# Patient Record
Sex: Female | Born: 1960 | Race: Black or African American | Hispanic: No | Marital: Married | Smoking: Never smoker
Health system: Southern US, Community
[De-identification: ages and names within clinical notes are randomized; demographics above are authoritative.]

## PROBLEM LIST (undated history)

## (undated) DIAGNOSIS — R0602 Shortness of breath: Secondary | ICD-10-CM

## (undated) DIAGNOSIS — R42 Dizziness and giddiness: Secondary | ICD-10-CM

## (undated) DIAGNOSIS — R002 Palpitations: Secondary | ICD-10-CM

## (undated) DIAGNOSIS — T7840XA Allergy, unspecified, initial encounter: Secondary | ICD-10-CM

## (undated) DIAGNOSIS — S060XAA Concussion with loss of consciousness status unknown, initial encounter: Secondary | ICD-10-CM

## (undated) DIAGNOSIS — G43909 Migraine, unspecified, not intractable, without status migrainosus: Secondary | ICD-10-CM

## (undated) DIAGNOSIS — R7303 Prediabetes: Secondary | ICD-10-CM

## (undated) DIAGNOSIS — E559 Vitamin D deficiency, unspecified: Secondary | ICD-10-CM

## (undated) DIAGNOSIS — R569 Unspecified convulsions: Secondary | ICD-10-CM

## (undated) DIAGNOSIS — G40909 Epilepsy, unspecified, not intractable, without status epilepticus: Secondary | ICD-10-CM

## (undated) DIAGNOSIS — M549 Dorsalgia, unspecified: Secondary | ICD-10-CM

## (undated) DIAGNOSIS — K635 Polyp of colon: Secondary | ICD-10-CM

## (undated) DIAGNOSIS — S060X9A Concussion with loss of consciousness of unspecified duration, initial encounter: Secondary | ICD-10-CM

## (undated) DIAGNOSIS — R600 Localized edema: Secondary | ICD-10-CM

## (undated) DIAGNOSIS — Z9289 Personal history of other medical treatment: Secondary | ICD-10-CM

## (undated) DIAGNOSIS — I10 Essential (primary) hypertension: Secondary | ICD-10-CM

## (undated) DIAGNOSIS — O99350 Diseases of the nervous system complicating pregnancy, unspecified trimester: Secondary | ICD-10-CM

## (undated) DIAGNOSIS — F419 Anxiety disorder, unspecified: Secondary | ICD-10-CM

## (undated) DIAGNOSIS — G4730 Sleep apnea, unspecified: Secondary | ICD-10-CM

## (undated) DIAGNOSIS — J302 Other seasonal allergic rhinitis: Secondary | ICD-10-CM

## (undated) DIAGNOSIS — R51 Headache: Secondary | ICD-10-CM

## (undated) DIAGNOSIS — B019 Varicella without complication: Secondary | ICD-10-CM

## (undated) DIAGNOSIS — G4733 Obstructive sleep apnea (adult) (pediatric): Secondary | ICD-10-CM

## (undated) DIAGNOSIS — R519 Headache, unspecified: Secondary | ICD-10-CM

## (undated) DIAGNOSIS — K219 Gastro-esophageal reflux disease without esophagitis: Secondary | ICD-10-CM

## (undated) DIAGNOSIS — R1310 Dysphagia, unspecified: Secondary | ICD-10-CM

## (undated) DIAGNOSIS — K5900 Constipation, unspecified: Secondary | ICD-10-CM

## (undated) DIAGNOSIS — O1490 Unspecified pre-eclampsia, unspecified trimester: Secondary | ICD-10-CM

## (undated) HISTORY — DX: Localized edema: R60.0

## (undated) HISTORY — DX: Vitamin D deficiency, unspecified: E55.9

## (undated) HISTORY — PX: TUBAL LIGATION: SHX77

## (undated) HISTORY — DX: Concussion with loss of consciousness status unknown, initial encounter: S06.0XAA

## (undated) HISTORY — DX: Headache, unspecified: R51.9

## (undated) HISTORY — DX: Other seasonal allergic rhinitis: J30.2

## (undated) HISTORY — PX: COLONOSCOPY: SHX174

## (undated) HISTORY — DX: Gastro-esophageal reflux disease without esophagitis: K21.9

## (undated) HISTORY — DX: Headache: R51

## (undated) HISTORY — DX: Diseases of the nervous system complicating pregnancy, unspecified trimester: O99.350

## (undated) HISTORY — DX: Dizziness and giddiness: R42

## (undated) HISTORY — DX: Migraine, unspecified, not intractable, without status migrainosus: G43.909

## (undated) HISTORY — DX: Anxiety disorder, unspecified: F41.9

## (undated) HISTORY — DX: Epilepsy, unspecified, not intractable, without status epilepticus: G40.909

## (undated) HISTORY — DX: Personal history of other medical treatment: Z92.89

## (undated) HISTORY — DX: Varicella without complication: B01.9

## (undated) HISTORY — PX: POLYPECTOMY: SHX149

## (undated) HISTORY — DX: Unspecified pre-eclampsia, unspecified trimester: O14.90

## (undated) HISTORY — DX: Allergy, unspecified, initial encounter: T78.40XA

## (undated) HISTORY — PX: TONSILLECTOMY: SHX5217

## (undated) HISTORY — DX: Dysphagia, unspecified: R13.10

## (undated) HISTORY — DX: Shortness of breath: R06.02

## (undated) HISTORY — DX: Essential (primary) hypertension: I10

## (undated) HISTORY — DX: Polyp of colon: K63.5

## (undated) HISTORY — DX: Unspecified convulsions: R56.9

## (undated) HISTORY — DX: Concussion with loss of consciousness of unspecified duration, initial encounter: S06.0X9A

## (undated) HISTORY — DX: Constipation, unspecified: K59.00

## (undated) HISTORY — DX: Palpitations: R00.2

## (undated) HISTORY — DX: Sleep apnea, unspecified: G47.30

## (undated) HISTORY — DX: Dorsalgia, unspecified: M54.9

## (undated) HISTORY — DX: Prediabetes: R73.03

## (undated) HISTORY — PX: TONSILLECTOMY: SUR1361

---

## 2009-01-28 LAB — HM COLONOSCOPY

## 2010-11-09 ENCOUNTER — Other Ambulatory Visit (HOSPITAL_COMMUNITY)
Admission: RE | Admit: 2010-11-09 | Discharge: 2010-11-09 | Disposition: A | Discharge status: Home / Self Care | Payer: PRIVATE HEALTH INSURANCE | Source: Ambulatory Visit | Attending: Obstetrics and Gynecology | Admitting: Obstetrics and Gynecology

## 2010-11-09 DIAGNOSIS — Z01419 Encounter for gynecological examination (general) (routine) without abnormal findings: Secondary | ICD-10-CM | POA: Insufficient documentation

## 2010-11-09 DIAGNOSIS — Z1159 Encounter for screening for other viral diseases: Secondary | ICD-10-CM | POA: Insufficient documentation

## 2011-01-06 ENCOUNTER — Other Ambulatory Visit: Payer: Self-pay | Admitting: Family Medicine

## 2011-01-06 DIAGNOSIS — Z1231 Encounter for screening mammogram for malignant neoplasm of breast: Secondary | ICD-10-CM

## 2011-01-13 ENCOUNTER — Ambulatory Visit
Admission: RE | Admit: 2011-01-13 | Discharge: 2011-01-13 | Disposition: A | Discharge status: Home / Self Care | Payer: PRIVATE HEALTH INSURANCE | Source: Ambulatory Visit | Attending: Family Medicine | Admitting: Family Medicine

## 2011-01-13 DIAGNOSIS — Z1231 Encounter for screening mammogram for malignant neoplasm of breast: Secondary | ICD-10-CM

## 2011-11-22 ENCOUNTER — Other Ambulatory Visit: Payer: Self-pay | Admitting: Obstetrics and Gynecology

## 2011-11-22 DIAGNOSIS — Z1231 Encounter for screening mammogram for malignant neoplasm of breast: Secondary | ICD-10-CM

## 2012-01-15 ENCOUNTER — Ambulatory Visit
Admission: RE | Admit: 2012-01-15 | Discharge: 2012-01-15 | Disposition: A | Discharge status: Home / Self Care | Payer: PRIVATE HEALTH INSURANCE | Source: Ambulatory Visit | Attending: Obstetrics and Gynecology | Admitting: Obstetrics and Gynecology

## 2012-01-15 DIAGNOSIS — Z1231 Encounter for screening mammogram for malignant neoplasm of breast: Secondary | ICD-10-CM

## 2012-10-09 ENCOUNTER — Other Ambulatory Visit: Payer: Self-pay | Admitting: Sports Medicine

## 2012-10-09 ENCOUNTER — Ambulatory Visit
Admission: RE | Admit: 2012-10-09 | Discharge: 2012-10-09 | Disposition: A | Discharge status: Home / Self Care | Payer: BC Managed Care – PPO | Source: Ambulatory Visit | Attending: Sports Medicine | Admitting: Sports Medicine

## 2012-10-09 DIAGNOSIS — R51 Headache: Secondary | ICD-10-CM

## 2012-10-28 LAB — HM MAMMOGRAPHY: HM Mammogram: NORMAL

## 2012-10-28 LAB — HM PAP SMEAR: HM Pap smear: NORMAL

## 2012-11-04 ENCOUNTER — Other Ambulatory Visit: Payer: Self-pay | Admitting: Neurology

## 2012-11-04 DIAGNOSIS — M542 Cervicalgia: Secondary | ICD-10-CM

## 2012-11-04 DIAGNOSIS — S0993XA Unspecified injury of face, initial encounter: Secondary | ICD-10-CM

## 2012-11-04 DIAGNOSIS — R200 Anesthesia of skin: Secondary | ICD-10-CM

## 2012-11-09 ENCOUNTER — Other Ambulatory Visit: Payer: BC Managed Care – PPO

## 2012-11-15 ENCOUNTER — Telehealth: Payer: Self-pay | Admitting: Internal Medicine

## 2012-11-15 NOTE — Telephone Encounter (Signed)
Pt called stating that Dr Clelia Croft @ [facility] wanted pt to sent up new pt appointment with you. Pt has bcbs Please advise if i can set her up as a new pt.   Offered pt new patient appointment with Dr Lorin Picket in Sept

## 2012-11-15 NOTE — Telephone Encounter (Signed)
Yes. I will see her.

## 2012-11-21 NOTE — Telephone Encounter (Signed)
Appointment 8/12 pt aware

## 2012-11-23 ENCOUNTER — Ambulatory Visit
Admission: RE | Admit: 2012-11-23 | Discharge: 2012-11-23 | Disposition: A | Discharge status: Home / Self Care | Payer: BC Managed Care – PPO | Source: Ambulatory Visit | Attending: Neurology | Admitting: Neurology

## 2012-11-23 DIAGNOSIS — R200 Anesthesia of skin: Secondary | ICD-10-CM

## 2012-11-23 DIAGNOSIS — M542 Cervicalgia: Secondary | ICD-10-CM

## 2012-11-23 DIAGNOSIS — S0993XA Unspecified injury of face, initial encounter: Secondary | ICD-10-CM

## 2012-12-02 ENCOUNTER — Other Ambulatory Visit: Payer: Self-pay

## 2012-12-02 DIAGNOSIS — Z1231 Encounter for screening mammogram for malignant neoplasm of breast: Secondary | ICD-10-CM

## 2013-01-15 ENCOUNTER — Ambulatory Visit
Admission: RE | Admit: 2013-01-15 | Discharge: 2013-01-15 | Disposition: A | Discharge status: Home / Self Care | Payer: BC Managed Care – PPO | Source: Ambulatory Visit

## 2013-01-15 DIAGNOSIS — Z1231 Encounter for screening mammogram for malignant neoplasm of breast: Secondary | ICD-10-CM

## 2013-01-19 LAB — HM COLONOSCOPY

## 2013-01-21 ENCOUNTER — Ambulatory Visit: Payer: BC Managed Care – PPO | Admitting: Internal Medicine

## 2013-01-28 ENCOUNTER — Encounter: Payer: Self-pay | Admitting: Internal Medicine

## 2013-01-28 ENCOUNTER — Ambulatory Visit (INDEPENDENT_AMBULATORY_CARE_PROVIDER_SITE_OTHER): Payer: BC Managed Care – PPO | Admitting: Internal Medicine

## 2013-01-28 VITALS — BP 138/82 | HR 75 | Temp 98.4°F | Resp 12 | Ht 65.0 in | Wt 184.0 lb

## 2013-01-28 DIAGNOSIS — G44309 Post-traumatic headache, unspecified, not intractable: Secondary | ICD-10-CM

## 2013-01-28 DIAGNOSIS — IMO0001 Reserved for inherently not codable concepts without codable children: Secondary | ICD-10-CM

## 2013-01-28 DIAGNOSIS — S0990XS Unspecified injury of head, sequela: Secondary | ICD-10-CM | POA: Insufficient documentation

## 2013-01-28 DIAGNOSIS — I10 Essential (primary) hypertension: Secondary | ICD-10-CM

## 2013-01-28 DIAGNOSIS — N762 Acute vulvitis: Secondary | ICD-10-CM | POA: Insufficient documentation

## 2013-01-28 DIAGNOSIS — N760 Acute vaginitis: Secondary | ICD-10-CM

## 2013-01-28 DIAGNOSIS — R002 Palpitations: Secondary | ICD-10-CM

## 2013-01-28 DIAGNOSIS — M25559 Pain in unspecified hip: Secondary | ICD-10-CM | POA: Insufficient documentation

## 2013-01-28 MED ORDER — FLUCONAZOLE 150 MG PO TABS: 150.0000 mg | ORAL_TABLET | Freq: Every day | ORAL | Status: DC

## 2013-01-28 MED ORDER — NYSTATIN 100000 UNIT/GM EX CREA: TOPICAL_CREAM | Freq: Two times a day (BID) | CUTANEOUS | Status: DC

## 2013-01-28 MED ORDER — ATENOLOL 25 MG PO TABS: 25.0000 mg | ORAL_TABLET | Freq: Two times a day (BID) | ORAL | Status: DC

## 2013-01-28 NOTE — Assessment & Plan Note (Signed)
Persistent, with a FH od early CAD (father age 54 had a massive MI).  Screening lipidsordered,  No prior cardiology evaluation. Refer to [facility] Cardiology.

## 2013-01-28 NOTE — Assessment & Plan Note (Signed)
She is requesting refill on diflucan and nystatin for symptoms of white discharge and inguinal irritation that have been present for several days.

## 2013-01-28 NOTE — Progress Notes (Signed)
Patient ID: Nicole Gould, female   DOB: [birth year], 52 y.o.   MRN: [id number]   Patient Active Problem List   Diagnosis Date Noted  . Pain in joint, pelvic region and thigh 01/28/2013  . Essential hypertension, benign 01/28/2013  . Vaginitis and vulvovaginitis 01/28/2013  . Palpitations 01/28/2013  . 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 Establish Care    HPI:   Nicole Gould is a 52 y.o. female who presents as a new patient to establish primary care with the chief complaint of  Headaches.  She has a history of concussion in April and was evaluated by Dr. Sherryll Burger at [facility].  The injury occurred when she fell out of a chair and a metal bar on chair hit her in the back of head   No LOC. Has been taking NSAIDs  Including a lot of  Aleve,  Then tylenol,  Then advil. For persistnet bilateral headaches that were stabbing in nature.  Less frequent now,  Seeing a headache specialist in Sept at [facility],   History of recurrent palpitations managed with atenolol.  FH of CAD in father .  holter monitor was negative ,  Events never occurred,  no syncope bur occasional sob.  No change with caffeine restriction  Stiff joints brought on by sitting for prolonged periods of time.,  Vit d was low and treated but still present..   Hips,  Are the worst.   Bilateral index finger swelling and stiff , periodic.  X rays were normal  Seasonal chapped lips that do not respond to blistex  ,  Occurs every fall/winter,  Worse when she wakes up in the morning.       Past Medical History  Diagnosis Date  . Chicken pox   . Frequent headaches   . GERD (gastroesophageal reflux disease)   . Hypertension   . Seizure disorder in pregnancy   . Colon polyps   . Migraines     Past Surgical History  Procedure Laterality Date  . Tonsillectomy    . Tubal ligation      Family History  Problem Relation Age of Onset  . Hypertension  Mother   . Diabetes Mother   . Heart disease Father   . Kidney disease Father   . Hypertension Father   . Diabetes Father   . Cancer Maternal Grandfather     History   Social History  . Marital Status: Married    Spouse Name: N/A    Number of Children: N/A  . Years of Education: N/A   Occupational History  . Not on file.   Social History Main Topics  . Smoking status: Never Smoker   . Smokeless tobacco: Never Used  . Alcohol Use: Yes  . Drug Use: No  . Sexually Active: Yes -- Female partner(s)   Other Topics Concern  . Not on file   Social History Narrative  . No narrative on file    No Known Allergies  Review of Systems:  Patient denies headache, fevers, malaise, unintentional weight loss, skin rash, eye pain, sinus congestion and sinus pain, sore throat, dysphagia,  hemoptysis , cough, dyspnea, wheezing,  orthopnea, edema, abdominal pain, nausea, melena, diarrhea, constipation, flank pain, dysuria, hematuria, urinary  Frequency, nocturia, numbness, tingling, seizures,  Focal weakness, Loss of consciousness,  Tremor, insomnia, depression, anxiety, and suicidal ideation.       Objective:  BP 138/82  Pulse 75  Temp(Src) 98.4 F (36.9 C) (Oral)  Resp 12  Ht 5\' 5"  (1.651 m)  Wt 184 lb (83.462 kg)  BMI 30.62 kg/m2  SpO2 99%  LMP 01/08/2013  General appearance: alert, cooperative and appears stated age Ears: normal TM's and external ear canals both ears Throat: lips, mucosa, and tongue normal; teeth and gums normal Neck: no adenopathy, no carotid bruit, supple, symmetrical, trachea midline and thyroid not enlarged, symmetric, no tenderness/mass/nodules Back: symmetric, no curvature. ROM normal. No CVA tenderness. Lungs: clear to auscultation bilaterally Heart: regular rate and rhythm, S1, S2 normal, no murmur, click, rub or gallop Abdomen: soft, non-tender; bowel sounds normal; no masses,  no organomegaly Pulses: 2+ and symmetric Skin: Skin color, texture,  turgor normal. No rashes or lesions Lymph nodes: Cervical, supraclavicular, and axillary nodes normal.  Assessment and Plan:  Vaginitis and vulvovaginitis She is requesting refill on diflucan and nystatin for symptoms of white discharge and inguinal irritation that have been present for several days.    Essential hypertension, benign Stopping hctz due to recent notification by Dr Kenna Gilbert office that her Cr was elevated.  Increase atenolol to bid.   Pain in joint, pelvic region and thigh Checking serologies for rheumatoid and other inflammatory arthropathies  Palpitations Persistent, with a FH od early CAD (father age 31 had a massive MI).  Screening lipidsordered,  No prior cardiology evaluation. Refer to [facility] Cardiology.    Updated Medication List Outpatient Encounter Prescriptions as of 01/28/2013  Medication Sig Dispense Refill  . atenolol (TENORMIN) 25 MG tablet Take 1 tablet (25 mg total) by mouth 2 (two) times daily.  180 tablet  3  . Cholecalciferol (VITAMIN D-3) 1000 UNITS CAPS Take 1 capsule by mouth daily.      . Multiple Vitamins-Minerals (MULTIVITAMIN WITH MINERALS) tablet Take 1 tablet by mouth daily.      . [DISCONTINUED] atenolol (TENORMIN) 25 MG tablet Take 1 tablet by mouth daily.      . [DISCONTINUED] hydrochlorothiazide (HYDRODIURIL) 25 MG tablet Take 0.5 tablets by mouth daily.      . fluconazole (DIFLUCAN) 150 MG tablet Take 1 tablet (150 mg total) by mouth daily.  2 tablet  0  . nystatin cream (MYCOSTATIN) Apply topically 2 (two) times daily.  30 g  0   No facility-administered encounter medications on file as of 01/28/2013.     Orders Placed This Encounter  Procedures  . HM MAMMOGRAPHY  . HM PAP SMEAR  . Ambulatory referral to Cardiology  . HM COLONOSCOPY    No Follow-up on file.

## 2013-01-28 NOTE — Assessment & Plan Note (Signed)
Stopping hctz due to recent notification by Dr Kenna Gilbert office that her Cr was elevated.  Increase atenolol to bid.

## 2013-01-28 NOTE — Assessment & Plan Note (Signed)
Checking serologies for rheumatoid and other inflammatory arthropathies

## 2013-01-28 NOTE — Patient Instructions (Addendum)
Until we find out what's going on with your kidneys,  Stop the hctz and stop aleve, motrin for now.   If you need to increase the atenolol to twice daily (every 12 hours)   We will have you  get  fasting bloodwork  At [facility] prior to your next visit

## 2013-02-03 LAB — LIPID PANEL
HDL: 49 mg/dL (ref 35–70)
Triglycerides: 713 mg/dL — AB (ref 40–160)

## 2013-02-03 LAB — HEPATIC FUNCTION PANEL: AST: 26 U/L (ref 13–35)

## 2013-02-03 LAB — C-REACTIVE PROTEIN: CRP: 0.5 mg/dL

## 2013-02-03 LAB — BASIC METABOLIC PANEL
BUN: 13 mg/dL (ref 4–21)
Glucose: 93 mg/dL
Potassium: 4 mmol/L (ref 3.4–5.3)
Sodium: 142 mmol/L (ref 137–147)

## 2013-02-03 LAB — CBC AND DIFFERENTIAL
Platelets: 179 10*3/uL (ref 150–399)
WBC: 3.3 10^3/mL

## 2013-02-07 ENCOUNTER — Telehealth: Payer: Self-pay | Admitting: Internal Medicine

## 2013-02-07 NOTE — Telephone Encounter (Signed)
No labs noted in chart at this time.

## 2013-02-07 NOTE — Telephone Encounter (Signed)
The patient is calling wanting the results of her labs that were drawn at [facility] . They told her they faxed the labs over a couple of days ago.

## 2013-02-13 NOTE — Telephone Encounter (Signed)
Pt calling asking for results of previous labs from [place].  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

## 2013-02-13 NOTE — Telephone Encounter (Signed)
Labs scanned in anything I need to advise patient of, please advise.

## 2013-02-14 NOTE — Telephone Encounter (Signed)
Your cholesterol, liver, thyroid and kidney function are normal.  The additional tests for arthritis are incomplete. One screening test was positive , the "ANA" , and when that happens it gets sent for further analysis and  I am waiting for the additional results    [place]

## 2013-02-14 NOTE — Telephone Encounter (Signed)
Pt.notified

## 2013-02-20 ENCOUNTER — Ambulatory Visit: Payer: BC Managed Care – PPO | Admitting: Cardiovascular Disease

## 2013-03-05 ENCOUNTER — Telehealth: Payer: Self-pay | Admitting: Internal Medicine

## 2013-03-05 ENCOUNTER — Ambulatory Visit: Payer: BC Managed Care – PPO | Admitting: Cardiovascular Disease

## 2013-03-05 NOTE — Telephone Encounter (Signed)
The patient has taken her last pill today.  atenolol (TENORMIN) 25 MG tablet  #90

## 2013-03-05 NOTE — Telephone Encounter (Signed)
Spoke with pt, advised refills available at pharmacy.

## 2013-03-06 ENCOUNTER — Telehealth: Payer: Self-pay | Admitting: Internal Medicine

## 2013-03-06 NOTE — Telephone Encounter (Signed)
She has a positive ANA titer but she has no evidence of inflammation based on her sedimentation rate and her C-reactive protein.I recommend that she SEE  rheumatologist if she continues to have joint pain. Would she like a referral?

## 2013-03-11 DIAGNOSIS — R52 Pain, unspecified: Secondary | ICD-10-CM | POA: Insufficient documentation

## 2013-03-14 ENCOUNTER — Ambulatory Visit (INDEPENDENT_AMBULATORY_CARE_PROVIDER_SITE_OTHER): Payer: BC Managed Care – PPO | Admitting: Cardiovascular Disease

## 2013-03-14 ENCOUNTER — Encounter: Payer: Self-pay | Admitting: Cardiovascular Disease

## 2013-03-14 VITALS — BP 120/82 | HR 68 | Ht 64.0 in | Wt 182.5 lb

## 2013-03-14 DIAGNOSIS — R0602 Shortness of breath: Secondary | ICD-10-CM

## 2013-03-14 DIAGNOSIS — Z0000 Encounter for general adult medical examination without abnormal findings: Secondary | ICD-10-CM

## 2013-03-14 DIAGNOSIS — R002 Palpitations: Secondary | ICD-10-CM

## 2013-03-14 DIAGNOSIS — I10 Essential (primary) hypertension: Secondary | ICD-10-CM

## 2013-03-14 NOTE — Assessment & Plan Note (Signed)
We discussed her cholesterol with her. She does have a family history both parents were smokers. She has never smoked, no diabetes. She does not want a cholesterol medication and with cholesterol 186, we have recommended improved diet, exercise, red yeast rice if she would like improved cholesterol numbers.

## 2013-03-14 NOTE — Assessment & Plan Note (Addendum)
We have tried to reassure her about her palpitations. Likely is having APCs or PVCs. Currently not very symptomatic on atenolol 25 mg daily. I suggested she take extra atenolol if she is symptomatic in terms of her palpitations. No further monitors are needed unless she becomes more symptomatic. She does have vague chest symptoms when she has palpitations, likely not secondary to ischemia.

## 2013-03-14 NOTE — Assessment & Plan Note (Signed)
No evidence of renal dysfunction as he was told by her GI office. She's not using NSAIDs but certainly could use NSAIDs if indicated given normal creatinine of the past several months. She'll stay on atenolol 25 mg daily (she did not increase this to 25 mg twice a day as suggested). She has been taking HCTZ 12.5 mg periodically with good blood pressure control. She would like to try this every other day. We have asked her to monitor her blood pressures. Currently with no edema. He did mention that potassium was borderline low and she will increase her potassium intake when taking HCTZ.

## 2013-03-14 NOTE — Patient Instructions (Addendum)
You are doing well. No medication changes were made.  Try MOM for constipation, miralex Try Red Yeast Rice for cholesterol, 1 to 4 a day  Please call us if you have new issues that need to be addressed before your next appt.

## 2013-03-14 NOTE — Progress Notes (Signed)
Patient ID: Nicole Gould, female    DOB: [birth year], 52 y.o.   MRN: [id number]  HPI Comments: Nicole Gould is a very pleasant 52 year old woman with history of palpitations, hypertension who presents for evaluation of her symptoms, occasional chest tightness, and management of her medications.  She reports having a long history of palpitations dating back many years. Previous monitors revealed ectopy by her report. She is confused as to what this means. She has had a long stretch since July 2014 with no significant ectopy reports having some last night. Ectopy seems to happen during stressful periods. She describes it as a fluttering in her chest. Atenolol has helped her symptoms.  She was told recently by her GI office that she had renal dysfunction from using NSAIDs. She brings in labs with her today. Creatinine is less than 1 on all recent lab work including recent followup lab work in August 2014. She is confused why she was told that she has renal dysfunction.  Reports having a Myoview in 2005 for ectopy that was reportedly normal. She attributes her symptoms to menopause. Menopause symptoms have been getting worse over the past several years. Now with sweating, occasional palpitations, malaise.  She does report having stiff legs in the morning when she is cold, better when they warm up  history of concussion in April 2014 and was evaluated by Dr. Sherryll Burger at [place].  The injury occurred when she fell out of a chair and a metal bar on chair hit her in the back of head    Seeing a headache specialist in at [facility]  FH of CAD in father . Cholesterol 186, LDL 122 in August 2014 EKG today shows normal sinus rhythm with rate 68 beats per minute, no significant ST or T wave changes   Outpatient Encounter Prescriptions as of 03/14/2013  Medication Sig Dispense Refill  . atenolol (TENORMIN) 25 MG tablet Take 1 tablet (25 mg total) by mouth 2 (two) times daily.  180 tablet  3  .  Cholecalciferol (VITAMIN D-3) 1000 UNITS CAPS Take 1 capsule by mouth daily.      . hydrochlorothiazide (HYDRODIURIL) 25 MG tablet Take 12.5 mg by mouth daily.      . Multiple Vitamins-Minerals (MULTIVITAMIN WITH MINERALS) tablet Take 1 tablet by mouth daily.      Marland Kitchen nystatin cream (MYCOSTATIN) Apply topically 2 (two) times daily.  30 g  0  . [DISCONTINUED] fluconazole (DIFLUCAN) 150 MG tablet Take 1 tablet (150 mg total) by mouth daily.  2 tablet  0   No facility-administered encounter medications on file as of 03/14/2013.     Review of Systems  Constitutional: Negative.   HENT: Negative.   Eyes: Negative.   Respiratory: Negative.   Cardiovascular: Negative.   Gastrointestinal: Negative.   Endocrine: Negative.   Musculoskeletal: Negative.   Skin: Negative.   Allergic/Immunologic: Negative.   Neurological: Negative.   Hematological: Negative.   Psychiatric/Behavioral: Negative.   All other systems reviewed and are negative.    BP 120/82  Pulse 68  Ht 5\' 4"  (1.626 m)  Wt 182 lb 8 oz (82.781 kg)  BMI 31.31 kg/m2  Physical Exam  Nursing note and vitals reviewed. Constitutional: She is oriented to person, place, and time. She appears well-developed and well-nourished.  HENT:  Head: Normocephalic.  Nose: Nose normal.  Mouth/Throat: Oropharynx is clear and moist.  Eyes: Conjunctivae are normal. Pupils are equal, round, and reactive to light.  Neck: Normal range of motion. Neck supple.  No JVD present.  Cardiovascular: Normal rate, regular rhythm, S1 normal, S2 normal, normal heart sounds and intact distal pulses.  Exam reveals no gallop and no friction rub.   No murmur heard. Pulmonary/Chest: Effort normal and breath sounds normal. No respiratory distress. She has no wheezes. She has no rales. She exhibits no tenderness.  Abdominal: Soft. Bowel sounds are normal. She exhibits no distension. There is no tenderness.  Musculoskeletal: Normal range of motion. She exhibits no edema  and no tenderness.  Lymphadenopathy:    She has no cervical adenopathy.  Neurological: She is alert and oriented to person, place, and time. Coordination normal.  Skin: Skin is warm and dry. No rash noted. No erythema.  Psychiatric: She has a normal mood and affect. Her behavior is normal. Judgment and thought content normal.    Assessment and Plan

## 2013-03-18 ENCOUNTER — Institutional Professional Consult (permissible substitution): Payer: BC Managed Care – PPO | Admitting: Cardiology

## 2013-03-19 ENCOUNTER — Telehealth: Payer: Self-pay | Admitting: Internal Medicine

## 2013-03-19 DIAGNOSIS — R1310 Dysphagia, unspecified: Secondary | ICD-10-CM

## 2013-03-19 DIAGNOSIS — Z1211 Encounter for screening for malignant neoplasm of colon: Secondary | ICD-10-CM

## 2013-03-19 NOTE — Addendum Note (Signed)
Addended by: Sherlene Shams on: 03/19/2013 05:48 PM   Modules accepted: Orders

## 2013-03-19 NOTE — Telephone Encounter (Signed)
Pt would like referral for colonoscopy.  Pt would like to know who Dr. Darrick Huntsman recommends.  Pt is new to [facility] and would like a really good gastroenterologist.  Asking if it is possible to have this done on a Saturday.  If not, Fridays are good.  Also wants to let Dr. Darrick Huntsman know she would like an upper endoscopy.  Forgot to mention that she sometimes has a little trouble swallowing.  States has been dealing with this for a long time.  Asking colonoscopy and endoscopy can be done at the same time.

## 2013-03-19 NOTE — Telephone Encounter (Signed)
Left message to call office

## 2013-03-19 NOTE — Telephone Encounter (Signed)
Referral is in process as requested to Erick Blinks at [facility].

## 2013-03-20 ENCOUNTER — Telehealth: Payer: Self-pay | Admitting: Internal Medicine

## 2013-03-20 ENCOUNTER — Encounter: Payer: Self-pay | Admitting: Internal Medicine

## 2013-03-20 NOTE — Telephone Encounter (Signed)
Pt called wanting you to call her as close to 5 as possible

## 2013-03-20 NOTE — Telephone Encounter (Signed)
Amber called and left VM for pt regarding GI appt

## 2013-03-25 NOTE — Telephone Encounter (Signed)
Talked with patient labs given

## 2013-05-09 ENCOUNTER — Ambulatory Visit: Payer: BC Managed Care – PPO | Admitting: Internal Medicine

## 2013-05-12 ENCOUNTER — Encounter: Payer: Self-pay | Admitting: Internal Medicine

## 2013-05-12 ENCOUNTER — Ambulatory Visit (INDEPENDENT_AMBULATORY_CARE_PROVIDER_SITE_OTHER): Payer: BC Managed Care – PPO | Admitting: Internal Medicine

## 2013-05-12 VITALS — BP 152/94 | HR 80 | Ht 64.0 in | Wt 186.0 lb

## 2013-05-12 DIAGNOSIS — K219 Gastro-esophageal reflux disease without esophagitis: Secondary | ICD-10-CM

## 2013-05-12 DIAGNOSIS — D126 Benign neoplasm of colon, unspecified: Secondary | ICD-10-CM | POA: Insufficient documentation

## 2013-05-12 DIAGNOSIS — Z860100 Personal history of colon polyps, unspecified: Secondary | ICD-10-CM

## 2013-05-12 DIAGNOSIS — Z1211 Encounter for screening for malignant neoplasm of colon: Secondary | ICD-10-CM

## 2013-05-12 DIAGNOSIS — R1310 Dysphagia, unspecified: Secondary | ICD-10-CM

## 2013-05-12 MED ORDER — MOVIPREP 100 G PO SOLR: ORAL | Status: DC

## 2013-05-12 NOTE — Patient Instructions (Signed)
You have been scheduled for a colonoscopy/endoscopy with propofol. Please follow written instructions given to you at your visit today.  Please pick up your prep kit at the pharmacy within the next 1-3 days. If you use inhalers (even only as needed), please bring them with you on the day of your procedure. Your physician has requested that you go to www.startemmi.com and enter the access code given to you at your visit today. This web site gives a general overview about your procedure. However, you should still follow specific instructions given to you by our office regarding your preparation for the procedure.                                                We are excited to introduce MyChart, a new best-in-class service that provides you online access to important information in your electronic medical record. We want to make it easier for you to view your health information - all in one secure location - when and where you need it. We expect MyChart will enhance the quality of care and service we provide.  When you register for MyChart, you can:    View your test results.    Request appointments and receive appointment reminders via email.    Request medication renewals.    View your medical history, allergies, medications and immunizations.    Communicate with your physician's office through a password-protected site.    Conveniently print information such as your medication lists.  To find out if MyChart is right for you, please talk to a member of our clinical staff today. We will gladly answer your questions about this free health and wellness tool.  If you are age 89 or older and want a member of your family to have access to your record, you must provide written consent by completing a proxy form available at our office. Please speak to our clinical staff about guidelines regarding accounts for patients younger than age 59.  As you activate your MyChart account and need any  technical assistance, please call the MyChart technical support line at [phone]) or email your question to [email or web address]. If you email your question(s), please include your name, a return phone number and the best time to reach you.  If you have non-urgent health-related questions, you can send a message to our office through MyChart at [email or web address]. If you have a medical emergency, call 911.  Thank you for using MyChart as your new health and wellness resource!   MyChart licensed from [employer],  4540-9811. Patents Pending.

## 2013-05-12 NOTE — Progress Notes (Signed)
Patient ID: Nicole Gould, female   DOB: 27-Mar-1961, 52 y.o.   MRN: [id number] HPI: Nicole Gould is a 52 year old female with a past medical history of colon polyps, GERD, hypertension, and migraines who is seen in consultation at the request of Dr. Darrick Huntsman to evaluate for repeat surveillance colonoscopy in dysphagia. She is here alone today. She reports she had a screening colonoscopy performed approximately 5 years ago with 1 polyp removed. It was recommended she have a repeat surveillance test in 5 years. She denies a change in bowel habit. No rectal bleeding or melena. She reports normal healthy bowel movements as long as she maintains a high fiber diet. She can get constipated if she travels were dramatically changes her diet. When this happens she has responded to an over-the-counter herbal laxative. No abdominal pain. She does have a history of heartburn and took Prilosec for some time. She is no longer taking this now and her heartburn is actually rare at present. No odynophagia, but occasionally she does feel like she has to force down solid foods with liquids. She's never had a food impaction. No nausea or vomiting. Good appetite. No prior EGD.  Past Medical History  Diagnosis Date  . Chicken pox   . Frequent headaches   . GERD (gastroesophageal reflux disease)   . Hypertension   . Seizure disorder in pregnancy   . Colon polyps   . Migraines     Past Surgical History  Procedure Laterality Date  . Tonsillectomy    . Tubal ligation      Current Outpatient Prescriptions  Medication Sig Dispense Refill  . atenolol (TENORMIN) 25 MG tablet Take 1 tablet (25 mg total) by mouth 2 (two) times daily.  180 tablet  3  . Cholecalciferol (VITAMIN D-3) 1000 UNITS CAPS Take 1 capsule by mouth daily.      . hydrochlorothiazide (HYDRODIURIL) 25 MG tablet Take 12.5 mg by mouth daily.      . Multiple Vitamins-Minerals (MULTIVITAMIN WITH MINERALS) tablet Take 1 tablet by mouth daily.      Marland Kitchen nystatin  cream (MYCOSTATIN) Apply topically 2 (two) times daily.  30 g  0  . MOVIPREP 100 G SOLR Use per prep instruction  1 kit  0   No current facility-administered medications for this visit.    No Known Allergies  Family History  Problem Relation Age of Onset  . Hypertension Mother   . Diabetes Mother   . Heart disease Father   . Kidney disease Father   . Hypertension Father   . Diabetes Father   . Cancer Maternal Grandfather     History  Substance Use Topics  . Smoking status: Never Smoker   . Smokeless tobacco: Never Used  . Alcohol Use: Yes    ROS: As per history of present illness, otherwise negative  BP 152/94  Pulse 80  Ht 5\' 4"  (1.626 m)  Wt 186 lb (84.369 kg)  BMI 31.91 kg/m2  LMP 11/17/2012 Constitutional: Well-developed and well-nourished. No distress. HEENT: Normocephalic and atraumatic. Oropharynx is clear and moist. No oropharyngeal exudate. Conjunctivae are normal.  No scleral icterus. Neck: Neck supple. Trachea midline. Cardiovascular: Normal rate, regular rhythm and intact distal pulses. No M/R/G Pulmonary/chest: Effort normal and breath sounds normal. No wheezing, rales or rhonchi. Abdominal: Soft, nontender, nondistended. Bowel sounds active throughout. There are no masses palpable. No hepatosplenomegaly. Extremities: no clubbing, cyanosis, or edema Lymphadenopathy: No cervical adenopathy noted. Neurological: Alert and oriented to person place and time. Skin: Skin is  warm and dry. No rashes noted. Psychiatric: Normal mood and affect. Behavior is normal.  RELEVANT LABS AND IMAGING: CBC    Component Value Date/Time   WBC 3.3 02/03/2013 1305   PLT 179 02/03/2013 1305    CMP     Component Value Date/Time   NA 142 02/03/2013 1305   K 4.0 02/03/2013 1305   BUN 13 02/03/2013 1305   CREATININE 0.8 02/03/2013 1305   AST 26 02/03/2013 1305   ALT 25 02/03/2013 1305   ALKPHOS 54 02/03/2013 1305    ASSESSMENT/PLAN: 52 year old female with a past medical  history of colon polyps, GERD, hypertension, and migraines who is seen in consultation at the request of Dr. Darrick Huntsman to evaluate for repeat surveillance colonoscopy in dysphagia.   1.  Hx of colon polyp -- he spent approximately 5 years since her last screening colonoscopy when one polyp was removed. We will request records from previous colonoscopy. We discussed repeat colonoscopy including the risks and benefits and she is agreeable to proceed. This will be scheduled for her  2.  Mild dysphagia -- given her mild esophageal dysphagia and history of GERD, I recommended upper endoscopy at the same time as her colonoscopy. We discussed the test today including risks and benefits and she is agreeable to proceed. Given her heartburn is minimal at present I do not think she necessarily needs PPI. This may change based on findings at endoscopy

## 2013-05-13 ENCOUNTER — Encounter: Payer: Self-pay | Admitting: Internal Medicine

## 2013-05-21 ENCOUNTER — Encounter: Payer: Self-pay | Admitting: Internal Medicine

## 2013-05-21 ENCOUNTER — Ambulatory Visit (AMBULATORY_SURGERY_CENTER): Payer: BC Managed Care – PPO | Admitting: Internal Medicine

## 2013-05-21 VITALS — BP 138/78 | HR 74 | Temp 97.8°F | Resp 22 | Ht 64.0 in | Wt 186.0 lb

## 2013-05-21 DIAGNOSIS — D131 Benign neoplasm of stomach: Secondary | ICD-10-CM

## 2013-05-21 DIAGNOSIS — R1310 Dysphagia, unspecified: Secondary | ICD-10-CM

## 2013-05-21 DIAGNOSIS — K219 Gastro-esophageal reflux disease without esophagitis: Secondary | ICD-10-CM

## 2013-05-21 DIAGNOSIS — Z1211 Encounter for screening for malignant neoplasm of colon: Secondary | ICD-10-CM

## 2013-05-21 DIAGNOSIS — Z86010 Personal history of colonic polyps: Secondary | ICD-10-CM

## 2013-05-21 DIAGNOSIS — D126 Benign neoplasm of colon, unspecified: Secondary | ICD-10-CM

## 2013-05-21 LAB — HM COLONOSCOPY

## 2013-05-21 MED ORDER — PANTOPRAZOLE SODIUM 40 MG PO TBEC: DELAYED_RELEASE_TABLET | ORAL | Status: DC

## 2013-05-21 MED ORDER — SODIUM CHLORIDE 0.9 % IV SOLN: 500.0000 mL | INTRAVENOUS | Status: DC

## 2013-05-21 NOTE — Op Note (Signed)
[facility] [address] [place], [address]   COLONOSCOPY PROCEDURE REPORT  PATIENT: Nicole Gould, Nicole Gould  MR#: [id number] BIRTHDATE: 1960-08-07 , 52  yrs. old GENDER: Female ENDOSCOPIST: Beverley Fiedler, MD REFERRED NW:GNFAOZ Darrick Huntsman, M.D. PROCEDURE DATE:  05/21/2013 PROCEDURE:   Colonoscopy with snare polypectomy First Screening Colonoscopy - Avg.  risk and is 50 yrs.  old or older - No.  Prior Negative Screening - Now for repeat screening. N/A  History of Adenoma - Now for follow-up colonoscopy & has been > or = to 3 yrs.  Yes hx of adenoma.  Has been 3 or more years since last colonoscopy.  Polyps Removed Today? Yes. ASA CLASS:   Class II INDICATIONS:elevated risk screening, Patient's personal history of adenomatous colon polyps, and Last colonoscopy performed 5 years ago. MEDICATIONS: MAC sedation, administered by CRNA and Propofol (Diprivan) 290 mg IV  DESCRIPTION OF PROCEDURE:   After the risks benefits and alternatives of the procedure were thoroughly explained, informed consent was obtained.  A digital rectal exam revealed no rectal mass.   The LB [id number]  endoscope was introduced through the anus and advanced to the cecum, which was identified by both the appendix and ileocecal valve. No adverse events experienced. The quality of the prep was good, using MoviPrep  The instrument was then slowly withdrawn as the colon was fully examined.   COLON FINDINGS: Two sessile polyps measuring 4 and 6 mm in size were found at the cecum.  Polypectomy was performed using cold snare. All resections were complete and all polyp tissue was completely retrieved.   Three sessile polyps measuring 4-8 mm in size were found in the ascending colon and at the hepatic flexure (1). Polypectomy was performed using cold snare (2) and using hot snare (1).  All resections were complete and all polyp tissue was completely retrieved.  Retroflexed views revealed no  abnormalities. The time to cecum=4 minutes 51 seconds.  Withdrawal time=18 minutes 31 seconds.  The scope was withdrawn and the procedure completed. COMPLICATIONS: There were no complications.  ENDOSCOPIC IMPRESSION: 1.   Two sessile polyps measuring 4 and 6 mm in size were found at the cecum; Polypectomy was performed using cold snare 2.   Three sessile polyps measuring 4-8 mm in size were found in the ascending colon and at the hepatic flexure; Polypectomy was performed using cold snare and using hot snare  RECOMMENDATIONS: 1.  Await pathology results 2.  Hold aspirin, aspirin products, and anti-inflammatory medication for 2 weeks. 3.  Timing of repeat colonoscopy will be determined by pathology findings. 4.  You will receive a letter within 1-2 weeks with the results of your biopsy as well as final recommendations.  Please call my office if you have not received a letter after 3 weeks.   eSigned:  Beverley Fiedler, MD 05/21/2013 3:07 PM   cc: The Patient and Duncan Dull, MD   PATIENT NAME:  Nicole Gould, Nicole Gould MR#: [id number]

## 2013-05-21 NOTE — Progress Notes (Signed)
Called to room to assist during endoscopic procedure.  Patient ID and intended procedure confirmed with present staff. Received instructions for my participation in the procedure from the performing physician. ewm 

## 2013-05-21 NOTE — Progress Notes (Signed)
Report to pacu rn, vss, bbs=clear 

## 2013-05-21 NOTE — Patient Instructions (Addendum)
YOU HAD AN ENDOSCOPIC PROCEDURE TODAY AT THE [facility]: Refer to the procedure report that was given to you for any specific questions about what was found during the examination.  If the procedure report does not answer your questions, please call your gastroenterologist to clarify.  If you requested that your care partner not be given the details of your procedure findings, then the procedure report has been included in a sealed envelope for you to review at your convenience later.  YOU SHOULD EXPECT: Some feelings of bloating in the abdomen. Passage of more gas than usual.  Walking can help get rid of the air that was put into your GI tract during the procedure and reduce the bloating. If you had a lower endoscopy (such as a colonoscopy or flexible sigmoidoscopy) you may notice spotting of blood in your stool or on the toilet paper. If you underwent a bowel prep for your procedure, then you may not have a normal bowel movement for a few days.  DIET: DILATION DIET- SEE HANDOUT  Drink plenty of fluids but you should avoid alcoholic beverages for 24 hours.  ACTIVITY: Your care partner should take you home directly after the procedure.  You should plan to take it easy, moving slowly for the rest of the day.  You can resume normal activity the day after the procedure however you should NOT DRIVE or use heavy machinery for 24 hours (because of the sedation medicines used during the test).    SYMPTOMS TO REPORT IMMEDIATELY: A gastroenterologist can be reached at any hour.  During normal business hours, 8:30 AM to 5:00 PM Monday through Friday, call [phone].  After hours and on weekends, please call the GI answering service at [phone] who will take a message and have the physician on call contact you.   Following lower endoscopy (colonoscopy or flexible sigmoidoscopy):  Excessive amounts of blood in the stool  Significant tenderness or worsening of abdominal pains  Swelling of  the abdomen that is new, acute  Fever of 100F or higher  Following upper endoscopy (EGD)  Vomiting of blood or coffee ground material  New chest pain or pain under the shoulder blades  Painful or persistently difficult swallowing  New shortness of breath  Fever of 100F or higher  Black, tarry-looking stools  FOLLOW UP: If any biopsies were taken you will be contacted by phone or by letter within the next 1-3 weeks.  Call your gastroenterologist if you have not heard about the biopsies in 3 weeks.  Our staff will call the home number listed on your records the next business day following your procedure to check on you and address any questions or concerns that you may have at that time regarding the information given to you following your procedure. This is a courtesy call and so if there is no answer at the home number and we have not heard from you through the emergency physician on call, we will assume that you have returned to your regular daily activities without incident.  SIGNATURES/CONFIDENTIALITY: You and/or your care partner have signed paperwork which will be entered into your electronic medical record.  These signatures attest to the fact that that the information above on your After Visit Summary has been reviewed and is understood.  Full responsibility of the confidentiality of this discharge information lies with you and/or your care-partner.  FOLLOW DILATION DIET- SEE HANDOUT  PLEASE READ HANDOUTS  NO ASPIRIN, ASPIRIN CONTAINING PRODUCTS, NSAIDS (ADVIL, IBUPROFEN,  MOTRIN, ALEVE) FOR 2 WEEKS- TYLENOL IS OK TO TAKE

## 2013-05-21 NOTE — Op Note (Signed)
[facility] [address] [place], [address]   ENDOSCOPY PROCEDURE REPORT  PATIENT: Nicole, Gould  MR#: [id number] BIRTHDATE: [birth year] , 52  yrs. old GENDER: Female ENDOSCOPIST: Beverley Fiedler, MD REFERRED BY:  Duncan Dull, M.D. PROCEDURE DATE:  05/21/2013 PROCEDURE:  EGD w/ biopsy and Savary dilation of esophagus ASA CLASS:     Class II INDICATIONS:  history of GERD.   Dysphagia. MEDICATIONS: MAC sedation, administered by CRNA, Propofol (Diprivan), and Propofol (Diprivan) 230 mg IV TOPICAL ANESTHETIC: Cetacaine Spray  DESCRIPTION OF PROCEDURE: After the risks benefits and alternatives of the procedure were thoroughly explained, informed consent was obtained.  The LB [id number] endoscope was introduced through the mouth and advanced to the second portion of the duodenum. Without limitations.  The instrument was slowly withdrawn as the mucosa was fully examined.     ESOPHAGUS: Subtle mucosal changes that included circumferential folds and longitudinal markings were found in the middle third of the esophagus and lower third of the esophagus, which may represent eosinophilic esophagitis.   No evidence for esophageal stricture or ring.  Biopsies were obtained from the mid and distal esophagus to exclude eosinophilic esophagitis.  Empiric dilation was performed over a guidewire with one pass, savory 17 mm.  Little to no resistance.  STOMACH: Mild gastritis (inflammation) was found in the gastric antrum.  Multiple biopsies were performed using cold forceps. Otherwise normal stomach.  DUODENUM: Mild duodenal inflammation was found in the duodenal bulb. The duodenal mucosa showed no abnormalities in the 2nd part of the duodenum.  Retroflexed views revealed no abnormalities.     The scope was then withdrawn from the patient and the procedure completed.  COMPLICATIONS: There were no complications.  ENDOSCOPIC IMPRESSION: 1.   Possible  eosinophilic esophagitis found in the middle third of the esophagus and lower third of the esophagus; multiple biopsies 2.   Gastritis (inflammation) was found in the gastric antrum; multiple biopsies 3.   Duodenal inflammation was found in the duodenal bulb 4.   The duodenal mucosa showed no abnormalities in the 2nd part of the duodenum  RECOMMENDATIONS: 1.  Await pathology results 2.  Consider starting pantoprazole 40 mg daily  eSigned:  Beverley Fiedler, MD 05/21/2013 3:03 PM   CC:The Patient and Duncan Dull, MD  PATIENT NAME:  Nicole, Gould MR#: [id number]

## 2013-05-21 NOTE — Progress Notes (Signed)
Patient did not experience any of the following events: a burn prior to discharge; a fall within the facility; wrong site/side/patient/procedure/implant event; or a hospital transfer or hospital admission upon discharge from the facility. (G8907) Patient did not have preoperative order for IV antibiotic SSI prophylaxis. (G8918)  

## 2013-05-22 ENCOUNTER — Telehealth: Payer: Self-pay | Admitting: *Deleted

## 2013-05-22 NOTE — Telephone Encounter (Signed)
  Follow up Call-  Call back number 05/21/2013  Post procedure Call Back phone  # [phone]  Permission to leave phone message Yes     Patient questions:  Do you have a fever, pain , or abdominal swelling? no Pain Score  0 *  Have you tolerated food without any problems? yes  Have you been able to return to your normal activities? yes  Do you have any questions about your discharge instructions: Diet   no Medications  no Follow up visit  no  Do you have questions or concerns about your Care? no  Actions: * If pain score is 4 or above: No action needed, pain <4.

## 2013-05-28 ENCOUNTER — Encounter: Payer: Self-pay | Admitting: Internal Medicine

## 2013-06-03 ENCOUNTER — Telehealth: Payer: Self-pay | Admitting: Internal Medicine

## 2013-06-04 NOTE — Telephone Encounter (Signed)
lmom for pt to call back. I was unable to reach her yesterday during her time frame.

## 2013-06-04 NOTE — Telephone Encounter (Signed)
Informed pt of her path results and discussed all items with her. She has not received her letter so I mailed her a new path report and letter. Pt stated understanding.

## 2013-06-04 NOTE — Telephone Encounter (Signed)
Beverley Fiedler, MD Linna Hoff, RN            Yes continue for active GERD      Previous Messages    With pt's permission, lmom that Dr Rhea Belton states she needs to remain on pantoprazole since she has active GERD. She may call back for questions.

## 2013-06-09 ENCOUNTER — Telehealth: Payer: Self-pay | Admitting: Internal Medicine

## 2013-06-09 NOTE — Telephone Encounter (Signed)
Again , discussed path report vs ECL. Pt has a hard time understanding why 5 polyps were removed, but only 4 were pre cancerous. Again mailed the pt the COLON and Path reports.  Dr Rhea Belton, pt would like for you to call her any evening at [phone]. Thanks.

## 2013-06-16 NOTE — Telephone Encounter (Signed)
Message left on voicemail for her to call me back

## 2013-06-20 ENCOUNTER — Telehealth: Payer: Self-pay | Admitting: Internal Medicine

## 2013-06-20 NOTE — Telephone Encounter (Signed)
Patient call back to discuss colonoscopy and pathology results I called her this afternoon at 5:30 pm and got no answer. I left another message on her voicemail to return my call next week

## 2013-06-23 ENCOUNTER — Other Ambulatory Visit: Payer: Self-pay | Admitting: *Deleted

## 2013-06-23 MED ORDER — HYDROCHLOROTHIAZIDE 25 MG PO TABS: 12.5000 mg | ORAL_TABLET | Freq: Every day | ORAL | Status: DC

## 2013-06-23 NOTE — Telephone Encounter (Signed)
01/28/13 visit mentions stopping HCTZ due to labs, but 02/07/13 results say kidney function was normal but I don't see mentioned ok to restart HCTZ. Ok to refill?

## 2013-06-26 ENCOUNTER — Encounter: Payer: Self-pay | Admitting: Internal Medicine

## 2013-06-26 MED ORDER — HYDROCHLOROTHIAZIDE 25 MG PO TABS: 12.5000 mg | ORAL_TABLET | Freq: Every day | ORAL | Status: DC

## 2013-06-26 NOTE — Telephone Encounter (Signed)
Pt has not called back.

## 2013-06-26 NOTE — Addendum Note (Signed)
Addended by: [name] on: 06/26/2013 12:28 PM   Modules accepted: Orders

## 2013-06-26 NOTE — Telephone Encounter (Signed)
Notes reviewed  Ok to refill,  Authorized in epic

## 2013-06-30 ENCOUNTER — Ambulatory Visit: Payer: BC Managed Care – PPO | Admitting: Internal Medicine

## 2013-08-30 ENCOUNTER — Encounter: Payer: Self-pay | Admitting: *Deleted

## 2014-01-26 ENCOUNTER — Other Ambulatory Visit: Payer: Self-pay

## 2014-01-26 DIAGNOSIS — Z1231 Encounter for screening mammogram for malignant neoplasm of breast: Secondary | ICD-10-CM

## 2014-02-04 ENCOUNTER — Ambulatory Visit: Payer: BC Managed Care – PPO

## 2014-02-09 ENCOUNTER — Ambulatory Visit
Admission: RE | Admit: 2014-02-09 | Discharge: 2014-02-09 | Disposition: A | Discharge status: Home / Self Care | Payer: BC Managed Care – PPO | Source: Ambulatory Visit

## 2014-02-09 DIAGNOSIS — Z1231 Encounter for screening mammogram for malignant neoplasm of breast: Secondary | ICD-10-CM

## 2014-02-16 ENCOUNTER — Other Ambulatory Visit: Payer: Self-pay | Admitting: *Deleted

## 2014-02-16 MED ORDER — HYDROCHLOROTHIAZIDE 25 MG PO TABS: 12.5000 mg | ORAL_TABLET | Freq: Every day | ORAL | Status: DC

## 2014-03-07 ENCOUNTER — Other Ambulatory Visit: Payer: Self-pay | Admitting: Internal Medicine

## 2014-03-12 ENCOUNTER — Telehealth: Payer: Self-pay | Admitting: *Deleted

## 2014-03-12 DIAGNOSIS — R5381 Other malaise: Secondary | ICD-10-CM

## 2014-03-12 DIAGNOSIS — R5383 Other fatigue: Principal | ICD-10-CM

## 2014-03-12 DIAGNOSIS — E785 Hyperlipidemia, unspecified: Secondary | ICD-10-CM

## 2014-03-12 NOTE — Telephone Encounter (Signed)
Ok to refill the hctz if she comes in for FASTING labs ,  I'LL enter the labs

## 2014-03-12 NOTE — Telephone Encounter (Signed)
Patient stated she cannot come here for labs could she please have an order for labs to be drawn at her place of employment [employer] patient is to call on Monday with instructions.

## 2014-03-12 NOTE — Telephone Encounter (Signed)
Message copied by Nanci Pina on Thu Mar 12, 2014 11:42 AM ------      Message from: Bud Face F      Created: Wed Mar 11, 2014  5:09 PM      Regarding: Refill request       Needing refills until she can be seen on 11.2.15. Please advise. ------

## 2014-03-12 NOTE — Telephone Encounter (Signed)
Patient has an appointment scheduled 04/20/14 for medication refills if patient will come in for labs can I refill HCTZ for patient?

## 2014-03-13 NOTE — Telephone Encounter (Signed)
Lab letter on printer

## 2014-03-15 ENCOUNTER — Other Ambulatory Visit: Payer: Self-pay | Admitting: Internal Medicine

## 2014-03-16 ENCOUNTER — Ambulatory Visit: Payer: BC Managed Care – PPO | Admitting: Internal Medicine

## 2014-03-16 NOTE — Telephone Encounter (Signed)
Appt 04/20/14

## 2014-03-17 NOTE — Telephone Encounter (Signed)
Lab order faxed to patient as requested and patient notified by phone. Order faxed to [phone] and patient notified at [phone].

## 2014-03-17 NOTE — Telephone Encounter (Signed)
I have lab order for patient just need lab fax, to complete labs for patients medication refill, Patient was to return call with fax number an advise nurse of where labs were to be drawn.

## 2014-03-18 LAB — LIPID PANEL
CHOLESTEROL: 183 mg/dL (ref 0–200)
HDL: 57 mg/dL (ref 35–70)
LDL CALC: 106 mg/dL
LDl/HDL Ratio: 3.2
Triglycerides: 100 mg/dL (ref 40–160)

## 2014-03-18 LAB — CBC AND DIFFERENTIAL
HCT: 40 % (ref 36–46)
Hemoglobin: 13.2 g/dL (ref 12.0–16.0)
PLATELETS: 168 10*3/uL (ref 150–399)
WBC: 3.4 10*3/mL

## 2014-03-18 LAB — HEPATIC FUNCTION PANEL
ALK PHOS: 39 U/L (ref 25–125)
ALT: 33 U/L (ref 7–35)
AST: 31 U/L (ref 13–35)
Bilirubin, Total: 0.9 mg/dL

## 2014-03-18 LAB — BASIC METABOLIC PANEL
BUN: 15 mg/dL (ref 4–21)
Creatinine: 0.7 mg/dL (ref 0.5–1.1)
GLUCOSE: 88 mg/dL
Potassium: 4 mmol/L (ref 3.4–5.3)
Sodium: 144 mmol/L (ref 137–147)

## 2014-03-18 LAB — TSH: TSH: 1.5 u[IU]/mL (ref 0.41–5.90)

## 2014-03-19 ENCOUNTER — Telehealth: Payer: Self-pay | Admitting: Internal Medicine

## 2014-03-19 ENCOUNTER — Other Ambulatory Visit: Payer: Self-pay | Admitting: Internal Medicine

## 2014-03-19 ENCOUNTER — Encounter: Payer: Self-pay | Admitting: Internal Medicine

## 2014-03-19 MED ORDER — HYDROCHLOROTHIAZIDE 25 MG PO TABS: ORAL_TABLET | ORAL | Status: DC

## 2014-03-19 NOTE — Telephone Encounter (Signed)
Needs to as abstraction.

## 2014-03-19 NOTE — Telephone Encounter (Signed)
Your cholesterol, CBC, thyroid, Vit D,   liver and kidney function are normal.  You do not need any medication changes. Please plan to repeat the a BMET  in 6 months.    Regards,   Dr. [name]

## 2014-03-20 NOTE — Telephone Encounter (Signed)
Called and advised patient of results,  verbalized understanding. 

## 2014-03-30 ENCOUNTER — Other Ambulatory Visit: Payer: Self-pay | Admitting: Obstetrics and Gynecology

## 2014-03-31 ENCOUNTER — Encounter (HOSPITAL_COMMUNITY): Payer: Self-pay | Admitting: Pharmacist

## 2014-04-03 ENCOUNTER — Inpatient Hospital Stay (HOSPITAL_COMMUNITY)
Admission: RE | Admit: 2014-04-03 | Discharge: 2014-04-03 | Disposition: A | Discharge status: Home / Self Care | Payer: BC Managed Care – PPO | Source: Ambulatory Visit

## 2014-04-03 NOTE — Patient Instructions (Addendum)
Your procedure is scheduled on:  Monday, Oct. 26, 2014  Enter through the [employer] of [facility] at: 6:00 a.m.  Pick up the phone at the desk and dial [phone].  Call this number if you have problems the morning of surgery: [phone].  Remember: Do NOT eat food: AFTER MIDNIGHT Sunday OCT. 25, 2015 Do NOT drink clear liquids after: AFTER MIDNIGHT Sunday OCT. 25, 2015 Take these medicines the morning of surgery with a SIP OF WATER: ATENOLOL, HYDROCHLOROTHIAZIDE *STOP ALL VITAMINS/HERBAL SUPPLEMENTS  Do NOT wear jewelry (body piercing), metal hair clips/bobby pins, make-up, or nail polish. Do NOT wear lotions, powders, or perfumes.  You may wear deoderant. Do NOT shave for 48 hours prior to surgery. Do NOT bring valuables to the hospital. Contacts, dentures, or bridgework may not be worn into surgery.  Have a responsible adult drive you home and stay with you for 24 hours after your procedure

## 2014-04-04 ENCOUNTER — Other Ambulatory Visit: Payer: Self-pay | Admitting: Internal Medicine

## 2014-04-13 ENCOUNTER — Ambulatory Visit (HOSPITAL_COMMUNITY)
Admission: RE | Admit: 2014-04-13 | Payer: BC Managed Care – PPO | Source: Ambulatory Visit | Admitting: Obstetrics and Gynecology

## 2014-04-13 ENCOUNTER — Encounter (HOSPITAL_COMMUNITY): Admission: RE | Payer: Self-pay | Source: Ambulatory Visit

## 2014-04-13 SURGERY — DILATATION & CURETTAGE/HYSTEROSCOPY WITH RESECTOCOPE
Anesthesia: Choice

## 2014-04-20 ENCOUNTER — Ambulatory Visit (INDEPENDENT_AMBULATORY_CARE_PROVIDER_SITE_OTHER): Payer: BC Managed Care – PPO | Admitting: Internal Medicine

## 2014-04-20 ENCOUNTER — Encounter: Payer: Self-pay | Admitting: Internal Medicine

## 2014-04-20 VITALS — BP 148/88 | HR 75 | Temp 97.7°F | Resp 16 | Wt 183.8 lb

## 2014-04-20 DIAGNOSIS — D126 Benign neoplasm of colon, unspecified: Secondary | ICD-10-CM

## 2014-04-20 DIAGNOSIS — K130 Diseases of lips: Secondary | ICD-10-CM

## 2014-04-20 DIAGNOSIS — G44309 Post-traumatic headache, unspecified, not intractable: Secondary | ICD-10-CM

## 2014-04-20 DIAGNOSIS — I10 Essential (primary) hypertension: Secondary | ICD-10-CM

## 2014-04-20 DIAGNOSIS — I872 Venous insufficiency (chronic) (peripheral): Secondary | ICD-10-CM

## 2014-04-20 DIAGNOSIS — R002 Palpitations: Secondary | ICD-10-CM

## 2014-04-20 DIAGNOSIS — S0990XS Unspecified injury of head, sequela: Secondary | ICD-10-CM

## 2014-04-20 MED ORDER — LOSARTAN POTASSIUM-HCTZ 50-12.5 MG PO TABS: 1.0000 | ORAL_TABLET | Freq: Every day | ORAL | Status: DC

## 2014-04-20 NOTE — Patient Instructions (Addendum)
We are changing your BP medication to DAILY losartan/hct combination   And as needed tenormin for PACs  Your cholesterol, liver and kidney function are normal.  You do not need any medication changes. Please plan to repeat the labs in 6 months.    [email or web address]  For your compression stockings
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
## 2014-04-20 NOTE — Progress Notes (Signed)
Pre-visit discussion using our clinic review tool. No additional management support is needed unless otherwise documented below in the visit note.  

## 2014-04-21 ENCOUNTER — Telehealth: Payer: Self-pay | Admitting: Internal Medicine

## 2014-04-21 DIAGNOSIS — I872 Venous insufficiency (chronic) (peripheral): Secondary | ICD-10-CM | POA: Insufficient documentation

## 2014-04-21 MED ORDER — ATENOLOL 25 MG PO TABS: ORAL_TABLET | ORAL | Status: DC

## 2014-04-21 NOTE — Assessment & Plan Note (Signed)
Medication changes today: adding losartan/hct ,  Stopping hct ,  Continue tenormin prn.  If headaches increase will need to resume daily use .  Lab Results  Component Value Date   NA 144 03/18/2014   K 4.0 03/18/2014   Lab Results  Component Value Date   CREATININE 0.7 03/18/2014

## 2014-04-21 NOTE — Telephone Encounter (Signed)
Left detailed message per PDR. Informed patient any question to please return call.

## 2014-04-21 NOTE — Assessment & Plan Note (Signed)
By colonoscopy 2014,  3 yr follow up advised.
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
## 2014-04-21 NOTE — Assessment & Plan Note (Signed)
Less frequent, secondary to PACs per cardiology evaluation .  Continue tenormin prn

## 2014-04-21 NOTE — Assessment & Plan Note (Signed)
Based on exam.  Suggested use of compression knee highs on wrk days .  An e  mail address of mail order source given.
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
## 2014-04-24 ENCOUNTER — Telehealth: Payer: Self-pay | Admitting: Internal Medicine

## 2014-04-24 NOTE — Telephone Encounter (Signed)
That's actually a very good blood pressure.  If she is still taking the atenolol ,  I would have her suspend the atenolol first and use it only for recurrent palpitations.  If she has already suspended it,  She can cut it in half it is a symmetric shaped pill .  If it's not I can call in a lower dose or she can try taking it every other day

## 2014-04-24 NOTE — Telephone Encounter (Signed)
I will not adjust without knowing what her bp is on the current dose.

## 2014-04-24 NOTE — Telephone Encounter (Signed)
Patient stated that she tried the new BP medication 04/23/14 patient stated made her feel really tired and lethargic losartan -HCTZ 50-12.5 mg patient would like to try taking half tablet  Please advise .

## 2014-04-24 NOTE — Telephone Encounter (Signed)
She is on  The lowest dose of atenolol,  25 mg and can stop it.

## 2014-04-24 NOTE — Telephone Encounter (Signed)
Patient states she has always been told not to stop the atenolol abruptly that it can cause a heart attack please advise,

## 2014-04-24 NOTE — Telephone Encounter (Signed)
Sorry patient stated that BP was 121/75 HR 80, sorry I should have ask.

## 2014-04-24 NOTE — Telephone Encounter (Signed)
Pt called left message to be called about rx. Please [email or web address]

## 2014-04-24 NOTE — Telephone Encounter (Signed)
Could not reach patient and mail box is full.

## 2014-04-28 NOTE — Telephone Encounter (Signed)
Tried to return call to patient voicemail is full cannot leave message, so patient can advise medication she is taking.

## 2014-04-28 NOTE — Telephone Encounter (Signed)
The patient called back and wanted to report her blood pressure readings   04/27/14 @ 5pm - 144//75 pulse 95 04/27/14 @ 9pm - 159/87 pulse 106  04/28/14 @ 9:30am - 127/85  pulse 67 04/28/14 @ 11am - 130/80 pulse  65   The patient is asking for a call back to discuss this.  Pt callback - [phone]

## 2014-04-28 NOTE — Telephone Encounter (Signed)
Cannot leave message for patient cannot reach by phone at this time.

## 2014-04-29 ENCOUNTER — Telehealth: Payer: Self-pay | Admitting: *Deleted

## 2014-04-29 NOTE — Telephone Encounter (Signed)
Ok then do not stop it,  I still do not have a report of her blood pressures to tell her what to do about the losartan/hct  .  If the bp is not below 120/70  Then it is unlikely to be causing her  to be dizzy

## 2014-04-29 NOTE — Telephone Encounter (Signed)
The patient called back asking to speak with [name] regarding her blood pressure.

## 2014-04-29 NOTE — Telephone Encounter (Signed)
Pt would like a called back about stopping the atenolol, she stated she cant stop that medication because she has tried in the past and her pulse gets really high and she feels discomfort in her chest  Would like a call back.

## 2014-04-30 ENCOUNTER — Telehealth: Payer: Self-pay | Admitting: Cardiovascular Disease

## 2014-04-30 MED ORDER — AMLODIPINE BESYLATE 5 MG PO TABS: 5.0000 mg | ORAL_TABLET | Freq: Every day | ORAL | Status: DC

## 2014-04-30 NOTE — Telephone Encounter (Signed)
Notified patient of change in medication due to her stating symptoms of Dizziness, SOB, and Dull Headache. Patient now stating she does not know if she gave the medication enough time she is not working to day stated she has not had these symptoms, Patient is undecided in what she should do advised patient to start new medication as ordered by MD patient stated she would start.

## 2014-04-30 NOTE — Telephone Encounter (Signed)
Ok tell her to stop the losartan,  i have sent amlodipine 5 mg to her pharmacy to take daily instead,  Increase to 10 mg daily after one week if BP is not < 130/80 after one week of the 5 mg dose

## 2014-04-30 NOTE — Telephone Encounter (Signed)
New message     Pt request to talk to a nurse.  She said Dr [name] want her to call Dr Rockey Situ and talk about changing her bp medication.  Please call before 2pm.

## 2014-04-30 NOTE — Telephone Encounter (Signed)
These are the current BP the patient is presenting  Patient has C/O SOB since starting Losartan/ HCTZ and Dizziness with  Dull headache, patient also stated every time she tries to stop the atenolol she has In heart rate of 100 beats per min. Patient did reduce atenolol to half tablet 12.5 mg. Patient stated she had none of these symptom until she started the Losartan/HCTZ.  04/27/14 @ 5pm - 144//75 pulse 95 04/27/14 @ 9pm - 159/87 pulse 106  04/28/14 @ 9:30am - 127/85 pulse 67 04/28/14 @ 11am - 130/80 pulse 65

## 2014-04-30 NOTE — Telephone Encounter (Signed)
Spoke w/ pt.  She states that Dr. [name] made some med changes that she is concerned about.  Her BP has been increasing recently, but she admits that she has significant stressors and anxiety.  She was prescribed atenolol to be taken on a prn basis, but reports that she has been taking this almost daily. She states that she has tried to reduce or stop atenolol, but her HR will increase to over 100 and she develops chest discomfort.  Reports that she was previously switched to losartan-HCTZ, but this makes her dizzy and gives her a headache.  Pt reports h/o head concussion, which causes occasional headaches, but reports med induced HA is different.  She tried to stay off of atenolol for 3 days, but became symptomatic again and developed chest pressure, but she is unsure if this was r/t gas, as she has been belching quite a bit.  Advised pt that we have not seen in her in over a year, so I recommend that she sched an appt to see Dr. Rockey Situ to discuss.  Pt sched to see Dr. Rockey Situ 05/05/14 @ 10:30.

## 2014-05-04 ENCOUNTER — Telehealth: Payer: Self-pay | Admitting: Cardiovascular Disease

## 2014-05-04 NOTE — Telephone Encounter (Signed)
Pt bp is 167/90, this was usual for patient. She is calling to see what we can do. Dr Rich Reining took pt off Lorsartin, she was on it for about 5-6 days. But pt couldn't handle it for symptoms so she tried AmLODIPine and she could handle this either and so then she started back on her tenomine Saturday, but pt was at work and she felt okay, and now since she checked her BP just shot up like that and now pt is on her way home. Please call pt, she is very concerned

## 2014-05-05 ENCOUNTER — Encounter: Payer: Self-pay | Admitting: Cardiovascular Disease

## 2014-05-05 ENCOUNTER — Ambulatory Visit (INDEPENDENT_AMBULATORY_CARE_PROVIDER_SITE_OTHER): Payer: BC Managed Care – PPO | Admitting: Cardiovascular Disease

## 2014-05-05 ENCOUNTER — Encounter (INDEPENDENT_AMBULATORY_CARE_PROVIDER_SITE_OTHER): Payer: Self-pay

## 2014-05-05 VITALS — BP 140/80 | HR 58 | Ht 64.0 in | Wt 183.0 lb

## 2014-05-05 DIAGNOSIS — R002 Palpitations: Secondary | ICD-10-CM

## 2014-05-05 DIAGNOSIS — F5101 Primary insomnia: Secondary | ICD-10-CM | POA: Insufficient documentation

## 2014-05-05 DIAGNOSIS — I10 Essential (primary) hypertension: Secondary | ICD-10-CM

## 2014-05-05 DIAGNOSIS — G4700 Insomnia, unspecified: Secondary | ICD-10-CM

## 2014-05-05 DIAGNOSIS — R079 Chest pain, unspecified: Secondary | ICD-10-CM

## 2014-05-05 DIAGNOSIS — F419 Anxiety disorder, unspecified: Secondary | ICD-10-CM

## 2014-05-05 MED ORDER — ATENOLOL 25 MG PO TABS: ORAL_TABLET | ORAL | Status: DC

## 2014-05-05 MED ORDER — HYDROCHLOROTHIAZIDE 25 MG PO TABS: 25.0000 mg | ORAL_TABLET | Freq: Every day | ORAL | Status: DC

## 2014-05-05 NOTE — Telephone Encounter (Signed)
Spoke w/ pt.  She reports that she is feeling better this am. She reports an increased amount of anxiety recently, she had to speak in public last week and believes that contributed to her elevated BP. Encouraged pt to keep her appt w/ Dr. Lise Auer this am.

## 2014-05-05 NOTE — Assessment & Plan Note (Signed)
She's had a difficult time with her medications recently secondary to side effects. Recommended she go back on her HCTZ 25 mg daily and atenolol 25 mg daily. Monitor her blood pressure. If blood pressure continues to run high, one option would be to take additional half dose of each medication. Alternatively could try to change the atenolol to bystolic. Some of her blood pressure medications are from work when she is very stressed out. Recommended she try to also monitor her blood pressure in the evenings and weekends

## 2014-05-05 NOTE — Progress Notes (Signed)
Patient ID: [name], female    DOB: 1961/03/21, 53 y.o.   MRN: [id number]  HPI Comments: [name] is a very pleasant 53 year old woman with history of palpitations, hypertension who presents for evaluation of her  Blood pressure. Previous history of occasional chest tightness  In follow-up today, she reports having significant stress at work. 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 She reports having labile pressures are typically in the 160 range when she is stressed. Recently started on losartan and she was on this for 1 week but reported having significant side effects of general malaise This was held and she was started on amlodipine which she only took for 1 day as this caused a headache Now she is back on HCTZ one half pill and atenolol 25 mg daily. She's been on this combination for a long time Blood pressure ranging from 120 up to 160 She has problems with sleeping secondary to anxiety, stress Denies having any chest pain or shortness of breath. She does have chest pain if she is not on her atenolol  Recent lab work showing total cholesterol 183, LDL 106. This was reviewed with her Occasionally has palpitations but much better controlled on the atenolol  EKG today shows normal sinus rhythm with rate 58 beats per minute, no significant ST or T wave changes  Other past medical history  Myoview in 2005 for ectopy that was reportedly normal. She attributes her symptoms to menopause. Menopause symptoms have been getting worse over the past several years. Now with sweating, occasional palpitations, malaise.   history of concussion in April 2014 and was evaluated by Dr. Manuella Ghazi at [facility].  The injury occurred when she fell out of a chair and a metal bar on chair hit her in the back of head    Seeing a headache specialist in at [place] [facility]  Peever of CAD in father . Cholesterol 186, LDL 122 in August 2014    Outpatient Encounter Prescriptions as  of 05/05/2014  Medication Sig  . Acyclovir (ZOVIRAX PO) Take by mouth as needed (herpes outbreak).  Marland Kitchen atenolol (TENORMIN) 25 MG tablet One tablet daily as needed for PAC's  . Biotin (BIOTIN MAXIMUM STRENGTH) 10 MG TABS Take by mouth.  . Cholecalciferol (VITAMIN D-3) 1000 UNITS CAPS Take 1 capsule by mouth daily.  . hydrochlorothiazide (HYDRODIURIL) 12.5 MG tablet Take 12.5 mg by mouth daily.  Marland Kitchen MAGNESIUM PO Take 500 mg by mouth daily.   . Multiple Vitamins-Minerals (HAIR VITAMINS PO) Take by mouth.  . Multiple Vitamins-Minerals (MULTIVITAMIN WITH MINERALS) tablet Take 1 tablet by mouth daily.  . Omega-3 Fatty Acids (FISH OIL) 1200 MG CAPS Take 1,200 mg by mouth 2 (two) times daily.  . pantoprazole (PROTONIX) 40 MG tablet TAKE 1 TABLET 30 MINUTES BEFORE BREAKFAST (Patient taking differently: TAKE 1 TABLET 30 MINUTES BEFORE BREAKFAST AS NEEDED.)  . Specialty Vitamins Products (WOMENS MENOPAUSE VITA PAK PO) Take by mouth.  . [DISCONTINUED] amLODipine (NORVASC) 5 MG tablet Take 1 tablet (5 mg total) by mouth daily.  . [DISCONTINUED] losartan-hydrochlorothiazide (HYZAAR) 50-12.5 MG per tablet Take 1 tablet by mouth daily.   Social history  reports that she has never smoked. She has never used smokeless tobacco. She reports that she drinks alcohol. She reports that she does not use illicit drugs.  Review of Systems  Constitutional: Negative.   Respiratory: Negative.   Cardiovascular: Negative.   Endocrine: Negative.   Musculoskeletal: Negative.   Skin:  Negative.   Neurological: Positive for headaches.  Hematological: Negative.   Psychiatric/Behavioral: Positive for sleep disturbance. The patient is nervous/anxious.   All other systems reviewed and are negative.   BP 140/80 mmHg  Pulse 58  Ht 5\' 4"  (1.626 m)  Wt 183 lb (83.008 kg)  BMI 31.40 kg/m2  LMP 11/18/2012  Physical Exam  Constitutional: She is oriented to person, place, and time. She appears well-developed and well-nourished.   HENT:  Head: Normocephalic.  Nose: Nose normal.  Mouth/Throat: Oropharynx is clear and moist.  Eyes: Conjunctivae are normal. Pupils are equal, round, and reactive to light.  Neck: Normal range of motion. Neck supple. No JVD present.  Cardiovascular: Normal rate, regular rhythm, S1 normal, S2 normal, normal heart sounds and intact distal pulses.  Exam reveals no gallop and no friction rub.   No murmur heard. Pulmonary/Chest: Effort normal and breath sounds normal. No respiratory distress. She has no wheezes. She has no rales. She exhibits no tenderness.  Abdominal: Soft. Bowel sounds are normal. She exhibits no distension. There is no tenderness.  Musculoskeletal: Normal range of motion. She exhibits no edema or tenderness.  Lymphadenopathy:    She has no cervical adenopathy.  Neurological: She is alert and oriented to person, place, and time. Coordination normal.  Skin: Skin is warm and dry. No rash noted. No erythema.  Psychiatric: She has a normal mood and affect. Her behavior is normal. Judgment and thought content normal.    Assessment and Plan  Nursing note and vitals reviewed.

## 2014-05-05 NOTE — Assessment & Plan Note (Signed)
Rare APCs, PVCs. Symptoms are better with beta blocker. Without the atenolol, she also has chest tightness

## 2014-05-05 NOTE — Patient Instructions (Signed)
You are doing well.  Please restart HCTZ 25 mg daily, restart atenolol 25 mg daily If blood pressure runs high,  Call the office  We would consider starting bystolic instead of atenolol if needed (if BP runs high) Goal systolic bp is 709U Diatolic less than 90  Please call us if you have new issues that need to be addressed before your next appt.  Your physician wants you to follow-up in: 12 months.  You will receive a reminder letter in the mail two months in advance. If you don't receive a letter, please call our office to schedule the follow-up appointment.

## 2014-05-05 NOTE — Assessment & Plan Note (Signed)
She reports having problems with insomnia. Possibly from work-related stress. Suggested she try melatonin, Benadryl. If no relief, would talk with Dr. [name].  Also suggested a regular exercise program

## 2014-05-05 NOTE — Assessment & Plan Note (Signed)
Long discussion about work related stress today. She has insomnia, feeling overworked. She is requesting a work note for today and tomorrow so she can "recover and get some sleep" This was provided to her today. Recommended she start a regular exercise program, talk with Dr. [name] if symptoms persist.

## 2014-05-16 ENCOUNTER — Other Ambulatory Visit: Payer: Self-pay | Admitting: Internal Medicine

## 2014-05-22 ENCOUNTER — Telehealth: Payer: Self-pay

## 2014-05-22 NOTE — Telephone Encounter (Signed)
I cannot call in controlled substances without an office evaluation.  Has she tried any medications in the past?

## 2014-05-22 NOTE — Telephone Encounter (Signed)
Patient has not tried any sleep medications in the past advised patient would need MD visit. Transferred to scheduling

## 2014-05-22 NOTE — Telephone Encounter (Signed)
The patient called hoping to get an rx called in for a sleep aid.  She states she is having trouble sleeping due to menopause

## 2014-05-22 NOTE — Telephone Encounter (Signed)
Please advise 

## 2014-05-26 ENCOUNTER — Other Ambulatory Visit: Payer: Self-pay | Admitting: Obstetrics and Gynecology

## 2014-05-28 ENCOUNTER — Telehealth: Payer: Self-pay | Admitting: Cardiovascular Disease

## 2014-05-28 MED ORDER — NEBIVOLOL HCL 10 MG PO TABS: 10.0000 mg | ORAL_TABLET | Freq: Every day | ORAL | Status: DC

## 2014-05-28 NOTE — Telephone Encounter (Signed)
Since increase in meds, pt has not been feeling well. She is getting dizzy spells, making her urine a lot more she feels. The range in bp is doing great, but she would like to try the beta blocker that he suggested as well. [facility] to [facility] [address] in [place]. Please call patient.

## 2014-05-28 NOTE — Telephone Encounter (Signed)
Since increase in meds, pt has not been feeling well. She is getting dizzy spells, making her urine a lot more she feels. The range in bp is doing great, but she would like to try the beta blocker that he suggested as well. [facility] [address] in [place]. Please call patient.

## 2014-05-28 NOTE — Telephone Encounter (Signed)
Pt wants to switch from atenolol and try bystolic.  Per her last ov: "We would consider starting bystolic instead of atenolol if needed (if BP runs high)"

## 2014-05-28 NOTE — Telephone Encounter (Signed)
One option would be to decrease the HCTZ back to one half pill daily Hold the atenolol Comment for samples of bystolic, start 10 mg daily Also coupon card This could be titrated upwards or downwards as needed

## 2014-05-28 NOTE — Telephone Encounter (Signed)
Spoke w/ pt.  Advised her of Dr. Gollan's recommendation. She verbalizes understanding and will call back w/ any questions or concerns.  

## 2014-06-01 ENCOUNTER — Ambulatory Visit (INDEPENDENT_AMBULATORY_CARE_PROVIDER_SITE_OTHER): Payer: BC Managed Care – PPO

## 2014-06-01 ENCOUNTER — Telehealth: Payer: Self-pay | Admitting: Cardiovascular Disease

## 2014-06-01 VITALS — BP 148/84 | Ht 64.0 in | Wt 190.5 lb

## 2014-06-01 DIAGNOSIS — I10 Essential (primary) hypertension: Secondary | ICD-10-CM

## 2014-06-01 MED ORDER — ATENOLOL 25 MG PO TABS: 25.0000 mg | ORAL_TABLET | Freq: Two times a day (BID) | ORAL | Status: DC

## 2014-06-01 MED ORDER — LOSARTAN POTASSIUM 50 MG PO TABS: 50.0000 mg | ORAL_TABLET | Freq: Every day | ORAL | Status: DC

## 2014-06-01 NOTE — Telephone Encounter (Signed)
Pt is concerned about meds. She is wanting nurse to call her. Bystolic 10 mg is the med she is concerned with. She is just not feeling normal. Wants to make sure what she is feeling is okay. Please call patient.

## 2014-06-01 NOTE — Telephone Encounter (Signed)
Pt presented to office.  Please see nurse visit.

## 2014-06-01 NOTE — Telephone Encounter (Signed)
Pt dropped off BP readings: 11/21 123/74, 79 8:49am 105/66, 72 12:00pm 150/82  9:00pm 133/65, 73 9:30pm 11/22 130/78 69 141/75, 75 153/73, 70 11/24 139/73 136/70 12/3 126/70, 87 139/73, 90 136/70, 73 12/7 147/70, 66 146/68, 67 12/9 129/76, 99 12/10 122/73 139/69 12/11 126/65, 69 137/73 Sunday 138/77, 82 160/66 ,71 115/61, 80 Saturday 141/77, 72 133/71, 66 150/75, 69 137/69, 82 133/73, 77 121/61, 88 128/65, 81 149/75, 69 127/70, 71 133/75, 58 151/75, 80 Monday 167/90, 62

## 2014-06-01 NOTE — Progress Notes (Signed)
1.) Reason for visit: Hypertension  2.) Name of MD requesting visit: Dr. Rockey Situ  3.) H&P: Pt dropped off recent BP readings, see today's phone note for these. Pt started on Bystolic 10 mg on 61/51.  Reports that she is not tolerating this well.    4.) ROS related to problem: Pt reports that she is "sensitive to medications" and that she does not feel good since starting Bystolic.  Pt reports "constant headache" since starting this med.  Pt reports that she has tried to stop atenolol several times previously, but HR increases and she develops chest pain.  Reports HCTZ made her feel dizzy.  Pt is very concerned about potential side effects of any medications.  Pt reports that her chart lists losartan-potassium- hctz as a allergy, but she states she believes it is the HCTZ that was causing her problems.   5.) Assessment and plan per MD:  Spoke w/ Dr. Rockey Situ.  Pt advised to discontinue HCTZ, discontinue Bystolic, remain on atenolol 25 mg, and start Losartan 50mg .  Pt is agreeable to this and has scheduled a f/u appt to discuss w/ Dr. Rockey Situ.

## 2014-06-01 NOTE — Patient Instructions (Addendum)
Please discontinue HCTZ Please discontinue Bystolic  Please continue Atenolol 25 mg  Please start losartan 50 mg once daily (cut in 1/2 to how well you tolerate)  Please follow up with Dr. Rockey Situ in the next few weeks

## 2014-06-01 NOTE — Telephone Encounter (Signed)
Pt called back, states she has not taken any medications today.

## 2014-06-03 ENCOUNTER — Telehealth: Payer: Self-pay | Admitting: *Deleted

## 2014-06-03 NOTE — Telephone Encounter (Signed)
Patient needs samples of Bystolic 5mg . Please call patient when ready for pick up.

## 2014-06-03 NOTE — Telephone Encounter (Signed)
Bystolic 5 mg @ front desk for pick up.

## 2014-06-11 ENCOUNTER — Telehealth: Payer: Self-pay | Admitting: Cardiovascular Disease

## 2014-06-11 NOTE — Telephone Encounter (Signed)
Christell Faith, PA reviewed pt's chart and agrees that pt is to follow directions from last visit and to do so for at least 2 weeks: "Pt advised to discontinue HCTZ, discontinue Bystolic, remain on atenolol 25 mg, and start Losartan 50mg ." [facility]  She reports that pt still had this on her med list w/ refills.  She will update pt's record and have her call us w/ any questions or concerns.

## 2014-06-11 NOTE — Telephone Encounter (Signed)
Please review

## 2014-06-11 NOTE — Telephone Encounter (Signed)
Pt calling needing a authorization so she can get a refill on HTC and bystolic. She would need them today for everything closes around 12 today.   [facility] [address] [place]   140/70 being the highest of her bp   Worried that if she doesn't take it then her BP will raise again.

## 2014-06-22 ENCOUNTER — Telehealth: Payer: Self-pay

## 2014-06-22 NOTE — Telephone Encounter (Signed)
Per nurse visit on 06/01/14, pt was advised to d/c Bystolic, as it was giving her a headache. Pt attempted to have this refilled, along w/ her HCTZ (this was also d/c'd) on 06/11/14. Pt states that these meds were never stopped and she would like refills on them. Pt sched to see Dr. Rockey Situ on 07/03/14 to f/u after switching meds, but she has not done so.  Please advise. Thank you.

## 2014-06-22 NOTE — Telephone Encounter (Signed)
Pt called, states Dr. Rockey Situ gave her 5 mg Bystolic, and needs a refill. Please call. States Losartan makes her sick. Pt would like a call before 2:00 pm

## 2014-06-22 NOTE — Telephone Encounter (Signed)
Okay to renew bystolic 5 mg daily We should have a co-pay card to reduce the cost  Also okay to renew the HCTZ 25 mill grams daily Okay to cut in half if needed

## 2014-06-23 MED ORDER — NEBIVOLOL HCL 5 MG PO TABS: 5.0000 mg | ORAL_TABLET | Freq: Every day | ORAL | Status: DC

## 2014-06-23 MED ORDER — HYDROCHLOROTHIAZIDE 25 MG PO TABS: 25.0000 mg | ORAL_TABLET | Freq: Every day | ORAL | Status: DC

## 2014-06-23 NOTE — Telephone Encounter (Signed)
Spoke w/ pt.  Advised her of Dr. Donivan Scull recommendation.  She is appreciative and will pick up Bystolic samples at the front desk at her convenience.

## 2014-06-23 NOTE — Telephone Encounter (Signed)
Attempted to contact pt.   No answer, vm box has not been set up yet.

## 2014-07-03 ENCOUNTER — Ambulatory Visit: Payer: BC Managed Care – PPO | Admitting: Cardiovascular Disease

## 2014-07-03 ENCOUNTER — Ambulatory Visit: Payer: BC Managed Care – PPO | Admitting: Internal Medicine

## 2014-07-06 ENCOUNTER — Other Ambulatory Visit (HOSPITAL_COMMUNITY): Payer: Self-pay

## 2014-07-08 ENCOUNTER — Ambulatory Visit (HOSPITAL_COMMUNITY)
Admission: RE | Admit: 2014-07-08 | Payer: BLUE CROSS/BLUE SHIELD | Source: Ambulatory Visit | Admitting: Obstetrics and Gynecology

## 2014-07-08 SURGERY — DILATATION & CURETTAGE/HYSTEROSCOPY WITH RESECTOCOPE
Anesthesia: Choice

## 2014-07-20 ENCOUNTER — Encounter: Payer: Self-pay | Admitting: Cardiovascular Disease

## 2014-07-20 ENCOUNTER — Ambulatory Visit (INDEPENDENT_AMBULATORY_CARE_PROVIDER_SITE_OTHER): Payer: BLUE CROSS/BLUE SHIELD | Admitting: Cardiovascular Disease

## 2014-07-20 VITALS — BP 128/72 | HR 59 | Ht 64.0 in | Wt 192.0 lb

## 2014-07-20 DIAGNOSIS — I10 Essential (primary) hypertension: Secondary | ICD-10-CM

## 2014-07-20 DIAGNOSIS — I872 Venous insufficiency (chronic) (peripheral): Secondary | ICD-10-CM

## 2014-07-20 DIAGNOSIS — R002 Palpitations: Secondary | ICD-10-CM

## 2014-07-20 NOTE — Patient Instructions (Signed)
You are doing well. No medication changes were made.  Please call if you need propranolol as needed for breakthrough palpitations Try the compression hose if needed   Please call us if you have new issues that need to be addressed before your next appt.  Your physician wants you to follow-up in: 12 months.  You will receive a reminder letter in the mail two months in advance. If you don't receive a letter, please call our office to schedule the follow-up appointment.

## 2014-07-20 NOTE — Progress Notes (Signed)
Patient ID: Nicole Gould, female    DOB: [birth year], 54 y.o.   MRN: [id number]  HPI Comments: Nicole Gould is a very pleasant 54 year old woman with history of palpitations, hypertension who presents for follow-up of her Blood pressure. Previous history of occasional chest tightness  In follow-up today, she reports that she is doing very well. She takes HCTZ 12.5 mill grams daily, also tolerating bystolic 5 mg daily. She reports that her blood pressure is well controlled, typically 315 up to 176 systolic, heart rate also well-controlled with no significant palpitations at work, even on stressful days. She did try 10 mg of the beta blocker but had side effects. Also reports when she took extra HCTZ she had groin cramping extending into her back She is comfortable with her current medications  EKG on today's visit shows normal sinus rhythm with rate 59 bpm, no significant ST or T-wave changes  Other past medical history Previously started on losartan and she was on this for 1 week but reported having significant side effects of general malaise This was held and she was started on amlodipine which she only took for 1 day as this caused a headache  back on HCTZ one half pill and atenolol 25 mg daily. She's had been on this combination for a long time Blood pressure rangig from 120 up to 160n Previous problems with sleeping secondary to anxiety, stress   lab work showing total cholesterol 183, LDL 106. This was reviewed with her   Myoview in 2005 for ectopy that was reportedly normal. She attributes her symptoms to menopause. Menopause symptoms have been getting worse over the past several years. Now with sweating, occasional palpitations, malaise.   history of concussion in April 2014 and was evaluated by Dr. Manuella Ghazi at [facility].  The injury occurred when she fell out of a chair and a metal bar on chair hit her in the back of head    Seeing a headache specialist in at [facility]  [place]  [facility] in father .    Allergies  Allergen Reactions  . Anesthetics, Amide Nausea And Vomiting  . Losartan Potassium-Hctz     Dizziness,  Shortness of breath     Outpatient Encounter Prescriptions as of 07/20/2014  Medication Sig  . Biotin (BIOTIN MAXIMUM STRENGTH) 10 MG TABS Take 1 tablet by mouth daily.   . Cholecalciferol (VITAMIN D-3) 1000 UNITS CAPS Take 1 capsule by mouth daily.  . hydrochlorothiazide (HYDRODIURIL) 25 MG tablet Take 1 tablet (25 mg total) by mouth daily. (Patient taking differently: Take 12.5 mg by mouth daily. )  . MAGNESIUM PO Take 500 mg by mouth daily.   . Multiple Vitamins-Minerals (MULTIVITAMIN WITH MINERALS) tablet Take 1 tablet by mouth daily.  . nebivolol (BYSTOLIC) 5 MG tablet Take 1 tablet (5 mg total) by mouth daily.  . Omega-3 Fatty Acids (FISH OIL) 1200 MG CAPS Take 1,200 mg by mouth daily.   . pantoprazole (PROTONIX) 40 MG tablet TAKE 1 TABLET 30 MINUTES BEFORE BREAKFAST (Patient taking differently: TAKE 1 TABLET 30 MINUTES BEFORE BREAKFAST AS NEEDED.)  . Specialty Vitamins Products (WOMENS MENOPAUSE VITA PAK PO) Take 1 tablet by mouth daily as needed (menopause symptoms).   . [DISCONTINUED] atenolol (TENORMIN) 25 MG tablet Take 1 tablet (25 mg total) by mouth 2 (two) times daily. (Patient not taking: Reported on 07/20/2014)    Past Medical History  Diagnosis Date  . Chicken pox   . Frequent headaches   . GERD (gastroesophageal reflux disease)   .  Hypertension   . Seizure disorder in pregnancy   . Colon polyps   . Migraines   . Concussion     Past Surgical History  Procedure Laterality Date  . Tonsillectomy    . Tubal ligation      Social History  reports that she has never smoked. She has never used smokeless tobacco. She reports that she drinks alcohol. She reports that she does not use illicit drugs.  Family History family history includes Cancer in her maternal grandfather; Diabetes in her father and mother; Heart disease in her father;  Hypertension in her father and mother; Kidney disease in her father.  Review of Systems  Constitutional: Negative.   Respiratory: Negative.   Cardiovascular: Negative.   Endocrine: Negative.   Musculoskeletal: Negative.   Skin: Negative.   Neurological: Negative.   Hematological: Negative.   Psychiatric/Behavioral: Positive for sleep disturbance. The patient is nervous/anxious.   All other systems reviewed and are negative.   BP 128/72 mmHg  Pulse 59  Ht 5\' 4"  (1.626 m)  Wt 192 lb (87.091 kg)  BMI 32.94 kg/m2  Physical Exam  Constitutional: She is oriented to person, place, and time. She appears well-developed and well-nourished.  HENT:  Head: Normocephalic.  Nose: Nose normal.  Mouth/Throat: Oropharynx is clear and moist.  Eyes: Conjunctivae are normal. Pupils are equal, round, and reactive to light.  Neck: Normal range of motion. Neck supple. No JVD present.  Cardiovascular: Normal rate, regular rhythm, S1 normal, S2 normal, normal heart sounds and intact distal pulses.  Exam reveals no gallop and no friction rub.   No murmur heard. Pulmonary/Chest: Effort normal and breath sounds normal. No respiratory distress. She has no wheezes. She has no rales. She exhibits no tenderness.  Abdominal: Soft. Bowel sounds are normal. She exhibits no distension. There is no tenderness.  Musculoskeletal: Normal range of motion. She exhibits no edema or tenderness.  Lymphadenopathy:    She has no cervical adenopathy.  Neurological: She is alert and oriented to person, place, and time. Coordination normal.  Skin: Skin is warm and dry. No rash noted. No erythema.  Psychiatric: She has a normal mood and affect. Her behavior is normal. Judgment and thought content normal.    Assessment and Plan  Nursing note and vitals reviewed.

## 2014-07-20 NOTE — Assessment & Plan Note (Signed)
Recommended that she wear compression hose as needed

## 2014-07-20 NOTE — Assessment & Plan Note (Signed)
She reports that her palpitations have significantly improved on low-dose beta blocker. No medication changes made

## 2014-07-20 NOTE — Assessment & Plan Note (Signed)
Blood pressure seems to be well controlled on her current dose of HCTZ 12.5 mill grams daily and bystolic 5 mg daily. Samples provided today.

## 2014-08-03 ENCOUNTER — Ambulatory Visit (INDEPENDENT_AMBULATORY_CARE_PROVIDER_SITE_OTHER): Payer: BLUE CROSS/BLUE SHIELD | Admitting: Internal Medicine

## 2014-08-03 ENCOUNTER — Encounter: Payer: Self-pay | Admitting: Internal Medicine

## 2014-08-03 ENCOUNTER — Ambulatory Visit: Payer: BLUE CROSS/BLUE SHIELD | Admitting: Nurse Practitioner

## 2014-08-03 VITALS — BP 138/82 | HR 69 | Temp 97.6°F | Resp 14 | Ht 64.0 in | Wt 196.0 lb

## 2014-08-03 DIAGNOSIS — R300 Dysuria: Secondary | ICD-10-CM | POA: Diagnosis not present

## 2014-08-03 DIAGNOSIS — I10 Essential (primary) hypertension: Secondary | ICD-10-CM

## 2014-08-03 DIAGNOSIS — R1030 Lower abdominal pain, unspecified: Secondary | ICD-10-CM

## 2014-08-03 DIAGNOSIS — R102 Pelvic and perineal pain: Secondary | ICD-10-CM | POA: Insufficient documentation

## 2014-08-03 NOTE — Progress Notes (Signed)
Pre-visit discussion using our clinic review tool. No additional management support is needed unless otherwise documented below in the visit note.  

## 2014-08-03 NOTE — Progress Notes (Signed)
Patient ID: Nicole Gould, female   DOB: Mar 05, 1961, 54 y.o.   MRN: [id number]  Patient Active Problem List   Diagnosis Date Noted  . Suprapubic pain 08/03/2014  . Insomnia 05/05/2014  . Anxiety 05/05/2014  . Chronic venous insufficiency 04/21/2014  . Adenomatous polyp of colon 05/12/2013  . Visit for preventive health examination 03/14/2013  . Pain in joint, pelvic region and thigh 01/28/2013  . Essential hypertension, benign 01/28/2013  . Vaginitis and vulvovaginitis 01/28/2013  . Palpitations 01/28/2013  . 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 Acute Visit    bilateral groin pain    HPI:   Nicole Gould is a 54 y.o. female who presents for  Bilateral inguinal pain .  She presents with recurrent bilateral pubic cramping and pressure,  occsionaly radiates to lower back.Thinks it first happened after Dr. Rockey Situ increased her hctz to 25 mg daily , so she stopped the higher dose.  The pain recurred when she took an extra dose during the last snowstorm .   Third episode occurred yesterday without taking an an additional dose .  Bowels move every few days ,  More if she eats kale daily .  No history of hematochezia or diverticulisis by last colonoscopy done in 2014,  Only polyps next due 2017.    No vaginal symptoms or dysuria. Decribes a dull aching sensation right before she has  To urinate  ,  Couldn't urinate today   pelvic exam normal today       Past Medical History  Diagnosis Date  . Chicken pox   . Frequent headaches   . GERD (gastroesophageal reflux disease)   . Hypertension   . Seizure disorder in pregnancy   . Colon polyps   . Migraines   . Concussion     Past Surgical History  Procedure Laterality Date  . Tonsillectomy    . Tubal ligation         The following portions of the patient's history were reviewed and updated as appropriate: Allergies, current medications, and  problem list.    Review of Systems:   Patient denies headache, fevers, malaise, unintentional weight loss, skin rash, eye pain, sinus congestion and sinus pain, sore throat, dysphagia,  hemoptysis , cough, dyspnea, wheezing, chest pain, palpitations, orthopnea, edema, abdominal pain, nausea, melena, diarrhea, constipation, flank pain, dysuria, hematuria, urinary  Frequency, nocturia, numbness, tingling, seizures,  Focal weakness, Loss of consciousness,  Tremor, insomnia, depression, anxiety, and suicidal ideation.     History   Social History  . Marital Status: Married    Spouse Name: N/A  . Number of Children: N/A  . Years of Education: N/A   Occupational History  . Not on file.   Social History Main Topics  . Smoking status: Never Smoker   . Smokeless tobacco: Never Used  . Alcohol Use: Yes  . Drug Use: No  . Sexual Activity:    Partners: Male   Other Topics Concern  . Not on file   Social History Narrative    Objective:  Filed Vitals:   08/03/14 1603  BP: 138/82  Pulse: 69  Temp: 97.6 F (36.4 C)  Resp: 14     General appearance: alert, cooperative and appears stated age Ears: normal TM's and external ear canals both ears Throat: lips, mucosa, and tongue normal; teeth and gums normal Neck: no adenopathy, no carotid bruit, supple, symmetrical, trachea midline  and thyroid not enlarged, symmetric, no tenderness/mass/nodules Back: symmetric, no curvature. ROM normal. No CVA tenderness. Lungs: clear to auscultation bilaterally Heart: regular rate and rhythm, S1, S2 normal, no murmur, click, rub or gallop Abdomen: soft, non-tender; bowel sounds normal; no masses,  no organomegaly Pulses: 2+ and symmetric Skin: Skin color, texture, turgor normal. No rashes or lesions Lymph nodes: Cervical, supraclavicular, and axillary nodes normal.  Assessment and Plan:  Suprapubic pain Etiology likely constipatoin /IBS.  Pelvic exam was normal.  She was unable to provide a  specimen for urinalysis and culture.  Order written .    Essential hypertension, benign She has a long list of medication intolerances .  Advised her to stop hctz and use increased dose of bystolic if needed.     Updated Medication List Outpatient Encounter Prescriptions as of 08/03/2014  Medication Sig  . Biotin (BIOTIN MAXIMUM STRENGTH) 10 MG TABS Take 1 tablet by mouth daily.   . Cholecalciferol (VITAMIN D-3) 1000 UNITS CAPS Take 1 capsule by mouth daily.  . hydrochlorothiazide (HYDRODIURIL) 25 MG tablet Take 0.5 tablets (12.5 mg total) by mouth daily.  Marland Kitchen MAGNESIUM PO Take 500 mg by mouth daily.   . Multiple Vitamins-Minerals (MULTIVITAMIN WITH MINERALS) tablet Take 1 tablet by mouth daily.  . nebivolol (BYSTOLIC) 5 MG tablet Take 1 tablet (5 mg total) by mouth daily.  . Omega-3 Fatty Acids (FISH OIL) 1200 MG CAPS Take 1,200 mg by mouth daily.   . pantoprazole (PROTONIX) 40 MG tablet TAKE 1 TABLET 30 MINUTES BEFORE BREAKFAST (Patient taking differently: TAKE 1 TABLET 30 MINUTES BEFORE BREAKFAST AS NEEDED.)  . Specialty Vitamins Products (WOMENS MENOPAUSE VITA PAK PO) Take 1 tablet by mouth daily as needed (menopause symptoms).      Orders Placed This Encounter  Procedures  . Urine Culture  . Urinalysis, Routine w reflex microscopic  . POCT Urinalysis Dipstick    No Follow-up on file.

## 2014-08-03 NOTE — Patient Instructions (Signed)
Please stop the hctz for now.  You can increase the bystolic to 10 mg if BP remains > 140, or we can try a milder medication called spironolactone   Please send off a urine for the tests I ordered  Your pain may be due to an infection,  Or to interstitial cystitis, which may require an evaluation by a urologist ,  Depending on the results of the urine test

## 2014-08-04 ENCOUNTER — Encounter: Payer: Self-pay | Admitting: Internal Medicine

## 2014-08-04 NOTE — Assessment & Plan Note (Signed)
She has a long list of medication intolerances .  Advised her to stop hctz and use increased dose of bystolic if needed.

## 2014-08-04 NOTE — Assessment & Plan Note (Signed)
Etiology likely constipatoin /IBS.  Pelvic exam was normal.  She was unable to provide a specimen for urinalysis and culture.  Order written .

## 2014-08-07 ENCOUNTER — Telehealth: Payer: Self-pay | Admitting: *Deleted

## 2014-08-07 NOTE — Telephone Encounter (Signed)
Pt was upset that we stated to pt that she needs to call her pcp. Per Dr.Arida She was upset that we couldn't not get a nurse for her.

## 2014-08-07 NOTE — Telephone Encounter (Signed)
Pt is calling again, told her that we would call as soon as we could.  She was calmer and said that was fine.  Also told her that looking at her charts that dr.tullo stated that she was able to take 10 mg States we should be able to bend the rules, if someone is in need we need to just help that patient. Also states she will take one more 5 mg of bystolic.  Says this is perfect time to try taking two doses of this.  She said if we call this it is okay but if we do call that is would be great.

## 2014-08-07 NOTE — Telephone Encounter (Signed)
Pt is stating that the BP med's we put her on and then her PCP took her off. She is not taking the medication.  161/71 just took it PCP wants to change it but in the mean time her BP is going high.  PCP said that her Hydrochlorothiazide that dr Rockey Situ took is causing pt inflammation on bladder  Dx: cystitis Just stopped taking it Tuesday of this week.  Have not told PCP about this yet.

## 2014-08-07 NOTE — Telephone Encounter (Signed)
She can stop the HCTZ if she would like as directed by Dr. [name]  Would agree with increasing bystolic up to 5 mg twice a day Previously she mentioned possibly having side effects on the higher dose, If this happens, would go back to the 5 mg daily  If blood pressure runs high, options include: Retry amlodipine one half pill daily (previously a full pill caused headache) Also could retry losartan one half pill daily  Otherwise we are starting a new medication Options there are include lisinopril 10 mg daily or enalapril 5 mg daily Other options include Cardura 4 mg (could cut in half to start)

## 2014-08-07 NOTE — Telephone Encounter (Signed)
Spoke w/ pt.  Advised her of Dr. Donivan Scull recommendation.  She verbalizes understanding and is agreeable.  Asked her to call back w/ any questions or concerns.

## 2014-08-12 ENCOUNTER — Telehealth: Payer: Self-pay | Admitting: Internal Medicine

## 2014-08-12 NOTE — Telephone Encounter (Signed)
Patient called and left voicemail asking for a return call tried to call patient no answer left voicemail to return my call.

## 2014-09-08 ENCOUNTER — Telehealth: Payer: Self-pay

## 2014-09-08 ENCOUNTER — Other Ambulatory Visit: Payer: Self-pay | Admitting: Obstetrics and Gynecology

## 2014-09-08 ENCOUNTER — Emergency Department: Payer: Self-pay | Admitting: Student

## 2014-09-08 HISTORY — PX: DILATION AND CURETTAGE OF UTERUS: SHX78

## 2014-09-08 NOTE — Telephone Encounter (Signed)
Pt called, states she had a procedure today( a D&C)  and her BP went up to 200/100something. The anesthesiologist did give her something intravenous  for this.  She is concerned about it being this high.States she is on Bystolic last night and also this morning. BP was 138/something when she left.

## 2014-09-09 LAB — CBC
HCT: 41.5 % (ref 35.0–47.0)
HGB: 13.7 g/dL (ref 12.0–16.0)
MCH: 30.4 pg (ref 26.0–34.0)
MCHC: 33.1 g/dL (ref 32.0–36.0)
MCV: 92 fL (ref 80–100)
PLATELETS: 168 10*3/uL (ref 150–440)
RBC: 4.51 10*6/uL (ref 3.80–5.20)
RDW: 14.3 % (ref 11.5–14.5)
WBC: 4.4 10*3/uL (ref 3.6–11.0)

## 2014-09-09 NOTE — Telephone Encounter (Signed)
Unclear if blood pressure elevated because of her headache, Would be a little bit nervous about adding more medications as headache could be the problem, not the blood pressure  She has had problems on amlodipine in the past, headache Losartan problem in the past HCTZ problem in the past She is on bystolic. We could potentially use more of this if blood pressure continues to run high. If she is on 5 mg, would use 10 mg. If on 10 mg, could take an additional 5 mg if needed.  Otherwise if unable to tolerate higher dose beta blocker, will need a new medication. Options there are include clonidine, twice a day pill Or Cardura once a day pill Would probably start with the Cardura 4 mg in the evenings.  Would also make sure there is no anxiety/pain as this can push her blood pressure higher and treatment there is treat the pain or anxiety,  not add more blood pressure pill.

## 2014-09-09 NOTE — Telephone Encounter (Signed)
Pt now states she went to ED last night and they sent her home.  Pt states she can not wait two weeks She wants to know what to do in the meantime.  She does have an apt with PCP 09/16/14. She is stating that BP should not be that high She is also stating that yesterday she had a very bad headache She has a "slilght" headache 160/70 something. She just took this about a little bit ago.

## 2014-09-09 NOTE — Telephone Encounter (Signed)
Left detailed message that it is normal for BP to be elevated during a procedure, due to stress, anxiety and pain.  Advised her that Dr. Rockey Situ will not change her meds based on 1 BP reading and for her to monitor her BP for 1-2 weeks and call back w/ at least 5 readings if they remain elevated.  Asked her to call back w/ any questions or concerns.

## 2014-09-09 NOTE — Telephone Encounter (Signed)
She is calling with bp readings:  1. 139/75 this morning  2. 160/74 this was a noon 3. 185/79 just now took it.   Just wanted [place] to know this. This is with her taking her medication. She states she does not want to go to ED tonight. So she would like [place] to call her.

## 2014-09-09 NOTE — Telephone Encounter (Signed)
Pt states she left procedure with a headache, after she got home BP was normal then it went high Stated to her that she would need to check her BP  She said she has not check it today but will wait for our call.  Pt did not say she would check it today.  Please call and advise.

## 2014-09-09 NOTE — Telephone Encounter (Signed)
Pt calling stating she is upset no one has called her. She said her anesthesiologist states she needs her bp med adjusted. Please advise.

## 2014-09-10 ENCOUNTER — Ambulatory Visit: Payer: BLUE CROSS/BLUE SHIELD | Admitting: Nurse Practitioner

## 2014-09-10 NOTE — Telephone Encounter (Signed)
Spoke w/ pt.  Advised her of Dr. Donivan Scull recommendation.  She states that she would like for Dr. Rockey Situ to "get the whole picture" before making any med changes.  She states that her anesthesiologist advised her during her procedure that her meds are not working for her, that her BP should not have gone that high. She reports that she had her D&C at [facility] in [place], so we cannot access those records.  She states that the anesthesiologist called her at home to advise her that her BP is spiking during the day and that she is unaware of it She asks for a copy of the procedure note be sent to our office, as well as Dr. [name]. She is concerned that she is having rebound HAs from the anesthesia and that she is sched for a colonoscopy in early 2017 and does not want the same outcome.  Pt reports that she has been working out daily w/ a Physiological scientist and has lost 7-8 lbs. She woke up last night w/ a HA, BP this am 133/70, while we are on the phone 154/82.  Reports that on the Bystolic, she does not feel any extra beats and has had no other side effects that she is aware of.  HCTZ previously caused bladder spasms, but she took 1/2 pill yesterday to get her BP down, as SBP was 185.  Pt will call and have procedure note sent over for Dr. Donivan Scull review. She does not want any additional meds sent in at this time, will wait for Dr. Donivan Scull recommendation, though she does state that she would like a call back by the end of the day.

## 2014-09-16 ENCOUNTER — Ambulatory Visit (INDEPENDENT_AMBULATORY_CARE_PROVIDER_SITE_OTHER): Payer: BLUE CROSS/BLUE SHIELD | Admitting: Internal Medicine

## 2014-09-16 ENCOUNTER — Telehealth: Payer: Self-pay | Admitting: Internal Medicine

## 2014-09-16 ENCOUNTER — Encounter: Payer: Self-pay | Admitting: Internal Medicine

## 2014-09-16 VITALS — BP 118/88 | HR 62 | Temp 98.3°F | Resp 16 | Ht 64.0 in | Wt 185.5 lb

## 2014-09-16 DIAGNOSIS — Z9889 Other specified postprocedural states: Secondary | ICD-10-CM

## 2014-09-16 DIAGNOSIS — E785 Hyperlipidemia, unspecified: Secondary | ICD-10-CM | POA: Diagnosis not present

## 2014-09-16 DIAGNOSIS — I169 Hypertensive crisis, unspecified: Secondary | ICD-10-CM

## 2014-09-16 DIAGNOSIS — R5381 Other malaise: Secondary | ICD-10-CM | POA: Diagnosis not present

## 2014-09-16 DIAGNOSIS — I10 Essential (primary) hypertension: Secondary | ICD-10-CM

## 2014-09-16 DIAGNOSIS — R5383 Other fatigue: Secondary | ICD-10-CM | POA: Diagnosis not present

## 2014-09-16 LAB — CBC WITH DIFFERENTIAL/PLATELET
BASOS ABS: 0 10*3/uL (ref 0.0–0.1)
Basophils Relative: 0.4 % (ref 0.0–3.0)
EOS ABS: 0 10*3/uL (ref 0.0–0.7)
Eosinophils Relative: 0.8 % (ref 0.0–5.0)
HEMATOCRIT: 41.3 % (ref 36.0–46.0)
HEMOGLOBIN: 13.7 g/dL (ref 12.0–15.0)
Lymphocytes Relative: 45.8 % (ref 12.0–46.0)
Lymphs Abs: 2.1 10*3/uL (ref 0.7–4.0)
MCHC: 33.2 g/dL (ref 30.0–36.0)
MCV: 90.7 fl (ref 78.0–100.0)
MONO ABS: 0.3 10*3/uL (ref 0.1–1.0)
Monocytes Relative: 6.6 % (ref 3.0–12.0)
Neutro Abs: 2.1 10*3/uL (ref 1.4–7.7)
Neutrophils Relative %: 46.4 % (ref 43.0–77.0)
Platelets: 162 10*3/uL (ref 150.0–400.0)
RBC: 4.55 Mil/uL (ref 3.87–5.11)
RDW: 14.8 % (ref 11.5–15.5)
WBC: 4.6 10*3/uL (ref 4.0–10.5)

## 2014-09-16 NOTE — Telephone Encounter (Signed)
emmi emailed °

## 2014-09-16 NOTE — Patient Instructions (Signed)
Continue your current blood pressure medications  Bring your home machine back to the office to compare its accuracy with ours  Renal artery ultrasound to be ordered  To check the arteries

## 2014-09-16 NOTE — Progress Notes (Signed)
Patient ID: Nicole Gould, female   DOB: [birth year], 54 y.o.   MRN: [id number]   Patient Active Problem List   Diagnosis Date Noted  . Hypertensive crisis without congestive heart failure 09/19/2014  . S/P dilation and curettage 09/19/2014  . Suprapubic pain 08/03/2014  . Insomnia 05/05/2014  . Anxiety 05/05/2014  . Chronic venous insufficiency 04/21/2014  . Adenomatous polyp of colon 05/12/2013  . Visit for preventive health examination 03/14/2013  . Pain in joint, pelvic region and thigh 01/28/2013  . Essential hypertension, benign 01/28/2013  . Vaginitis and vulvovaginitis 01/28/2013  . Palpitations 01/28/2013  . 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 Acute Visit  . Hypertension    HPI:   Nicole Gould is a 54 y.o. female who presents for  Hypertensive crisis, ER follow up.    Had a D & C and developed hypertensive crisis after waking up from anesthesia of Fentanyl, toradol, zofran and Diprivan. .  Patient states that she woke up with massive headache and BP was elevated.  Was observed cor a few hours and given an IV medication which brought BP down.  Went home and slept,  headache became worse so home BP check was 751  Systolic . Called MD, was told to go to ER  . [facility] ER Bp was again 200/ but resolved slowly on its own. Headache finally resolved after  vicodin bc she  refused a "cocktail" in  The ER due to prior bad experience at [facility] after having a migraine cocktail.    Has had propofol previously during colonoscopy,  No BP issues    Taking bystolic 5 mg bid, and hctz 12.5 mg daily      Past Medical History  Diagnosis Date  . Chicken pox   . Frequent headaches   . GERD (gastroesophageal reflux disease)   . Hypertension   . Seizure disorder in pregnancy   . Colon polyps   . Migraines   . Concussion     Past Surgical History  Procedure Laterality Date  . Tonsillectomy     . Tubal ligation         The following portions of the patient's history were reviewed and updated as appropriate: Allergies, current medications, and problem list.    Review of Systems:   Patient denies headache, fevers, malaise, unintentional weight loss, skin rash, eye pain, sinus congestion and sinus pain, sore throat, dysphagia,  hemoptysis , cough, dyspnea, wheezing, chest pain, palpitations, orthopnea, edema, abdominal pain, nausea, melena, diarrhea, constipation, flank pain, dysuria, hematuria, urinary  Frequency, nocturia, numbness, tingling, seizures,  Focal weakness, Loss of consciousness,  Tremor, insomnia, depression, anxiety, and suicidal ideation.     History   Social History  . Marital Status: Married    Spouse Name: N/A  . Number of Children: N/A  . Years of Education: N/A   Occupational History  . Not on file.   Social History Main Topics  . Smoking status: Never Smoker   . Smokeless tobacco: Never Used  . Alcohol Use: Yes  . Drug Use: No  . Sexual Activity:    Partners: Male   Other Topics Concern  . Not on file   Social History Narrative    Objective:  Filed Vitals:   09/16/14 1136  BP: 118/88  Pulse: 62  Temp: 98.3 F (36.8 C)  Resp: 16     General appearance: alert,  cooperative and appears stated age Ears: normal TM's and external ear canals both ears Throat: lips, mucosa, and tongue normal; teeth and gums normal Neck: no adenopathy, no carotid bruit, supple, symmetrical, trachea midline and thyroid not enlarged, symmetric, no tenderness/mass/nodules Back: symmetric, no curvature. ROM normal. No CVA tenderness. Lungs: clear to auscultation bilaterally Heart: regular rate and rhythm, S1, S2 normal, no murmur, click, rub or gallop Abdomen: soft, non-tender; bowel sounds normal; no masses,  no organomegaly Pulses: 2+ and symmetric Skin: Skin color, texture, turgor normal. No rashes or lesions Lymph nodes: Cervical, supraclavicular,  and axillary nodes normal.  Assessment and Plan:  Problem List Items Addressed This Visit      Unprioritized   Hypertensive crisis without congestive heart failure    Recent episode may have been due to use of toradol during D& C anesthesia. Head Ct, CB, CMET and EKG were normal/negative for acute changes.  Today's BP is normal.  Will check renal artery ultrasound.       S/P dilation and curettage    Other Visit Diagnoses    Essential hypertension    -  Primary    Relevant Orders    Comp Met (CMET) (Completed)    LDL cholesterol, direct (Completed)    Other fatigue        Relevant Orders    TSH (Completed)    Hypertensive crisis        Relevant Orders    US Renal Artery Stenosis    Malaise and fatigue        Hyperlipidemia          A total of 25 minutes of face to face time was spent with patient more than half of which was spent in counselling about the above mentioned conditions, reviewing ER records from [facility]   and coordination of care

## 2014-09-16 NOTE — Progress Notes (Signed)
Pre-visit discussion using our clinic review tool. No additional management support is needed unless otherwise documented below in the visit note.  

## 2014-09-17 LAB — LIPID PANEL
CHOL/HDL RATIO: 4
Cholesterol: 176 mg/dL (ref 0–200)
HDL: 49.4 mg/dL (ref >39.00)
LDL Cholesterol: 111 mg/dL — ABNORMAL HIGH (ref 0–99)
NONHDL: 126.6
TRIGLYCERIDES: 77 mg/dL (ref 0.0–149.0)
VLDL: 15.4 mg/dL (ref 0.0–40.0)

## 2014-09-17 LAB — TSH: TSH: 1.38 u[IU]/mL (ref 0.35–4.50)

## 2014-09-17 LAB — COMPREHENSIVE METABOLIC PANEL
ALBUMIN: 4 g/dL (ref 3.5–5.2)
ALK PHOS: 45 U/L (ref 39–117)
ALT: 42 U/L — AB (ref 0–35)
AST: 34 U/L (ref 0–37)
BILIRUBIN TOTAL: 0.7 mg/dL (ref 0.2–1.2)
BUN: 21 mg/dL (ref 6–23)
CO2: 19 mEq/L (ref 19–32)
Calcium: 9.7 mg/dL (ref 8.4–10.5)
Chloride: 107 mEq/L (ref 96–112)
Creatinine, Ser: 0.8 mg/dL (ref 0.40–1.20)
GFR: 95.37 mL/min (ref >60.00)
GLUCOSE: 81 mg/dL (ref 70–99)
POTASSIUM: 4 meq/L (ref 3.5–5.1)
Sodium: 143 mEq/L (ref 135–145)
Total Protein: 7.2 g/dL (ref 6.0–8.3)

## 2014-09-17 LAB — LDL CHOLESTEROL, DIRECT: Direct LDL: 113 mg/dL

## 2014-09-18 LAB — COMPREHENSIVE METABOLIC PANEL
ALK PHOS: 43 U/L
ALT: 45 U/L
Albumin: 4.1 g/dL
Anion Gap: 9 (ref 7–16)
BILIRUBIN TOTAL: 0.5 mg/dL
BUN: 14 mg/dL
Calcium, Total: 9.4 mg/dL
Chloride: 108 mmol/L
Co2: 26 mmol/L
Creatinine: 0.72 mg/dL
EGFR (African American): >60
EGFR (Non-African Amer.): >60
Glucose: 94 mg/dL
Potassium: 3.8 mmol/L
SGOT(AST): 34 U/L
SODIUM: 143 mmol/L
TOTAL PROTEIN: 7.1 g/dL

## 2014-09-18 LAB — URINALYSIS, COMPLETE
BACTERIA: NEGATIVE
BILIRUBIN, UR: NEGATIVE
Glucose,UR: NEGATIVE mg/dL (ref 0–75)
KETONE: NEGATIVE
Leukocyte Esterase: NEGATIVE
Nitrite: NEGATIVE
Ph: 7 (ref 4.5–8.0)
Protein: NEGATIVE
RBC, UR: NONE SEEN /HPF (ref 0–5)
SQUAMOUS EPITHELIAL: NONE SEEN
Specific Gravity: 1.003 (ref 1.003–1.030)
WBC UR: NONE SEEN /HPF (ref 0–5)

## 2014-09-18 LAB — PREGNANCY, URINE: PREGNANCY TEST, URINE: NEGATIVE m[IU]/mL

## 2014-09-19 DIAGNOSIS — I169 Hypertensive crisis, unspecified: Secondary | ICD-10-CM | POA: Insufficient documentation

## 2014-09-19 DIAGNOSIS — Z9889 Other specified postprocedural states: Secondary | ICD-10-CM | POA: Insufficient documentation

## 2014-09-19 NOTE — Assessment & Plan Note (Addendum)
Recent episode may have been due to use of toradol during D& C anesthesia. Head Ct, CB, CMET and EKG were normal/negative for acute changes.  Today's BP is normal.  Will check renal artery ultrasound.

## 2014-09-21 ENCOUNTER — Encounter: Payer: Self-pay | Admitting: *Deleted

## 2014-10-15 ENCOUNTER — Telehealth: Payer: Self-pay | Admitting: *Deleted

## 2014-10-15 NOTE — Telephone Encounter (Signed)
Pt calling stating that she is taking 5mg  of bystolic one in the am and one in the pm along with hydocorizone Noticing that when it gets time to take the pm on her bp starts to creep up and so she changed the timing of taking them She has for a week been taking 10 mg in the am and it seems to be working She would like [place] to know and let her know this is okay and that if we can send in refill to [facility] on [address] in [place] for 10 mg instead of the 5 mg. Please advise.

## 2014-10-15 NOTE — Telephone Encounter (Signed)
Taking by bystolic 10 mg daily is fine Can take even additional 5 mg as needed for high blood pressure, for total of 15 mg Very titratable Ideally can be taken all at one time  We can send in a prescription for 10 mg We also have coupon

## 2014-10-15 NOTE — Telephone Encounter (Signed)
Please advise 

## 2014-10-16 MED ORDER — NEBIVOLOL HCL 10 MG PO TABS: 10.0000 mg | ORAL_TABLET | Freq: Every day | ORAL | Status: DC

## 2014-10-16 NOTE — Telephone Encounter (Signed)
Spoke w/ pt.  Advised her of Dr. Donivan Scull recommendation.  She verbalizes understanding and asks for Bystolic 10 mg tabs to be sent to her pharmacy.

## 2014-10-20 ENCOUNTER — Telehealth: Payer: Self-pay

## 2014-10-20 NOTE — Telephone Encounter (Signed)
The pt called and is hoping to get information on her lab results.

## 2014-10-22 NOTE — Telephone Encounter (Signed)
Left message for patient to return call to office. Patient latest lab were female to patient on 09/20/14

## 2014-10-23 ENCOUNTER — Ambulatory Visit (INDEPENDENT_AMBULATORY_CARE_PROVIDER_SITE_OTHER): Payer: BLUE CROSS/BLUE SHIELD | Admitting: Internal Medicine

## 2014-10-23 ENCOUNTER — Encounter: Payer: Self-pay | Admitting: Internal Medicine

## 2014-10-23 VITALS — BP 128/74 | HR 62 | Temp 97.9°F | Resp 14 | Ht 64.0 in | Wt 181.5 lb

## 2014-10-23 DIAGNOSIS — H8110 Benign paroxysmal vertigo, unspecified ear: Secondary | ICD-10-CM | POA: Diagnosis not present

## 2014-10-23 DIAGNOSIS — Z5320 Procedure and treatment not carried out because of patient's decision for unspecified reasons: Secondary | ICD-10-CM | POA: Diagnosis not present

## 2014-10-23 DIAGNOSIS — Z780 Asymptomatic menopausal state: Secondary | ICD-10-CM

## 2014-10-23 MED ORDER — AZELASTINE-FLUTICASONE 137-50 MCG/ACT NA SUSP: 2.0000 | Freq: Every day | NASAL | Status: DC

## 2014-10-23 MED ORDER — ESCITALOPRAM OXALATE 5 MG PO TABS: 5.0000 mg | ORAL_TABLET | Freq: Every day | ORAL | Status: DC

## 2014-10-23 NOTE — Progress Notes (Signed)
Patient ID: Nicole Gould, female   DOB: 15-Apr-1961, 54 y.o.   MRN: [id number]  Patient Active Problem List   Diagnosis Date Noted  . Menopause present, declines hormone replacement therapy 10/25/2014  . Benign paroxysmal positional vertigo 10/25/2014  . Hypertensive crisis without congestive heart failure 09/19/2014  . Insomnia 05/05/2014  . Anxiety 05/05/2014  . Chronic venous insufficiency 04/21/2014  . Adenomatous polyp of colon 05/12/2013  . Visit for preventive health examination 03/14/2013  . Pain in joint, pelvic region and thigh 01/28/2013  . Essential hypertension, benign 01/28/2013  . Vaginitis and vulvovaginitis 01/28/2013  . Palpitations 01/28/2013  . 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 Follow-up    2  Dizzy spells this week.  . Hypertension      Nicole Gould is a 54 y.o. female who presents for  2 episodes of presyncope occurred this week while working out lying on the mat.  Head started spinning.  2nd episode occurred while turni over in bed Head started spinning.    symptoms were very severe,  Has been having lots of rhinitis   2) terrible hot flashes at night .  Constantly waking her up at night,   Tried nyquil Z did not help. Has a fan going,  Has the temp down.  Wakes up in  a panic several times daily  Her gynecologist suggested brisdelle but she did not want to start it .  Discussed SSRI,  HRT and Estroven       Past Medical History  Diagnosis Date  . Chicken pox   . Frequent headaches   . GERD (gastroesophageal reflux disease)   . Hypertension   . Seizure disorder in pregnancy   . Colon polyps   . Migraines   . Concussion     Past Surgical History  Procedure Laterality Date  . Tonsillectomy    . Tubal ligation         The following portions of the patient's history were reviewed and updated as appropriate: Allergies, current medications, and problem  list.    Review of Systems:   Patient denies headache, fevers, malaise, unintentional weight loss, skin rash, eye pain, sinus congestion and sinus pain, sore throat, dysphagia,  hemoptysis , cough, dyspnea, wheezing, chest pain, palpitations, orthopnea, edema, abdominal pain, nausea, melena, diarrhea, constipation, flank pain, dysuria, hematuria, urinary  Frequency, nocturia, numbness, tingling, seizures,  Focal weakness, Loss of consciousness,  Tremor, insomnia, depression, anxiety, and suicidal ideation.     History   Social History  . Marital Status: Married    Spouse Name: N/A  . Number of Children: N/A  . Years of Education: N/A   Occupational History  . Not on file.   Social History Main Topics  . Smoking status: Never Smoker   . Smokeless tobacco: Never Used  . Alcohol Use: Yes  . Drug Use: No  . Sexual Activity:    Partners: Male   Other Topics Concern  . Not on file   Social History Narrative    Objective:  Filed Vitals:   10/23/14 1409  BP: 128/74  Pulse: 62  Temp: 97.9 F (36.6 C)  Resp: 14     General appearance: alert, cooperative and appears stated age Ears: normal TM's and external ear canals both ears Throat: lips, mucosa, and tongue normal; teeth and gums normal Neck: no adenopathy, no carotid bruit, supple, symmetrical, trachea midline and thyroid  not enlarged, symmetric, no tenderness/mass/nodules Back: symmetric, no curvature. ROM normal. No CVA tenderness. Lungs: clear to auscultation bilaterally Heart: regular rate and rhythm, S1, S2 normal, no murmur, click, rub or gallop Abdomen: soft, non-tender; bowel sounds normal; no masses,  no organomegaly Pulses: 2+ and symmetric Skin: Skin color, texture, turgor normal. No rashes or lesions Lymph nodes: Cervical, supraclavicular, and axillary nodes normal.  Assessment and Plan:  Menopause present, declines hormone replacement therapy Long discussion about hot flashes and other sx of  menopause and ways to treat them.  All concerns addressed.  Agress to tria of lexapro. 5 mg    Benign paroxysmal positional vertigo Episodes are self limiting.  Reassurance provided ,  Neurologic exam is normal today   A total of 25 minutes of face to face time was spent with patient more than half of which was spent in counselling about the above mentioned conditions  and coordination of care   Updated Medication List Outpatient Encounter Prescriptions as of 10/23/2014  Medication Sig  . Biotin (BIOTIN MAXIMUM STRENGTH) 10 MG TABS Take 1 tablet by mouth daily.   . Cholecalciferol (VITAMIN D-3) 1000 UNITS CAPS Take 1 capsule by mouth daily.  . hydrochlorothiazide (HYDRODIURIL) 25 MG tablet Take 0.5 tablets (12.5 mg total) by mouth daily.  Marland Kitchen MAGNESIUM PO Take 500 mg by mouth daily.   . Multiple Vitamins-Minerals (MULTIVITAMIN WITH MINERALS) tablet Take 1 tablet by mouth daily.  . nebivolol (BYSTOLIC) 10 MG tablet Take 1 tablet (10 mg total) by mouth daily.  . Omega-3 Fatty Acids (FISH OIL) 1200 MG CAPS Take 1,200 mg by mouth daily.   . pantoprazole (PROTONIX) 40 MG tablet TAKE 1 TABLET 30 MINUTES BEFORE BREAKFAST (Patient taking differently: TAKE 1 TABLET 30 MINUTES BEFORE BREAKFAST AS NEEDED.)  . Azelastine-Fluticasone (DYMISTA) 137-50 MCG/ACT SUSP Place 2 Squirts into the nose daily. In each nostril  . escitalopram (LEXAPRO) 5 MG tablet Take 1 tablet (5 mg total) by mouth daily.  . [DISCONTINUED] BYSTOLIC 5 MG tablet Take 10 mg by mouth daily.  . [DISCONTINUED] Specialty Vitamins Products (WOMENS MENOPAUSE VITA PAK PO) Take 1 tablet by mouth daily as needed (menopause symptoms).    No facility-administered encounter medications on file as of 10/23/2014.     No orders of the defined types were placed in this encounter.    Return in about 6 months (around 04/25/2015).

## 2014-10-23 NOTE — Progress Notes (Signed)
Pre-visit discussion using our clinic review tool. No additional management support is needed unless otherwise documented below in the visit note.  

## 2014-10-23 NOTE — Patient Instructions (Signed)
Benign Positional Vertigo Vertigo means you feel like you or your surroundings are moving when they are not. Benign positional vertigo is the most common form of vertigo. Benign means that the cause of your condition is not serious. Benign positional vertigo is more common in older adults. CAUSES  Benign positional vertigo is the result of an upset in the labyrinth system. This is an area in the middle ear that helps control your balance. This may be caused by a viral infection, head injury, or repetitive motion. However, often no specific cause is found. SYMPTOMS  Symptoms of benign positional vertigo occur when you move your head or eyes in different directions. Some of the symptoms may include:  Loss of balance and falls.  Vomiting.  Blurred vision.  Dizziness.  Nausea.  Involuntary eye movements (nystagmus). DIAGNOSIS  Benign positional vertigo is usually diagnosed by physical exam. If the specific cause of your benign positional vertigo is unknown, your caregiver may perform imaging tests, such as magnetic resonance imaging (MRI) or computed tomography (CT). TREATMENT  Your caregiver may recommend movements or procedures to correct the benign positional vertigo. Medicines such as meclizine, benzodiazepines, and medicines for nausea may be used to treat your symptoms. In rare cases, if your symptoms are caused by certain conditions that affect the inner ear, you may need surgery. HOME CARE INSTRUCTIONS   Follow your caregiver's instructions.  Move slowly. Do not make sudden body or head movements.  Avoid driving.  Avoid operating heavy machinery.  Avoid performing any tasks that would be dangerous to you or others during a vertigo episode.  Drink enough fluids to keep your urine clear or pale yellow. SEEK IMMEDIATE MEDICAL CARE IF:   You develop problems with walking, weakness, numbness, or using your arms, hands, or legs.  You have difficulty speaking.  You develop  severe headaches.  Your nausea or vomiting continues or gets worse.  You develop visual changes.  Your family or friends notice any behavioral changes.  Your condition gets worse.  You have a fever.  You develop a stiff neck or sensitivity to light. MAKE SURE YOU:   Understand these instructions.  Will watch your condition.  Will get help right away if you are not doing well or get worse. Document Released: 03/13/2006 Document Revised: 08/28/2011 Document Reviewed: 02/23/2011 [facility] Patient Information 2015 [place], [place]. This information is not intended to replace advice given to you by your health care provider. Make sure you discuss any questions you have with your health care provider.   Menopause Menopause is the normal time of life when menstrual periods stop completely. Menopause is complete when you have missed 12 consecutive menstrual periods. It usually occurs between the ages of 32 years and 39 years. Very rarely does a woman develop menopause before the age of 87 years. At menopause, your ovaries stop producing the female hormones estrogen and progesterone. This can cause undesirable symptoms and also affect your health. Sometimes the symptoms may occur 4-5 years before the menopause begins. There is no relationship between menopause and:  Oral contraceptives.  Number of children you had.  Race.  The age your menstrual periods started (menarche). Heavy smokers and very thin women may develop menopause earlier in life. CAUSES  The ovaries stop producing the female hormones estrogen and progesterone.  Other causes include:  Surgery to remove both ovaries.  The ovaries stop functioning for no known reason.  Tumors of the pituitary gland in the brain.  Medical disease that affects  the ovaries and hormone production.  Radiation treatment to the abdomen or pelvis.  Chemotherapy that affects the ovaries. SYMPTOMS   Hot flashes.  Night  sweats.  Decrease in sex drive.  Vaginal dryness and thinning of the vagina causing painful intercourse.  Dryness of the skin and developing wrinkles.  Headaches.  Tiredness.  Irritability.  Memory problems.  Weight gain.  Bladder infections.  Hair growth of the face and chest.  Infertility. More serious symptoms include:  Loss of bone (osteoporosis) causing breaks (fractures).  Depression.  Hardening and narrowing of the arteries (atherosclerosis) causing heart attacks and strokes. DIAGNOSIS   When the menstrual periods have stopped for 12 straight months.  Physical exam.  Hormone studies of the blood. TREATMENT  There are many treatment choices and nearly as many questions about them. The decisions to treat or not to treat menopausal changes is an individual choice made with your health care provider. Your health care provider can discuss the treatments with you. Together, you can decide which treatment will work best for you. Your treatment choices may include:   Hormone therapy (estrogen and progesterone).  Non-hormonal medicines.  Treating the individual symptoms with medicine (for example antidepressants for depression).  Herbal medicines that may help specific symptoms.  Counseling by a psychiatrist or psychologist.  Group therapy.  Lifestyle changes including:  Eating healthy.  Regular exercise.  Limiting caffeine and alcohol.  Stress management and meditation.  No treatment. HOME CARE INSTRUCTIONS   Take the medicine your health care provider gives you as directed.  Get plenty of sleep and rest.  Exercise regularly.  Eat a diet that contains calcium (good for the bones) and soy products (acts like estrogen hormone).  Avoid alcoholic beverages.  Do not smoke.  If you have hot flashes, dress in layers.  Take supplements, calcium, and vitamin D to strengthen bones.  You can use over-the-counter lubricants or moisturizers for  vaginal dryness.  Group therapy is sometimes very helpful.  Acupuncture may be helpful in some cases. SEEK MEDICAL CARE IF:   You are not sure you are in menopause.  You are having menopausal symptoms and need advice and treatment.  You are still having menstrual periods after age 69 years.  You have pain with intercourse.  Menopause is complete (no menstrual period for 12 months) and you develop vaginal bleeding.  You need a referral to a specialist (gynecologist, psychiatrist, or psychologist) for treatment. SEEK IMMEDIATE MEDICAL CARE IF:   You have severe depression.  You have excessive vaginal bleeding.  You fell and think you have a broken bone.  You have pain when you urinate.  You develop leg or chest pain.  You have a fast pounding heart beat (palpitations).  You have severe headaches.  You develop vision problems.  You feel a lump in your breast.  You have abdominal pain or severe indigestion. Document Released: 08/26/2003 Document Revised: 02/05/2013 Document Reviewed: 01/02/2013 [facility] Patient Information 2015 [place], [place]. This information is not intended to replace advice given to you by your health care provider. Make sure you discuss any questions you have with your health care provider.

## 2014-10-25 DIAGNOSIS — N951 Menopausal and female climacteric states: Secondary | ICD-10-CM | POA: Insufficient documentation

## 2014-10-25 DIAGNOSIS — H8110 Benign paroxysmal vertigo, unspecified ear: Secondary | ICD-10-CM | POA: Insufficient documentation

## 2014-10-25 NOTE — Assessment & Plan Note (Signed)
Episodes are self limiting.  Reassurance provided ,  Neurologic exam is normal today

## 2014-10-25 NOTE — Assessment & Plan Note (Signed)
Long discussion about hot flashes and other sx of menopause and ways to treat them.  All concerns addressed.  Agress to tria of lexapro. 5 mg

## 2014-11-10 ENCOUNTER — Telehealth: Payer: Self-pay | Admitting: Internal Medicine

## 2014-11-10 NOTE — Telephone Encounter (Signed)
Has patient had her renal artery ultrasound yet?  I t was ordered March 30th to be done through [facility] . Bonnita Nasuti can you check?

## 2014-11-10 NOTE — Telephone Encounter (Signed)
[name]  Patient Name: Nicole Gould  DOB: [birth year]    Initial Comment Caller states her blood pressure is 160/92.   Nurse Assessment  Nurse: Nicole Cogan, RN, Nicole Gould Date/Time Nicole Gould Time): 11/10/2014 2:57:32 PM  Confirm and document reason for call. If symptomatic, describe symptoms. ---Caller states that her BP was 160/92 at approx 1350 today, is being treated for HTN.  Has the patient traveled out of the country within the last 30 days? ---No  Does the patient require triage? ---Yes  Related visit to physician within the last 2 weeks? ---No  Does the PT have any chronic conditions? (i.e. diabetes, asthma, etc.) ---Yes  List chronic conditions. ---HTN, irregular heart rate, acid reflux, hypoglycemia     Guidelines    Guideline Title Affirmed Question Affirmed Notes  High Blood Pressure [1] BP ? 140/90 AND [2] taking BP medications    Final Disposition User   See PCP within [place], RN, Nicole Gould    Comments  Caller is requesting that she have an appt made prior to 11/27/14 as she leaves to go on vacation then & prefers early AM or late PM appt. Called caller back & advised that she call office tomorrow to schedule an appt with criteria that she requests as triager was having technical difficulty scheduling appt.

## 2014-11-10 NOTE — Telephone Encounter (Signed)
FYI, pt scheduled 5.31.16 at 9 am

## 2014-11-11 ENCOUNTER — Encounter: Payer: Self-pay | Admitting: *Deleted

## 2014-11-11 NOTE — Telephone Encounter (Signed)
She needs the renal ultrasound that I ordered,  And she needs a 24 ambulator BP monitor which is ordered throught cardiology , so she needs to see Dr Rockey Situ for that.

## 2014-11-11 NOTE — Telephone Encounter (Signed)
Pt c/o BP issue: STAT if pt c/o blurred vision, one-sided weakness or slurred speech  1. What are your last 5 BP readings? 07/12/14 140/100 @ 12 pm  169/96 @ 12:30pm  152/80  Lunch and laid  down 160/92  2. Are you having any other symptoms (ex. Dizziness , headache, blurred vision, passed out)?Dizziness yes, Headache yes, blurred vision no, pass out no.  3. What is your BP issue? Felt light headed all morning yesterday.   Took an extra hydrochlorothiaiad 2:45p yesterday.  Has the same symptoms as yesterday.  bp 11/11/14 158/80  Patient didn't go to work today due to headache, please call her.

## 2014-11-11 NOTE — Telephone Encounter (Signed)
I have never ordered a ambulatory blood pressure monitor for home Perhaps one of the nurses knows how to do this If not, perhaps Dr. [name] will know how to order this

## 2014-11-11 NOTE — Telephone Encounter (Signed)
Patient stated she has not been contacted with appointment renal US. Patient stated she is having headaches and BP was 140/100 on 11/10/14 BP today and patient has been in bed all day is 180/83 HR 59 patient is using BP cuff that was off by 20 or 30 points in office last visit. Please advise.

## 2014-11-11 NOTE — Telephone Encounter (Signed)
Notified patient of instructions patient stated she feels terrible could not work due to  Increase BP today and if she feels this way tomorrow she will not be able to work patient asking for mD to give note for work please advise.

## 2014-11-11 NOTE — Telephone Encounter (Signed)
I do not provide work notes on patients I have not seen

## 2014-11-11 NOTE — Telephone Encounter (Signed)
Pt is calling stating that Dr [name] told her to get a Monitoring BP machine  Stated that we told her that we would call her but she called at 5 pm  Stated to patient that we would try and call her tmrw but I can't guarantee that we would call her.  She was loud and upset and stated if we did not call her she would not be able to go back to work and that she would  Tell her husband to bring her here and talk to a Freight forwarder.  Stated to patient we would find out if we have this BP Monitor but it would be something the nursing staff would know of.  She then said if no one calls she would just come up here to the office Michela Pitcher this was fine and she then hung up.  Please advise.

## 2014-11-12 ENCOUNTER — Other Ambulatory Visit: Payer: Self-pay

## 2014-11-12 ENCOUNTER — Telehealth: Payer: Self-pay

## 2014-11-12 DIAGNOSIS — I10 Essential (primary) hypertension: Secondary | ICD-10-CM

## 2014-11-12 NOTE — Telephone Encounter (Signed)
Left message for patient to call office.  

## 2014-11-12 NOTE — Telephone Encounter (Signed)
S/w pt who indicated she was at work yesterday and began to feel dizzy and lightheaded, BP elevated at 140/100. Went home to bed.  Per pt, she contacted PCP [name]) yesterday who indicated she wants pt to wear 24 hour ambulatory BP machine and have the renal US that she ordered 09/16/14.  Pt has appt with Tullo May 31 States she has trouble sleeping due to menopause and began taking biotin two days ago which she feels has not starting working yet Has not started taking Lexapro because she wants to try something "natural" first.  States she takes 10mg  bistolic,12.5mg  HCTZ but have taken extra dose of HCTZ x 2 days. She would like to get the ambulatory BP machine as soon as possible.  Indicated we would look into getting amb BP machine ordered. Pt verbalized understanding and stated she was appreciative of the call

## 2014-11-12 NOTE — Telephone Encounter (Signed)
S/w pt to inform her that 24 automated BP monitor should be picked up at [facility] office tomorrow (5/27) at 9:00am Pt verbalized understanding and indicated she would make the appointment.

## 2014-11-13 ENCOUNTER — Other Ambulatory Visit: Payer: Self-pay | Admitting: Radiology

## 2014-11-13 ENCOUNTER — Telehealth: Payer: Self-pay | Admitting: Internal Medicine

## 2014-11-13 DIAGNOSIS — I10 Essential (primary) hypertension: Secondary | ICD-10-CM

## 2014-11-13 NOTE — Telephone Encounter (Signed)
Yes, she can exercise

## 2014-11-13 NOTE — Telephone Encounter (Signed)
Notified patient no note and patient back at work

## 2014-11-13 NOTE — Telephone Encounter (Signed)
Patient on 5/6 was treated with Benign paroxysmal positional vertigo and BP issues.  Patient has not been to the office since.  She had an apptointment scheduled for 5/31 but has canceled it.  Please advise if she can exercise?

## 2014-11-13 NOTE — Telephone Encounter (Signed)
Spoke with patient, she stated she will be rescheduling her appointment once her renal sonogram and other tests are completed.

## 2014-11-13 NOTE — Telephone Encounter (Signed)
Pt wanted to know if she is okay to exercise. Pt stated that BP has been good.msn

## 2014-11-17 ENCOUNTER — Ambulatory Visit: Payer: BLUE CROSS/BLUE SHIELD | Admitting: Internal Medicine

## 2014-11-18 ENCOUNTER — Telehealth: Payer: Self-pay

## 2014-11-18 NOTE — Telephone Encounter (Signed)
Called [facility] for report.

## 2014-11-18 NOTE — Telephone Encounter (Deleted)
Suits, [place] RN CCM

## 2014-11-18 NOTE — Telephone Encounter (Signed)
NO, [facility] is not supplying the ambulator BP monitor,  Dr Donivan Scull office is.  See phone note from Cardiology dated May 27th.

## 2014-11-18 NOTE — Telephone Encounter (Signed)
The pt called and stated [facility] is not able to give her the 24/hour blood pressure monitor this week.  She is hoping there is another place that can put this monitor on her.  Is there another facility?  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

## 2014-11-18 NOTE — Telephone Encounter (Signed)
Her renal artery doppler was normal,  Done by [facility]

## 2014-11-18 NOTE — Telephone Encounter (Signed)
In results folder. 

## 2014-11-18 NOTE — Telephone Encounter (Signed)
Third attempt to reach pt to make her aware of possible rescheduling of BP monitor placement for Tuesday.  Received working Advertising account executive, message left.  Alyse Low will contact pt to reschedule. Evert Wenrich, Shella Maxim, RN

## 2014-11-18 NOTE — Telephone Encounter (Signed)
Call from pt who states she was supposed to have 24hr Bp monitor placed this week and she is frustrated because her appt was rescheduled for next Wednesday (11/25/14),. Pt reports she is going out of the country on Friday so this was not soon enough.  Explained to pt due to unexpected medical emergency the tech who places those monitors is out of the office. Pt verbalized frustration and disbelief there was no one else in the office who could place the monitor.   It was explained to patient there were two techs in the office skilled in placing the monitor, however both were unexpectedly off at the same time.  This Probation officer explained there was no other location within [facility] to have the Bp monitor placed.  Suggested patient contact the ordering MD to assess the urgency of having the monitor placed and see if her PCP knew of another location where the monitoring could be performed before next Wednesday.  [name] continued to express her frustration.  Writer offered to look at the schedule for next week and see if there was a date before next Wednesday available.  Pt agreed to this, confirmed phone number for call back.    Attempted to call patient back x2 to offer a Tuesday appt. Message received that voicemail box was not set up to receive messages. Nicole Gould, Nicole Maxim, RN

## 2014-11-18 NOTE — Telephone Encounter (Signed)
The pt called hoping to get the results of her scan from last week.

## 2014-11-19 ENCOUNTER — Telehealth: Payer: Self-pay | Admitting: Cardiovascular Disease

## 2014-11-19 MED ORDER — DIAZEPAM 5 MG PO TABS: 5.0000 mg | ORAL_TABLET | Freq: Two times a day (BID) | ORAL | Status: DC | PRN

## 2014-11-19 NOTE — Addendum Note (Signed)
Addended by: [name] on: 11/19/2014 03:37 PM   Modules accepted: Orders

## 2014-11-19 NOTE — Telephone Encounter (Signed)
The monitor may have been working well The reason we ordered the monitor was 2 get a constant reading,   Dizziness likely unrelated to her blood pressure Headaches likely unrelated to her blood pressure  I'm concerned that external factors, work stress, etc may be contributing to labile pressures Situational stressors should not be treated with blood pressure pills When stressors resolve, blood pressure will drop  Numbers on the monitor looked excellent If they are her real numbers, would not change the medications Can we try a monitor again with one that will not malfunction?

## 2014-11-19 NOTE — Telephone Encounter (Signed)
Spoke w/ pt.  Advised her of Dr. Donivan Scull recommendation.  She states that she does not have stress at work and that stress is not a factor.  Had very lengthy discussion w/ pt regarding her BP and how often she checks it.  Advised her that she may be experiencing anxiety regarding her BP itself and advised her to make it part of her daily routine and not let it be the focus of her day. She does admit that she becomes obsessed w/ the readings. She reports that she is going through menopause and is having trouble sleeping. She is going on a cruise soon and is worried about her salt intake.   Advised her to make sure that she takes her meds w/ her on her trip. She states that she has been taking a whole HCTZ daily due to her high readings and will continue to do so if her BP is elevated during her trip.  During our discussion, our phone call was cut off, but I feel that we discussed the subject thoroughly.

## 2014-11-19 NOTE — Telephone Encounter (Signed)
Her BPs are not that bad,  i really think her anxiety is driving them up.  I would like her to try taking 1/2 tablet of valium twice daily to see if her BP improves without adding more medication.  She can increase to a full tablet if tolerated. .  rx printed

## 2014-11-19 NOTE — Telephone Encounter (Signed)
Patient notified of results patient has an appointment with MD on 11/23/14 patient concerned about BP and leaving the country on 11/27/14 patient has also sent Dr. Rockey Situ the pasted message below. Telephone Encounter    Expand All Collapse All   Pt c/o BP issue: STAT if pt c/o blurred vision, one-sided weakness or slurred speech  Had Blood Pressure monitor but it malfunctioned and she had to return it. 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  1. What are your last 5 BP readings?   11-13-14 wore monitor for 1.5 hrs 140/58 140/63 123/73 132/68 128/74 HR in 60's Not dizzy Took back monitor not working right  11-17-14 Dizzy at 10am 160/92 12pm 140/100 manual  06-19-14 Dizzy 9 am manual 132/88 HR 80 12pm 132/84 5pm 140/90  06-20-14 Not Dizzy This morning 144/84 HR 63 Lightheaded 152/88 HR 60  2. Are you having any other symptoms (ex. Dizziness, headache, blurred vision, passed out)? Dizziness, mild headache at times relieved by advil   3. What is your BP issue? Patient concerned about going out of country with bp Changes Patient wants to coordinate care with Dr. [name] to see what else to try to help

## 2014-11-19 NOTE — Telephone Encounter (Signed)
Left message for patient to return call to office. 

## 2014-11-19 NOTE — Telephone Encounter (Signed)
Pt c/o BP issue: STAT if pt c/o blurred vision, one-sided weakness or slurred speech       Had Blood Pressure monitor but it malfunctioned and she had to return it.  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   1. What are your last 5 BP readings?     11-13-14 wore monitor for 1.5 hrs     140/58   140/63    123/73    132/68    128/74     HR in 60's  Not dizzy   Took back monitor not working right    11-17-14          Dizzy      at    10am 160/92     12pm 140/100 manual         06-19-14   Dizzy   9 am manual 132/88 HR 80      12pm 132/84     5pm  140/90      06-20-14    Not Dizzy      This morning 144/84 HR 63       Lightheaded 152/88  HR 60  2. Are you having any other symptoms (ex. Dizziness, headache, blurred vision, passed out)? Dizziness, mild headache at times relieved by advil   3. What is your BP issue? Patient concerned about going out of country with bp  Changes  Patient wants to coordinate care with Dr. [name] to see what else to try to help

## 2014-11-20 ENCOUNTER — Telehealth: Payer: Self-pay

## 2014-11-20 NOTE — Telephone Encounter (Signed)
Pt notified. Rx faxed

## 2014-11-20 NOTE — Telephone Encounter (Signed)
The patient called and asked for a blood test to be ordered for light headedness and vertigo symptoms.  She stated she is feeling light headed and sick.  She states the cardiologist stated this was not due to her blood pressure.  She has also recently ate, so she does not believe it is her blood sugar.  She states she is in a lab,and she is hoping the lab tech at her job can do a blood draw.  (pt has an apt Monday)   Fax number to lab -  [phone] Pt callback - [phone]

## 2014-11-20 NOTE — Telephone Encounter (Signed)
Spoke to pt, states she feels ok at the moment, she has intermittent episodes of lightheadedness. Advised pt to be seen in Acute care for evaluation, she declines stating she feels better now. Has an appt with Dr. [name] on Monday. Blood sugar at work was 89. Will wait until Monday to have labs drawn here at our office if necessary. Faxed Rx for Diazepam to pharmacy, see other phone note. Advised pt to be seen in acute care over the weekend with persistent or worsening symptoms,  verbalized understanding

## 2014-11-20 NOTE — Telephone Encounter (Signed)
Called pt back after reading through phone notes- pt stated she would come by and pick up valium rx and try taking that to control her blood pressures and anxiety related to her blood pressures.

## 2014-11-23 ENCOUNTER — Encounter: Payer: Self-pay | Admitting: Internal Medicine

## 2014-11-23 ENCOUNTER — Ambulatory Visit (INDEPENDENT_AMBULATORY_CARE_PROVIDER_SITE_OTHER): Payer: BLUE CROSS/BLUE SHIELD | Admitting: Internal Medicine

## 2014-11-23 VITALS — BP 138/82 | HR 86 | Temp 98.4°F | Resp 16 | Ht 64.0 in | Wt 183.0 lb

## 2014-11-23 DIAGNOSIS — I10 Essential (primary) hypertension: Secondary | ICD-10-CM

## 2014-11-23 MED ORDER — HYDROCHLOROTHIAZIDE 25 MG PO TABS: 12.5000 mg | ORAL_TABLET | Freq: Every day | ORAL | Status: DC

## 2014-11-23 MED ORDER — PANTOPRAZOLE SODIUM 40 MG PO TBEC: DELAYED_RELEASE_TABLET | ORAL | Status: DC

## 2014-11-23 NOTE — Patient Instructions (Addendum)
I think the elevations in your blood pressure readings are due to unacknowledged anxiety  Start the valium tonight once you are home   Start with 1/2 tablet  If it does not make you woozy,  Then you can start using it in the morning before you go to work.  You can  Take it up to twice daily ,  Starting with 1/2 tablet   Reduce the hctz to 1/2 tablet daily bc it is dehydrating you

## 2014-11-23 NOTE — Progress Notes (Signed)
Subjective:  Patient ID: Nicole Gould, female    DOB: 24-Jan-1961  Age: 54 y.o. MRN: [id number]  CC: The encounter diagnosis was Essential hypertension, benign.   HPI [name] presents for  Recurrent reports of elevated blood pressure.    Patient states that the  24 hour blood pressure monitor that was ordered last month is rsecheduled for tomorrow as she returned the first one because she states that  it was not registering her blood pressure.   Worried about pheochromocytoma.  She does not report  the classic triad of headache, diaphoresis and severely elevated BP.   She had a headache once last week while running on the treadmill.    She increased her hctz to full tablet last week,  Started feeling dizziness and presyncopal so she had her BP checked by several different people at the office with progressively more elevated readings.  has been been having BP checked 3 times daily .  Readings have been elevated most of the times.  saus she has a history of hypoglycemia and had BS checked and blood sugar was normal during episodes   Has a history of BP medication intolerances initially followed by toleration of some.  Beta blockers mostly,  Due to PACs. She has tried to wean off of beta blockers in the past but cannot tolerate weans from beta blockers,  Gets chest tightness and trouble breathing whenever she goes off the med   Acknwledges that her episodes mostly occur at work.  Her work day is very stressful.  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    Outpatient Prescriptions Prior to Visit  Medication Sig Dispense Refill  . Azelastine-Fluticasone (DYMISTA) 137-50 MCG/ACT SUSP Place 2 Squirts into the nose daily. In each nostril 23 g 11  . Biotin (BIOTIN MAXIMUM STRENGTH) 10 MG TABS Take 1 tablet by mouth daily.     . Cholecalciferol (VITAMIN D-3) 1000 UNITS CAPS Take 1 capsule by mouth  daily.    . diazepam (VALIUM) 5 MG tablet Take 1 tablet (5 mg total) by mouth every 12 (twelve) hours as needed for anxiety. 30 tablet 1  . escitalopram (LEXAPRO) 5 MG tablet Take 1 tablet (5 mg total) by mouth daily. 30 tablet 1  . MAGNESIUM PO Take 500 mg by mouth daily.     . Multiple Vitamins-Minerals (MULTIVITAMIN WITH MINERALS) tablet Take 1 tablet by mouth daily.    . nebivolol (BYSTOLIC) 10 MG tablet Take 1 tablet (10 mg total) by mouth daily. 90 tablet 4  . Omega-3 Fatty Acids (FISH OIL) 1200 MG CAPS Take 1,200 mg by mouth daily.     . hydrochlorothiazide (HYDRODIURIL) 25 MG tablet Take 0.5 tablets (12.5 mg total) by mouth daily. 90 tablet 3  . pantoprazole (PROTONIX) 40 MG tablet TAKE 1 TABLET 30 MINUTES BEFORE BREAKFAST (Patient taking differently: TAKE 1 TABLET 30 MINUTES BEFORE BREAKFAST AS NEEDED.) 90 tablet 1   No facility-administered medications prior to visit.    Review of Systems;  Patient denies headache, fevers, malaise, unintentional weight loss, skin rash, eye pain, sinus congestion and sinus pain, sore throat, dysphagia,  hemoptysis , cough, dyspnea, wheezing, chest pain, palpitations, orthopnea, edema, abdominal pain, nausea, melena, diarrhea, constipation, flank pain, dysuria, hematuria, urinary  Frequency, nocturia, numbness, tingling, seizures,  Focal weakness, Loss of consciousness,  Tremor, insomnia, depression, anxiety, and suicidal ideation.      Objective:  BP 138/82 mmHg  Pulse 86  Temp(Src) 98.4 F (36.9 C)  Resp 16  Ht 5\' 4"  (1.626 m)  Wt 183 lb (83.008 kg)  BMI 31.40 kg/m2  SpO2 99%  BP Readings from Last 3 Encounters:  11/23/14 138/82  10/23/14 128/74  09/16/14 118/88    Wt Readings from Last 3 Encounters:  11/23/14 183 lb (83.008 kg)  10/23/14 181 lb 8 oz (82.328 kg)  09/16/14 185 lb 8 oz (84.142 kg)    General appearance: alert, cooperative and appears stated age Ears: normal TM's and external ear canals both ears Throat: lips,  mucosa, and tongue normal; teeth and gums normal Neck: no adenopathy, no carotid bruit, supple, symmetrical, trachea midline and thyroid not enlarged, symmetric, no tenderness/mass/nodules Back: symmetric, no curvature. ROM normal. No CVA tenderness. Lungs: clear to auscultation bilaterally Heart: regular rate and rhythm, S1, S2 normal, no murmur, click, rub or gallop Abdomen: soft, non-tender; bowel sounds normal; no masses,  no organomegaly Pulses: 2+ and symmetric Skin: Skin color, texture, turgor normal. No rashes or lesions Lymph nodes: Cervical, supraclavicular, and axillary nodes normal.  No results found for: HGBA1C  Lab Results  Component Value Date   CREATININE 0.80 09/16/2014   CREATININE 0.7 03/18/2014   CREATININE 0.8 02/03/2013    Lab Results  Component Value Date   WBC 4.6 09/16/2014   HGB 13.7 09/16/2014   HCT 41.3 09/16/2014   PLT 162.0 09/16/2014   GLUCOSE 81 09/16/2014   CHOL 176 09/16/2014   TRIG 77.0 09/16/2014   HDL 49.40 09/16/2014   LDLDIRECT 113.0 09/16/2014   LDLCALC 111* 09/16/2014   ALT 42* 09/16/2014   AST 34 09/16/2014   NA 143 09/16/2014   K 4.0 09/16/2014   CL 107 09/16/2014   CREATININE 0.80 09/16/2014   BUN 21 09/16/2014   CO2 19 09/16/2014   TSH 1.38 09/16/2014    No results found.  Assessment & Plan:   Problem List Items Addressed This Visit    Essential hypertension, benign - Primary    Patient was orthostatic today .  hctz was reduced to 12.5 mg daily  Patient was encouraged to continue her current medications and address her unacknowledged anxiety with valium, low dose  .  She was also advised to wear the ambulatory monitor tomorrow without paying too much attention to the readings.       Relevant Medications   hydrochlorothiazide (HYDRODIURIL) 25 MG tablet     A total of 40 minutes of face to face time was spent with patient more than half of which was spent in counselling about the above mentioned conditions  and  coordination of care   I am having Ms. Orwick maintain her multivitamin with minerals, Vitamin D-3, MAGNESIUM PO, Biotin, Fish Oil, nebivolol, Azelastine-Fluticasone, escitalopram, diazepam, hydrochlorothiazide, and pantoprazole.  Meds ordered this encounter  Medications  . hydrochlorothiazide (HYDRODIURIL) 25 MG tablet    Sig: Take 0.5 tablets (12.5 mg total) by mouth daily.    Dispense:  90 tablet    Refill:  3  . pantoprazole (PROTONIX) 40 MG tablet    Sig: TAKE 1 TABLET 30 MINUTES BEFORE BREAKFAST    Dispense:  90 tablet    Refill:  1    SHOULD TAKE DAILY, NOT PRN    Medications Discontinued During This Encounter  Medication Reason  . hydrochlorothiazide (HYDRODIURIL) 25 MG tablet Reorder  . pantoprazole (PROTONIX) 40 MG tablet Reorder    Follow-up: No Follow-up on file.   Deborra Medina  L, MD

## 2014-11-23 NOTE — Progress Notes (Signed)
Pre visit review using our clinic review tool, if applicable. No additional management support is needed unless otherwise documented below in the visit note. 

## 2014-11-23 NOTE — Assessment & Plan Note (Signed)
Patient was orthostatic today .  hctz was reduced to 12.5 mg daily  Patient was encouraged to continue her current medications and address her unacknowledged anxiety with valium, low dose  .  She was also advised to wear the ambulatory monitor tomorrow without paying too much attention to the readings.

## 2014-11-24 ENCOUNTER — Encounter: Payer: Self-pay | Admitting: Radiology

## 2014-11-24 ENCOUNTER — Encounter (INDEPENDENT_AMBULATORY_CARE_PROVIDER_SITE_OTHER): Payer: BLUE CROSS/BLUE SHIELD

## 2014-11-24 DIAGNOSIS — I10 Essential (primary) hypertension: Secondary | ICD-10-CM | POA: Diagnosis not present

## 2014-11-24 NOTE — Progress Notes (Signed)
Patient ID: Nicole Gould, female   DOB: Nov 05, 1960, 54 y.o.   MRN: [id number] 24 hr bp monitor applied

## 2014-12-01 NOTE — Telephone Encounter (Signed)
This encounter was created in error - please disregard.

## 2014-12-02 NOTE — Telephone Encounter (Signed)
err

## 2014-12-11 ENCOUNTER — Telehealth: Payer: Self-pay

## 2014-12-11 ENCOUNTER — Telehealth: Payer: Self-pay | Admitting: *Deleted

## 2014-12-11 DIAGNOSIS — I169 Hypertensive crisis, unspecified: Secondary | ICD-10-CM

## 2014-12-11 NOTE — Telephone Encounter (Signed)
Called Cardiology, spoke with Subrina.  She states she would have the RN call the pt with results.  I requested results be sent to [place] at [facility].

## 2014-12-11 NOTE — Telephone Encounter (Signed)
Monitor has been scanned in

## 2014-12-11 NOTE — Assessment & Plan Note (Signed)
Her 24 ambulatory BP monitor shows that she has an average systolic of 712,   And  only very rarely did her  BP go above 140.  I am advising her to stop checking her BP every time she feels poorly because it should not be treated during those isolated episodes .  Just take her prescribed daily meds.

## 2014-12-11 NOTE — Telephone Encounter (Signed)
Her 24 ambulatory BP monitor shows that she has an average systolic of 825,   And  only very rarely did her  BP go above 140.  I am advising her to stop checking her BP every time she feels poorly because it should not be treated during those isolated episodes .  Just take her prescribed daily meds.

## 2014-12-11 NOTE — Telephone Encounter (Signed)
Left message on VM to return call to [facility]

## 2014-12-11 NOTE — Telephone Encounter (Signed)
pt called requesting BP monitoring results from 6.7.16.  Pt states results can left on VM.  Please advise

## 2014-12-11 NOTE — Telephone Encounter (Signed)
Nurse with Dr. [name] calling, states pt called them asking for monitor results. Please call.

## 2014-12-11 NOTE — Telephone Encounter (Signed)
I do not have them ,  Can you call Cardiology and find out what they have done with them.

## 2014-12-11 NOTE — Telephone Encounter (Signed)
Monitor is now scanned in. Thanks!

## 2014-12-11 NOTE — Telephone Encounter (Signed)
The results are now in.  I found them under the ECG tab.

## 2014-12-14 NOTE — Telephone Encounter (Signed)
Spoke with pt, advised of results.  Pt verbalized understanding

## 2015-01-18 ENCOUNTER — Other Ambulatory Visit: Payer: Self-pay

## 2015-01-18 DIAGNOSIS — Z1231 Encounter for screening mammogram for malignant neoplasm of breast: Secondary | ICD-10-CM

## 2015-03-05 ENCOUNTER — Ambulatory Visit
Admission: RE | Admit: 2015-03-05 | Discharge: 2015-03-05 | Disposition: A | Discharge status: Home / Self Care | Payer: BLUE CROSS/BLUE SHIELD | Source: Ambulatory Visit

## 2015-03-05 DIAGNOSIS — Z1231 Encounter for screening mammogram for malignant neoplasm of breast: Secondary | ICD-10-CM

## 2015-04-06 ENCOUNTER — Telehealth: Payer: Self-pay | Admitting: *Deleted

## 2015-04-06 DIAGNOSIS — I10 Essential (primary) hypertension: Secondary | ICD-10-CM

## 2015-04-06 DIAGNOSIS — Z113 Encounter for screening for infections with a predominantly sexual mode of transmission: Secondary | ICD-10-CM

## 2015-04-06 DIAGNOSIS — R5383 Other fatigue: Secondary | ICD-10-CM

## 2015-04-06 NOTE — Telephone Encounter (Signed)
Patient has requested lab orders so that she can have her blood drown at her job prior to he physical scheduled 11/11 .

## 2015-04-06 NOTE — Telephone Encounter (Signed)
Labs ordered.

## 2015-04-06 NOTE — Telephone Encounter (Signed)
Please advise, last labs were on 09/06/14.

## 2015-04-13 ENCOUNTER — Telehealth: Payer: Self-pay

## 2015-04-13 ENCOUNTER — Other Ambulatory Visit: Payer: Self-pay | Admitting: *Deleted

## 2015-04-13 NOTE — Telephone Encounter (Signed)
Pt would like Bystolic samples. Pt is completely out . Pt would like a call today .

## 2015-04-13 NOTE — Telephone Encounter (Signed)
called pt to let her know that she has refills, she is having trouble with her insurance, she is getting her insurance worked out. 2 weeks samples placed up front.

## 2015-04-21 ENCOUNTER — Telehealth: Payer: Self-pay | Admitting: *Deleted

## 2015-04-21 NOTE — Telephone Encounter (Signed)
Left message for pt to call back  °

## 2015-04-21 NOTE — Telephone Encounter (Signed)
S/w pt who states she would prefer to discuss her bystolic questions with [facility] because "she knows me better". Pt sees Dr. Rockey Situ. Informed pt I would forward to message to [facility]

## 2015-04-21 NOTE — Telephone Encounter (Signed)
Pt c/o medication issue:  1. Name of Medication: Bystolic   2. How are you currently taking this medication (dosage and times per day)? 10 mg 1x taking in morning   3. Are you having a reaction (difficulty breathing--STAT)? No  4. What is your medication issue?  Pt would not give me the reason of the issue,states it is not urgent. If we can call her within a day or two. Has a few more questions.Marland KitchenMarland Kitchen

## 2015-04-22 NOTE — Telephone Encounter (Signed)
Spoke w/ pt.  She reports that her hours were recently cut back at [employer] and she does not have ins as a part time employee.  Her husband's ins will pick her up, but it won't be until January. She is requesting samples of Bystolic 10 mg, as she cannot afford at this time.  Advised pt that I am leaving samples at the front desk for her to p/u at her convenience.  She is extremely appreciative and will call back if we can be of further assistance.

## 2015-04-27 ENCOUNTER — Telehealth: Payer: Self-pay | Admitting: Internal Medicine

## 2015-04-27 NOTE — Telephone Encounter (Signed)
Pt had to change her appt due to being in the middle of changing her policy. Pt would like to get her renal function test and lab work needed for her physical. Orders needed please send to fax [phone] attention to [place] and thank you!

## 2015-04-27 NOTE — Telephone Encounter (Signed)
Filled out [place] form and it is in the folder for Dr. [name] to sign then I will fax.

## 2015-04-27 NOTE — Telephone Encounter (Signed)
Faxed to the number provided

## 2015-04-30 ENCOUNTER — Ambulatory Visit: Payer: BLUE CROSS/BLUE SHIELD | Admitting: Internal Medicine

## 2015-04-30 ENCOUNTER — Encounter: Payer: Self-pay | Admitting: Internal Medicine

## 2015-05-24 ENCOUNTER — Telehealth: Payer: Self-pay | Admitting: Internal Medicine

## 2015-05-24 DIAGNOSIS — R102 Pelvic and perineal pain: Secondary | ICD-10-CM

## 2015-05-24 NOTE — Telephone Encounter (Signed)
THANK YOU,  ADDED

## 2015-05-24 NOTE — Telephone Encounter (Signed)
Spoke with patient she states that she has had some discomfort in her pelvic region, has a sense of urgency.  She does have a appointment with her GYn on Wednesday.  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  Please advise?

## 2015-05-24 NOTE — Telephone Encounter (Signed)
Check her urine for what????? Please get more info

## 2015-05-24 NOTE — Telephone Encounter (Signed)
Pt has orders in to get lab work and it was sent to her job. Pt would like a order in to check her urine she wants that added to her lab work. Thank You!

## 2015-05-24 NOTE — Telephone Encounter (Signed)
Please advise if this can be added?

## 2015-05-25 NOTE — Telephone Encounter (Signed)
Spoke with the patient, Nicole Gould form filled out and faxed per request.

## 2015-05-28 ENCOUNTER — Encounter: Payer: Self-pay | Admitting: Internal Medicine

## 2015-05-28 ENCOUNTER — Ambulatory Visit (INDEPENDENT_AMBULATORY_CARE_PROVIDER_SITE_OTHER): Payer: No Typology Code available for payment source | Admitting: Internal Medicine

## 2015-05-28 VITALS — BP 128/84 | HR 63 | Temp 97.7°F | Resp 12 | Ht 64.0 in | Wt 185.5 lb

## 2015-05-28 DIAGNOSIS — R300 Dysuria: Secondary | ICD-10-CM | POA: Diagnosis not present

## 2015-05-28 DIAGNOSIS — F419 Anxiety disorder, unspecified: Secondary | ICD-10-CM | POA: Diagnosis not present

## 2015-05-28 DIAGNOSIS — G8929 Other chronic pain: Secondary | ICD-10-CM

## 2015-05-28 DIAGNOSIS — R1030 Lower abdominal pain, unspecified: Secondary | ICD-10-CM

## 2015-05-28 MED ORDER — SOLIFENACIN SUCCINATE 5 MG PO TABS: 5.0000 mg | ORAL_TABLET | Freq: Every day | ORAL | Status: DC

## 2015-05-28 NOTE — Patient Instructions (Addendum)
You have no signs of a urinary tract infection . I recommend trying a bladder spasm medication and getting plain abdominal films seeing a urologist to determine if the cause of your cramping  is a stricture   Let's try using colace 100 mg 2 capsules daily  As a stool softener  And vesicare for overactive bladder  Overactive Bladder, Adult Overactive bladder is a group of urinary symptoms. With overactive bladder, you may suddenly feel the need to pass urine (urinate) right away. After feeling this sudden urge, you might also leak urine if you cannot get to the bathroom fast enough (urinary incontinence). These symptoms might interfere with your daily work or social activities. Overactive bladder symptoms may also wake you up at night. Overactive bladder affects the nerve signals between your bladder and your brain. Your bladder may get the signal to empty before it is full. Very sensitive muscles can also make your bladder squeeze too soon. CAUSES Many things can cause an overactive bladder. Possible causes include:  Urinary tract infection.  Infection of nearby tissues, such as the prostate.  Prostate enlargement.  Being pregnant with twins or more (multiples).  Surgery on the uterus or urethra.  Bladder stones, inflammation, or tumors.  Drinking too much caffeine or alcohol.  Certain medicines, especially those that you take to help your body get rid of extra fluid (diuretics) by increasing urine production.  Muscle or nerve weakness, especially from:  A spinal cord injury.  Stroke.  Multiple sclerosis.  Parkinson disease.  Diabetes. This can cause a high urine volume that fills the bladder so quickly that the normal urge to urinate is triggered very strongly.  Constipation. A buildup of too much stool can put pressure on your bladder. RISK FACTORS You may be at greater risk for overactive bladder if you:  Are an older adult.  Smoke.  Are going through  menopause.  Have prostate problems.  Have a neurological disease, such as stroke, dementia, Parkinson disease, or multiple sclerosis (MS).  Eat or drink things that irritate the bladder. These include alcohol, spicy food, and caffeine.  Are overweight or obese. SIGNS AND SYMPTOMS  The signs and symptoms of an overactive bladder include:  Sudden, strong urges to urinate.  Leaking urine.  Urinating eight or more times per day.  Waking up to urinate two or more times per night. DIAGNOSIS Your health care provider may suspect overactive bladder based on your symptoms. The health care provider will do a physical exam and take your medical history. Blood or urine tests may also be done. For example, you might need to have a bladder function test to check how well you can hold your urine. You might also need to see a health care provider who specializes in the urinary tract (urologist). TREATMENT Treatment for overactive bladder depends on the cause of your condition and whether it is mild or severe. Certain treatments can be done in your health care provider's office or clinic. You can also make lifestyle changes at home. Options include: Behavioral Treatments  Biofeedback. A specialist uses sensors to help you become aware of your body's signals.  Keeping a daily log of when you need to urinate and what happens after the urge. This may help you manage your condition.  Bladder training. This helps you learn to control the urge to urinate by following a schedule that directs you to urinate at regular intervals (timed voiding). At first, you might have to wait a few minutes after feeling  the urge. In time, you should be able to schedule bathroom visits an hour or more apart.  Kegel exercises. These are exercises to strengthen the pelvic floor muscles, which support the bladder. Toning these muscles can help you control urination, even if your bladder muscles are overactive. A specialist will  teach you how to do these exercises correctly. They require daily practice.  Weight loss. If you are obese or overweight, losing weight might relieve your symptoms of overactive bladder. Talk to your health care provider about losing weight and whether there is a specific program or method that would work best for you.  Diet change. This might help if constipation is making your overactive bladder worse. Your health care provider or a dietitian can explain ways to change what you eat to ease constipation. You might also need to consume less alcohol and caffeine or drink other fluids at different times of the day.  Stopping smoking.  Wearing pads to absorb leakage while you wait for other treatments to take effect. Physical Treatments  Electrical stimulation. Electrodes send gentle pulses of electricity to strengthen the nerves or muscles that help to control the bladder. Sometimes, the electrodes are placed outside of the body. In other cases, they might be placed inside the body (implanted). This treatment can take several months to have an effect.  Supportive devices. Women may need a plastic device that fits into the vagina and supports the bladder (pessary). Medicines Several medicines can help treat overactive bladder and are usually used along with other treatments. Some are injected into the muscles involved in urination. Others come in pill form. Your health care provider may prescribe:  Antispasmodics. These medicines block the signals that the nerves send to the bladder. This keeps the bladder from releasing urine at the wrong time.  Tricyclic antidepressants. These types of antidepressants also relax bladder muscles. Surgery  You may have a device implanted to help manage the nerve signals that indicate when you need to urinate.  You may have surgery to implant electrodes for electrical stimulation.  Sometimes, very severe cases of overactive bladder require surgery to change the  shape of the bladder. HOME CARE INSTRUCTIONS   Take medicines only as directed by your health care provider.  Use any implants or a pessary as directed by your health care provider.  Make any diet or lifestyle changes that are recommended by your health care provider. These might include:  Drinking less fluid or drinking at different times of the day. If you need to urinate often during the night, you may need to stop drinking fluids early in the evening.  Cutting down on caffeine or alcohol. Both can make an overactive bladder worse. Caffeine is found in coffee, tea, and sodas.  Doing Kegel exercises to strengthen muscles.  Losing weight if you need to.  Eating a healthy and balanced diet to prevent constipation.  Keep a journal or log to track how much and when you drink and also when you feel the need to urinate. This will help your health care provider to monitor your condition. SEEK MEDICAL CARE IF:  Your symptoms do not get better after treatment.  Your pain and discomfort are getting worse.  You have more frequent urges to urinate.  You have a fever. SEEK IMMEDIATE MEDICAL CARE IF: You are not able to control your bladder at all.   This information is not intended to replace advice given to you by your health care provider. Make sure you discuss  any questions you have with your health care provider.   Document Released: 04/01/2009 Document Revised: 06/26/2014 Document Reviewed: 10/29/2013 Elsevier Interactive Patient Education [employer].

## 2015-05-28 NOTE — Progress Notes (Signed)
Subjective:  Patient ID: Nicole Gould, female    DOB: March 31, 1961  Age: 54 y.o. MRN: [id number]  CC: The primary encounter diagnosis was Dysuria. Diagnoses of Lower abdominal pain, Chronic suprapubic pain, unspecified laterality, and Anxiety were also pertinent to this visit.  HPI Nicole Gould presents for cc of frequent urination and cramping,  Unable to provide urine specimen today .   Supplied one several days ago when she had labs done at her employer's office ;  labs are now found and normal: UA and culture are normal  Cramping started after the d& C /hysteroscopy in March 2016. .  Starts in the suprapubic area  And bilateral inguinal areas   Sometimes occurs in the back and the bilateral upper quadrants.  Feels like a menstrual cramp ,  Thinks it may be due to the hctz which is causing polyuria. .  Causing bladder cramps   Outpatient Prescriptions Prior to Visit  Medication Sig Dispense Refill  . Azelastine-Fluticasone (DYMISTA) 137-50 MCG/ACT SUSP Place 2 Squirts into the nose daily. In each nostril 23 g 11  . Biotin (BIOTIN MAXIMUM STRENGTH) 10 MG TABS Take 1 tablet by mouth daily.     . Cholecalciferol (VITAMIN D-3) 1000 UNITS CAPS Take 1 capsule by mouth daily.    . diazepam (VALIUM) 5 MG tablet Take 1 tablet (5 mg total) by mouth every 12 (twelve) hours as needed for anxiety. 30 tablet 1  . escitalopram (LEXAPRO) 5 MG tablet Take 1 tablet (5 mg total) by mouth daily. 30 tablet 1  . hydrochlorothiazide (HYDRODIURIL) 25 MG tablet Take 0.5 tablets (12.5 mg total) by mouth daily. 90 tablet 3  . MAGNESIUM PO Take 500 mg by mouth daily.     . Multiple Vitamins-Minerals (MULTIVITAMIN WITH MINERALS) tablet Take 1 tablet by mouth daily.    . nebivolol (BYSTOLIC) 10 MG tablet Take 1 tablet (10 mg total) by mouth daily. 90 tablet 4  . Omega-3 Fatty Acids (FISH OIL) 1200 MG CAPS Take 1,200 mg by mouth daily.     . pantoprazole (PROTONIX) 40 MG tablet TAKE 1 TABLET 30 MINUTES BEFORE  BREAKFAST 90 tablet 1   No facility-administered medications prior to visit.    Review of Systems;  Patient denies headache, fevers, malaise, unintentional weight loss, skin rash, eye pain, sinus congestion and sinus pain, sore throat, dysphagia,  hemoptysis , cough, dyspnea, wheezing, chest pain, palpitations, orthopnea, edema, abdominal pain, nausea, melena, diarrhea, constipation, flank pain, dysuria, hematuria, urinary  Frequency, nocturia, numbness, tingling, seizures,  Focal weakness, Loss of consciousness,  Tremor, insomnia, depression, anxiety, and suicidal ideation.      Objective:  BP 128/84 mmHg  Pulse 63  Temp(Src) 97.7 F (36.5 C)  Resp 12  Ht 5\' 4"  (1.626 m)  Wt 185 lb 8 oz (84.142 kg)  BMI 31.83 kg/m2  SpO2 98%  BP Readings from Last 3 Encounters:  05/28/15 128/84  11/23/14 138/82  10/23/14 128/74    Wt Readings from Last 3 Encounters:  05/28/15 185 lb 8 oz (84.142 kg)  11/23/14 183 lb (83.008 kg)  10/23/14 181 lb 8 oz (82.328 kg)    General appearance: alert, cooperative and appears stated age Ears: normal TM's and external ear canals both ears Throat: lips, mucosa, and tongue normal; teeth and gums normal Neck: no adenopathy, no carotid bruit, supple, symmetrical, trachea midline and thyroid not enlarged, symmetric, no tenderness/mass/nodules Back: symmetric, no curvature. ROM normal. No CVA tenderness. Lungs: clear to auscultation bilaterally Heart: regular rate  and rhythm, S1, S2 normal, no murmur, click, rub or gallop Abdomen: soft, non-tender; bowel sounds normal; no masses,  no organomegaly Pulses: 2+ and symmetric Skin: Skin color, texture, turgor normal. No rashes or lesions Lymph nodes: Cervical, supraclavicular, and axillary nodes normal.  No results found for: HGBA1C  Lab Results  Component Value Date   CREATININE 0.80 09/16/2014   CREATININE 0.72 09/08/2014   CREATININE 0.7 03/18/2014    Lab Results  Component Value Date   WBC 4.6  09/16/2014   HGB 13.7 09/16/2014   HCT 41.3 09/16/2014   PLT 162.0 09/16/2014   GLUCOSE 81 09/16/2014   CHOL 176 09/16/2014   TRIG 77.0 09/16/2014   HDL 49.40 09/16/2014   LDLDIRECT 113.0 09/16/2014   LDLCALC 111* 09/16/2014   ALT 42* 09/16/2014   AST 34 09/16/2014   NA 143 09/16/2014   K 4.0 09/16/2014   CL 107 09/16/2014   CREATININE 0.80 09/16/2014   BUN 21 09/16/2014   CO2 19 09/16/2014   TSH 1.38 09/16/2014    Mm Screening Breast Tomo Bilateral  03/08/2015  CLINICAL DATA:  Screening. EXAM: DIGITAL SCREENING BILATERAL MAMMOGRAM WITH 3D TOMO WITH CAD COMPARISON:  Previous exam(s). ACR Breast Density Category b: There are scattered areas of fibroglandular density. FINDINGS: There are no findings suspicious for malignancy. Images were processed with CAD. IMPRESSION: No mammographic evidence of malignancy. A result letter of this screening mammogram will be mailed directly to the patient. RECOMMENDATION: Screening mammogram in one year. (Code:SM-B-01Y) BI-RADS CATEGORY  1: Negative. Electronically Signed   By: Ammie Ferrier M.D.   On: 03/08/2015 15:15    Assessment & Plan:   Problem List Items Addressed This Visit    Anxiety    Spent 15 of 25 minute visit discussing her worsening anxiety , which seems to be ampliifying her somatic symptoms       Chronic suprapubic pain    She has no signs of UTI or nephrolithiasis. She underwent pelvic ultrasound and imaging recently prior to endoscopy.  Does not want to see Urology for consideration of interstitial cystitis but this is unlikely given normal UA.  She has multipel drug intolerances so changing hctz is poblematic .  Trial of medication for overactive bladder and more aggressive treatment of constipation. 2 way abdominal films to evaluate stool pattern. .        Other Visit Diagnoses    Dysuria    -  Primary    Lower abdominal pain        Relevant Orders    DG Abd 2 Views       I am having Ms. Poteete start on  solifenacin. I am also having her maintain her multivitamin with minerals, Vitamin D-3, MAGNESIUM PO, Biotin, Fish Oil, nebivolol, Azelastine-Fluticasone, escitalopram, diazepam, hydrochlorothiazide, and pantoprazole.  Meds ordered this encounter  Medications  . solifenacin (VESICARE) 5 MG tablet    Sig: Take 1 tablet (5 mg total) by mouth daily.    Dispense:  30 tablet    Refill:  1    There are no discontinued medications.  Follow-up: No Follow-up on file.   [name], MD

## 2015-05-30 DIAGNOSIS — R102 Pelvic and perineal pain: Secondary | ICD-10-CM

## 2015-05-30 DIAGNOSIS — G8929 Other chronic pain: Secondary | ICD-10-CM | POA: Insufficient documentation

## 2015-05-30 NOTE — Assessment & Plan Note (Addendum)
She has no signs of UTI or nephrolithiasis. She underwent pelvic ultrasound and imaging recently prior to endoscopy.  Does not want to see Urology for consideration of interstitial cystitis but this is unlikely given normal UA.  She has multipel drug intolerances so changing hctz is poblematic .  Trial of medication for overactive bladder and more aggressive treatment of constipation. 2 way abdominal films to evaluate stool pattern. Marland Kitchen

## 2015-05-30 NOTE — Assessment & Plan Note (Signed)
Spent  15 of 25 minute visit discussing her worsening anxiety , which seems to be ampliifying her somatic symptoms  

## 2015-06-23 ENCOUNTER — Telehealth: Payer: Self-pay

## 2015-06-23 NOTE — Telephone Encounter (Signed)
Pt did schedule for this Friday, 06/24/2014 11:40

## 2015-06-23 NOTE — Telephone Encounter (Signed)
Pt states she forgot to take her medication yesterday until 2 pm. States last night she started having palpitations. States she was feeling "weird". States driving to work this morning she still felt "flutters", states she is just not feeling good. States her BP is 154/98. Please call.

## 2015-06-25 ENCOUNTER — Encounter: Payer: Self-pay | Admitting: Cardiovascular Disease

## 2015-06-25 ENCOUNTER — Ambulatory Visit (INDEPENDENT_AMBULATORY_CARE_PROVIDER_SITE_OTHER): Payer: Commercial Managed Care - PPO | Admitting: Cardiovascular Disease

## 2015-06-25 ENCOUNTER — Encounter: Payer: Self-pay | Admitting: Internal Medicine

## 2015-06-25 VITALS — BP 140/82 | HR 63 | Ht 64.0 in | Wt 180.0 lb

## 2015-06-25 DIAGNOSIS — G4700 Insomnia, unspecified: Secondary | ICD-10-CM

## 2015-06-25 DIAGNOSIS — R002 Palpitations: Secondary | ICD-10-CM

## 2015-06-25 DIAGNOSIS — I10 Essential (primary) hypertension: Secondary | ICD-10-CM | POA: Diagnosis not present

## 2015-06-25 DIAGNOSIS — F419 Anxiety disorder, unspecified: Secondary | ICD-10-CM | POA: Diagnosis not present

## 2015-06-25 MED ORDER — PROPRANOLOL HCL 20 MG PO TABS: 20.0000 mg | ORAL_TABLET | Freq: Three times a day (TID) | ORAL | Status: DC | PRN

## 2015-06-25 NOTE — Assessment & Plan Note (Signed)
She reports having trouble with sleep  recommended she continue her exercise program Anxiety could contribute to periodic fluctuations in her blood pressure

## 2015-06-25 NOTE — Assessment & Plan Note (Signed)
For the most part blood pressure has been well-controlled Rare spike in her blood pressure, likely anxiety related Recommended she take propranolol as needed for breakthrough palpitations and tachycardia/high blood pressure If blood pressure stays elevated, could increase bystolic to 10 mg twice a day or 15 mg daily

## 2015-06-25 NOTE — Assessment & Plan Note (Signed)
For the most part has been doing well, will stay on beta blocker Propranolol for breakthrough

## 2015-06-25 NOTE — Patient Instructions (Addendum)
You are doing well.  Please take propranolol as needed 1/2 up to a full a pill For high blood pressure and palpitations  Ok to take extra 1/2 or whole bystolic  Please call us if you have new issues that need to be addressed before your next appt.

## 2015-06-25 NOTE — Assessment & Plan Note (Signed)
Recommended that she try Tylenol with Benadryl or melatonin Increase her exercise

## 2015-06-25 NOTE — Progress Notes (Signed)
Patient ID: Nicole Gould, female    DOB: 1960-08-21, 55 y.o.   MRN: [id number]  HPI Comments: [name] is a very pleasant 55 year old woman with history of palpitations, hypertension who presents for follow-up of her Blood pressure. Previous history of occasional chest tightness  In follow-up today, she reports that she is doing very well. She reports that her weight has been trending upwards Recent episode where she developed some palpitations and tightness on a Tuesday evening. Wednesday morning for gout her beta blocker pill, developed palpitations, headache, pressure 158/98 at work [employer] her medication, symptoms improved Typically blood pressure [id number] systolic  She has started an exercise program for weight loss She attributes her blood pressure to recent weight gain Continued stress at work She feels that she isn't full-blown menopause Periods of insomnia  EKG on today's visit shows normal sinus rhythm with rate 63 bpm, no significant ST or T-wave changes  Other past medical history Previously started on losartan and she was on this for 1 week but reported having significant side effects of general malaise This was held and she was started on amlodipine which she only took for 1 day as this caused a headache  back on HCTZ one half pill and atenolol 25 mg daily. She's had been on this combination for a long time Blood pressure rangig from 120 up to 160n Previous problems with sleeping secondary to anxiety, stress   lab work showing total cholesterol 183, LDL 106. This was reviewed with her   Myoview in 2005 for ectopy that was reportedly normal. She attributes her symptoms to menopause. Menopause symptoms have been getting worse over the past several years. Now with sweating, occasional palpitations, malaise.   history of concussion in April 2014 and was evaluated by Dr. Manuella Ghazi at [place].  The injury occurred when she fell out of a chair and a metal bar on chair  hit her in the back of head    Seeing a headache specialist in at [place]  [place] of CAD in father .    Allergies  Allergen Reactions  . Anesthetics, Amide Nausea And Vomiting  . Losartan Potassium-Hctz     Dizziness,  Shortness of breath     Outpatient Encounter Prescriptions as of 06/25/2015  Medication Sig  . Cholecalciferol (VITAMIN D-3) 1000 UNITS CAPS Take 1 capsule by mouth daily.  . hydrochlorothiazide (HYDRODIURIL) 25 MG tablet Take 0.5 tablets (12.5 mg total) by mouth daily.  Marland Kitchen MAGNESIUM PO Take 500 mg by mouth daily.   . Multiple Vitamins-Minerals (MULTIVITAMIN WITH MINERALS) tablet Take 1 tablet by mouth daily.  . nebivolol (BYSTOLIC) 10 MG tablet Take 1 tablet (10 mg total) by mouth daily.  . Omega-3 Fatty Acids (FISH OIL) 1200 MG CAPS Take 1,200 mg by mouth daily.   . pantoprazole (PROTONIX) 40 MG tablet TAKE 1 TABLET 30 MINUTES BEFORE BREAKFAST  . propranolol (INDERAL) 20 MG tablet Take 1 tablet (20 mg total) by mouth 3 (three) times daily as needed.  . [DISCONTINUED] Azelastine-Fluticasone (DYMISTA) 137-50 MCG/ACT SUSP Place 2 Squirts into the nose daily. In each nostril (Patient not taking: Reported on 06/25/2015)  . [DISCONTINUED] Biotin (BIOTIN MAXIMUM STRENGTH) 10 MG TABS Take 1 tablet by mouth daily. Reported on 06/25/2015  . [DISCONTINUED] diazepam (VALIUM) 5 MG tablet Take 1 tablet (5 mg total) by mouth every 12 (twelve) hours as needed for anxiety. (Patient not taking: Reported on 06/25/2015)  . [DISCONTINUED] escitalopram (LEXAPRO) 5 MG tablet Take 1 tablet (5 mg  total) by mouth daily. (Patient not taking: Reported on 06/25/2015)  . [DISCONTINUED] solifenacin (VESICARE) 5 MG tablet Take 1 tablet (5 mg total) by mouth daily. (Patient not taking: Reported on 06/25/2015)   No facility-administered encounter medications on file as of 06/25/2015.    Past Medical History  Diagnosis Date  . Chicken pox   . Frequent headaches   . GERD (gastroesophageal reflux disease)   .  Hypertension   . Seizure disorder in pregnancy ([place])   . Colon polyps   . Migraines   . Concussion     Past Surgical History  Procedure Laterality Date  . Tonsillectomy    . Tubal ligation      Social History  reports that she has never smoked. She has never used smokeless tobacco. She reports that she drinks alcohol. She reports that she does not use illicit drugs.  Family History family history includes Cancer in her maternal grandfather; Diabetes in her father and mother; Heart disease in her father; Hypertension in her father and mother; Kidney disease in her father.  Review of Systems  Constitutional: Negative.   Respiratory: Positive for chest tightness.   Cardiovascular: Positive for palpitations.  Endocrine: Negative.   Musculoskeletal: Negative.   Skin: Negative.   Neurological: Negative.   Hematological: Negative.   Psychiatric/Behavioral: Positive for sleep disturbance. The patient is nervous/anxious.   All other systems reviewed and are negative.   BP 140/82 mmHg  Pulse 63  Ht 5\' 4"  (1.626 m)  Wt 180 lb (81.647 kg)  BMI 30.88 kg/m2  Physical Exam  Constitutional: She is oriented to person, place, and time. She appears well-developed and well-nourished.  HENT:  Head: Normocephalic.  Nose: Nose normal.  Mouth/Throat: Oropharynx is clear and moist.  Eyes: Conjunctivae are normal. Pupils are equal, round, and reactive to light.  Neck: Normal range of motion. Neck supple. No JVD present.  Cardiovascular: Normal rate, regular rhythm, S1 normal, S2 normal, normal heart sounds and intact distal pulses.  Exam reveals no gallop and no friction rub.   No murmur heard. Pulmonary/Chest: Effort normal and breath sounds normal. No respiratory distress. She has no wheezes. She has no rales. She exhibits no tenderness.  Abdominal: Soft. Bowel sounds are normal. She exhibits no distension. There is no tenderness.  Musculoskeletal: Normal range of motion. She exhibits no  edema or tenderness.  Lymphadenopathy:    She has no cervical adenopathy.  Neurological: She is alert and oriented to person, place, and time. Coordination normal.  Skin: Skin is warm and dry. No rash noted. No erythema.  Psychiatric: She has a normal mood and affect. Her behavior is normal. Judgment and thought content normal.    Assessment and Plan  Nursing note and vitals reviewed.

## 2015-07-02 ENCOUNTER — Encounter: Payer: Self-pay | Admitting: Internal Medicine

## 2015-08-16 ENCOUNTER — Other Ambulatory Visit: Payer: Self-pay | Admitting: Internal Medicine

## 2015-08-18 ENCOUNTER — Telehealth: Payer: Self-pay | Admitting: Internal Medicine

## 2015-08-18 NOTE — Telephone Encounter (Signed)
Pt would like to get lab work before her appt. Can I sch with the orders that are in pt chart? Call pt @ [phone]. Thank you!

## 2015-08-19 ENCOUNTER — Telehealth: Payer: Self-pay | Admitting: *Deleted

## 2015-08-19 DIAGNOSIS — R631 Polydipsia: Secondary | ICD-10-CM

## 2015-08-19 NOTE — Telephone Encounter (Signed)
added

## 2015-08-19 NOTE — Telephone Encounter (Signed)
Pt needs labs and urine specimen

## 2015-08-19 NOTE — Telephone Encounter (Signed)
Notified pt. 

## 2015-08-19 NOTE — Telephone Encounter (Signed)
Patient would like to have a A1c added to her labs to be drawn. She stated that she has been thirsty lately, and would like to have her blood sugar tested.

## 2015-08-19 NOTE — Telephone Encounter (Signed)
Pt would like to have her A1C added to her labs , pt is feeling thirsty more than usual, she states that she is drinking 10- 12 bottles 16oz  of water. Please advise

## 2015-08-27 ENCOUNTER — Other Ambulatory Visit (INDEPENDENT_AMBULATORY_CARE_PROVIDER_SITE_OTHER): Payer: No Typology Code available for payment source

## 2015-08-27 ENCOUNTER — Telehealth: Payer: Self-pay | Admitting: *Deleted

## 2015-08-27 DIAGNOSIS — R5383 Other fatigue: Secondary | ICD-10-CM

## 2015-08-27 DIAGNOSIS — R631 Polydipsia: Secondary | ICD-10-CM

## 2015-08-27 DIAGNOSIS — I10 Essential (primary) hypertension: Secondary | ICD-10-CM

## 2015-08-27 DIAGNOSIS — Z113 Encounter for screening for infections with a predominantly sexual mode of transmission: Secondary | ICD-10-CM

## 2015-08-27 DIAGNOSIS — N951 Menopausal and female climacteric states: Secondary | ICD-10-CM

## 2015-08-27 LAB — COMPREHENSIVE METABOLIC PANEL
ALT: 18 U/L (ref 0–35)
AST: 21 U/L (ref 0–37)
Albumin: 4.1 g/dL (ref 3.5–5.2)
Alkaline Phosphatase: 43 U/L (ref 39–117)
BILIRUBIN TOTAL: 0.9 mg/dL (ref 0.2–1.2)
BUN: 18 mg/dL (ref 6–23)
CHLORIDE: 107 meq/L (ref 96–112)
CO2: 29 meq/L (ref 19–32)
Calcium: 9.5 mg/dL (ref 8.4–10.5)
Creatinine, Ser: 0.73 mg/dL (ref 0.40–1.20)
GFR: 105.64 mL/min (ref >60.00)
GLUCOSE: 95 mg/dL (ref 70–99)
POTASSIUM: 3.8 meq/L (ref 3.5–5.1)
Sodium: 142 mEq/L (ref 135–145)
Total Protein: 7.4 g/dL (ref 6.0–8.3)

## 2015-08-27 LAB — LIPID PANEL
CHOL/HDL RATIO: 3
CHOLESTEROL: 175 mg/dL (ref 0–200)
HDL: 55.9 mg/dL (ref >39.00)
LDL CALC: 108 mg/dL — AB (ref 0–99)
NonHDL: 119.35
TRIGLYCERIDES: 55 mg/dL (ref 0.0–149.0)
VLDL: 11 mg/dL (ref 0.0–40.0)

## 2015-08-27 LAB — TSH: TSH: 0.92 u[IU]/mL (ref 0.35–4.50)

## 2015-08-27 LAB — HEMOGLOBIN A1C: Hgb A1c MFr Bld: 5.5 % (ref 4.6–6.5)

## 2015-08-27 NOTE — Telephone Encounter (Signed)
Ai assume she means FSH/LH?

## 2015-08-27 NOTE — Telephone Encounter (Signed)
She didn't know the name of the test

## 2015-08-27 NOTE — Telephone Encounter (Signed)
Pt would like to add hormone test??

## 2015-08-28 LAB — HIV ANTIBODY (ROUTINE TESTING W REFLEX): HIV: NONREACTIVE

## 2015-08-28 LAB — HEPATITIS C ANTIBODY: HCV AB: REACTIVE — AB

## 2015-08-31 ENCOUNTER — Encounter: Payer: Self-pay | Admitting: *Deleted

## 2015-08-31 LAB — HEPATITIS C RNA QUANTITATIVE

## 2015-09-03 ENCOUNTER — Other Ambulatory Visit (HOSPITAL_COMMUNITY)
Admission: RE | Admit: 2015-09-03 | Discharge: 2015-09-03 | Disposition: A | Discharge status: Home / Self Care | Payer: No Typology Code available for payment source | Source: Ambulatory Visit | Attending: Internal Medicine | Admitting: Internal Medicine

## 2015-09-03 ENCOUNTER — Encounter: Payer: Self-pay | Admitting: Internal Medicine

## 2015-09-03 ENCOUNTER — Ambulatory Visit (INDEPENDENT_AMBULATORY_CARE_PROVIDER_SITE_OTHER): Payer: No Typology Code available for payment source | Admitting: Internal Medicine

## 2015-09-03 VITALS — BP 158/84 | HR 79 | Temp 98.0°F | Resp 14 | Ht 64.0 in | Wt 192.2 lb

## 2015-09-03 DIAGNOSIS — Z1151 Encounter for screening for human papillomavirus (HPV): Secondary | ICD-10-CM | POA: Diagnosis not present

## 2015-09-03 DIAGNOSIS — N912 Amenorrhea, unspecified: Secondary | ICD-10-CM | POA: Diagnosis not present

## 2015-09-03 DIAGNOSIS — Z01419 Encounter for gynecological examination (general) (routine) without abnormal findings: Secondary | ICD-10-CM | POA: Insufficient documentation

## 2015-09-03 DIAGNOSIS — R894 Abnormal immunological findings in specimens from other organs, systems and tissues: Secondary | ICD-10-CM

## 2015-09-03 DIAGNOSIS — Z124 Encounter for screening for malignant neoplasm of cervix: Secondary | ICD-10-CM

## 2015-09-03 DIAGNOSIS — Z0000 Encounter for general adult medical examination without abnormal findings: Secondary | ICD-10-CM | POA: Diagnosis not present

## 2015-09-03 DIAGNOSIS — R768 Other specified abnormal immunological findings in serum: Secondary | ICD-10-CM

## 2015-09-03 LAB — LUTEINIZING HORMONE: LH: 32.79 m[IU]/mL

## 2015-09-03 LAB — HEPATIC FUNCTION PANEL
ALT: 16 U/L (ref 0–35)
AST: 21 U/L (ref 0–37)
Albumin: 4.3 g/dL (ref 3.5–5.2)
Alkaline Phosphatase: 48 U/L (ref 39–117)
BILIRUBIN TOTAL: 1 mg/dL (ref 0.2–1.2)
Bilirubin, Direct: 0.2 mg/dL (ref 0.0–0.3)
Total Protein: 7.5 g/dL (ref 6.0–8.3)

## 2015-09-03 LAB — FOLLICLE STIMULATING HORMONE: FSH: 76 m[IU]/mL

## 2015-09-03 MED ORDER — BENZONATATE 200 MG PO CAPS: 200.0000 mg | ORAL_CAPSULE | Freq: Three times a day (TID) | ORAL | Status: DC | PRN

## 2015-09-03 NOTE — Progress Notes (Signed)
Pre-visit discussion using our clinic review tool. No additional management support is needed unless otherwise documented below in the visit note.  

## 2015-09-03 NOTE — Patient Instructions (Signed)

## 2015-09-05 DIAGNOSIS — R768 Other specified abnormal immunological findings in serum: Secondary | ICD-10-CM | POA: Insufficient documentation

## 2015-09-05 NOTE — Assessment & Plan Note (Addendum)
Spent 15 additional minutes in face to face time with patient  discussion of potential source and ramifications.  Hepatitis C viral load ordered today
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
## 2015-09-05 NOTE — Assessment & Plan Note (Signed)
Annual comprehensive preventive exam was done as well as an evaluation and management of chronic conditions .  During the course of the visit the patient was educated and counseled about appropriate screening and preventive services including :  diabetes screening, lipid analysis with projected  10 year  risk for CAD , nutrition counseling, breast, cervical and colorectal cancer screening, and recommended immunizations.  Printed recommendations for health maintenance screenings was give 

## 2015-09-06 ENCOUNTER — Encounter: Payer: Self-pay | Admitting: Internal Medicine

## 2015-09-06 ENCOUNTER — Telehealth: Payer: Self-pay | Admitting: Internal Medicine

## 2015-09-06 ENCOUNTER — Ambulatory Visit (INDEPENDENT_AMBULATORY_CARE_PROVIDER_SITE_OTHER): Payer: No Typology Code available for payment source | Admitting: Internal Medicine

## 2015-09-06 ENCOUNTER — Ambulatory Visit: Payer: Self-pay | Admitting: Family Medicine

## 2015-09-06 VITALS — BP 124/78 | HR 70 | Temp 97.9°F | Resp 12 | Ht 64.0 in | Wt 192.2 lb

## 2015-09-06 DIAGNOSIS — J069 Acute upper respiratory infection, unspecified: Secondary | ICD-10-CM

## 2015-09-06 DIAGNOSIS — R894 Abnormal immunological findings in specimens from other organs, systems and tissues: Secondary | ICD-10-CM

## 2015-09-06 DIAGNOSIS — R05 Cough: Secondary | ICD-10-CM

## 2015-09-06 DIAGNOSIS — R059 Cough, unspecified: Secondary | ICD-10-CM

## 2015-09-06 DIAGNOSIS — H65192 Other acute nonsuppurative otitis media, left ear: Secondary | ICD-10-CM

## 2015-09-06 DIAGNOSIS — R768 Other specified abnormal immunological findings in serum: Secondary | ICD-10-CM

## 2015-09-06 DIAGNOSIS — B9789 Other viral agents as the cause of diseases classified elsewhere: Secondary | ICD-10-CM

## 2015-09-06 LAB — CYTOLOGY - PAP

## 2015-09-06 MED ORDER — PREDNISONE 10 MG PO TABS: ORAL_TABLET | ORAL | Status: DC

## 2015-09-06 MED ORDER — LEVOFLOXACIN 500 MG PO TABS: 500.0000 mg | ORAL_TABLET | Freq: Every day | ORAL | Status: DC

## 2015-09-06 NOTE — Patient Instructions (Addendum)
Your flu test was negative   I am treating you for sinusitis/otitis (left sided)  which is a complication from your viral infection due to  persistent sinus congestion.   I am prescribing an antibiotic (levaquin) and a prednisone taper  To manage the infection and the inflammation in your ear/sinuses.   I also advise use of the following OTC meds to help with your other symptoms.   Take generic OTC benadryl 25 mg every 8 hours for the drainage, Afrin nasal spray 2 times daily for 5 days maxiumum  For the congestion.  flush your sinuses twice daily with Milta Deiters Med's sinus rise  (do over the sink because if you do it right you will spit out globs of mucus)  Use benzonatate capsules   FOR THE COUGH.  Gargle with salt water as needed for sore throat.    Please take a probiotic ( Align, Floraque or Culturelle), the generic version of one of these over the counter medications, or an alternative form (kombucha,  Yogurt, or another dietary source) for a minimum of 3 weeks to prevent a serious antibiotic associated diarrhea  Called clostridium dificile colitis.  Taking a probiotic may also prevent vaginitis due to yeast infections and can be continued indefinitely if you feel that it improves your digestion or your elimination (bowels).

## 2015-09-06 NOTE — Progress Notes (Signed)
Subjective:  Patient ID: [name], female    DOB: [birth year]  Age: 55 y.o. MRN: [id number]  CC: The primary encounter diagnosis was Cough. Diagnoses of Viral URI with cough, Acute nonsuppurative otitis media of left ear, and Positive hepatitis C antibody test were also pertinent to this visit.  HPI Nicole Gould presents for FLU LIKE SYMPTOMS .  Has been in bed for 3 days.  Chills started on Friday ,  Left sided headache, severe at times,  Body aches, subjective fevers and chills ,  Worsening dry cough  .  Loose stools now better,  Low back pain .  Had flu vaccine.  Sick contacts at works.    Outpatient Prescriptions Prior to Visit  Medication Sig Dispense Refill  . benzonatate (TESSALON) 200 MG capsule Take 1 capsule (200 mg total) by mouth 3 (three) times daily as needed for cough. 60 capsule 0  . Cholecalciferol (VITAMIN D-3) 1000 UNITS CAPS Take 1 capsule by mouth daily.    . hydrochlorothiazide (HYDRODIURIL) 25 MG tablet Take 0.5 tablets (12.5 mg total) by mouth daily. 90 tablet 3  . MAGNESIUM PO Take 500 mg by mouth daily. Reported on 09/03/2015    . Multiple Vitamins-Minerals (MULTIVITAMIN WITH MINERALS) tablet Take 1 tablet by mouth daily.    . nebivolol (BYSTOLIC) 10 MG tablet Take 1 tablet (10 mg total) by mouth daily. 90 tablet 4  . Omega-3 Fatty Acids (FISH OIL) 1200 MG CAPS Take 1,200 mg by mouth daily.     . pantoprazole (PROTONIX) 40 MG tablet TAKE 1 TABLET 30 MINUTES BEFORE BREAKFAST 90 tablet 1  . propranolol (INDERAL) 20 MG tablet Take 1 tablet (20 mg total) by mouth 3 (three) times daily as needed. 90 tablet 3   No facility-administered medications prior to visit.    Review of Systems;  Patient denies headache, fevers, malaise, unintentional weight loss, skin rash, eye pain, sinus congestion and sinus pain, sore throat, dysphagia,  hemoptysis , cough, dyspnea, wheezing, chest pain, palpitations, orthopnea, edema, abdominal pain, nausea, melena, diarrhea,  constipation, flank pain, dysuria, hematuria, urinary  Frequency, nocturia, numbness, tingling, seizures,  Focal weakness, Loss of consciousness,  Tremor, insomnia, depression, anxiety, and suicidal ideation.      Objective:  BP 124/78 mmHg  Pulse 70  Temp(Src) 97.9 F (36.6 C) (Oral)  Resp 12  Ht 5\' 4"  (1.626 m)  Wt 192 lb 4 oz (87.204 kg)  BMI 32.98 kg/m2  SpO2 98%  BP Readings from Last 3 Encounters:  09/06/15 124/78  09/03/15 158/84  06/25/15 140/82    Wt Readings from Last 3 Encounters:  09/06/15 192 lb 4 oz (87.204 kg)  09/03/15 192 lb 4 oz (87.204 kg)  06/25/15 180 lb (81.647 kg)    General appearance: alert, cooperative and appears stated age Ears: left TM cloudy ,  With erythematous chages to stapes. Normal Right TM and external ear canals both ears Throat: lips, mucosa, and tongue normal; teeth and gums normal Neck: no adenopathy, no carotid bruit, supple, symmetrical, trachea midline and thyroid not enlarged, symmetric, no tenderness/mass/nodules Back: symmetric, no curvature. ROM normal. No CVA tenderness. Lungs: clear to auscultation bilaterally Heart: regular rate and rhythm, S1, S2 normal, no murmur, click, rub or gallop Abdomen: soft, non-tender; bowel sounds normal; no masses,  no organomegaly Pulses: 2+ and symmetric Skin: Skin color, texture, turgor normal. No rashes or lesions Lymph nodes: Cervical, supraclavicular, and axillary nodes normal.  Lab Results  Component Value Date   HGBA1C 5.5 08/27/2015  Lab Results  Component Value Date   CREATININE 0.73 08/27/2015   CREATININE 0.80 09/16/2014   CREATININE 0.72 09/08/2014    Lab Results  Component Value Date   WBC 4.6 09/16/2014   HGB 13.7 09/16/2014   HCT 41.3 09/16/2014   PLT 162.0 09/16/2014   GLUCOSE 95 08/27/2015   CHOL 175 08/27/2015   TRIG 55.0 08/27/2015   HDL 55.90 08/27/2015   LDLDIRECT 113.0 09/16/2014   LDLCALC 108* 08/27/2015   ALT 16 09/03/2015   AST 21 09/03/2015   NA  142 08/27/2015   K 3.8 08/27/2015   CL 107 08/27/2015   CREATININE 0.73 08/27/2015   BUN 18 08/27/2015   CO2 29 08/27/2015   TSH 0.92 08/27/2015   HGBA1C 5.5 08/27/2015    Mm Screening Breast Tomo Bilateral  03/08/2015  CLINICAL DATA:  Screening. EXAM: DIGITAL SCREENING BILATERAL MAMMOGRAM WITH 3D TOMO WITH CAD COMPARISON:  Previous exam(s). ACR Breast Density Category b: There are scattered areas of fibroglandular density. FINDINGS: There are no findings suspicious for malignancy. Images were processed with CAD. IMPRESSION: No mammographic evidence of malignancy. A result letter of this screening mammogram will be mailed directly to the patient. RECOMMENDATION: Screening mammogram in one year. (Code:SM-B-01Y) BI-RADS CATEGORY  1: Negative. Electronically Signed   By: Ammie Ferrier M.D.   On: 03/08/2015 15:15    Assessment & Plan:   Problem List Items Addressed This Visit    Positive hepatitis C antibody test    Viral load still pending ,  Drawn on March 17       Viral URI with cough   Non-suppurative otitis media    Mild erythematous changes and cloudiness of left ear in the setting of persistent sinus congestion , Levaquin and prednisone,   probiotic advised       Relevant Medications   levofloxacin (LEVAQUIN) 500 MG tablet    Other Visit Diagnoses    Cough    -  Primary    Relevant Orders    POCT Influenza A/B       I am having [name] start on predniSONE and levofloxacin. I am also having her maintain her multivitamin with minerals, Vitamin D-3, MAGNESIUM PO, Fish Oil, nebivolol, hydrochlorothiazide, propranolol, pantoprazole, and benzonatate.  Meds ordered this encounter  Medications  . predniSONE (DELTASONE) 10 MG tablet    Sig: 6 tablets on Day 1 , then reduce by 1 tablet daily until gone    Dispense:  21 tablet    Refill:  0  . levofloxacin (LEVAQUIN) 500 MG tablet    Sig: Take 1 tablet (500 mg total) by mouth daily.    Dispense:  7 tablet    Refill:  0     There are no discontinued medications.  Follow-up: No Follow-up on file.   [name], MD

## 2015-09-07 ENCOUNTER — Telehealth: Payer: Self-pay | Admitting: Internal Medicine

## 2015-09-07 DIAGNOSIS — J069 Acute upper respiratory infection, unspecified: Secondary | ICD-10-CM | POA: Insufficient documentation

## 2015-09-07 DIAGNOSIS — B9789 Other viral agents as the cause of diseases classified elsewhere: Secondary | ICD-10-CM

## 2015-09-07 DIAGNOSIS — H6590 Unspecified nonsuppurative otitis media, unspecified ear: Secondary | ICD-10-CM | POA: Insufficient documentation

## 2015-09-07 NOTE — Assessment & Plan Note (Signed)
Viral load still pending ,  Drawn on March 17

## 2015-09-07 NOTE — Progress Notes (Signed)
[place] [employer] spoke with pt.

## 2015-09-07 NOTE — Telephone Encounter (Signed)
Pt called returning your call. Pt states today is a very good day to reach her. Call pt @ [phone]. Thank you!

## 2015-09-07 NOTE — Assessment & Plan Note (Signed)
Mild erythematous changes and cloudiness of left ear in the setting of persistent sinus congestion , Levaquin and prednisone,   probiotic advised

## 2015-09-08 NOTE — Telephone Encounter (Signed)
This encounter was created in error - please disregard.

## 2015-09-09 LAB — HEPATITIS C RNA QUANTITATIVE: HCV Quantitative: NOT DETECTED IU/mL (ref <15)

## 2015-09-13 ENCOUNTER — Encounter: Payer: Self-pay | Admitting: *Deleted

## 2015-09-13 NOTE — Telephone Encounter (Signed)
Notified pt of test results pt verbalized understanding and appreciation. Pt scheduled an appointment with Dr. [name] to go opver test results on 10/29/15

## 2015-09-14 ENCOUNTER — Other Ambulatory Visit: Payer: Self-pay | Admitting: *Deleted

## 2015-09-14 MED ORDER — PROPRANOLOL HCL 20 MG PO TABS: 20.0000 mg | ORAL_TABLET | Freq: Three times a day (TID) | ORAL | Status: DC | PRN

## 2015-09-27 ENCOUNTER — Telehealth: Payer: Self-pay | Admitting: Cardiovascular Disease

## 2015-09-27 NOTE — Telephone Encounter (Signed)
Left message on pt's vm that I cannot find an interaction for Bystolic w/ grapefruit.  Asked her to call back w/ any questions or concerns.

## 2015-09-27 NOTE — Telephone Encounter (Signed)
Patient takes Bystolic and wants to know if she can eat/drink grapefruit.  Patient wants to eat 1/2 grapefruit 3 x daily  And would like to make sure there is no interaction .  Please leave detailed vm if patient does not answer.

## 2015-10-29 ENCOUNTER — Other Ambulatory Visit: Payer: Self-pay | Admitting: Cardiovascular Disease

## 2015-10-29 ENCOUNTER — Ambulatory Visit: Payer: Self-pay | Admitting: Internal Medicine

## 2015-11-23 ENCOUNTER — Telehealth: Payer: Self-pay | Admitting: Cardiovascular Disease

## 2015-11-23 MED ORDER — HYDROCHLOROTHIAZIDE 25 MG PO TABS: 12.5000 mg | ORAL_TABLET | Freq: Every day | ORAL | Status: DC

## 2015-11-23 MED ORDER — NEBIVOLOL HCL 10 MG PO TABS: ORAL_TABLET | ORAL | Status: DC

## 2015-11-23 NOTE — Telephone Encounter (Signed)
Please review message from patient.  The Bystolic and HCTZ was sent to [facility].

## 2015-11-23 NOTE — Telephone Encounter (Signed)
Left message for pt to call back  °

## 2015-11-23 NOTE — Telephone Encounter (Signed)
Pt calling trying to change pharmacy  Current [facility] is [facility] [address] in [place] is Paediatric nurse on [address] in [place]  She also is asking for nurse to call her back. She states "her heart is feeling weird". She is feeling well now but earlier she wasn't not sure if it was the heat. But all the sudden this is coming on. Doesn't want to ignore this. Would like to run this by [place] Please call back.   *STAT* If patient is at the pharmacy, call can be transferred to refill team.   1. Which medications need to be refilled? (please list name of each medication and dose if known) Bystolic and HydroCHLOROthiazide  2. Which pharmacy/location (including street and city if local pharmacy) is medication to be sent to? [facility] on [address] in [place]  3. Do they need a 30 day or 90 day supply? 90 day   She also is asking for nurse to call her back. She states "her heart is feeling weird". She is feeling well now but earlier she wasn't not sure if it was the heat. But all the sudden this is coming on. Doesn't want to ignore this. Would like to run this by [place] Please call back.

## 2015-11-25 ENCOUNTER — Other Ambulatory Visit: Payer: Self-pay | Admitting: Internal Medicine

## 2015-11-26 NOTE — Telephone Encounter (Signed)
Patient states that when she first started taking blood pressure medication she was feeling sluggish and felt as if her heart was heavy but that after a period of time she started feeling better and no longer had those symptoms. Recently she had a episode similar "out of the blue" to that and was concerned about how she was feeling. She said that it was a weird feeling with her heart and she did check her blood pressure and it was elevated during that episode and felt that was the issue. She stated that she also notes that if her weight is up her blood pressure seems to stay up as well. She has been on bystolic for a year with no problems after getting use to it. Instructed her to avoid caffeine and to start keeping a blood pressure log with readings so that Dr. Rockey Situ could see a trend. We also discussed previous cardiology work up and told her to obtain medical records and bring to her appointment for physician to review. She was previously seen Jan. 2017 and was scheduled for follow up in a year. Scheduled her an appointment for her to come in and see you 01/11/16 at 2:40pm. Instructed her to bring blood pressure log and previous cardiac reports for Dr. Rockey Situ to review. Instructed patient to please call us back if her symptoms should worsen and if she should develop chest pain to go to local emergency room. She verbalized agreement with plan of care, understanding of our conversation, and had no further questions at this time.

## 2015-12-01 ENCOUNTER — Telehealth: Payer: Self-pay | Admitting: Cardiovascular Disease

## 2015-12-01 NOTE — Telephone Encounter (Signed)
Patient calling the office for samples of medication:   1.  What medication and dosage are you requesting samples for?  Bystolic 10 mg po daily   2.  Are you currently out of this medication?   Yes  Please call if we have samples patient can pick up

## 2015-12-01 NOTE — Telephone Encounter (Signed)
Placed samples at front desk for pick up. 

## 2015-12-10 ENCOUNTER — Ambulatory Visit: Payer: Self-pay | Admitting: Internal Medicine

## 2015-12-14 ENCOUNTER — Telehealth: Payer: Self-pay | Admitting: *Deleted

## 2015-12-14 NOTE — Telephone Encounter (Signed)
Please advise 

## 2015-12-14 NOTE — Telephone Encounter (Signed)
The patient doe not need a fecal occult test if she is scheduled to have a colonoscopy

## 2015-12-14 NOTE — Telephone Encounter (Signed)
Patient has requested to have occult stool testing done. She stated that she receives a colonoscopy every 3 years,and would like to have this test done in between. She currently do not have blood in her stool. She stated that the PCP has to order this test for insurance to pay.

## 2015-12-14 NOTE — Telephone Encounter (Signed)
Patient due colonoscopy in December patient wanted nurse to state that insurance will pay for the occult stool test if she is not having symptoms are seeing blood in stool advised patient could order the occult stool test but I could not say insurance will pay. Patient advised she would contact insurance for payment verification.  FYI

## 2016-01-11 ENCOUNTER — Encounter: Payer: Self-pay | Admitting: Cardiovascular Disease

## 2016-01-11 ENCOUNTER — Telehealth: Payer: Self-pay | Admitting: Internal Medicine

## 2016-01-11 ENCOUNTER — Encounter (INDEPENDENT_AMBULATORY_CARE_PROVIDER_SITE_OTHER): Payer: Self-pay

## 2016-01-11 ENCOUNTER — Ambulatory Visit (INDEPENDENT_AMBULATORY_CARE_PROVIDER_SITE_OTHER): Payer: No Typology Code available for payment source | Admitting: Cardiovascular Disease

## 2016-01-11 VITALS — BP 130/80 | HR 60 | Ht 64.0 in | Wt 193.8 lb

## 2016-01-11 DIAGNOSIS — G4700 Insomnia, unspecified: Secondary | ICD-10-CM

## 2016-01-11 DIAGNOSIS — I10 Essential (primary) hypertension: Secondary | ICD-10-CM | POA: Diagnosis not present

## 2016-01-11 DIAGNOSIS — R002 Palpitations: Secondary | ICD-10-CM

## 2016-01-11 NOTE — Patient Instructions (Signed)
Medication Instructions:   No changes to your meds   Testing/Procedures:  Research CT coronary calcium score  Done in [place], $150   Follow-Up: It was a pleasure seeing you in the office today. Please call us if you have new issues that need to be addressed before your next appt.  [phone]  Your physician wants you to follow-up in: 12 months.  You will receive a reminder letter in the mail two months in advance. If you don't receive a letter, please call our office to schedule the follow-up appointment.  If you need a refill on your cardiac medications before your next appointment, please call your pharmacy.

## 2016-01-11 NOTE — Progress Notes (Signed)
Cardiology Office Note  Date:  01/11/2016   ID:  Nicole Gould, DOB March 31, 1961, MRN ID:[id number]  PCP:  [name], MD   Chief Complaint  Patient presents with  . Other    Pt. had a spell of chest pain. Meds reviewed by the patient verbally.     HPI:  [name] is a very pleasant 55 year old woman with history of palpitations, hypertension who presents for follow-up of her Blood pressure. Previous history of occasional chest tightness  In follow-up, she reports that she feels well She has had recent weight gain  Started a workout program with a trainer In general blood pressure well controlled, occasionally blood pressure elevated at work On recheck only seems to improve Currently taking HCTZ [id number] mg daily, bystolic 10 mg daily  Lab work reviewed with her showing total cholesterol 170s, hemoglobin A1c 5.5 She is a nonsmoker  Periods of insomnia. Tried over-the-counter supplements, made her have vivid dreams Feels insomnia secondary to menopause  EKG on today's visit shows normal sinus rhythm with rate 60 bpm, no significant ST or T-wave changes  Other past medical history Previously started on losartan and she was on this for 1 week but reported having significant side effects of general malaise This was held and she was started on amlodipine which she only took for 1 day as this caused a headache  back on HCTZ one half pill and atenolol 25 mg daily. She's had been on this combination for a long time Blood pressure rangig from 120 up to 160n Previous problems with sleeping secondary to anxiety, stress   lab work showing total cholesterol 183, LDL 106. This was reviewed with her   Myoview in 2005 for ectopy that was reportedly normal. She attributes her symptoms to menopause. Menopause symptoms have been getting worse over the past several years. Now with sweating, occasional palpitations, malaise.   history of concussion in April 2014 and was evaluated  by Dr. Manuella Ghazi at [place].  The injury occurred when she fell out of a chair and a metal bar on chair hit her in the back of head    Seeing a headache specialist in at [place]  PMH:   has a past medical history of Chicken pox; Colon polyps; Concussion; Frequent headaches; GERD (gastroesophageal reflux disease); Hypertension; Migraines; and Seizure disorder in pregnancy ([place]).  PSH:    Past Surgical History:  Procedure Laterality Date  . TONSILLECTOMY    . TUBAL LIGATION      Current Outpatient Prescriptions  Medication Sig Dispense Refill  . Cholecalciferol (VITAMIN D-3) 1000 UNITS CAPS Take 1 capsule by mouth daily.    . hydrochlorothiazide (HYDRODIURIL) 25 MG tablet Take 0.5 tablets (12.5 mg total) by mouth daily. 90 tablet 3  . Multiple Vitamins-Minerals (MULTIVITAMIN WITH MINERALS) tablet Take 1 tablet by mouth daily.    . nebivolol (BYSTOLIC) 10 MG tablet TAKE 1 TABLET (10 MG TOTAL) BY MOUTH DAILY. 90 tablet 3  . Omega-3 Fatty Acids (FISH OIL) 1200 MG CAPS Take 1,200 mg by mouth daily.     . pantoprazole (PROTONIX) 40 MG tablet TAKE 1 TABLET 30 MINUTES BEFORE BREAKFAST 90 tablet 1   No current facility-administered medications for this visit.      Allergies:   Anesthetics, amide and Losartan potassium-hctz   Social History:  The patient  reports that she has never smoked. She has never used smokeless tobacco. She reports that she drinks alcohol. She reports that she does not use drugs.  Family History:   family history includes Cancer in her maternal grandfather; Diabetes in her father and mother; Heart disease in her father; Hypertension in her father and mother; Kidney disease in her father.    Review of Systems: Review of Systems  Constitutional: Negative.   Respiratory: Negative.   Cardiovascular: Negative.   Gastrointestinal: Negative.   Musculoskeletal: Negative.   Neurological: Negative.   Psychiatric/Behavioral: Negative.   All other systems reviewed and are  negative.    PHYSICAL EXAM: VS:  BP 130/80 (BP Location: Left Arm, Patient Position: Sitting, Cuff Size: Normal)   Pulse 60   Ht 5\' 4"  (1.626 m)   Wt 193 lb 12 oz (87.9 kg)   BMI 33.26 kg/m  , BMI Body mass index is 33.26 kg/m. GEN: Well nourished, well developed, in no acute distress  HEENT: normal  Neck: no JVD, carotid bruits, or masses Cardiac: RRR; no murmurs, rubs, or gallops,no edema  Respiratory:  clear to auscultation bilaterally, normal work of breathing GI: soft, nontender, nondistended, + BS MS: no deformity or atrophy  Skin: warm and dry, no rash Neuro:  Strength and sensation are intact Psych: euthymic mood, full affect    Recent Labs: 08/27/2015: BUN 18; Creatinine, Ser 0.73; Potassium 3.8; Sodium 142; TSH 0.92 09/03/2015: ALT 16    Lipid Panel Lab Results  Component Value Date   CHOL 175 08/27/2015   HDL 55.90 08/27/2015   LDLCALC 108 (H) 08/27/2015   TRIG 55.0 08/27/2015      Wt Readings from Last 3 Encounters:  01/11/16 193 lb 12 oz (87.9 kg)  09/03/15 192 lb 4 oz (87.2 kg)  06/25/15 180 lb (81.6 kg)       ASSESSMENT AND PLAN:  Palpitations - Plan: EKG 12-Lead Symptoms relatively well controlled on low-dose beta blocker No changes to her medications  Essential hypertension, benign For the most part blood pressure is well controlled [id number] systolic typically, though under stress can go up to 150 Stress reduction techniques discussed She is working out aggressively  Insomnia Continues to have problems with insomnia She has tried over-the-counter vitamins but has vivid dreams She'll continue melatonin, Benadryl as needed     Total encounter time more than 15 minutes  Greater than 50% was spent in counseling and coordination of care with the patient   Disposition:   F/U  12 months   Orders Placed This Encounter  Procedures  . EKG 12-Lead     Signed, Esmond Plants, M.D., Ph.D. 01/11/2016  [place],  [place]

## 2016-01-11 NOTE — Telephone Encounter (Signed)
I left a message for the patient that we do not recommend colon cleanse.  I asked her to call back if she has any additional questions or concerns.

## 2016-01-13 ENCOUNTER — Other Ambulatory Visit: Payer: Self-pay | Admitting: Internal Medicine

## 2016-01-13 DIAGNOSIS — Z1231 Encounter for screening mammogram for malignant neoplasm of breast: Secondary | ICD-10-CM

## 2016-01-21 ENCOUNTER — Ambulatory Visit: Payer: Self-pay | Admitting: Cardiovascular Disease

## 2016-02-29 ENCOUNTER — Other Ambulatory Visit: Payer: Self-pay | Admitting: Internal Medicine

## 2016-03-06 ENCOUNTER — Ambulatory Visit
Admission: RE | Admit: 2016-03-06 | Discharge: 2016-03-06 | Disposition: A | Discharge status: Home / Self Care | Payer: No Typology Code available for payment source | Source: Ambulatory Visit | Attending: Internal Medicine | Admitting: Internal Medicine

## 2016-03-06 DIAGNOSIS — Z1231 Encounter for screening mammogram for malignant neoplasm of breast: Secondary | ICD-10-CM

## 2016-03-29 ENCOUNTER — Encounter: Payer: Self-pay | Admitting: Internal Medicine

## 2016-04-19 ENCOUNTER — Encounter: Payer: Self-pay | Admitting: Internal Medicine

## 2016-05-03 ENCOUNTER — Ambulatory Visit (AMBULATORY_SURGERY_CENTER): Payer: Self-pay | Admitting: *Deleted

## 2016-05-03 VITALS — Ht 64.0 in | Wt 188.0 lb

## 2016-05-03 DIAGNOSIS — Z86010 Personal history of colonic polyps: Secondary | ICD-10-CM

## 2016-05-03 MED ORDER — NA SULFATE-K SULFATE-MG SULF 17.5-3.13-1.6 GM/177ML PO SOLN: 1.0000 | Freq: Once | ORAL | 0 refills | Status: AC

## 2016-05-03 NOTE — Progress Notes (Signed)
No egg or soy allergy known to patient  No issues with past sedation with any surgeries  or procedures, no intubation problems - had propofol 2014 no issues  No diet pills per patient No home 02 use per patient  No blood thinners per patient  Pt denies issues with constipation  No A fib or A flutter

## 2016-05-16 ENCOUNTER — Encounter: Payer: Self-pay | Admitting: Internal Medicine

## 2016-05-22 ENCOUNTER — Telehealth: Payer: Self-pay | Admitting: Internal Medicine

## 2016-05-22 ENCOUNTER — Telehealth: Payer: Self-pay | Admitting: Cardiovascular Disease

## 2016-05-22 NOTE — Telephone Encounter (Signed)
Spoke w/ pt.  She reports that the last time she had a GYN procedure, her BP shot up to 198/100 during recovery. She had to have IV antihypertensives before d/c and then more meds after she got home. She is concerned that the same thing will happen after her colonoscopy on Friday, 12/8. She is very nervous thinking about what could happen and is sending her BP up thinking about it. She has propranolol 20 mg TID prn for emergencies. She started taking this today, as her BP is 177/72 this am. She is afraid that this will not be enough to prevent her from having another hypertensive episode after her colonoscopy and would like to know if Dr. Rockey Situ has any other recommendations.

## 2016-05-22 NOTE — Telephone Encounter (Signed)
Pt calling again asking if we can write her a letter so she doesn't have to go to work Please advise.

## 2016-05-22 NOTE — Telephone Encounter (Signed)
Pt requesting if we can call her back She is having a procedure on Friday and they will be putting her to sleep (colonoscopy)  In the past that anesthesia had her BP go way up  Would like to prevent that, her anxiety is making her BP go up now already as well.  Please call back

## 2016-05-23 NOTE — Telephone Encounter (Signed)
Pt states in the past she had a procedure at her GYN office and her BP was really high after the procedure. Pt states that her BP has been elevated since the weekend and she is very nervous about having colon this Friday, concerned it will shoot up again. Pt requests to cancel colon until she can see her cardiologist to make sure BP is ok. Colon cancelled and pt instructed to call the office back when she is ready to reschedule the colon, Dr. Hilarie Fredrickson notified.

## 2016-05-24 MED ORDER — ALPRAZOLAM 0.5 MG PO TBDP: 0.5000 mg | ORAL_TABLET | Freq: Two times a day (BID) | ORAL | 0 refills | Status: DC | PRN

## 2016-05-24 NOTE — Telephone Encounter (Signed)
Left voicemail message to call back  

## 2016-05-24 NOTE — Telephone Encounter (Signed)
Left voice mail to call back 

## 2016-05-24 NOTE — Telephone Encounter (Signed)
Sounds as if she is very anxious about this colonoscopy on Friday Wonder if she needs some Xanax Will cc Dr. [name]. For blood pressure could take extra by systolic, even extra HCTZ Suspect that some anti-anxiety pills such as Xanax may help the best and will help her avoid taking extra blood pressure pills  She will likely need a pill the morning of the procedure as well as This would help with preop blood pressure

## 2016-05-24 NOTE — Telephone Encounter (Signed)
Yes, I will prescribe the alprazolam . She can use it twice daily until the procedure is over

## 2016-05-25 ENCOUNTER — Encounter: Payer: Self-pay | Admitting: Family Medicine

## 2016-05-25 ENCOUNTER — Ambulatory Visit (INDEPENDENT_AMBULATORY_CARE_PROVIDER_SITE_OTHER): Payer: No Typology Code available for payment source | Admitting: Family Medicine

## 2016-05-25 ENCOUNTER — Other Ambulatory Visit: Payer: Self-pay | Admitting: Internal Medicine

## 2016-05-25 DIAGNOSIS — I10 Essential (primary) hypertension: Secondary | ICD-10-CM | POA: Diagnosis not present

## 2016-05-25 MED ORDER — ALPRAZOLAM 0.5 MG PO TABS: 0.5000 mg | ORAL_TABLET | Freq: Two times a day (BID) | ORAL | 0 refills | Status: DC | PRN

## 2016-05-25 NOTE — Telephone Encounter (Signed)
Spoke with patient and reviewed Dr. Donivan Scull recommendations. She verbalized understanding and states that she cancelled her colonoscopy. She states that with previous procedure she was on tenormin and HCTZ and then when she was on the bystolic and HCTZ she had some blood pressure issues. She stated that she wants to wait on having her procedure and follow up with her physicians to get her blood pressure under control better before moving forward with her testing. She is scheduled to see Dr. Rockey Situ 06/23/2016 at 10:40AM and is seeing Dr. Lacinda Axon today at her PCP office. She verbalized understanding of our conversation and had no further questions at this time.

## 2016-05-25 NOTE — Patient Instructions (Addendum)
Use 1/2 of the xanax. Start with it at night.  Discuss anesthesia with Dr. Hilarie Fredrickson.  Try not to stress.  Follow up closely with Dr. [name]  Take care  Dr. Lacinda Axon

## 2016-05-25 NOTE — Progress Notes (Unsigned)
alpra

## 2016-05-25 NOTE — Progress Notes (Signed)
Pre visit review using our clinic review tool, if applicable. No additional management support is needed unless otherwise documented below in the visit note. 

## 2016-05-25 NOTE — Progress Notes (Signed)
Subjective:  Patient ID: Nicole Gould, female    DOB: Oct 25, 1960  Age: 55 y.o. MRN: [id number]  CC: Elevated blood pressure  HPI:  55 year old female with hypertension presents with complaints of elevated BP.  Patient has not been feeling well recently. Her BP has been signficantly elevated. She endorses compliance with HCTZ and Bystolic. She has been taking additional propranolol (prescribed by Dr. Rockey Situ) given BP elevation. She is very anxious and concerned about this. She states that she has been stressed lately. She has an upcoming colonoscopy and had a prior bad experience with anesthesia during a GYN procedure (BP was severely elevated). She is very concerned that this will happen with her colonoscopy. Additionally, she states that one of her co-workers husband just passed away at a young age. She is very concerned that her BP is going to cause her severe harm. She has contacted our office and cardiology. Notes reviewed today. Patient has no issues or concerns today.   Social Hx   Social History   Social History  . Marital status: Married    Spouse name: N/A  . Number of children: N/A  . Years of education: N/A   Social History Main Topics  . Smoking status: Never Smoker  . Smokeless tobacco: Never Used  . Alcohol use Yes     Comment: very rare - glass red wine when that   . Drug use: No  . Sexual activity: Yes    Partners: Male   Other Topics Concern  . None   Social History Narrative  . None    Review of Systems  Cardiovascular:       Elevated BP.  Psychiatric/Behavioral:       Anxious.    Objective:  BP (!) 158/72 (BP Location: Left Arm, Patient Position: Sitting, Cuff Size: Normal)   Pulse 65   Temp 98.1 F (36.7 C) (Oral)   Resp 14   Wt 194 lb (88 kg)   SpO2 100%   BMI 33.30 kg/m   BP/Weight 05/25/2016 05/03/2016 [id number]  Systolic BP 0000000 - [id number]  Diastolic BP 72 - 80  Wt. (Lbs) 194 188 193.75  BMI 33.3 32.27 33.26    Physical Exam    Constitutional: She is oriented to person, place, and time. She appears well-developed. No distress.  HENT:  Head: Normocephalic and atraumatic.  Pulmonary/Chest: Effort normal.  Neurological: She is alert and oriented to person, place, and time.  Psychiatric:  Very anxious and perseverative.   Vitals reviewed.  Lab Results  Component Value Date   WBC 4.6 09/16/2014   HGB 13.7 09/16/2014   HCT 41.3 09/16/2014   PLT 162.0 09/16/2014   GLUCOSE 95 08/27/2015   CHOL 175 08/27/2015   TRIG 55.0 08/27/2015   HDL 55.90 08/27/2015   LDLDIRECT 113.0 09/16/2014   LDLCALC 108 (H) 08/27/2015   ALT 16 09/03/2015   AST 21 09/03/2015   NA 142 08/27/2015   K 3.8 08/27/2015   CL 107 08/27/2015   CREATININE 0.73 08/27/2015   BUN 18 08/27/2015   CO2 29 08/27/2015   TSH 0.92 08/27/2015   HGBA1C 5.5 08/27/2015    Assessment & Plan:   Problem List Items Addressed This Visit    Essential hypertension, benign    Established problem. BP elevated today.  She has had issues with this in the past and her PCP and Cardiologist feel that this is worsened by anxiety. I agree with this. Patient very anxious today about BP and  upcoming procedure. PCP has just prescribed Xanax. Advised use as prescribed. Patient concerned about side effects so I advised to take 1/2 tablet of 0.5 mg Xanax. Advised to avoid prior anesthesia drugs.         Follow-up: PRN  Benedict

## 2016-05-25 NOTE — Assessment & Plan Note (Signed)
Established problem. BP elevated today.  She has had issues with this in the past and her PCP and Cardiologist feel that this is worsened by anxiety. I agree with this. Patient very anxious today about BP and upcoming procedure. PCP has just prescribed Xanax. Advised use as prescribed. Patient concerned about side effects so I advised to take 1/2 tablet of 0.5 mg Xanax. Advised to avoid prior anesthesia drugs.

## 2016-05-26 ENCOUNTER — Encounter: Payer: No Typology Code available for payment source | Admitting: Internal Medicine

## 2016-05-26 ENCOUNTER — Encounter: Payer: Self-pay | Admitting: Internal Medicine

## 2016-05-26 ENCOUNTER — Telehealth: Payer: Self-pay | Admitting: Internal Medicine

## 2016-06-02 ENCOUNTER — Encounter: Payer: Self-pay | Admitting: Internal Medicine

## 2016-06-05 ENCOUNTER — Telehealth: Payer: Self-pay | Admitting: Cardiovascular Disease

## 2016-06-05 NOTE — Telephone Encounter (Signed)
Bystolic10 mg samples placed at front desk for pick up.

## 2016-06-05 NOTE — Telephone Encounter (Signed)
Patient calling the office for samples of medication:   1.  What medication and dosage are you requesting samples for? bystolic 10 mg po once daily   2.  Are you currently out of this medication? Almost

## 2016-06-19 HISTORY — PX: COLONOSCOPY: SHX174

## 2016-06-19 HISTORY — PX: POLYPECTOMY: SHX149

## 2016-06-20 ENCOUNTER — Encounter: Payer: Self-pay | Admitting: Internal Medicine

## 2016-06-22 ENCOUNTER — Encounter: Payer: Self-pay | Admitting: Internal Medicine

## 2016-06-23 ENCOUNTER — Ambulatory Visit: Payer: Self-pay | Admitting: Cardiovascular Disease

## 2016-06-30 ENCOUNTER — Ambulatory Visit: Payer: Self-pay | Admitting: Cardiovascular Disease

## 2016-07-07 ENCOUNTER — Encounter: Payer: Self-pay | Admitting: Cardiovascular Disease

## 2016-07-07 ENCOUNTER — Ambulatory Visit (INDEPENDENT_AMBULATORY_CARE_PROVIDER_SITE_OTHER): Payer: No Typology Code available for payment source | Admitting: Cardiovascular Disease

## 2016-07-07 VITALS — BP 120/77 | HR 59

## 2016-07-07 DIAGNOSIS — R002 Palpitations: Secondary | ICD-10-CM | POA: Diagnosis not present

## 2016-07-07 DIAGNOSIS — F419 Anxiety disorder, unspecified: Secondary | ICD-10-CM | POA: Diagnosis not present

## 2016-07-07 DIAGNOSIS — I10 Essential (primary) hypertension: Secondary | ICD-10-CM

## 2016-07-07 NOTE — Patient Instructions (Signed)

## 2016-07-07 NOTE — Progress Notes (Signed)
Cardiology Office Note  Date:  07/07/2016   ID:  Nicole Gould, DOB 07-23-1960, MRN ID:[id number]  PCP:  [name], MD   Chief Complaint  Patient presents with  . other    Early f/u due to BP issues. Reviewed meds with pt verbally.    HPI:  Nicole Gould is a very pleasant 56 year old woman with history of palpitations, hypertension who presents for follow-up of her Blood pressure. Previous history of occasional chest tightness Previous side effects on losartan, amlodipine Atenolol did not help her palpitations, change to bystolic  Had colo scheduled for dec 2017, Had previous reaction on prior GYN procedure in 2016 Long discussion concerning that procedure when she had severe hypertension during the case She brings records with her to the office today, systolic pressure after the start of the case up into the 190 range, close to [id number] systolic. Notes indicate she was given hydralazine. She did receive propofol 250mg  total, fentanyl 50 total,  Cause of the severe hypertension unclear, timing of when the medications were given is not clear Given previous history of hypertension during that case, she is concerned about being anesthesia during upcoming colonoscopy She does report having prior colonoscopy 3 years ago that went well without any complications She does report having propofol for that case as well with no issues  She is working out with a trainer at least 2 days per week Trying to work on her weight, is joining [employer] Blood pressure at home typically well controlled Continued stress at work, better since she is taking Friday off every week Reports that she has hot flashes from menopause Previous issues with stress pertaining to mother's illness and work She did receive Xanax from primary care, has not had to use this  EKG on today's visit shows normal sinus rhythm with rate 59 bpm, no significant ST or T-wave changes  Other past medical  history Previously started on losartan and she was on this for 1 week but reported having significant side effects of general malaise This was held and she was started on amlodipine which she only took for 1 day as this caused a headache  back on HCTZ one half pill and atenolol 25 mg daily. She's had been on this combination for a long time   lab work showing total cholesterol 183, LDL 106. This was reviewed with her   Myoview in 2005 for ectopy that was reportedly normal. She attributes her symptoms to menopause. Menopause symptoms have been getting worse over the past several years. Now with sweating, occasional palpitations, malaise.   history of concussion in April 2014 and was evaluated by Dr. Manuella Ghazi at [employer].  The injury occurred when she fell out of a chair and a metal bar on chair hit her in the back of head    Seeing a headache specialist in at [place]  [place] of CAD in father .   PMH:   has a past medical history of Chicken pox; Colon polyps; Concussion; Frequent headaches; GERD (gastroesophageal reflux disease); Hypertension; Migraines; Pre-eclampsia; Seizure disorder in pregnancy [facility]); and Seizures ([place]).  PSH:    Past Surgical History:  Procedure Laterality Date  . COLONOSCOPY    . DILATION AND CURETTAGE OF UTERUS     with hystereoscopy  . POLYPECTOMY    . TONSILLECTOMY    . TUBAL LIGATION      Current Outpatient Prescriptions  Medication Sig Dispense Refill  . ALPRAZolam (XANAX) 0.5 MG tablet Take 1 tablet (0.5  mg total) by mouth 2 (two) times daily as needed for anxiety. 20 tablet 0  . BIOTIN FORTE PO Take by mouth daily.    . Cholecalciferol (VITAMIN D-3) 1000 UNITS CAPS Take 1 capsule by mouth daily.    . hydrochlorothiazide (HYDRODIURIL) 25 MG tablet Take 0.5 tablets (12.5 mg total) by mouth daily. 90 tablet 3  . MAGNESIUM MALATE PO Take by mouth daily.    . Multiple Vitamins-Minerals (MULTIVITAMIN WITH MINERALS) tablet Take 1 tablet by mouth daily.    . nebivolol  (BYSTOLIC) 10 MG tablet TAKE 1 TABLET (10 MG TOTAL) BY MOUTH DAILY. 90 tablet 3  . Omega-3 Fatty Acids (FISH OIL) 1200 MG CAPS Take 1,200 mg by mouth daily.     . pantoprazole (PROTONIX) 40 MG tablet TAKE 1 TABLET 30 MINUTES BEFORE BREAKFAST 90 tablet 1   No current facility-administered medications for this visit.      Allergies:   Anesthetics, amide; Benadryl [diphenhydramine hcl (sleep)]; Fentanyl; and Losartan potassium-hctz   Social History:  The patient  reports that she has never smoked. She has never used smokeless tobacco. She reports that she drinks alcohol. She reports that she does not use drugs.   Family History:   family history includes Cancer in her maternal grandfather; Colon cancer in her maternal grandfather; Diabetes in her father and mother; Heart disease in her father; Hypertension in her father and mother; Kidney disease in her father.    Review of Systems: Review of Systems  Constitutional: Negative.   Respiratory: Negative.   Cardiovascular: Negative.   Gastrointestinal: Negative.   Musculoskeletal: Negative.   Neurological: Negative.   Psychiatric/Behavioral: The patient is nervous/anxious.   All other systems reviewed and are negative.    PHYSICAL EXAM: VS:  BP 120/77 (BP Location: Left Arm, Patient Position: Sitting, Cuff Size: Normal)   Pulse (!) 59  , BMI There is no height or weight on file to calculate BMI. GEN: Well nourished, well developed, in no acute distress  HEENT: normal  Neck: no JVD, carotid bruits, or masses Cardiac: RRR; no murmurs, rubs, or gallops,no edema  Respiratory:  clear to auscultation bilaterally, normal work of breathing GI: soft, nontender, nondistended, + BS MS: no deformity or atrophy  Skin: warm and dry, no rash Neuro:  Strength and sensation are intact Psych: euthymic mood, full affect    Recent Labs: 08/27/2015: BUN 18; Creatinine, Ser 0.73; Potassium 3.8; Sodium 142; TSH 0.92 09/03/2015: ALT 16    Lipid  Panel Lab Results  Component Value Date   CHOL 175 08/27/2015   HDL 55.90 08/27/2015   LDLCALC 108 (H) 08/27/2015   TRIG 55.0 08/27/2015      Wt Readings from Last 3 Encounters:  05/25/16 194 lb (88 kg)  05/03/16 188 lb (85.3 kg)  01/11/16 193 lb 12 oz (87.9 kg)       ASSESSMENT AND PLAN:  Essential hypertension, benign Long discussion concerning her hypertension Currently well-controlled Previous episode of hypertension during GYN procedure in 2016 discussed with her in detail. Etiology unclear. Systolic pressure close to 200 during the case either secondary to pain or reaction from medication. She reports having propofol in the past with colonoscopy several years ago with no complications. She denies having allergy to fentanyl which was also given in the case. Blood pressure improved in recovery but she did have some general malaise, nausea and was back in the emergency room that night for observation  Currently stable, no contraindication to her colonoscopy coming up  in March. Recommended if she is anxious that she take Xanax night before and morning of the procedure Would stay on her medications for blood pressure morning of the procedure If blood pressure does run high during the case potentially could give hydralazine, Nitropaste.Labetalol likely not given in the past given her bradycardia   Palpitations symptoms well controlled on bystolic  Previously had breakthrough symptoms on atenolol  Anxiety currently feels she is doing well Has not had to take Xanax Works 4 days per week which has helped her symptoms Encouraged her to continue her exercise at the gym   Total encounter time more than 25 minutes  Greater than 50% was spent in counseling and coordination of care with the patient   Disposition:   F/U  12 months  No orders of the defined types were placed in this encounter.    Signed, Esmond Plants, M.D., Ph.D. 07/07/2016  [place],  [place]

## 2016-07-12 ENCOUNTER — Ambulatory Visit (INDEPENDENT_AMBULATORY_CARE_PROVIDER_SITE_OTHER): Payer: No Typology Code available for payment source | Admitting: Family

## 2016-07-12 ENCOUNTER — Encounter: Payer: Self-pay | Admitting: Family

## 2016-07-12 VITALS — BP 122/64 | HR 63 | Temp 98.1°F | Ht 64.0 in | Wt 194.8 lb

## 2016-07-12 DIAGNOSIS — H1010 Acute atopic conjunctivitis, unspecified eye: Secondary | ICD-10-CM | POA: Insufficient documentation

## 2016-07-12 DIAGNOSIS — B373 Candidiasis of vulva and vagina: Secondary | ICD-10-CM | POA: Diagnosis not present

## 2016-07-12 DIAGNOSIS — J302 Other seasonal allergic rhinitis: Secondary | ICD-10-CM

## 2016-07-12 DIAGNOSIS — B3731 Acute candidiasis of vulva and vagina: Secondary | ICD-10-CM

## 2016-07-12 DIAGNOSIS — J3089 Other allergic rhinitis: Secondary | ICD-10-CM | POA: Insufficient documentation

## 2016-07-12 LAB — POCT INFLUENZA A/B
Influenza A, POC: NEGATIVE
Influenza B, POC: NEGATIVE

## 2016-07-12 MED ORDER — CETIRIZINE HCL 5 MG PO TABS: 5.0000 mg | ORAL_TABLET | Freq: Every day | ORAL | 3 refills | Status: DC

## 2016-07-12 MED ORDER — NYSTATIN 100000 UNIT/GM EX CREA: 1.0000 "application " | TOPICAL_CREAM | Freq: Two times a day (BID) | CUTANEOUS | 0 refills | Status: DC

## 2016-07-12 NOTE — Progress Notes (Signed)
Pre visit review using our clinic review tool, if applicable. No additional management support is needed unless otherwise documented below in the visit note. 

## 2016-07-12 NOTE — Assessment & Plan Note (Signed)
Flu negative. Duration 3 days. Afebrile. Patient and I jointly decided to start antihistamine to see if improves symptoms. Return precautions given.

## 2016-07-12 NOTE — Patient Instructions (Signed)
Suspect allergic or bowel calls. Let's start Zyrtec and see how your symptoms do. Nasal saline spray  If there is no improvement in your symptoms, or if there is any worsening of symptoms, or if you have any additional concerns, please return for re-evaluation; or, if we are closed, consider going to the Emergency Room for evaluation if symptoms urgent.  Allergic Rhinitis Allergic rhinitis is when the mucous membranes in the nose respond to allergens. Allergens are particles in the air that cause your body to have an allergic reaction. This causes you to release allergic antibodies. Through a chain of events, these eventually cause you to release histamine into the blood stream. Although meant to protect the body, it is this release of histamine that causes your discomfort, such as frequent sneezing, congestion, and an itchy, runny nose. What are the causes? Seasonal allergic rhinitis (hay fever) is caused by pollen allergens that may come from grasses, trees, and weeds. Year-round allergic rhinitis (perennial allergic rhinitis) is caused by allergens such as house dust mites, pet dander, and mold spores. What are the signs or symptoms?  Nasal stuffiness (congestion).  Itchy, runny nose with sneezing and tearing of the eyes. How is this diagnosed? Your health care provider can help you determine the allergen or allergens that trigger your symptoms. If you and your health care provider are unable to determine the allergen, skin or blood testing may be used. Your health care provider will diagnose your condition after taking your health history and performing a physical exam. Your health care provider may assess you for other related conditions, such as asthma, pink eye, or an ear infection. How is this treated? Allergic rhinitis does not have a cure, but it can be controlled by:  Medicines that block allergy symptoms. These may include allergy shots, nasal sprays, and oral  antihistamines.  Avoiding the allergen. Hay fever may often be treated with antihistamines in pill or nasal spray forms. Antihistamines block the effects of histamine. There are over-the-counter medicines that may help with nasal congestion and swelling around the eyes. Check with your health care provider before taking or giving this medicine. If avoiding the allergen or the medicine prescribed do not work, there are many new medicines your health care provider can prescribe. Stronger medicine may be used if initial measures are ineffective. Desensitizing injections can be used if medicine and avoidance does not work. Desensitization is when a patient is given ongoing shots until the body becomes less sensitive to the allergen. Make sure you follow up with your health care provider if problems continue. Follow these instructions at home: It is not possible to completely avoid allergens, but you can reduce your symptoms by taking steps to limit your exposure to them. It helps to know exactly what you are allergic to so that you can avoid your specific triggers. Contact a health care provider if:  You have a fever.  You develop a cough that does not stop easily (persistent).  You have shortness of breath.  You start wheezing.  Symptoms interfere with normal daily activities. This information is not intended to replace advice given to you by your health care provider. Make sure you discuss any questions you have with your health care provider. Document Released: 02/28/2001 Document Revised: 02/04/2016 Document Reviewed: 02/10/2013 Elsevier Interactive Patient Education  2017 [employer].

## 2016-07-12 NOTE — Progress Notes (Signed)
Subjective:    Patient ID: Nicole Gould, female    DOB: [birth year], 56 y.o.   MRN: [id number]  CC: Nicole Gould is a 56 y.o. female who presents today for an acute visit.    HPI: CC: dry cough x 3 days. Endorses nasal congestion, watery, sneezing, hoarseness. Tried Emergen-C.  History of seasonal allergies. No wheezing, SOB, fever, bodyaches.   Requesting refill for nystatin cream for occasional external vaginal yeast infection. No infection at this time.     HISTORY:  Past Medical History:  Diagnosis Date  . Chicken pox   . Colon polyps   . Concussion   . Frequent headaches   . GERD (gastroesophageal reflux disease)   . Hypertension   . Migraines   . Pre-eclampsia    with grand mal seizures  . Seizure disorder in pregnancy ([place])   . Seizures ([place])    with pregnancy 1981- pre clampsia    Past Surgical History:  Procedure Laterality Date  . COLONOSCOPY    . DILATION AND CURETTAGE OF UTERUS     with hystereoscopy  . POLYPECTOMY    . TONSILLECTOMY    . TUBAL LIGATION     Family History  Problem Relation Age of Onset  . Hypertension Mother   . Diabetes Mother   . Heart disease Father   . Kidney disease Father   . Hypertension Father   . Diabetes Father   . Cancer Maternal Grandfather     colon cancer  . Colon cancer Maternal Grandfather     passed age 70   . Colon polyps Neg Hx     Allergies: Anesthetics, amide; Benadryl [diphenhydramine hcl (sleep)]; Fentanyl; and Losartan potassium-hctz Current Outpatient Prescriptions on File Prior to Visit  Medication Sig Dispense Refill  . ALPRAZolam (XANAX) 0.5 MG tablet Take 1 tablet (0.5 mg total) by mouth 2 (two) times daily as needed for anxiety. 20 tablet 0  . BIOTIN FORTE PO Take by mouth daily.    . Cholecalciferol (VITAMIN D-3) 1000 UNITS CAPS Take 1 capsule by mouth daily.    . hydrochlorothiazide (HYDRODIURIL) 25 MG tablet Take 0.5 tablets (12.5 mg total) by mouth daily. 90 tablet 3  . MAGNESIUM  MALATE PO Take by mouth daily.    . Multiple Vitamins-Minerals (MULTIVITAMIN WITH MINERALS) tablet Take 1 tablet by mouth daily.    . nebivolol (BYSTOLIC) 10 MG tablet TAKE 1 TABLET (10 MG TOTAL) BY MOUTH DAILY. 90 tablet 3  . Omega-3 Fatty Acids (FISH OIL) 1200 MG CAPS Take 1,200 mg by mouth daily.     . pantoprazole (PROTONIX) 40 MG tablet TAKE 1 TABLET 30 MINUTES BEFORE BREAKFAST 90 tablet 1   No current facility-administered medications on file prior to visit.     Social History  Substance Use Topics  . Smoking status: Never Smoker  . Smokeless tobacco: Never Used  . Alcohol use Yes     Comment: very rare - glass red wine when that     Review of Systems  Constitutional: Negative for chills and fever.  HENT: Positive for congestion, rhinorrhea, sneezing and voice change. Negative for sore throat.   Respiratory: Positive for cough.   Cardiovascular: Negative for chest pain and palpitations.  Gastrointestinal: Negative for nausea and vomiting.      Objective:    BP 122/64   Pulse 63   Temp 98.1 F (36.7 C) (Oral)   Ht 5\' 4"  (1.626 m)   Wt 194 lb 12.8 oz (88.4 kg)  SpO2 97%   BMI 33.44 kg/m    Physical Exam  Constitutional: She appears well-developed and well-nourished.  HENT:  Head: Normocephalic and atraumatic.  Right Ear: Hearing, tympanic membrane, external ear and ear canal normal. No drainage, swelling or tenderness. No foreign bodies. Tympanic membrane is not erythematous and not bulging. No middle ear effusion. No decreased hearing is noted.  Left Ear: Hearing, tympanic membrane, external ear and ear canal normal. No drainage, swelling or tenderness. No foreign bodies. Tympanic membrane is not erythematous and not bulging.  No middle ear effusion. No decreased hearing is noted.  Nose: Rhinorrhea present. Right sinus exhibits no maxillary sinus tenderness and no frontal sinus tenderness. Left sinus exhibits no maxillary sinus tenderness and no frontal sinus  tenderness.  Mouth/Throat: Uvula is midline, oropharynx is clear and moist and mucous membranes are normal. No oropharyngeal exudate, posterior oropharyngeal edema, posterior oropharyngeal erythema or tonsillar abscesses.  Eyes: Conjunctivae are normal.  Cardiovascular: Regular rhythm, normal heart sounds and normal pulses.   Pulmonary/Chest: Effort normal and breath sounds normal. She has no wheezes. She has no rhonchi. She has no rales.  Lymphadenopathy:       Head (right side): No submental, no submandibular, no tonsillar, no preauricular, no posterior auricular and no occipital adenopathy present.       Head (left side): No submental, no submandibular, no tonsillar, no preauricular, no posterior auricular and no occipital adenopathy present.    She has no cervical adenopathy.  Neurological: She is alert.  Skin: Skin is warm and dry.  Psychiatric: She has a normal mood and affect. Her speech is normal and behavior is normal. Thought content normal.  Vitals reviewed.      Assessment & Plan:   Problem List Items Addressed This Visit      Respiratory   Acute seasonal allergic rhinitis - Primary    Flu negative. Duration 3 days. Afebrile. Patient and I jointly decided to start antihistamine to see if improves symptoms. Return precautions given.      Relevant Medications   cetirizine (ZYRTEC) 5 MG tablet   Other Relevant Orders   POCT Influenza A/B (Completed)     Genitourinary   Vaginal yeast infection    No current symptoms. Requesting refill. Prescription given.      Relevant Medications   nystatin cream (MYCOSTATIN)         I am having Nicole Gould start on nystatin cream and cetirizine. I am also having her maintain her multivitamin with minerals, Vitamin D-3, Fish Oil, nebivolol, hydrochlorothiazide, pantoprazole, MAGNESIUM MALATE PO, BIOTIN FORTE PO, and ALPRAZolam.   Meds ordered this encounter  Medications  . nystatin cream (MYCOSTATIN)    Sig: Apply 1  application topically 2 (two) times daily.    Dispense:  30 g    Refill:  0    Order Specific Question:   Supervising Provider    Answer:   Deborra Medina L [[id number]]  . cetirizine (ZYRTEC) 5 MG tablet    Sig: Take 1 tablet (5 mg total) by mouth daily.    Dispense:  30 tablet    Refill:  3    Order Specific Question:   Supervising Provider    Answer:   [name] [[id number]]    Return precautions given.   Risks, benefits, and alternatives of the medications and treatment plan prescribed today were discussed, and patient expressed understanding.   Education regarding symptom management and diagnosis given to patient on AVS.  Continue to follow with  [name], MD for routine health maintenance.   Nicole Gould and I agreed with plan.   Mable Paris, FNP

## 2016-07-12 NOTE — Assessment & Plan Note (Signed)
No current symptoms. Requesting refill. Prescription given.

## 2016-07-13 NOTE — Addendum Note (Signed)
Addended by: Kittie Plater on: 07/13/2016 07:49 AM   Modules accepted: Orders

## 2016-08-03 ENCOUNTER — Encounter (HOSPITAL_COMMUNITY): Payer: Self-pay | Admitting: Emergency Medicine

## 2016-08-03 ENCOUNTER — Ambulatory Visit (HOSPITAL_COMMUNITY)
Admission: EM | Admit: 2016-08-03 | Discharge: 2016-08-03 | Disposition: A | Discharge status: Home / Self Care | Payer: No Typology Code available for payment source | Attending: Family Medicine | Admitting: Family Medicine

## 2016-08-03 DIAGNOSIS — J029 Acute pharyngitis, unspecified: Secondary | ICD-10-CM | POA: Insufficient documentation

## 2016-08-03 LAB — POCT RAPID STREP A: STREPTOCOCCUS, GROUP A SCREEN (DIRECT): NEGATIVE

## 2016-08-03 MED ORDER — AMOXICILLIN 875 MG PO TABS: 875.0000 mg | ORAL_TABLET | Freq: Two times a day (BID) | ORAL | 0 refills | Status: DC

## 2016-08-03 NOTE — ED Triage Notes (Signed)
Pt c/o cold sx onset: yest  Sx include: ST, fever, submandibular swelling, chills, and feeling "sluggish"  Taking: OTC cold meds w/temp relief.   A&O x4... NAD

## 2016-08-03 NOTE — ED Provider Notes (Signed)
[facility]    CSN: [id number] Arrival date & time: 08/03/16  1419     History   Chief Complaint Chief Complaint  Patient presents with  . URI    HPI Nicole Gould is a 56 y.o. female.   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 He's had no nausea or vomiting. She has had some      Past Medical History:  Diagnosis Date  . Chicken pox   . Colon polyps   . Concussion   . Frequent headaches   . GERD (gastroesophageal reflux disease)   . Hypertension   . Migraines   . Pre-eclampsia    with grand mal seizures  . Seizure disorder in pregnancy ([place])   . Seizures ([place])    with pregnancy 1981- pre clampsia     Patient Active Problem List   Diagnosis Date Noted  . Vaginal yeast infection 07/12/2016  . Acute seasonal allergic rhinitis 07/12/2016  . 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 Menopause present, declines hormone replacement therapy 10/25/2014  . Insomnia 05/05/2014  . Anxiety 05/05/2014  . Chronic venous insufficiency 04/21/2014  . Adenomatous polyp of colon 05/12/2013  . Visit for preventive health examination 03/14/2013  . Essential hypertension, benign 01/28/2013  . Palpitations 01/28/2013  . 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 COLONOSCOPY    . DILATION AND CURETTAGE OF UTERUS     with hystereoscopy  . POLYPECTOMY    . TONSILLECTOMY    . TUBAL LIGATION      OB History    No data available       Home Medications    Prior to Admission medications   Medication Sig Start Date End Date Taking? Authorizing Provider  BIOTIN FORTE PO Take by mouth daily.   Yes Historical Provider, MD  cetirizine (ZYRTEC) 5 MG tablet Take 1 tablet (5 mg total) by mouth daily. 07/12/16  Yes Burnard Hawthorne, FNP  Cholecalciferol (VITAMIN D-3) 1000 UNITS CAPS  Take 1 capsule by mouth daily.   Yes Historical Provider, MD  hydrochlorothiazide (HYDRODIURIL) 25 MG tablet Take 0.5 tablets (12.5 mg total) by mouth daily. 11/23/15  Yes Minna Merritts, MD  MAGNESIUM MALATE PO Take by mouth daily.   Yes Historical Provider, MD  Multiple Vitamins-Minerals (MULTIVITAMIN WITH MINERALS) tablet Take 1 tablet by mouth daily.   Yes Historical Provider, MD  nebivolol (BYSTOLIC) 10 MG tablet TAKE 1 TABLET (10 MG TOTAL) BY MOUTH DAILY. 11/23/15  Yes Minna Merritts, MD  nystatin cream (MYCOSTATIN) Apply 1 application topically 2 (two) times daily. 07/12/16  Yes Burnard Hawthorne, FNP  pantoprazole (PROTONIX) 40 MG tablet TAKE 1 TABLET 30 MINUTES BEFORE BREAKFAST 03/01/16  Yes [name], MD  ALPRAZolam Duanne Moron) 0.5 MG tablet Take 1 tablet (0.5 mg total) by mouth 2 (two) times daily as needed for anxiety. 05/25/16   [name], MD  amoxicillin (AMOXIL) 875 MG tablet Take 1 tablet (875 mg total) by mouth 2 (two) times daily. 08/03/16   Robyn Haber, MD  Omega-3 Fatty Acids (FISH OIL) 1200 MG CAPS Take 1,200 mg by mouth daily.     Historical Provider, MD    Family History Family History  Problem Relation Age of Onset  . Hypertension Mother   . Diabetes Mother   .  Heart disease Father   . Kidney disease Father   . Hypertension Father   . Diabetes Father   . Cancer Maternal Grandfather     colon cancer  . Colon cancer Maternal Grandfather     passed age 67   . Colon polyps Neg Hx     Social History Social History  Substance Use Topics  . Smoking status: Never Smoker  . Smokeless tobacco: Never Used  . Alcohol use Yes     Comment: very rare - glass red wine when that      Allergies   Anesthetics, amide; Benadryl [diphenhydramine hcl (sleep)]; Fentanyl; and Losartan potassium-hctz   Review of Systems Review of Systems  Constitutional: Positive for diaphoresis and fatigue.  HENT: Positive for sore throat.   Respiratory: Positive for cough.       Physical Exam Triage Vital Signs ED Triage Vitals [08/03/16 1532]  Enc Vitals Group     BP 146/88     Pulse Rate 82     Resp 18     Temp 98.7 F (37.1 C)     Temp Source Oral     SpO2 97 %     Weight      Height      Head Circumference      Peak Flow      Pain Score      Pain Loc      Pain Edu?      Excl. in [place]?    No data found.   Updated Vital Signs BP 146/88 (BP Location: Right Arm)   Pulse 82   Temp 98.7 F (37.1 C) (Oral)   Resp 18   SpO2 97%    Physical Exam  Constitutional: She is oriented to person, place, and time. She appears well-developed and well-nourished.  HENT:  Right Ear: External ear normal.  Left Ear: External ear normal.  Mouth/Throat: Oropharyngeal exudate present.  Eyes: Conjunctivae are normal.  Neck: Normal range of motion. Neck supple.  Cardiovascular: Normal rate, regular rhythm and normal heart sounds.   Pulmonary/Chest: Effort normal and breath sounds normal.  Musculoskeletal: Normal range of motion. She exhibits deformity.  Neurological: She is alert and oriented to person, place, and time.  Skin: Skin is dry.  Nursing note and vitals reviewed.    UC Treatments / Results  Labs (all labs ordered are listed, but only abnormal results are displayed) Labs Reviewed  POCT RAPID STREP A    EKG  EKG Interpretation None       Radiology No results found.  Procedures Procedures (including critical care time)  Medications Ordered in UC Medications - No data to display   Initial Impression / Assessment and Plan / UC Course  I have reviewed the triage vital signs and the nursing notes.  Pertinent labs & imaging results that were available during my care of the patient were reviewed by me and considered in my medical decision making (see chart for details).     Final Clinical Impressions(s) / UC Diagnoses   Final diagnoses:  Pharyngitis, unspecified etiology    New Prescriptions New Prescriptions    AMOXICILLIN (AMOXIL) 875 MG TABLET    Take 1 tablet (875 mg total) by mouth 2 (two) times daily.     Robyn Haber, MD 08/03/16 [phone]

## 2016-08-04 ENCOUNTER — Other Ambulatory Visit: Payer: Self-pay | Admitting: Obstetrics and Gynecology

## 2016-08-04 DIAGNOSIS — Z86010 Personal history of colonic polyps: Secondary | ICD-10-CM | POA: Insufficient documentation

## 2016-08-06 LAB — CULTURE, GROUP A STREP (THRC)

## 2016-08-07 LAB — CYTOLOGY - PAP

## 2016-08-09 DIAGNOSIS — N898 Other specified noninflammatory disorders of vagina: Secondary | ICD-10-CM | POA: Insufficient documentation

## 2016-08-09 DIAGNOSIS — E669 Obesity, unspecified: Secondary | ICD-10-CM | POA: Insufficient documentation

## 2016-08-11 ENCOUNTER — Ambulatory Visit (AMBULATORY_SURGERY_CENTER): Payer: Self-pay | Admitting: *Deleted

## 2016-08-11 VITALS — Ht 64.0 in | Wt 196.0 lb

## 2016-08-11 DIAGNOSIS — Z86010 Personal history of colonic polyps: Secondary | ICD-10-CM

## 2016-08-11 MED ORDER — NA SULFATE-K SULFATE-MG SULF 17.5-3.13-1.6 GM/177ML PO SOLN: ORAL | 0 refills | Status: DC

## 2016-08-11 NOTE — Progress Notes (Signed)
Patient denies any allergies to eggs or soy. Patient denies any oxygen use at home and does not take any diet/weight loss medications. EMMI education declined by patient. Patient was very anxious during PV today. She states she had D&C 2016 and had bad reaction to anesthesia, very high BP and had to go to ED. Records given show she received Proprofol 250 mg,fentanyl 50 mcg, Toradol 30 mg, Zofran 4 mg. I took this information/records and spoke with Dr.Pyrtle. He states she should do fine with [place] since she had colon 2014 with propofol without any problems. He also recommended patient take xanax 1 pill twice a day the day before colonoscopy and 1 pill morning of procedure. If still nervous about having colonoscopy she may make office visit. I called patient and gave her Dr.Pyrtle's recommendations. She was very thankful!

## 2016-08-24 ENCOUNTER — Other Ambulatory Visit: Payer: Self-pay | Admitting: Internal Medicine

## 2016-08-24 NOTE — Telephone Encounter (Signed)
Refilled on 03/01/2016 Last Ov: 09/03/2015 Next Ov: not scheduled.   Please advise.

## 2016-08-25 NOTE — Telephone Encounter (Signed)
REFILLED. FOR 90 DAYS,  NEEDS OV

## 2016-08-28 ENCOUNTER — Encounter: Payer: Self-pay | Admitting: Internal Medicine

## 2016-08-28 ENCOUNTER — Ambulatory Visit (AMBULATORY_SURGERY_CENTER): Payer: No Typology Code available for payment source | Admitting: Internal Medicine

## 2016-08-28 VITALS — BP 142/70 | HR 52 | Temp 95.5°F | Resp 15 | Ht 64.0 in | Wt 196.0 lb

## 2016-08-28 DIAGNOSIS — Z86010 Personal history of colonic polyps: Secondary | ICD-10-CM | POA: Diagnosis present

## 2016-08-28 DIAGNOSIS — D128 Benign neoplasm of rectum: Secondary | ICD-10-CM | POA: Diagnosis not present

## 2016-08-28 MED ORDER — SODIUM CHLORIDE 0.9 % IV SOLN: 500.0000 mL | INTRAVENOUS | Status: DC

## 2016-08-28 NOTE — Telephone Encounter (Signed)
LMTCB. Need to schedule a follow up appt.

## 2016-08-28 NOTE — Op Note (Signed)
[facility] Patient Name: Nicole Gould Procedure Date: 08/28/2016 8:00 AM MRN: [id number] Endoscopist: Jerene Bears , MD Age: 56 Referring MD:  Date of Birth: [birth year] Gender: Female Account #: [id number] Procedure:                Colonoscopy Indications:              Surveillance: Personal history of adenomatous                            polyps on last colonoscopy 3 years ago Medicines:                Monitored Anesthesia Care Procedure:                Pre-Anesthesia Assessment:                           - Prior to the procedure, a History and Physical                            was performed, and patient medications and                            allergies were reviewed. The patient's tolerance of                            previous anesthesia was also reviewed. The risks                            and benefits of the procedure and the sedation                            options and risks were discussed with the patient.                            All questions were answered, and informed consent                            was obtained. Prior Anticoagulants: The patient has                            taken no previous anticoagulant or antiplatelet                            agents. ASA Grade Assessment: II - A patient with                            mild systemic disease. After reviewing the risks                            and benefits, the patient was deemed in                            satisfactory condition to undergo the procedure.  After obtaining informed consent, the colonoscope                            was passed under direct vision. Throughout the                            procedure, the patient's blood pressure, pulse, and                            oxygen saturations were monitored continuously. The                            Colonoscope was introduced through the anus and                            advanced to the the cecum,  identified by                            appendiceal orifice and ileocecal valve. The                            colonoscopy was performed without difficulty. The                            patient tolerated the procedure well. The quality                            of the bowel preparation was excellent. The                            ileocecal valve, appendiceal orifice, and rectum                            were photographed. Scope In: 8:15:20 AM Scope Out: 8:31:19 AM Scope Withdrawal Time: 0 hours 12 minutes 15 seconds  Total Procedure Duration: 0 hours 15 minutes 59 seconds  Findings:                 The digital rectal exam was normal.                           A 3 mm polyp was found in the rectum. The polyp was                            sessile. The polyp was removed with a cold biopsy                            forceps. Resection and retrieval were complete.                           The exam was otherwise without abnormality. Complications:            No immediate complications. Estimated Blood Loss:     Estimated blood loss was minimal. Impression:               -  One 3 mm polyp in the rectum, removed with a cold                            biopsy forceps. Resected and retrieved.                           - The examination was otherwise normal. Recommendation:           - Patient has a contact number available for                            emergencies. The signs and symptoms of potential                            delayed complications were discussed with the                            patient. Return to normal activities tomorrow.                            Written discharge instructions were provided to the                            patient.                           - Resume previous diet.                           - Continue present medications.                           - Await pathology results.                           - Repeat colonoscopy is recommended for                             surveillance. The colonoscopy date will be                            determined after pathology results from today's                            exam become available for review. Jerene Bears, MD 08/28/2016 8:33:37 AM This report has been signed electronically.

## 2016-08-28 NOTE — Progress Notes (Signed)
TO PACU VSS Report to RN

## 2016-08-28 NOTE — Progress Notes (Signed)
Called to room to assist during endoscopic procedure.  Patient ID and intended procedure confirmed with present staff. Received instructions for my participation in the procedure from the performing physician.  

## 2016-08-28 NOTE — Patient Instructions (Signed)
YOU HAD AN ENDOSCOPIC PROCEDURE TODAY AT [facility]:   Refer to the procedure report that was given to you for any specific questions about what was found during the examination.  If the procedure report does not answer your questions, please call your gastroenterologist to clarify.  If you requested that your care partner not be given the details of your procedure findings, then the procedure report has been included in a sealed envelope for you to review at your convenience later.  YOU SHOULD EXPECT: Some feelings of bloating in the abdomen. Passage of more gas than usual.  Walking can help get rid of the air that was put into your GI tract during the procedure and reduce the bloating. If you had a lower endoscopy (such as a colonoscopy or flexible sigmoidoscopy) you may notice spotting of blood in your stool or on the toilet paper. If you underwent a bowel prep for your procedure, you may not have a normal bowel movement for a few days.  Please Note:  You might notice some irritation and congestion in your nose or some drainage.  This is from the oxygen used during your procedure.  There is no need for concern and it should clear up in a day or so.  SYMPTOMS TO REPORT IMMEDIATELY:   Following lower endoscopy (colonoscopy or flexible sigmoidoscopy):  Excessive amounts of blood in the stool  Significant tenderness or worsening of abdominal pains  Swelling of the abdomen that is new, acute  Fever of 100F or higher    For urgent or emergent issues, a gastroenterologist can be reached at any hour by calling [phone].   DIET:  We do recommend a small meal at first, but then you may proceed to your regular diet.  Drink plenty of fluids but you should avoid alcoholic beverages for 24 hours.  ACTIVITY:  You should plan to take it easy for the rest of today and you should NOT DRIVE or use heavy machinery until tomorrow (because of the sedation medicines used during the test).     FOLLOW UP: Our staff will call the number listed on your records the next business day following your procedure to check on you and address any questions or concerns that you may have regarding the information given to you following your procedure. If we do not reach you, we will leave a message.  However, if you are feeling well and you are not experiencing any problems, there is no need to return our call.  We will assume that you have returned to your regular daily activities without incident.  If any biopsies were taken you will be contacted by phone or by letter within the next 1-3 weeks.  Please call us at [phone] if you have not heard about the biopsies in 3 weeks.   Polyps (handout given) Await for biopsy results to determined next repeat Colonoscopy screening   SIGNATURES/CONFIDENTIALITY: You and/or your care partner have signed paperwork which will be entered into your electronic medical record.  These signatures attest to the fact that that the information above on your After Visit Summary has been reviewed and is understood.  Full responsibility of the confidentiality of this discharge information lies with you and/or your care-partner.

## 2016-08-28 NOTE — Progress Notes (Signed)
Pt's states no medical or surgical changes since previsit or office visit. 

## 2016-08-29 ENCOUNTER — Telehealth: Payer: Self-pay | Admitting: Internal Medicine

## 2016-08-29 ENCOUNTER — Other Ambulatory Visit: Payer: Self-pay

## 2016-08-29 ENCOUNTER — Telehealth: Payer: Self-pay

## 2016-08-29 ENCOUNTER — Telehealth: Payer: Self-pay | Admitting: *Deleted

## 2016-08-29 DIAGNOSIS — J302 Other seasonal allergic rhinitis: Secondary | ICD-10-CM

## 2016-08-29 NOTE — Telephone Encounter (Signed)
Spoke with pt and she is aware. Pt also requests a work note. Note mailed to pt.

## 2016-08-29 NOTE — Telephone Encounter (Signed)
  Follow up Call-  Call back number 08/28/2016  Post procedure Call Back phone  # [phone]  Permission to leave phone message Yes  Some recent data might be hidden    Patient was called for follow up after her procedure on 08/28/16. No answer at the number given for follow up phone call. A message was left on the answering machine.

## 2016-08-29 NOTE — Telephone Encounter (Signed)
Pt states that ever since her procedure yesterday she has been sneezing and her eye has been watering and one side of her nose is running nonstop. States she took a zyrtec for this but she couldn't even go to work due to her nose running so bad. Pt states she has not had any aching or fever. Please advise.

## 2016-08-29 NOTE — Telephone Encounter (Signed)
No answer

## 2016-08-29 NOTE — Telephone Encounter (Signed)
Not a common reaction to propofol but I cannot completely exclude that this is related Agree with Zyrtec 10 mg daily If this is not effective can try Benadryl 25-50 mg every 6 hours as needed. This will call sedation and she needs to be careful with driving or using machinery he missed medication Flonase over-the-counter for several days if needed For any cough, fever, sore throat she needs to see primary care and have influenza excluded If this is related to procedural medication it should rapidly improve

## 2016-09-01 ENCOUNTER — Encounter: Payer: Self-pay | Admitting: Internal Medicine

## 2016-09-05 NOTE — Telephone Encounter (Signed)
Pt has an appt scheduled for 10/06/2016 @ 1:30 with Dr. [name].

## 2016-09-08 ENCOUNTER — Other Ambulatory Visit: Payer: Self-pay | Admitting: Obstetrics and Gynecology

## 2016-10-04 ENCOUNTER — Encounter: Payer: Self-pay | Admitting: Medical Oncology

## 2016-10-04 ENCOUNTER — Ambulatory Visit: Payer: Self-pay | Admitting: Family Medicine

## 2016-10-04 ENCOUNTER — Emergency Department
Admission: EM | Admit: 2016-10-04 | Discharge: 2016-10-04 | Disposition: A | Discharge status: Home / Self Care | Payer: No Typology Code available for payment source | Attending: Emergency Medicine | Admitting: Emergency Medicine

## 2016-10-04 ENCOUNTER — Emergency Department: Payer: No Typology Code available for payment source

## 2016-10-04 ENCOUNTER — Telehealth: Payer: Self-pay | Admitting: Internal Medicine

## 2016-10-04 DIAGNOSIS — R42 Dizziness and giddiness: Secondary | ICD-10-CM | POA: Diagnosis present

## 2016-10-04 DIAGNOSIS — Z79899 Other long term (current) drug therapy: Secondary | ICD-10-CM | POA: Diagnosis not present

## 2016-10-04 DIAGNOSIS — B349 Viral infection, unspecified: Secondary | ICD-10-CM | POA: Insufficient documentation

## 2016-10-04 DIAGNOSIS — I10 Essential (primary) hypertension: Secondary | ICD-10-CM | POA: Diagnosis not present

## 2016-10-04 LAB — COMPREHENSIVE METABOLIC PANEL
ALBUMIN: 4.1 g/dL (ref 3.5–5.0)
ALK PHOS: 37 U/L — AB (ref 38–126)
ALT: 18 U/L (ref 14–54)
AST: 27 U/L (ref 15–41)
Anion gap: 7 (ref 5–15)
BILIRUBIN TOTAL: 0.9 mg/dL (ref 0.3–1.2)
BUN: 15 mg/dL (ref 6–20)
CALCIUM: 9.4 mg/dL (ref 8.9–10.3)
CO2: 25 mmol/L (ref 22–32)
CREATININE: 0.7 mg/dL (ref 0.44–1.00)
Chloride: 109 mmol/L (ref 101–111)
GFR calc Af Amer: >60 mL/min (ref >60)
GFR calc non Af Amer: >60 mL/min (ref >60)
GLUCOSE: 101 mg/dL — AB (ref 65–99)
Potassium: 3.8 mmol/L (ref 3.5–5.1)
SODIUM: 141 mmol/L (ref 135–145)
TOTAL PROTEIN: 7.7 g/dL (ref 6.5–8.1)

## 2016-10-04 LAB — TROPONIN I: Troponin I: <0.03 ng/mL (ref <0.03)

## 2016-10-04 LAB — CBC WITH DIFFERENTIAL/PLATELET
BASOS PCT: 1 %
Basophils Absolute: 0 10*3/uL (ref 0–0.1)
EOS ABS: 0 10*3/uL (ref 0–0.7)
EOS PCT: 1 %
HCT: 39.6 % (ref 35.0–47.0)
Hemoglobin: 12.9 g/dL (ref 12.0–16.0)
LYMPHS ABS: 1.4 10*3/uL (ref 1.0–3.6)
Lymphocytes Relative: 42 %
MCH: 30.1 pg (ref 26.0–34.0)
MCHC: 32.7 g/dL (ref 32.0–36.0)
MCV: 92 fL (ref 80.0–100.0)
MONOS PCT: 5 %
Monocytes Absolute: 0.2 10*3/uL (ref 0.2–0.9)
Neutro Abs: 1.7 10*3/uL (ref 1.4–6.5)
Neutrophils Relative %: 51 %
Platelets: 175 10*3/uL (ref 150–440)
RBC: 4.3 MIL/uL (ref 3.80–5.20)
RDW: 15.4 % — ABNORMAL HIGH (ref 11.5–14.5)
WBC: 3.3 10*3/uL — ABNORMAL LOW (ref 3.6–11.0)

## 2016-10-04 LAB — URINALYSIS, COMPLETE (UACMP) WITH MICROSCOPIC
BACTERIA UA: NONE SEEN
BILIRUBIN URINE: NEGATIVE
Glucose, UA: NEGATIVE mg/dL
HGB URINE DIPSTICK: NEGATIVE
KETONES UR: NEGATIVE mg/dL
LEUKOCYTES UA: NEGATIVE
NITRITE: NEGATIVE
Protein, ur: NEGATIVE mg/dL
Specific Gravity, Urine: 1.008 (ref 1.005–1.030)
pH: 7 (ref 5.0–8.0)

## 2016-10-04 LAB — GLUCOSE, CAPILLARY: Glucose-Capillary: 100 mg/dL — ABNORMAL HIGH (ref 65–99)

## 2016-10-04 MED ORDER — SODIUM CHLORIDE 0.9 % IV SOLN
Freq: Once | INTRAVENOUS | Status: AC
  Administered 2016-10-04: 12:00:00 via INTRAVENOUS

## 2016-10-04 NOTE — Telephone Encounter (Signed)
Pt called stating that her blood sugar dropped last week and she knows that it takes a few days to feel better but she says she is not feeling any better. Scheduled pt with Dr. Lacinda Axon but he would really like for her to see Dr. [name]. Is there anyway that we can schedule her with Dr. [name] today.

## 2016-10-04 NOTE — ED Triage Notes (Signed)
Pt reports she had her blood sugar drop last Tuesday and since then she has been feeling dizzy. Pt reports hx of same. Denies pain.

## 2016-10-04 NOTE — ED Notes (Signed)
Pt verbalized understanding of discharge instructions. NAD at this time. 

## 2016-10-04 NOTE — ED Provider Notes (Signed)
[facility] Emergency Department Provider Note   ____________________________________________   First MD Initiated Contact with Patient 10/04/16 1039     (approximate)  I have reviewed the triage vital signs and the nursing notes.   HISTORY  Chief Complaint Dizziness   HPI Nicole Gould is a 56 y.o. female who reports she's had episodes of blood sugar dropping in the past. One time years ago went down to 40. She usually feels well a few days later but for a few days after the blood sugar drop she feels weak and dizzy. She had an episode where she was sitting in her car eating a chicken salad because she had been hungry for bed and was afraid her blood sugar would drop and it did drop as she was eating the chicken salad she started feeling weak and dizzy like she might pass out. She went to the gas station got some orange juice and peanut butter and 8 that solid felt somewhat better went home I down rested. She reports that happened a week ago when she still feeling weak and dizzy and unsteady and lightheaded. It's unusual for her to feel like this this long after one of her episodes. Her other medical problems include palpitations for which takes by systolic and sees a cardiologist. She reports a stress test several years ago that was normal.   Past Medical History:  Diagnosis Date  . Chicken pox   . Colon polyps   . Concussion   . Frequent headaches   . GERD (gastroesophageal reflux disease)   . Hypertension   . Migraines   . Pre-eclampsia    with grand mal seizures  . Seizure disorder in pregnancy ([facility])   . Seizures ([place])    with pregnancy 1981- pre clampsia     Patient Active Problem List   Diagnosis Date Noted  . Vaginal yeast infection 07/12/2016  . Acute seasonal allergic rhinitis 07/12/2016  . 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 Menopause present, declines hormone replacement therapy 10/25/2014  . Insomnia 05/05/2014  . Anxiety  05/05/2014  . Chronic venous insufficiency 04/21/2014  . Adenomatous polyp of colon 05/12/2013  . Visit for preventive health examination 03/14/2013  . Pain 03/11/2013  . Essential hypertension, benign 01/28/2013  . Palpitations 01/28/2013  . 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 COLONOSCOPY    . DILATION AND CURETTAGE OF UTERUS  09/08/2014   with hystereoscopy  . POLYPECTOMY    . TONSILLECTOMY    . TUBAL LIGATION      Prior to Admission medications   Medication Sig Start Date End Date Taking? Authorizing Provider  BIOTIN FORTE PO Take by mouth daily.   Yes Historical Provider, MD  Cholecalciferol (VITAMIN D-3) 1000 UNITS CAPS Take 1 capsule by mouth daily.   Yes Historical Provider, MD  hydrochlorothiazide (HYDRODIURIL) 25 MG tablet Take 0.5 tablets (12.5 mg total) by mouth daily. 11/23/15  Yes Minna Merritts, MD  MAGNESIUM MALATE PO Take 1 tablet by mouth daily.    Yes Historical Provider, MD  Multiple Vitamins-Minerals (MULTIVITAMIN WITH MINERALS) tablet Take 1 tablet by mouth daily.   Yes Historical Provider, MD  nebivolol (BYSTOLIC) 10 MG tablet TAKE 1 TABLET (10 MG TOTAL) BY MOUTH DAILY. 11/23/15  Yes Minna Merritts, MD  Omega-3 Fatty Acids (FISH OIL) 1200 MG CAPS Take 1,200 mg by mouth daily.    Yes Historical Provider, MD  pantoprazole (  PROTONIX) 40 MG tablet TAKE 1 TABLET 30 MINUTES BEFORE BREAKFAST 08/25/16  Yes [name], MD  ALPRAZolam Duanne Moron) 0.5 MG tablet Take 1 tablet (0.5 mg total) by mouth 2 (two) times daily as needed for anxiety. Patient not taking: Reported on 08/28/2016 05/25/16   [name], MD  cetirizine (ZYRTEC) 5 MG tablet Take 1 tablet (5 mg total) by mouth daily. Patient not taking: Reported on 08/28/2016 07/12/16   Burnard Hawthorne, FNP  nystatin cream (MYCOSTATIN) Apply 1 application topically 2 (two) times daily. Patient not taking: Reported on 08/28/2016 07/12/16   Burnard Hawthorne, FNP     Allergies Anesthetics, amide; Benadryl [diphenhydramine hcl (sleep)]; Fentanyl; and Losartan potassium-hctz  Family History  Problem Relation Age of Onset  . Hypertension Mother   . Diabetes Mother   . Heart disease Father   . Kidney disease Father   . Hypertension Father   . Diabetes Father   . Cancer Maternal Grandfather     colon cancer  . Colon cancer Maternal Grandfather     passed age 8   . Colon polyps Neg Hx     Social History Social History  Substance Use Topics  . Smoking status: Never Smoker  . Smokeless tobacco: Never Used  . Alcohol use Yes     Comment: very rare - glass red wine when that     Review of Systems Constitutional: No fever/chills Eyes: No visual changes. ENT: No sore throat. Cardiovascular: Denies chest pain. Respiratory: Denies shortness of breath. Gastrointestinal: No abdominal pain.  No nausea, no vomiting.  No diarrhea.  No constipation. Genitourinary: Negative for dysuria. Musculoskeletal: Negative for back pain. Skin: Negative for rash. Neurological: See history of present illness 10-point ROS otherwise negative.  ____________________________________________   PHYSICAL EXAM:  VITAL SIGNS: ED Triage Vitals  Enc Vitals Group     BP 10/04/16 1018 (!) 174/67     Pulse Rate 10/04/16 1018 62     Resp 10/04/16 1018 18     Temp 10/04/16 1018 98.2 F (36.8 C)     Temp Source 10/04/16 1018 Oral     SpO2 10/04/16 1018 100 %     Weight 10/04/16 1018 196 lb (88.9 kg)     Height --      Head Circumference --      Peak Flow --      Pain Score 10/04/16 1041 3     Pain Loc --      Pain Edu? --      Excl. in [place]? --     Constitutional: Alert and oriented. Well appearing and in no acute distress. Eyes: Conjunctivae are normal. PERRL. EOMI.Funduscopic normal Head: Atraumatic. Nose: No congestion/rhinnorhea. Mouth/Throat: Mucous membranes are moist.  Oropharynx non-erythematous. Neck: No stridor.  No cervical spine tenderness to    Hematological/Lymphatic/Immunilogical: No cervical lymphadenopathy. Cardiovascular: Normal rate, regular rhythm. Grossly normal heart sounds.  Good peripheral circulation. Respiratory: Normal respiratory effort.  No retractions. Lungs CTAB. Gastrointestinal: Soft and nontender. No distention. No abdominal bruits. No CVA tenderness. Musculoskeletal: No lower extremity tenderness nor edema.  No joint effusions. Neurologic:  Normal speech and language. No gross focal neurologic deficits are appreciated. Specifically extraocular movements are intact cranial nerves II through XII are intact except for visual fields were not checked. Cerebellar finger to nose is normal bilaterally her strength is 5 over 5 throughout sensation is intact throughout. No gait instability. Skin:  Skin is warm, dry and intact. No rash noted.  ____________________________________________  LABS (all labs ordered are listed, but only abnormal results are displayed)  Labs Reviewed  GLUCOSE, CAPILLARY - Abnormal; Notable for the following:       Result Value   Glucose-Capillary 100 (*)    All other components within normal limits  COMPREHENSIVE METABOLIC PANEL - Abnormal; Notable for the following:    Glucose, Bld 101 (*)    Alkaline Phosphatase 37 (*)    All other components within normal limits  CBC WITH DIFFERENTIAL/PLATELET - Abnormal; Notable for the following:    WBC 3.3 (*)    RDW 15.4 (*)    All other components within normal limits  URINALYSIS, COMPLETE (UACMP) WITH MICROSCOPIC - Abnormal; Notable for the following:    Color, Urine STRAW (*)    APPearance CLEAR (*)    Squamous Epithelial / LPF 0-5 (*)    All other components within normal limits  TROPONIN I   ____________________________________________  EKG EKG read and interpreted by me shows sinus bradycardia rate of 57 normal axis. Her reads borderline prolonged PR interval of 202 ms otherwise EKG looks essentially  normal. ____________________________________________  RADIOLOGY Study Result   CLINICAL DATA:  Weakness, dizziness  EXAM: PORTABLE CHEST 1 VIEW  COMPARISON:  None.  FINDINGS: Borderline cardiomegaly. No infiltrate or pleural effusion. No pulmonary edema. Mild elevation of the right hemidiaphragm.  IMPRESSION: No active disease.  Borderline cardiomegaly.   Electronically Signed   By: Lahoma Crocker M.D.   On: 10/04/2016 12:39    Study Result   CLINICAL DATA:  Hypertension, headaches  EXAM: CT HEAD WITHOUT CONTRAST  TECHNIQUE: Contiguous axial images were obtained from the base of the skull through the vertex without intravenous contrast.  COMPARISON:  None.  FINDINGS: Brain: No evidence of acute infarction, hemorrhage, hydrocephalus, extra-axial collection or mass lesion/mass effect.  Vascular: No hyperdense vessel or unexpected calcification.  Skull: No osseous abnormality.  Sinuses/Orbits: Visualized paranasal sinuses are clear. Visualized mastoid sinuses are clear. Visualized orbits demonstrate no focal abnormality.  Other: None  IMPRESSION: No acute intracranial pathology.   Electronically Signed   By: Kathreen Devoid   On: 10/04/2016 13:09     ____________________________________________   PROCEDURES  Procedure(s) performed:   Procedures  Critical Care performed:  ____________________________________________   INITIAL IMPRESSION / ASSESSMENT AND PLAN / ED COURSE  Pertinent labs & imaging results that were available during my care of the patient were reviewed by me and considered in my medical decision making (see chart for details).       ____________________________________________   FINAL CLINICAL IMPRESSION(S) / ED DIAGNOSES  Final diagnoses:  Viral syndrome      NEW MEDICATIONS STARTED DURING THIS VISIT:  New Prescriptions   No medications on file     Note:  This document was prepared using  Dragon voice recognition software and may include unintentional dictation errors.    Nena Polio, MD 10/04/16 [phone]

## 2016-10-04 NOTE — Telephone Encounter (Signed)
Called patient back per verbal orders per Dr [name] no avaliability on her schedule advised to go to ED for evaluation .  Patient verbalized an understanding .  She will go to ED for work up.

## 2016-10-04 NOTE — Discharge Instructions (Signed)
The tests that we've done today all looked okay except for that there is a suggestion of a viral illness going on. I would rest in bed and take it easy for another day or to drink plenty of fluids follow up with your regular doctor. Please return here if you feel sicker at all. Oropharynx better and another 3 or 4 days.

## 2016-10-04 NOTE — Telephone Encounter (Signed)
Reason for call; dizzy, lightheaded weak, just doesn't feel stable  Symptoms: dizzy today , heaviness over body, no chest pain , weak, squeezing sensation in head feels this way when blood sugar  drops, no arm numbness Non fasting blood sugar on Monday 92 checked at [employer] at work, eating throughout day consistently, was cutting back  carbohydrates last week episode when hypoglycemic episode happened Tuesday last week,   Not monitoring blood sugar at home Duration 1 week , symptoms are becoming more consistent  Medications: Last seen for this problem:09/03/15 Seen by: [place] Dr Lacinda Axon would really like for her to see Dr [name] if there is there is any way.  .  Please advise.

## 2016-10-06 ENCOUNTER — Ambulatory Visit (INDEPENDENT_AMBULATORY_CARE_PROVIDER_SITE_OTHER): Payer: No Typology Code available for payment source | Admitting: Internal Medicine

## 2016-10-06 ENCOUNTER — Encounter: Payer: Self-pay | Admitting: Internal Medicine

## 2016-10-06 VITALS — BP 138/78 | HR 61 | Temp 97.7°F | Ht 64.0 in | Wt 194.2 lb

## 2016-10-06 DIAGNOSIS — R55 Syncope and collapse: Secondary | ICD-10-CM

## 2016-10-06 DIAGNOSIS — F419 Anxiety disorder, unspecified: Secondary | ICD-10-CM

## 2016-10-06 DIAGNOSIS — H6993 Unspecified Eustachian tube disorder, bilateral: Secondary | ICD-10-CM

## 2016-10-06 DIAGNOSIS — R42 Dizziness and giddiness: Secondary | ICD-10-CM

## 2016-10-06 LAB — POCT GLYCOSYLATED HEMOGLOBIN (HGB A1C): HEMOGLOBIN A1C: 5.5

## 2016-10-06 MED ORDER — FLUTICASONE PROPIONATE 50 MCG/ACT NA SUSP: 2.0000 | Freq: Every day | NASAL | 6 refills | Status: DC

## 2016-10-06 MED ORDER — FEXOFENADINE HCL 180 MG PO TABS: 180.0000 mg | ORAL_TABLET | Freq: Every day | ORAL | 0 refills | Status: DC

## 2016-10-06 MED ORDER — HYDROCHLOROTHIAZIDE 25 MG PO TABS: 12.5000 mg | ORAL_TABLET | Freq: Every day | ORAL | 3 refills | Status: DC

## 2016-10-06 NOTE — Patient Instructions (Addendum)
I would like you to check your blood sugars to rule out  true  hypoglycemia .  Check them when you have symptoms,  Or when you are fasting    I would try Flonase and allegra for Eustachia tube dysfunction that may be causing your light headedness    There are plenty of high protein low carb cookies that will not cause your blood sugars to go up but will keep them steady ,   Look for them in the diet section  where the protein shakes are  Sold.   All of these have 5 g sugar or less : Power crunch Atkins bars KIND :thE  "low glycemic index"  variety QUE[employer] : (taste better after being microwaved OUT OF THE WRAPPER)

## 2016-10-06 NOTE — Progress Notes (Signed)
Subjective:  Patient ID: Nicole Gould, female    DOB: 17-Mar-1961  Age: 56 y.o. MRN: [id number]  CC: The primary encounter diagnosis was Eustachian tube disorder, bilateral. Diagnoses of Dizziness, Postural dizziness with presyncope, and Anxiety were also pertinent to this visit.  HPI Nicole Gould presents for follow up on dizziness.   Patient was recently evaluated in the ER for persistent symptos of presyncope and light headedness .  Workup included a 12 lead EKG, head CT ,  And labs  She was treated with IV fluids and sent hoe with diagnosis of viral syndrome  "Ive had so many things happen to me over the past 2 months"   Reports a remote diagnosis of hypoglycemia which occurred over 10 years ago with a drop in blood sugar to 40.  Was hospitalized  For several days.  Diagnosis unclear,  Has never seen an endocrinologist. Does not check blood sugars,   .         Reports that the episode that occurred 10 days ago did so in the setting of mild Carbohydrate restriction for the past several several weeks in an effort to lose weight.    Reports that she had to pull over in her car due to a feeling of presyncope ,  Feeling jittery, woozy , occurred while eating a salad,  Improved  After drinking orange juice. Has been feeling light headed and dizzy  Since then  On the day of the incident she left work early . Returned to work the following day.  Had her blood sugar checked at work the following  Day and BS was 92. Did not check bs until seen in ER on Wednesday . As given IV fluids, and this made her feel better.  "My body is off."  Has been having daily episodes of feeling off balance  Treated for strep pharyngitis by Urgent Care in February  At [facility]. Took Amoxicillin .   Forgot to continue her probiotic ,  Now seeing GYN for persistent  Recurrent vaginitis  Secondary  to candida  For the past month.  has used  diflucan repeated dose,  Now using nystatin and   Hydrocortisone.  Has been  taking an oral probiotic for the last month.    Reports having an Intermittent Watery discharge that occurs only at night and early morning for the past year  . No odor or itching from the discharge, GYN has been referred to urogynecology for rule out urinary leak (fistula? since she  has had a  D&C In the past      Lab Results  Component Value Date   HGBA1C 5.5 10/06/2016     Outpatient Medications Prior to Visit  Medication Sig Dispense Refill  . ALPRAZolam (XANAX) 0.5 MG tablet Take 1 tablet (0.5 mg total) by mouth 2 (two) times daily as needed for anxiety. 20 tablet 0  . BIOTIN FORTE PO Take by mouth daily.    . cetirizine (ZYRTEC) 5 MG tablet Take 1 tablet (5 mg total) by mouth daily. 30 tablet 3  . Cholecalciferol (VITAMIN D-3) 1000 UNITS CAPS Take 1 capsule by mouth daily.    Marland Kitchen MAGNESIUM MALATE PO Take 1 tablet by mouth daily.     . Multiple Vitamins-Minerals (MULTIVITAMIN WITH MINERALS) tablet Take 1 tablet by mouth daily.    . nebivolol (BYSTOLIC) 10 MG tablet TAKE 1 TABLET (10 MG TOTAL) BY MOUTH DAILY. 90 tablet 3  . nystatin cream (MYCOSTATIN) Apply 1 application topically 2 (  two) times daily. 30 g 0  . Omega-3 Fatty Acids (FISH OIL) 1200 MG CAPS Take 1,200 mg by mouth daily.     . pantoprazole (PROTONIX) 40 MG tablet TAKE 1 TABLET 30 MINUTES BEFORE BREAKFAST 90 tablet 0  . hydrochlorothiazide (HYDRODIURIL) 25 MG tablet Take 0.5 tablets (12.5 mg total) by mouth daily. 90 tablet 3   Facility-Administered Medications Prior to Visit  Medication Dose Route Frequency Provider Last Rate Last Dose  . 0.9 %  sodium chloride infusion  500 mL Intravenous Continuous Jerene Bears, MD        Review of Systems;  Patient denies headache, fevers, malaise, unintentional weight loss, skin rash, eye pain, sinus congestion and sinus pain, sore throat, dysphagia,  hemoptysis , cough, dyspnea, wheezing, chest pain, palpitations, orthopnea, edema, abdominal pain, nausea, melena, diarrhea,  constipation, flank pain, dysuria, hematuria, urinary  Frequency, nocturia, numbness, tingling, seizures,  Focal weakness, Loss of consciousness,  Tremor, insomnia, depression, anxiety, and suicidal ideation.      Objective:  BP 138/78   Pulse 61   Temp 97.7 F (36.5 C) (Oral)   Ht 5\' 4"  (1.626 m)   Wt 194 lb 3.2 oz (88.1 kg)   LMP 12/01/2013 (Approximate)   SpO2 96%   BMI 33.33 kg/m   BP Readings from Last 3 Encounters:  10/06/16 138/78  10/04/16 (!) 164/79  08/28/16 (!) 142/70    Wt Readings from Last 3 Encounters:  10/06/16 194 lb 3.2 oz (88.1 kg)  10/04/16 196 lb (88.9 kg)  08/28/16 196 lb (88.9 kg)    General appearance: alert, cooperative and appears stated age Ears: normal TM's and external ear canals both ears Throat: lips, mucosa, and tongue normal; teeth and gums normal Neck: no adenopathy, no carotid bruit, supple, symmetrical, trachea midline and thyroid not enlarged, symmetric, no tenderness/mass/nodules Back: symmetric, no curvature. ROM normal. No CVA tenderness. Lungs: clear to auscultation bilaterally Heart: regular rate and rhythm, S1, S2 normal, no murmur, click, rub or gallop Abdomen: soft, non-tender; bowel sounds normal; no masses,  no organomegaly Pulses: 2+ and symmetric Skin: Skin color, texture, turgor normal. No rashes or lesions Lymph nodes: Cervical, supraclavicular, and axillary nodes normal.  Lab Results  Component Value Date   HGBA1C 5.5 10/06/2016   HGBA1C 5.5 08/27/2015    Lab Results  Component Value Date   CREATININE 0.70 10/04/2016   CREATININE 0.73 08/27/2015   CREATININE 0.80 09/16/2014    Lab Results  Component Value Date   WBC 3.3 (L) 10/04/2016   HGB 12.9 10/04/2016   HCT 39.6 10/04/2016   PLT 175 10/04/2016   GLUCOSE 101 (H) 10/04/2016   CHOL 175 08/27/2015   TRIG 55.0 08/27/2015   HDL 55.90 08/27/2015   LDLDIRECT 113.0 09/16/2014   LDLCALC 108 (H) 08/27/2015   ALT 18 10/04/2016   AST 27 10/04/2016   NA 141  10/04/2016   K 3.8 10/04/2016   CL 109 10/04/2016   CREATININE 0.70 10/04/2016   BUN 15 10/04/2016   CO2 25 10/04/2016   TSH 0.92 08/27/2015   HGBA1C 5.5 10/06/2016    Ct Head Wo Contrast  Result Date: 10/04/2016 CLINICAL DATA:  Hypertension, headaches EXAM: CT HEAD WITHOUT CONTRAST TECHNIQUE: Contiguous axial images were obtained from the base of the skull through the vertex without intravenous contrast. COMPARISON:  None. FINDINGS: Brain: No evidence of acute infarction, hemorrhage, hydrocephalus, extra-axial collection or mass lesion/mass effect. Vascular: No hyperdense vessel or unexpected calcification. Skull: No osseous abnormality. Sinuses/Orbits: Visualized  paranasal sinuses are clear. Visualized mastoid sinuses are clear. Visualized orbits demonstrate no focal abnormality. Other: None IMPRESSION: No acute intracranial pathology. Electronically Signed   By: Kathreen Devoid   On: 10/04/2016 13:09     Assessment & Plan:   Problem List Items Addressed This Visit    Anxiety    Spent  15 of 25 minute visit discussing her worsening anxiety , which seems to be ampliifying her somatic symptoms       Postural dizziness with presyncope    Etiology likely viral as she has not evidence of arrhythmia, diabetes ,  Prediabetes, or dehydration       Relevant Medications   hydrochlorothiazide (HYDRODIURIL) 25 MG tablet    Other Visit Diagnoses    Eustachian tube disorder, bilateral    -  Primary   Dizziness       Relevant Orders   POCT HgB A1C (Completed)     A total of 25 minutes was spent with patient more than half of which was spent in counseling patient on the above mentioned issues , reviewing and explaining recent labs and imaging studies done, and coordination of care. I am having Ms. Dondero start on fexofenadine and fluticasone. I am also having her maintain her multivitamin with minerals, Vitamin D-3, Fish Oil, nebivolol, MAGNESIUM MALATE PO, BIOTIN FORTE PO, ALPRAZolam,  nystatin cream, cetirizine, pantoprazole, and hydrochlorothiazide. We will continue to administer sodium chloride.  Meds ordered this encounter  Medications  . fexofenadine (ALLEGRA) 180 MG tablet    Sig: Take 1 tablet (180 mg total) by mouth daily.    Dispense:  30 tablet    Refill:  0  . fluticasone (FLONASE) 50 MCG/ACT nasal spray    Sig: Place 2 sprays into both nostrils daily.    Dispense:  16 g    Refill:  6  . hydrochlorothiazide (HYDRODIURIL) 25 MG tablet    Sig: Take 0.5 tablets (12.5 mg total) by mouth daily.    Dispense:  90 tablet    Refill:  3    Medications Discontinued During This Encounter  Medication Reason  . hydrochlorothiazide (HYDRODIURIL) 25 MG tablet Reorder    Follow-up: No Follow-up on file.   [name], MD

## 2016-10-06 NOTE — Progress Notes (Signed)
Pre visit review using our clinic review tool, if applicable. No additional management support is needed unless otherwise documented below in the visit note. 

## 2016-10-08 ENCOUNTER — Emergency Department
Admission: EM | Admit: 2016-10-08 | Discharge: 2016-10-08 | Disposition: A | Discharge status: Home / Self Care | Payer: No Typology Code available for payment source | Attending: Emergency Medicine | Admitting: Emergency Medicine

## 2016-10-08 ENCOUNTER — Emergency Department: Payer: No Typology Code available for payment source

## 2016-10-08 DIAGNOSIS — I10 Essential (primary) hypertension: Secondary | ICD-10-CM | POA: Diagnosis not present

## 2016-10-08 DIAGNOSIS — Z79899 Other long term (current) drug therapy: Secondary | ICD-10-CM | POA: Insufficient documentation

## 2016-10-08 DIAGNOSIS — R42 Dizziness and giddiness: Secondary | ICD-10-CM | POA: Insufficient documentation

## 2016-10-08 DIAGNOSIS — R202 Paresthesia of skin: Secondary | ICD-10-CM | POA: Diagnosis not present

## 2016-10-08 DIAGNOSIS — R200 Anesthesia of skin: Secondary | ICD-10-CM | POA: Diagnosis present

## 2016-10-08 NOTE — Discharge Instructions (Signed)
Your MRI today was normal.

## 2016-10-08 NOTE — ED Provider Notes (Signed)
[facility] Emergency Department Provider Note  ____________________________________________  Time seen: Approximately 9:05 PM  I have reviewed the triage vital signs and the nursing notes.   HISTORY  Chief Complaint Numbness    HPI Nicole Gould is a 55 y.o. female who complains of intermittent dizziness for the past 4 days that is improving but persistent as well as intermittent paresthesias described as tingling in the left upper arm today. No weakness. No other affected extremities. No vision changes. No headache.  She was seen in the ED 4 days ago, had extensive workup which was negative. Total was a viral illness. She then saw her PCP 2 days ago who again reinforced that they think this is a viral illness. Overall the patient reports her symptoms are improving but she just worries that there unusual for her and persistent longer than she expected.  No fever chills or neck stiffness     Past Medical History:  Diagnosis Date  . Chicken pox   . Colon polyps   . Concussion   . Frequent headaches   . GERD (gastroesophageal reflux disease)   . Hypertension   . Migraines   . Pre-eclampsia    with grand mal seizures  . Seizure disorder in pregnancy ([place])   . Seizures ([place])    with pregnancy 1981- pre clampsia      Patient Active Problem List   Diagnosis Date Noted  . Postural dizziness with presyncope 10/08/2016  . Vaginal yeast infection 07/12/2016  . Acute seasonal allergic rhinitis 07/12/2016  . 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 Menopause present, declines hormone replacement therapy 10/25/2014  . Insomnia 05/05/2014  . Anxiety 05/05/2014  . Chronic venous insufficiency 04/21/2014  . Adenomatous polyp of colon 05/12/2013  . Visit for preventive health examination 03/14/2013  . Pain 03/11/2013  . Essential hypertension, benign 01/28/2013  . Palpitations 01/28/2013  . 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 COLONOSCOPY    . DILATION AND CURETTAGE OF UTERUS  09/08/2014   with hystereoscopy  . POLYPECTOMY    . TONSILLECTOMY    . TUBAL LIGATION       Prior to Admission medications   Medication Sig Start Date End Date Taking? Authorizing Provider  ALPRAZolam Duanne Moron) 0.5 MG tablet Take 1 tablet (0.5 mg total) by mouth 2 (two) times daily as needed for anxiety. 05/25/16   [name], MD  BIOTIN FORTE PO Take by mouth daily.    Historical Provider, MD  cetirizine (ZYRTEC) 5 MG tablet Take 1 tablet (5 mg total) by mouth daily. 07/12/16   Burnard Hawthorne, FNP  Cholecalciferol (VITAMIN D-3) 1000 UNITS CAPS Take 1 capsule by mouth daily.    Historical Provider, MD  fexofenadine (ALLEGRA) 180 MG tablet Take 1 tablet (180 mg total) by mouth daily. 10/06/16   [name], MD  fluticasone (FLONASE) 50 MCG/ACT nasal spray Place 2 sprays into both nostrils daily. 10/06/16   [name], MD  hydrochlorothiazide (HYDRODIURIL) 25 MG tablet Take 0.5 tablets (12.5 mg total) by mouth daily. 10/06/16   [name], MD  MAGNESIUM MALATE PO Take 1 tablet by mouth daily.     Historical Provider, MD  Multiple Vitamins-Minerals (MULTIVITAMIN WITH MINERALS) tablet Take 1 tablet by mouth daily.    Historical Provider, MD  nebivolol (BYSTOLIC) 10 MG tablet TAKE 1 TABLET (10 MG TOTAL) BY MOUTH DAILY. 11/23/15   Christia Reading  Gloriajean Dell, MD  nystatin cream (MYCOSTATIN) Apply 1 application topically 2 (two) times daily. 07/12/16   Burnard Hawthorne, FNP  Omega-3 Fatty Acids (FISH OIL) 1200 MG CAPS Take 1,200 mg by mouth daily.     Historical Provider, MD  pantoprazole (PROTONIX) 40 MG tablet TAKE 1 TABLET 30 MINUTES BEFORE BREAKFAST 08/25/16   [name], MD     Allergies Anesthetics, amide; Benadryl [diphenhydramine hcl (sleep)]; Fentanyl; and Losartan potassium-hctz   Family History  Problem Relation Age of Onset  . Hypertension Mother   . Diabetes Mother   . Heart  disease Father   . Kidney disease Father   . Hypertension Father   . Diabetes Father   . Cancer Maternal Grandfather     colon cancer  . Colon cancer Maternal Grandfather     passed age 57   . Colon polyps Neg Hx     Social History Social History  Substance Use Topics  . Smoking status: Never Smoker  . Smokeless tobacco: Never Used  . Alcohol use Yes     Comment: very rare - glass red wine when that     Review of Systems  Constitutional:   No fever or chills.  ENT:   No sore throat. No rhinorrhea. Cardiovascular:   No chest pain. Respiratory:   No dyspnea or cough. Gastrointestinal:   Negative for abdominal pain, vomiting and diarrhea.  Genitourinary:   Negative for dysuria or difficulty urinating. Musculoskeletal:   Negative for focal pain or swelling Neurological:   Negative for headaches. Positive dizziness and left upper arm tingling. 10-point ROS otherwise negative.  ____________________________________________   PHYSICAL EXAM:  VITAL SIGNS: ED Triage Vitals  Enc Vitals Group     BP 10/08/16 1644 (!) 154/67     Pulse Rate 10/08/16 1644 62     Resp 10/08/16 1644 14     Temp 10/08/16 1644 97.9 F (36.6 C)     Temp Source 10/08/16 1644 Oral     SpO2 10/08/16 1644 99 %     Weight 10/08/16 1645 188 lb (85.3 kg)     Height 10/08/16 1645 5\' 4"  (1.626 m)     Head Circumference --      Peak Flow --      Pain Score 10/08/16 1644 0     Pain Loc --      Pain Edu? --      Excl. in [place]? --     Vital signs reviewed, nursing assessments reviewed.   Constitutional:   Alert and oriented. Well appearing and in no distress. Eyes:   No scleral icterus. No conjunctival pallor. PERRL. EOMI.  No nystagmus. ENT   Head:   Normocephalic and atraumatic.   Nose:   No congestion/rhinnorhea. No septal hematoma   Mouth/Throat:   MMM, no pharyngeal erythema. No peritonsillar mass.    Neck:   No stridor. No SubQ emphysema. No  meningismus. Hematological/Lymphatic/Immunilogical:   No cervical lymphadenopathy. Cardiovascular:   RRR. Symmetric bilateral radial and DP pulses.  No murmurs.  Respiratory:   Normal respiratory effort without tachypnea nor retractions. Breath sounds are clear and equal bilaterally. No wheezes/rales/rhonchi. Gastrointestinal:   Soft and nontender. Non distended. There is no CVA tenderness.  No rebound, rigidity, or guarding. Genitourinary:   deferred Musculoskeletal:   Normal range of motion in all extremities. No joint effusions.  No lower extremity tenderness.  No edema. Neurologic:   Normal speech and language.  CN 2-10 normal. Motor grossly intact.  Normal finger-nose-finger. Normal gait. No pronator drift No gross focal neurologic deficits are appreciated.  Skin:    Skin is warm, dry and intact. No rash noted.  No petechiae, purpura, or bullae.  ____________________________________________    LABS (pertinent positives/negatives) (all labs ordered are listed, but only abnormal results are displayed) Labs Reviewed - No data to display ____________________________________________   EKG  Interpreted by me Sinus bradycardia rate of 56, right axis, normal intervals. Normal QRS ST segments and T waves.  ____________________________________________    [id number]  Mr Brain Wo Contrast  Result Date: 10/08/2016 CLINICAL DATA:  Dizziness with left upper extremity paresthesia EXAM: MRI HEAD WITHOUT CONTRAST TECHNIQUE: Multiplanar, multiecho pulse sequences of the brain and surrounding structures were obtained without intravenous contrast. COMPARISON:  Head CT 10/04/2016 FINDINGS: Brain: No focal diffusion restriction to indicate acute infarct. No intraparenchymal hemorrhage. Single focus of hyperintensity in the right parietal white matter. This finding is nonspecific and not uncommon in this age group, possibly related to chronic microvascular ischemia. No mass lesion or midline shift.  No hydrocephalus or extra-axial fluid collection. The midline structures are normal. No age advanced or lobar predominant atrophy. Vascular: Major intracranial arterial and venous sinus flow voids are preserved. No evidence of chronic microhemorrhage or amyloid angiopathy. Skull and upper cervical spine: The visualized skull base, calvarium, upper cervical spine and extracranial soft tissues are normal. Sinuses/Orbits: No fluid levels or advanced mucosal thickening. No mastoid effusion. Normal orbits. IMPRESSION: No acute intracranial abnormality or finding to explain the patient's dizziness. Electronically Signed   By: Ulyses Jarred M.D.   On: 10/08/2016 20:32    ____________________________________________   PROCEDURES Procedures  ____________________________________________   INITIAL IMPRESSION / ASSESSMENT AND PLAN / ED COURSE  Pertinent labs & imaging results that were available during my care of the patient were reviewed by me and considered in my medical decision making (see chart for details).    Clinical Course as of Oct 09 2103  Sun Oct 08, 2016  6160 Patient presents with unusual dizziness and unsteadiness which seems to be intermittent and inconsistent. Also associated paresthesia of the left upper extremity.Considering the patient's symptoms, medical history, and physical examination today, I have low suspicion for ischemic stroke, intracranial hemorrhage, meningitis, encephalitis, carotid or vertebral dissection, venous sinus thrombosis, MS, intracranial hypertension, glaucoma, CRAO, CRVO, or temporal arteritis. With persistent neurologic symptoms, we'll get an MRI of the brain. If negative, patient can be discharged to follow up with primary care, but if it does reveal a small stroke she'll need a workup for risk factor modification..  [PS]    Clinical Course User Index [PS] Carrie Mew, MD     ----------------------------------------- 9:09 PM on  10/08/2016 -----------------------------------------  MRI negative, essentially normal. We'll discharge home, continue symptomatically management, follow up with primary care.Considering the patient's symptoms, medical history, and physical examination today, I have low suspicion for ischemic stroke, intracranial hemorrhage, meningitis, encephalitis, carotid or vertebral dissection, venous sinus thrombosis, MS, intracranial hypertension, glaucoma, CRAO, CRVO, or temporal arteritis.  ____________________________________________   FINAL CLINICAL IMPRESSION(S) / ED DIAGNOSES  Final diagnoses:  Paresthesia  Dizziness      New Prescriptions   No medications on file     Portions of this note were generated with dragon dictation software. Dictation errors may occur despite best attempts at proofreading.    Carrie Mew, MD 10/08/16 2110

## 2016-10-08 NOTE — ED Notes (Signed)
Dr at bedside for assessment

## 2016-10-08 NOTE — ED Notes (Signed)
Pt states history of a concussion 3 years ago.  Pt states having ringing in ears and pressure feeling in head from the event even now.

## 2016-10-08 NOTE — ED Notes (Signed)
FIRST NURSE NOTE: Pt reports left arm numbness and tingling that comes and goes that started yesterday mid-day, here earlier in the week for dizziness and lightheadedness.  Pt ambulatory in lobby.

## 2016-10-08 NOTE — Assessment & Plan Note (Addendum)
Spent  15 of 25 minute visit discussing her worsening anxiety , which seems to be ampliifying her somatic symptoms

## 2016-10-08 NOTE — ED Notes (Signed)
Pt transported to MRI by [facility] EDT.

## 2016-10-08 NOTE — ED Triage Notes (Signed)
Pt c/o numbness in left arm starting yesterday. Reports being seen in ED on Wednesday for dizziness. PCP instructed to present to ED. Waxing and waning degrees of numbness.Marland Kitchen

## 2016-10-08 NOTE — Assessment & Plan Note (Signed)
Etiology likely viral as she has not evidence of arrhythmia, diabetes ,  Prediabetes, or dehydration

## 2016-11-07 ENCOUNTER — Ambulatory Visit (INDEPENDENT_AMBULATORY_CARE_PROVIDER_SITE_OTHER): Payer: No Typology Code available for payment source | Admitting: Family Medicine

## 2016-11-07 ENCOUNTER — Encounter: Payer: Self-pay | Admitting: Family Medicine

## 2016-11-07 DIAGNOSIS — J069 Acute upper respiratory infection, unspecified: Secondary | ICD-10-CM | POA: Diagnosis not present

## 2016-11-07 MED ORDER — CETIRIZINE HCL 5 MG PO TABS: 5.0000 mg | ORAL_TABLET | Freq: Every day | ORAL | 3 refills | Status: DC

## 2016-11-07 MED ORDER — BENZONATATE 200 MG PO CAPS: 200.0000 mg | ORAL_CAPSULE | Freq: Two times a day (BID) | ORAL | 0 refills | Status: DC | PRN

## 2016-11-07 NOTE — Progress Notes (Signed)
  Tommi Rumps, MD Phone: [phone]  Nicole Gould is a 56 y.o. female who presents today for same-day visit.  Patient notes onset of symptoms 3 days ago. Started with sore throat and some postnasal drip. Then developed nasal and sinus congestion. Notes her sinuses were significantly congested last night when laying down though have improved today. Noted some chills and no fevers. She does note some cough that is nonproductive. No shortness of breath. Mild discomfort when she does cough but no other chest pain. She's been using Flonase though not using Zyrtec.  She additionally notes that she wants her PCP to know that the left arm numbness that she had went away. She was evaluated in the emergency room multiple times for this and it was felt to be related to a viral illness. MRI was reassuring. She spoke with a sports medicine physician at her office that stated it was likely a viral illness. She notes no numbness or weakness over the last several weeks.   ROS see history of present illness  Objective  Physical Exam Vitals:   11/07/16 1549  BP: 140/80  Pulse: 66  Temp: 98.6 F (37 C)    BP Readings from Last 3 Encounters:  11/07/16 140/80  10/08/16 (!) 158/85  10/06/16 138/78   Wt Readings from Last 3 Encounters:  11/07/16 192 lb (87.1 kg)  10/08/16 188 lb (85.3 kg)  10/06/16 194 lb 3.2 oz (88.1 kg)    Physical Exam  Constitutional: She is well-developed, well-nourished, and in no distress.  HENT:  Head: Normocephalic and atraumatic.  Mild posterior oropharyngeal erythema, no exudates, normal TMs  Eyes: Conjunctivae are normal. Pupils are equal, round, and reactive to light.  Neck: Neck supple.  Cardiovascular: Normal rate, regular rhythm and normal heart sounds.   Pulmonary/Chest: Effort normal and breath sounds normal.  Musculoskeletal: She exhibits no edema.  Lymphadenopathy:    She has no cervical adenopathy.  Neurological: She is alert. Gait normal.  5  out of 5 strength bilateral biceps, triceps, and grip, sensation light touch intact bilateral upper extremities  Skin: Skin is warm and dry.     Assessment/Plan: Please see individual problem list.  Viral URI Symptoms most consistent with viral upper respiratory illness. No focal findings to indicate bacterial illness. We'll start on Zyrtec. She'll continue Flonase. Tessalon for cough. Given return precautions.   Prior paresthesias have resolved. She'll monitor for recurrence and seek medical attention if these do recur.  Meds ordered this encounter  Medications  . Nutritional Supplements (WOMENS HEALTH SUPPORT PO)    Sig: Take by mouth.  . benzonatate (TESSALON) 200 MG capsule    Sig: Take 1 capsule (200 mg total) by mouth 2 (two) times daily as needed for cough.    Dispense:  20 capsule    Refill:  0  . cetirizine (ZYRTEC) 5 MG tablet    Sig: Take 1 tablet (5 mg total) by mouth daily.    Dispense:  30 tablet    Refill:  [place], MD [place]

## 2016-11-07 NOTE — Patient Instructions (Signed)
Nice to meet you. You likely have a viral illness. We will treat you with Tessalon for cough. You should continue Flonase. We'll add zyrtec back. Try to stay well hydrated. You can take Tylenol or ibuprofen for any discomfort. If you develop shortness of breath, cough productive of blood, fevers, or any new or changing symptoms please seek medical attention immediately.

## 2016-11-07 NOTE — Assessment & Plan Note (Signed)
Symptoms most consistent with viral upper respiratory illness. No focal findings to indicate bacterial illness. We'll start on Zyrtec. She'll continue Flonase. Tessalon for cough. Given return precautions.

## 2016-11-24 ENCOUNTER — Telehealth: Payer: Self-pay | Admitting: Internal Medicine

## 2016-11-24 ENCOUNTER — Other Ambulatory Visit: Payer: Self-pay | Admitting: Internal Medicine

## 2016-11-24 NOTE — Telephone Encounter (Signed)
Pt is going to [place] and completely out of med

## 2016-11-27 NOTE — Telephone Encounter (Signed)
We have not seen Nicole Gould for any heartburn related symptoms since 2014, therefore we would not be able to refill any PPI's for her. However, it appears that Dr [name] has been prescribing pantoprazole for patient since that time. It also appears that Dr [name] refilled this patient's pantoprazole on 11/24/16.

## 2017-01-08 ENCOUNTER — Other Ambulatory Visit: Payer: Self-pay | Admitting: Cardiovascular Disease

## 2017-01-15 ENCOUNTER — Ambulatory Visit: Payer: Self-pay | Admitting: Family

## 2017-01-15 DIAGNOSIS — Z0289 Encounter for other administrative examinations: Secondary | ICD-10-CM

## 2017-01-18 ENCOUNTER — Encounter: Payer: Self-pay | Admitting: Internal Medicine

## 2017-01-18 ENCOUNTER — Ambulatory Visit (INDEPENDENT_AMBULATORY_CARE_PROVIDER_SITE_OTHER): Payer: No Typology Code available for payment source | Admitting: Internal Medicine

## 2017-01-18 DIAGNOSIS — G479 Sleep disorder, unspecified: Secondary | ICD-10-CM | POA: Diagnosis not present

## 2017-01-18 DIAGNOSIS — I872 Venous insufficiency (chronic) (peripheral): Secondary | ICD-10-CM | POA: Diagnosis not present

## 2017-01-18 DIAGNOSIS — F419 Anxiety disorder, unspecified: Secondary | ICD-10-CM | POA: Diagnosis not present

## 2017-01-18 DIAGNOSIS — R419 Unspecified symptoms and signs involving cognitive functions and awareness: Secondary | ICD-10-CM

## 2017-01-18 MED ORDER — ALPRAZOLAM 0.5 MG PO TABS: 0.5000 mg | ORAL_TABLET | Freq: Two times a day (BID) | ORAL | 0 refills | Status: DC | PRN

## 2017-01-18 NOTE — Progress Notes (Signed)
Subjective:  Patient ID: Nicole Gould, female    DOB: 11/17/1960  Age: 56 y.o. MRN: [id number]  CC: Diagnoses of Anxiety, Chronic venous insufficiency, and Sleep disorder with cognitive complaints were pertinent to this visit.  HPI [name] presents for FOLLOW UP ON MULTIPLE ISSUES)  chronc sinus drainage.  Seen 4 times since Jan 1 f of pharyngitis,  URI, Eustachian tube dysfunction  States that all syptoms have resolved since taking a trip to [place]    2) Cognitive issues:  Patient states that she believes she suffers from dyslexia and attention deficit ,but has been living with these disorders untreated and undiagnosed for "  a very long time."  Her concerns have been  Influenced by two women at work who have been diagnosed and treated with ritalin for ADD. recalls that the trouble started in elementary school.  Patient states that she has trouble reading because she cannot stay on the same line and frequently loses her place.  She otten omits  letters when writing,  and omits words when reading .  Had difficulty comprehending during school,  Finished "some college."    2) ankle edema  Improves after elevation of legs.  Trace pitting edema .  Was worse during travel.   3) Has mild pelvic pain I the morning that resolves withi an hour of waking,  Has an  overactive baldder     Outpatient Medications Prior to Visit  Medication Sig Dispense Refill  . BIOTIN FORTE PO Take by mouth daily.    Marland Kitchen BYSTOLIC 10 MG tablet TAKE 1 TABLET (10 MG TOTAL) BY MOUTH DAILY. 90 tablet 3  . Cholecalciferol (VITAMIN D-3) 1000 UNITS CAPS Take 1 capsule by mouth daily.    . hydrochlorothiazide (HYDRODIURIL) 25 MG tablet Take 0.5 tablets (12.5 mg total) by mouth daily. 90 tablet 3  . MAGNESIUM MALATE PO Take 1 tablet by mouth daily.     . Multiple Vitamins-Minerals (MULTIVITAMIN WITH MINERALS) tablet Take 1 tablet by mouth daily.    . Nutritional Supplements (WOMENS HEALTH SUPPORT PO) Take by mouth.     . Omega-3 Fatty Acids (FISH OIL) 1200 MG CAPS Take 1,200 mg by mouth daily.     . pantoprazole (PROTONIX) 40 MG tablet TAKE 1 TABLET 30 MINUTES BEFORE BREAKFAST 90 tablet 0  . ALPRAZolam (XANAX) 0.5 MG tablet Take 1 tablet (0.5 mg total) by mouth 2 (two) times daily as needed for anxiety. (Patient not taking: Reported on 01/18/2017) 20 tablet 0  . benzonatate (TESSALON) 200 MG capsule Take 1 capsule (200 mg total) by mouth 2 (two) times daily as needed for cough. (Patient not taking: Reported on 01/18/2017) 20 capsule 0  . cetirizine (ZYRTEC) 5 MG tablet Take 1 tablet (5 mg total) by mouth daily. (Patient not taking: Reported on 01/18/2017) 30 tablet 3  . fexofenadine (ALLEGRA) 180 MG tablet Take 1 tablet (180 mg total) by mouth daily. (Patient not taking: Reported on 01/18/2017) 30 tablet 0  . fluticasone (FLONASE) 50 MCG/ACT nasal spray Place 2 sprays into both nostrils daily. (Patient not taking: Reported on 01/18/2017) 16 g 6  . nystatin cream (MYCOSTATIN) Apply 1 application topically 2 (two) times daily. (Patient not taking: Reported on 01/18/2017) 30 g 0   Facility-Administered Medications Prior to Visit  Medication Dose Route Frequency Provider Last Rate Last Dose  . 0.9 %  sodium chloride infusion  500 mL Intravenous Continuous Pyrtle, Lajuan Lines, MD        Review of Systems;  Patient denies headache, fevers, malaise, unintentional weight loss, skin rash, eye pain, sinus congestion and sinus pain, sore throat, dysphagia,  hemoptysis , cough, dyspnea, wheezing, chest pain, palpitations, orthopnea, edema, abdominal pain, nausea, melena, diarrhea, constipation, flank pain, dysuria, hematuria, urinary  Frequency, nocturia, numbness, tingling, seizures,  Focal weakness, Loss of consciousness,  Tremor, insomnia, depression, anxiety, and suicidal ideation.      Objective:  BP 134/88 (BP Location: Left Arm, Patient Position: Sitting, Cuff Size: Normal)   Pulse 81   Temp 98.4 F (36.9 C) (Oral)   Resp 16    Ht 5\' 4"  (1.626 m)   Wt 192 lb (87.1 kg)   LMP 12/01/2013 (Approximate)   SpO2 100%   BMI 32.96 kg/m   BP Readings from Last 3 Encounters:  01/18/17 134/88  11/07/16 140/80  10/08/16 (!) 158/85    Wt Readings from Last 3 Encounters:  01/18/17 192 lb (87.1 kg)  11/07/16 192 lb (87.1 kg)  10/08/16 188 lb (85.3 kg)    General appearance: alert, cooperative and appears stated age Ears: normal TM's and external ear canals both ears Throat: lips, mucosa, and tongue normal; teeth and gums normal Neck: no adenopathy, no carotid bruit, supple, symmetrical, trachea midline and thyroid not enlarged, symmetric, no tenderness/mass/nodules Back: symmetric, no curvature. ROM normal. No CVA tenderness. Lungs: clear to auscultation bilaterally Heart: regular rate and rhythm, S1, S2 normal, no murmur, click, rub or gallop Abdomen: soft, non-tender; bowel sounds normal; no masses,  no organomegaly Pulses: 2+ and symmetric Skin: Skin color, texture, turgor normal. No rashes or lesions Lymph nodes: Cervical, supraclavicular, and axillary nodes normal.  Lab Results  Component Value Date   HGBA1C 5.5 10/06/2016   HGBA1C 5.5 08/27/2015    Lab Results  Component Value Date   CREATININE 0.70 10/04/2016   CREATININE 0.73 08/27/2015   CREATININE 0.80 09/16/2014    Lab Results  Component Value Date   WBC 3.3 (L) 10/04/2016   HGB 12.9 10/04/2016   HCT 39.6 10/04/2016   PLT 175 10/04/2016   GLUCOSE 101 (H) 10/04/2016   CHOL 175 08/27/2015   TRIG 55.0 08/27/2015   HDL 55.90 08/27/2015   LDLDIRECT 113.0 09/16/2014   LDLCALC 108 (H) 08/27/2015   ALT 18 10/04/2016   AST 27 10/04/2016   NA 141 10/04/2016   K 3.8 10/04/2016   CL 109 10/04/2016   CREATININE 0.70 10/04/2016   BUN 15 10/04/2016   CO2 25 10/04/2016   TSH 0.92 08/27/2015   HGBA1C 5.5 10/06/2016    Mr Brain Wo Contrast  Result Date: 10/08/2016 CLINICAL DATA:  Dizziness with left upper extremity paresthesia EXAM: MRI HEAD  WITHOUT CONTRAST TECHNIQUE: Multiplanar, multiecho pulse sequences of the brain and surrounding structures were obtained without intravenous contrast. COMPARISON:  Head CT 10/04/2016 FINDINGS: Brain: No focal diffusion restriction to indicate acute infarct. No intraparenchymal hemorrhage. Single focus of hyperintensity in the right parietal white matter. This finding is nonspecific and not uncommon in this age group, possibly related to chronic microvascular ischemia. No mass lesion or midline shift. No hydrocephalus or extra-axial fluid collection. The midline structures are normal. No age advanced or lobar predominant atrophy. Vascular: Major intracranial arterial and venous sinus flow voids are preserved. No evidence of chronic microhemorrhage or amyloid angiopathy. Skull and upper cervical spine: The visualized skull base, calvarium, upper cervical spine and extracranial soft tissues are normal. Sinuses/Orbits: No fluid levels or advanced mucosal thickening. No mastoid effusion. Normal orbits. IMPRESSION: No acute intracranial abnormality or  finding to explain the patient's dizziness. Electronically Signed   By: Ulyses Jarred M.D.   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  Referral to Dr. Rainey Pines for formal testing recommended       Chronic venous insufficiency    Diagnosis explained. Advised to use compression stockings/knee highs during work hours to managed ankle edema.       Anxiety    After 25 minutes of face to face time discussing her multiple somatic complaints,  Irecommended that she make a separate appointment to discuss treatment of her anxiety , which seems to be amplifying her somatic symptoms . Alprazolam refilled for prn use The risks and benefits of benzodiazepine use were reviewed with patient today including  excessive sedation leading to respiratory depression,  impaired thinking/driving, and addiction.  Patient was advised to avoid concurrent use with alcohol, to use medication only as needed and not to share with others  .       Relevant Medications   ALPRAZolam (XANAX) 0.5 MG tablet      I have discontinued Ms. Bordelon's nystatin cream, fexofenadine, fluticasone, benzonatate, and cetirizine. I am also having her maintain her multivitamin with minerals, Vitamin D-3, Fish Oil, MAGNESIUM MALATE PO, BIOTIN FORTE PO, hydrochlorothiazide, Nutritional Supplements (WOMENS HEALTH SUPPORT PO), pantoprazole, BYSTOLIC, and ALPRAZolam. We will continue to administer sodium chloride.  Meds ordered this encounter  Medications  . ALPRAZolam (XANAX) 0.5 MG tablet    Sig: Take 1 tablet (0.5 mg total) by mouth 2 (two) times daily as needed for anxiety.    Dispense:  20 tablet    Refill:  0    Medications Discontinued During This Encounter  Medication Reason  . benzonatate (TESSALON) 200 MG capsule Patient has not taken in last 30 days  . cetirizine (ZYRTEC) 5 MG tablet Patient has not taken in last 30 days  . fexofenadine (ALLEGRA) 180 MG tablet Patient has not taken in last 30 days  . fluticasone (FLONASE) 50 MCG/ACT nasal spray Patient has not taken in last 30 days  . nystatin cream (MYCOSTATIN) Patient has not taken in last 30 days  . ALPRAZolam (XANAX) 0.5 MG tablet Reorder    Follow-up: Return in about 3 months (around 04/20/2017) for follow up anxiety,  edema .   [name], MD

## 2017-01-18 NOTE — Patient Instructions (Addendum)
I am referring you to Dr Rainey Pines for cognitive testing to determine if you have dyslexia and/or ADD  I have refilled your xanax for 'AS NEEDED" USE (NOT DAILY).  Daily anxiety is best treated with daily SSRI  theray (Zoloft, Paxil, Lexapro, Celexa).    Your ankle swelling may be due to venous insufficiency (extremely common) .    [email or web address]   For an excellent variety of compression stockings   Chronic Venous Insufficiency Chronic venous insufficiency, also called venous stasis, is a condition that prevents blood from being pumped effectively through the veins in your legs. Blood may no longer be pumped effectively from the legs back to the heart. This condition can range from mild to severe. With proper treatment, you should be able to continue with an active life. What are the causes? Chronic venous insufficiency occurs when the vein walls become stretched, weakened, or damaged, or when valves within the vein are damaged. Some common causes of this include:  High blood pressure inside the veins (venous hypertension).  Increased blood pressure in the leg veins from long periods of sitting or standing.  A blood clot that blocks blood flow in a vein (deep vein thrombosis, DVT).  Inflammation of a vein (phlebitis) that causes a blood clot to form.  Tumors in the pelvis that cause blood to back up.  What increases the risk? The following factors may make you more likely to develop this condition:  Having a family history of this condition.  Obesity.  Pregnancy.  Living without enough physical activity or exercise (sedentary lifestyle).  Smoking.  Having a job that requires long periods of standing or sitting in one place.  Being a certain age. Women in their 63s and 57s and men in their 9s are more likely to develop this condition.  What are the signs or symptoms? Symptoms of this condition include:  Veins that are enlarged, bulging, or twisted (varicose  veins).  Skin breakdown or ulcers.  Reddened or discolored skin on the front of the leg.  Brown, smooth, tight, and painful skin just above the ankle, usually on the inside of the leg (lipodermatosclerosis).  Swelling.  How is this diagnosed? This condition may be diagnosed based on:  Your medical history.  A physical exam.  Tests, such as: ? A procedure that creates an image of a blood vessel and nearby organs and provides information about blood flow through the blood vessel (duplex ultrasound). ? A procedure that tests blood flow (plethysmography). ? A procedure to look at the veins using X-ray and dye (venogram).  How is this treated? The goals of treatment are to help you return to an active life and to minimize pain or disability. Treatment depends on the severity of your condition, and it may include:  Wearing compression stockings. These can help relieve symptoms and help prevent your condition from getting worse. However, they do not cure the condition.  Sclerotherapy. This is a procedure involving an injection of a material that "dissolves" damaged veins.  Surgery. This may involve: ? Removing a diseased vein (vein stripping). ? Cutting off blood flow through the vein (laser ablation surgery). ? Repairing a valve.  Follow these instructions at home:  Wear compression stockings as told by your health care provider. These stockings help to prevent blood clots and reduce swelling in your legs.  Take over-the-counter and prescription medicines only as told by your health care provider.  Stay active by exercising, walking, or doing different activities. Ask your  health care provider what activities are safe for you and how much exercise you need.  Drink enough fluid to keep your urine clear or pale yellow.  Do not use any products that contain nicotine or tobacco, such as cigarettes and e-cigarettes. If you need help quitting, ask your health care provider.  Keep  all follow-up visits as told by your health care provider. This is important. Contact a health care provider if:  You have redness, swelling, or more pain in the affected area.  You see a red streak or line that extends up or down from the affected area.  You have skin breakdown or a loss of skin in the affected area, even if the breakdown is small.  You get an injury in the affected area. Get help right away if:  You get an injury and an open wound in the affected area.  You have severe pain that does not get better with medicine.  You have sudden numbness or weakness in the foot or ankle below the affected area, or you have trouble moving your foot or ankle.  You have a fever and you have worse or persistent symptoms.  You have chest pain.  You have shortness of breath. Summary  Chronic venous insufficiency, also called venous stasis, is a condition that prevents blood from being pumped effectively through the veins in your legs.  Chronic venous insufficiency occurs when the vein walls become stretched, weakened, or damaged, or when valves within the vein are damaged.  Treatment for this condition depends on how severe your condition is, and it may involve wearing compression stockings or having a procedure.  Make sure you stay active by exercising, walking, or doing different activities. Ask your health care provider what activities are safe for you and how much exercise you need. This information is not intended to replace advice given to you by your health care provider. Make sure you discuss any questions you have with your health care provider. Document Released: 10/09/2006 Document Revised: 04/24/2016 Document Reviewed: 04/24/2016 Elsevier Interactive Patient Education  2017 [employer].

## 2017-01-20 DIAGNOSIS — R419 Unspecified symptoms and signs involving cognitive functions and awareness: Secondary | ICD-10-CM

## 2017-01-20 DIAGNOSIS — G479 Sleep disorder, unspecified: Secondary | ICD-10-CM | POA: Insufficient documentation

## 2017-01-20 NOTE — Assessment & Plan Note (Signed)
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  Referral to Dr. Rainey Pines for formal testing recommended

## 2017-01-20 NOTE — Assessment & Plan Note (Signed)
Diagnosis explained. Advised to use compression stockings/knee highs during work hours to managed ankle edema.

## 2017-01-20 NOTE — Assessment & Plan Note (Addendum)
After 25 minutes of face to face time discussing her multiple somatic complaints,  Irecommended that she make a separate appointment to discuss treatment of her anxiety , which seems to be amplifying her somatic symptoms . Alprazolam refilled for prn use The risks and benefits of benzodiazepine use were reviewed with patient today including excessive sedation leading to respiratory depression,  impaired thinking/driving, and addiction.  Patient was advised to avoid concurrent use with alcohol, to use medication only as needed and not to share with others  .

## 2017-01-29 ENCOUNTER — Telehealth: Payer: Self-pay | Admitting: Internal Medicine

## 2017-01-29 NOTE — Telephone Encounter (Signed)
Patient reports reflux.  She is taking pantoprazole with little relief.  She will come and see Ellouise Newer, [place] on Friday 8/17.  She was offered an earlier appt but declined due to work. Patient instructed to maintain an anti-reflux diet. Advised to avoid caffeine, mint, citrus foods/juices, tomatoes,  chocolate, NSAIDS/ASA products.  Instructed not to eat within 2 hours of exercise or bed, multiple small meals are better than 3 large meals.  Need to take PPI 30 minutes prior to 1st meal of the day.

## 2017-01-29 NOTE — Telephone Encounter (Signed)
Left message for patient to call back  

## 2017-01-31 ENCOUNTER — Ambulatory Visit: Payer: Self-pay | Admitting: Physician Assistant

## 2017-02-02 ENCOUNTER — Ambulatory Visit: Payer: Self-pay | Admitting: Physician Assistant

## 2017-02-09 NOTE — Telephone Encounter (Signed)
error 

## 2017-02-16 ENCOUNTER — Ambulatory Visit: Payer: Self-pay | Admitting: Physician Assistant

## 2017-02-23 ENCOUNTER — Ambulatory Visit: Payer: Self-pay | Admitting: Internal Medicine

## 2017-03-01 ENCOUNTER — Other Ambulatory Visit: Payer: Self-pay | Admitting: Internal Medicine

## 2017-03-01 NOTE — Telephone Encounter (Signed)
Pt called and is requesting this medication. Pt is just about out of medication and would this refilled for 90 days. Please call pt when completed.  Pharmacy - [id number] [address]  Call pt @ [phone] (leave msg)

## 2017-03-01 NOTE — Telephone Encounter (Signed)
Refilled

## 2017-03-05 ENCOUNTER — Ambulatory Visit: Payer: Self-pay | Admitting: Physician Assistant

## 2017-03-09 ENCOUNTER — Ambulatory Visit (INDEPENDENT_AMBULATORY_CARE_PROVIDER_SITE_OTHER): Payer: No Typology Code available for payment source | Admitting: Physician Assistant

## 2017-03-09 ENCOUNTER — Encounter: Payer: Self-pay | Admitting: Physician Assistant

## 2017-03-09 VITALS — BP 118/60 | HR 74 | Ht 64.0 in | Wt 192.0 lb

## 2017-03-09 DIAGNOSIS — K219 Gastro-esophageal reflux disease without esophagitis: Secondary | ICD-10-CM

## 2017-03-09 MED ORDER — PANTOPRAZOLE SODIUM 40 MG PO TBEC: 40.0000 mg | DELAYED_RELEASE_TABLET | Freq: Every day | ORAL | 3 refills | Status: DC

## 2017-03-09 NOTE — Patient Instructions (Signed)
We have sent the following medications to your pharmacy for you to pick up at your convenience: Pantoprazole 40 mg daily 30-60 mins before breakfast

## 2017-03-09 NOTE — Progress Notes (Addendum)
Chief Complaint: GERD  HPI:  [name] is a 56 year old African-American female with a past medical history of GERD as well as others listed below,  who was referred to me by Nicole Mc, MD for a complaint of reflux .      Please recall patient regularly follows with Dr. Hilarie Gould. She was most recently seen for a screening colonoscopy 08/28/16. Prior to this patient had an upper endoscopy 05/21/13 which showed possible eosinophilic esophagitis in the middle and lower third of the esophagus, gastritis in the antrum and duodenitis. Pathology showed GERD.    Today, the patient presents to clinic and explains that about a month or so ago her husband cooked something and put chili pepper in it and for 3 weeks afterwards, she had constant "fire" in her epigastrium and reflux symptoms. She was "miserable". Patient notes that she would have to sit up at night due to reflux symptoms. She called our clinic and was told to take Gaviscon on top of her regular Pantoprazole 40 mg and her symptoms have been gone for 3 weeks. Patient has had no trouble. No breakthrough reflux, no dysphagia or other symptoms.   Patient denies fever, chills, blood in her stool, melena, weight loss, fatigue, anorexia, nausea, vomiting or symptoms that awaken her at night.  Past Medical History:  Diagnosis Date  . Chicken pox   . Colon polyps   . Concussion   . Frequent headaches   . GERD (gastroesophageal reflux disease)   . Hypertension   . Migraines   . Pre-eclampsia    with grand mal seizures  . Seizure disorder in pregnancy ([place])   . Seizures ([place])    with pregnancy 1981- pre clampsia     Past Surgical History:  Procedure Laterality Date  . COLONOSCOPY    . DILATION AND CURETTAGE OF UTERUS  09/08/2014   with hystereoscopy  . POLYPECTOMY    . TONSILLECTOMY    . TUBAL LIGATION      Current Outpatient Prescriptions  Medication Sig Dispense Refill  . ALPRAZolam (XANAX) 0.5 MG tablet Take 1 tablet (0.5 mg  total) by mouth 2 (two) times daily as needed for anxiety. 20 tablet 0  . BIOTIN FORTE PO Take by mouth daily.    Marland Kitchen BYSTOLIC 10 MG tablet TAKE 1 TABLET (10 MG TOTAL) BY MOUTH DAILY. 90 tablet 3  . Cholecalciferol (VITAMIN D-3) 1000 UNITS CAPS Take 1 capsule by mouth daily.    . hydrochlorothiazide (HYDRODIURIL) 25 MG tablet Take 0.5 tablets (12.5 mg total) by mouth daily. 90 tablet 3  . MAGNESIUM MALATE PO Take 1 tablet by mouth daily.     . Multiple Vitamins-Minerals (MULTIVITAMIN WITH MINERALS) tablet Take 1 tablet by mouth daily.    . Nutritional Supplements (WOMENS HEALTH SUPPORT PO) Take by mouth.    . Omega-3 Fatty Acids (FISH OIL) 1200 MG CAPS Take 1,200 mg by mouth daily.     . pantoprazole (PROTONIX) 40 MG tablet TAKE 1 TABLET 30 MINUTES BEFORE BREAKFAST 90 tablet 0  . pantoprazole (PROTONIX) 40 MG tablet Take 1 tablet (40 mg total) by mouth daily before breakfast. 90 tablet 3   Current Facility-Administered Medications  Medication Dose Route Frequency Provider Last Rate Last Dose  . 0.9 %  sodium chloride infusion  500 mL Intravenous Continuous Pyrtle, Nicole Lines, MD        Allergies as of 03/09/2017 - Review Complete 03/09/2017  Allergen Reaction Noted  . Anesthetics, amide Nausea And Vomiting  03/31/2014  . Benadryl [diphenhydramine hcl (sleep)]  05/03/2016  . Fentanyl Hypertension 05/03/2016  . Losartan potassium-hctz  04/30/2014  . Prilocaine Nausea And Vomiting 03/31/2014    Family History  Problem Relation Age of Onset  . Hypertension Mother   . Diabetes Mother   . Heart disease Father   . Kidney disease Father   . Hypertension Father   . Diabetes Father   . Cancer Maternal Grandfather        colon cancer  . Colon cancer Maternal Grandfather        passed age 10   . Colon polyps Neg Hx     Social History   Social History  . Marital status: Married    Spouse name: N/A  . Number of children: N/A  . Years of education: N/A   Occupational History  . Not on  file.   Social History Main Topics  . Smoking status: Never Smoker  . Smokeless tobacco: Never Used  . Alcohol use Yes     Comment: very rare - glass red wine when that   . Drug use: No  . Sexual activity: Yes    Partners: Male   Other Topics Concern  . Not on file   Social History Narrative  . No narrative on file    Review of Systems:    Constitutional: No weight loss, fever or chills Cardiovascular: No chest pain   Respiratory: No SOB  Gastrointestinal: See HPI and otherwise negative   Physical Exam:  Vital signs: BP 118/60   Pulse 74   Ht 5\' 4"  (1.626 m)   Wt 192 lb (87.1 kg)   LMP 12/01/2013 (Approximate)   BMI 32.96 kg/m   Constitutional:   Pleasant AA female appears to be in NAD, Well developed, Well nourished, alert and cooperative Respiratory: Respirations even and unlabored. Lungs clear to auscultation bilaterally.   No wheezes, crackles, or rhonchi.  Cardiovascular: Normal S1, S2. No MRG. Regular rate and rhythm. No peripheral edema, cyanosis or pallor.  Gastrointestinal:  Soft, nondistended, nontender. No rebound or guarding. Normal bowel sounds. No appreciable masses or hepatomegaly. Psychiatric: Demonstrates good judgement and reason without abnormal affect or behaviors.  MOST RECENT LABS AND IMAGING: CBC    Component Value Date/Time   WBC 3.3 (L) 10/04/2016 1132   RBC 4.30 10/04/2016 1132   HGB 12.9 10/04/2016 1132   HGB 13.7 09/08/2014 2059   HCT 39.6 10/04/2016 1132   HCT 41.5 09/08/2014 2059   PLT 175 10/04/2016 1132   PLT 168 09/08/2014 2059   MCV 92.0 10/04/2016 1132   MCV 92 09/08/2014 2059   MCH 30.1 10/04/2016 1132   MCHC 32.7 10/04/2016 1132   RDW 15.4 (H) 10/04/2016 1132   RDW 14.3 09/08/2014 2059   LYMPHSABS 1.4 10/04/2016 1132   MONOABS 0.2 10/04/2016 1132   EOSABS 0.0 10/04/2016 1132   BASOSABS 0.0 10/04/2016 1132    CMP     Component Value Date/Time   NA 141 10/04/2016 1132   NA 143 09/08/2014 2059   K 3.8 10/04/2016  1132   K 3.8 09/08/2014 2059   CL 109 10/04/2016 1132   CL 108 09/08/2014 2059   CO2 25 10/04/2016 1132   CO2 26 09/08/2014 2059   GLUCOSE 101 (H) 10/04/2016 1132   GLUCOSE 94 09/08/2014 2059   BUN 15 10/04/2016 1132   BUN 14 09/08/2014 2059   CREATININE 0.70 10/04/2016 1132   CREATININE 0.72 09/08/2014 2059   CALCIUM 9.4 10/04/2016  1132   CALCIUM 9.4 09/08/2014 2059   PROT 7.7 10/04/2016 1132   PROT 7.1 09/08/2014 2059   ALBUMIN 4.1 10/04/2016 1132   ALBUMIN 4.1 09/08/2014 2059   AST 27 10/04/2016 1132   AST 34 09/08/2014 2059   ALT 18 10/04/2016 1132   ALT 45 09/08/2014 2059   ALKPHOS 37 (L) 10/04/2016 1132   ALKPHOS 43 09/08/2014 2059   BILITOT 0.9 10/04/2016 1132   BILITOT 0.5 09/08/2014 2059   GFRNONAA >60 10/04/2016 1132   GFRNONAA >60 09/08/2014 2059   GFRAA >60 10/04/2016 1132   GFRAA >60 09/08/2014 2059    Assessment: 1. GERD: 3 weeks of breakthrough symptoms after eating chili pepper, resolved over the past 3 weeks on Pantoprazole 40 mg once daily  Plan: 1. Continue patient on Pantoprazole 40 mg once daily. Provided her a 90 day prescription with refills for the year 2. Reviewed antireflux diet and lifestyle modifications 3. Discussed with patient that she will need to be seen at least once a year in order to have refills of her Pantoprazole in the future. She will follow with Dr. Hilarie Gould or myself as needed.  Ellouise Newer, PA-C [facility] 03/09/2017, 11:43 AM  Cc: Nicole Mc, MD   Addendum: Reviewed and agree with  management. Pyrtle, Nicole Lines, MD

## 2017-03-13 ENCOUNTER — Encounter: Payer: Self-pay | Admitting: Internal Medicine

## 2017-03-13 ENCOUNTER — Ambulatory Visit (INDEPENDENT_AMBULATORY_CARE_PROVIDER_SITE_OTHER): Payer: No Typology Code available for payment source | Admitting: Internal Medicine

## 2017-03-13 VITALS — BP 136/84 | HR 72 | Temp 98.1°F | Wt 195.0 lb

## 2017-03-13 DIAGNOSIS — B9789 Other viral agents as the cause of diseases classified elsewhere: Secondary | ICD-10-CM

## 2017-03-13 DIAGNOSIS — J329 Chronic sinusitis, unspecified: Secondary | ICD-10-CM | POA: Diagnosis not present

## 2017-03-13 MED ORDER — BENZONATATE 100 MG PO CAPS: 100.0000 mg | ORAL_CAPSULE | Freq: Three times a day (TID) | ORAL | 0 refills | Status: DC | PRN

## 2017-03-13 NOTE — Progress Notes (Signed)
HPI  Pt presents to the clinic today with c/o headache, facial pain and pressure, runny nose, nasal congestion and sore throat. This started 3-4 days ago. She is blowing clear/yellow mucous out of her nose. She denies difficulty swallowing. She denies fever but reports she has had chills and body aches. She has tried Coricidin, Emergent C and Saline nasal spray with minimal relief. She has no history of allergies. She has had sick contacts.  Review of Systems     Past Medical History:  Diagnosis Date  . Chicken pox   . Colon polyps   . Concussion   . Frequent headaches   . GERD (gastroesophageal reflux disease)   . Hypertension   . Migraines   . Pre-eclampsia    with grand mal seizures  . Seizure disorder in pregnancy ([place])   . Seizures ([place])    with pregnancy 1981- pre clampsia     Family History  Problem Relation Age of Onset  . Hypertension Mother   . Diabetes Mother   . Heart disease Father   . Kidney disease Father   . Hypertension Father   . Diabetes Father   . Cancer Maternal Grandfather        colon cancer  . Colon cancer Maternal Grandfather        passed age 7   . Colon polyps Neg Hx     Social History   Social History  . Marital status: Married    Spouse name: N/A  . Number of children: N/A  . Years of education: N/A   Occupational History  . Not on file.   Social History Main Topics  . Smoking status: Never Smoker  . Smokeless tobacco: Never Used  . Alcohol use Yes     Comment: very rare - glass red wine when that   . Drug use: No  . Sexual activity: Yes    Partners: Male   Other Topics Concern  . Not on file   Social History Narrative  . No narrative on file    Allergies  Allergen Reactions  . Anesthetics, Amide Nausea And Vomiting  . Benadryl [Diphenhydramine Hcl (Sleep)]     Made feel paralyzed when had benadryl with a cocktail for procedure  Decadron, bendayl, regaln combo at [facility] 2014- see care every where   . Fentanyl  Hypertension    Severe htn   . Losartan Potassium-Hctz     Dizziness,  Shortness of breath   . Prilocaine Nausea And Vomiting     Constitutional: Positive headache, fatigue. Denies fever or abrupt weight changes.  HEENT:  Positive facial pain, nasal congestion and sore throat. Denies eye redness, ear pain, ringing in the ears, wax buildup, runny nose or bloody nose. Respiratory: Denies cough, difficulty breathing or shortness of breath.  Cardiovascular: Denies chest pain, chest tightness, palpitations or swelling in the hands or feet.   No other specific complaints in a complete review of systems (except as listed in HPI above).  Objective:   BP 136/84   Pulse 72   Temp 98.1 F (36.7 C) (Oral)   Wt 195 lb (88.5 kg)   LMP 12/01/2013 (Approximate)   SpO2 98%   BMI 33.47 kg/m   General: Appears her stated age, in NAD. HEENT: Head: normal shape and size,no  sinus tenderness noted;  Ears: Tm's pink but intact, normal light reflex; Nose: mucosa boggy and moist, turbinates swollen; Throat/Mouth: + PND. Teeth present, mucosa erythematous and moist, no exudate noted, no lesions  or ulcerations noted.  Neck:  Bi;lateral anterior cervical adenopathy noted.  Pulmonary/Chest: Normal effort and positive vesicular breath sounds. No respiratory distress. No wheezes, rales or ronchi noted.       Assessment & Plan:   Viral Sinusitis  Can use a Neti Pot which can be purchased from your local drug store. Flonase 2 sprays each nostril for 3 days and then as needed. Start Zyrtec OTC Work note provided  RTC as needed or if symptoms persist. Webb Silversmith, NP

## 2017-03-13 NOTE — Patient Instructions (Signed)

## 2017-03-16 ENCOUNTER — Encounter: Payer: Self-pay | Admitting: Emergency Medicine

## 2017-03-16 DIAGNOSIS — R1010 Upper abdominal pain, unspecified: Secondary | ICD-10-CM | POA: Insufficient documentation

## 2017-03-16 DIAGNOSIS — Z79899 Other long term (current) drug therapy: Secondary | ICD-10-CM | POA: Diagnosis not present

## 2017-03-16 DIAGNOSIS — I10 Essential (primary) hypertension: Secondary | ICD-10-CM | POA: Insufficient documentation

## 2017-03-16 DIAGNOSIS — R112 Nausea with vomiting, unspecified: Secondary | ICD-10-CM | POA: Insufficient documentation

## 2017-03-16 LAB — COMPREHENSIVE METABOLIC PANEL
ALK PHOS: 46 U/L (ref 38–126)
ALT: 23 U/L (ref 14–54)
ANION GAP: 12 (ref 5–15)
AST: 27 U/L (ref 15–41)
Albumin: 4.2 g/dL (ref 3.5–5.0)
BUN: 16 mg/dL (ref 6–20)
CALCIUM: 10.1 mg/dL (ref 8.9–10.3)
CO2: 31 mmol/L (ref 22–32)
Chloride: 98 mmol/L — ABNORMAL LOW (ref 101–111)
Creatinine, Ser: 0.68 mg/dL (ref 0.44–1.00)
GFR calc non Af Amer: >60 mL/min (ref >60)
Glucose, Bld: 133 mg/dL — ABNORMAL HIGH (ref 65–99)
POTASSIUM: 3.2 mmol/L — AB (ref 3.5–5.1)
SODIUM: 141 mmol/L (ref 135–145)
TOTAL PROTEIN: 8.1 g/dL (ref 6.5–8.1)
Total Bilirubin: 0.7 mg/dL (ref 0.3–1.2)

## 2017-03-16 LAB — CBC
HCT: 40.8 % (ref 35.0–47.0)
HEMOGLOBIN: 14 g/dL (ref 12.0–16.0)
MCH: 31.1 pg (ref 26.0–34.0)
MCHC: 34.4 g/dL (ref 32.0–36.0)
MCV: 90.6 fL (ref 80.0–100.0)
Platelets: 178 10*3/uL (ref 150–440)
RBC: 4.51 MIL/uL (ref 3.80–5.20)
RDW: 14.1 % (ref 11.5–14.5)
WBC: 4.7 10*3/uL (ref 3.6–11.0)

## 2017-03-16 LAB — URINALYSIS, COMPLETE (UACMP) WITH MICROSCOPIC
Bilirubin Urine: NEGATIVE
GLUCOSE, UA: NEGATIVE mg/dL
Hgb urine dipstick: NEGATIVE
Ketones, ur: NEGATIVE mg/dL
Leukocytes, UA: NEGATIVE
NITRITE: NEGATIVE
PROTEIN: 100 mg/dL — AB
Specific Gravity, Urine: 1.017 (ref 1.005–1.030)
Squamous Epithelial / LPF: NONE SEEN
pH: 9 — ABNORMAL HIGH (ref 5.0–8.0)

## 2017-03-16 LAB — LIPASE, BLOOD: Lipase: 18 U/L (ref 11–51)

## 2017-03-16 LAB — TROPONIN I

## 2017-03-16 NOTE — ED Triage Notes (Signed)
Pt c/o pain in all quadrants of abdomin, N/V, starting this AM and worsening as the day progressed. Pt denies diarrhea.

## 2017-03-17 ENCOUNTER — Emergency Department
Admission: EM | Admit: 2017-03-17 | Discharge: 2017-03-17 | Disposition: A | Discharge status: Home / Self Care | Payer: No Typology Code available for payment source | Attending: Emergency Medicine | Admitting: Emergency Medicine

## 2017-03-17 DIAGNOSIS — R112 Nausea with vomiting, unspecified: Secondary | ICD-10-CM

## 2017-03-17 DIAGNOSIS — R1010 Upper abdominal pain, unspecified: Secondary | ICD-10-CM

## 2017-03-17 MED ORDER — ONDANSETRON 4 MG PO TBDP: 4.0000 mg | ORAL_TABLET | Freq: Three times a day (TID) | ORAL | 0 refills | Status: DC | PRN

## 2017-03-17 MED ORDER — ONDANSETRON HCL 4 MG/2ML IJ SOLN
4.0000 mg | Freq: Once | INTRAMUSCULAR | Status: AC
  Administered 2017-03-17: 4 mg via INTRAVENOUS
  Filled 2017-03-17: qty 2

## 2017-03-17 MED ORDER — SUCRALFATE 1 G PO TABS: 1.0000 g | ORAL_TABLET | Freq: Two times a day (BID) | ORAL | 0 refills | Status: DC

## 2017-03-17 MED ORDER — SODIUM CHLORIDE 0.9 % IV BOLUS (SEPSIS)
1000.0000 mL | Freq: Once | INTRAVENOUS | Status: AC
  Administered 2017-03-17: 1000 mL via INTRAVENOUS

## 2017-03-17 NOTE — ED Provider Notes (Signed)
[facility] Emergency Department Provider Note   ____________________________________________   First MD Initiated Contact with Patient 03/17/17 [phone]     (approximate)  I have reviewed the triage vital signs and the nursing notes.   HISTORY  Chief Complaint Abdominal Pain and Nausea    HPI Nicole Gould is a 56 y.o. female Who comes into the hospital today with symptoms of hypoglycemia nausea and vomiting. The patient reports that she got up this morning and she felt fine. She reports that she felt a sudden drop as if she was having low blood sugar. The patient states that a long time ago she was diagnosed with hypoglycemia. She had taken her acid reflux medicine and was waiting 30 minutes before she could eat. She reports that she grabbed a banana and a bowl of cereal. She had felt clammy jittery had some nausea with abdominal pain and had a headache. She reports though that after she hadeaten cereal she became nauseous and started vomiting. She reports that she had felt better but new she would have these symptoms of low blood sugar again so she ate immediately and then started vomiting again. The patient reports that the cycle went back and forth throughout the day and she wasn't really able to keep much down. She reports that she was feeling clammy all over and did have some abdominal pain. She states that she just felt terrible and couldn't eat or drink anything. The patient reports that she was scared as far as what was going on so she decided to come into the hospital today for evaluation. The patient had no chest pain or shortness of breath, no fevers no chills, no problems with urination or any other concerns or complaints. The patient denies pain at this time.    Past Medical History:  Diagnosis Date  . Chicken pox   . Colon polyps   . Concussion   . Frequent headaches   . GERD (gastroesophageal reflux disease)   . Hypertension   . Migraines   .  Pre-eclampsia    with grand mal seizures  . Seizure disorder in pregnancy ([place])   . Seizures ([place])    with pregnancy 1981- pre clampsia     Patient Active Problem List   Diagnosis Date Noted  . 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 Menopause present, declines hormone replacement therapy 10/25/2014  . Insomnia 05/05/2014  . Anxiety 05/05/2014  . Chronic venous insufficiency 04/21/2014  . Adenomatous polyp of colon 05/12/2013  . Visit for preventive health examination 03/14/2013  . Essential hypertension, benign 01/28/2013  . 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 COLONOSCOPY    . DILATION AND CURETTAGE OF UTERUS  09/08/2014   with hystereoscopy  . POLYPECTOMY    . TONSILLECTOMY    . TUBAL LIGATION      Prior to Admission medications   Medication Sig Start Date End Date Taking? Authorizing Provider  ALPRAZolam Duanne Moron) 0.5 MG tablet Take 1 tablet (0.5 mg total) by mouth 2 (two) times daily as needed for anxiety. 01/18/17   [name], MD  benzonatate (TESSALON) 100 MG capsule Take 1 capsule (100 mg total) by mouth 3 (three) times daily as needed for cough. 03/13/17   Jearld Fenton, NP  BIOTIN FORTE PO Take by mouth daily.    [provider]  BYSTOLIC 10 MG tablet TAKE  1 TABLET (10 MG TOTAL) BY MOUTH DAILY. 01/08/17   Minna Merritts, MD  Cholecalciferol (VITAMIN D-3) 1000 UNITS CAPS Take 1 capsule by mouth daily.    [provider]  hydrochlorothiazide (HYDRODIURIL) 25 MG tablet Take 0.5 tablets (12.5 mg total) by mouth daily. 10/06/16   [name], MD  MAGNESIUM MALATE PO Take 1 tablet by mouth daily.     [provider]  Multiple Vitamins-Minerals (MULTIVITAMIN WITH MINERALS) tablet Take 1 tablet by mouth daily.    [provider]  Nutritional Supplements ([place]) Take by mouth.    [provider]  Omega-3 Fatty Acids (FISH OIL) 1200 MG CAPS Take 1,200 mg by mouth daily.     [provider]  ondansetron (ZOFRAN ODT) 4 MG disintegrating tablet Take 1 tablet (4 mg total) by mouth every 8 (eight) hours as needed for nausea or vomiting. 03/17/17   Loney Hering, MD  pantoprazole (PROTONIX) 40 MG tablet Take 1 tablet (40 mg total) by mouth daily before breakfast. 03/09/17   Levin Erp, PA  sucralfate (CARAFATE) 1 g tablet Take 1 tablet (1 g total) by mouth 2 (two) times daily. 03/17/17   Loney Hering, MD    Allergies Anesthetics, amide; Benadryl [diphenhydramine hcl (sleep)]; Fentanyl; Losartan potassium-hctz; and Prilocaine  Family History  Problem Relation Age of Onset  . Hypertension Mother   . Diabetes Mother   . Heart disease Father   . Kidney disease Father   . Hypertension Father   . Diabetes Father   . Cancer Maternal Grandfather        colon cancer  . Colon cancer Maternal Grandfather        passed age 67   . Colon polyps Neg Hx     Social History Social History  Substance Use Topics  . Smoking status: Never Smoker  . Smokeless tobacco: Never Used  . Alcohol use Yes     Comment: very rare - glass red wine when that     Review of Systems  Constitutional: No fever/chills Eyes: No visual changes. ENT: No sore throat. Cardiovascular: Denies chest pain. Respiratory: Denies shortness of breath. Gastrointestinal:  abdominal pain, nausea, vomiting.  No diarrhea.  No constipation. Genitourinary: Negative for dysuria. Musculoskeletal: Negative for back pain. Skin: Negative for rash. Neurological: Negative for headaches, focal weakness or numbness.   ____________________________________________   PHYSICAL EXAM:  VITAL SIGNS: ED Triage Vitals  Enc Vitals Group     BP 03/16/17 2122 (!) 159/69     Pulse Rate 03/16/17 2122 60     Resp 03/17/17 0145 16     Temp 03/16/17 2122 97.7 F (36.5 C)     Temp Source 03/16/17  2122 Oral     SpO2 03/16/17 2122 99 %     Weight 03/16/17 2122 195 lb (88.5 kg)     Height --      Head Circumference --      Peak Flow --      Pain Score 03/17/17 0500 0     Pain Loc --      Pain Edu? --      Excl. in [place]? --     Constitutional: Alert and oriented. Well appearing and in mild distress. Eyes: Conjunctivae are normal. PERRL. EOMI. Head: Atraumatic. Nose: No congestion/rhinnorhea. Mouth/Throat: Mucous membranes are moist.  Oropharynx non-erythematous. Cardiovascular: Normal rate, regular rhythm. Grossly normal heart sounds.  Good peripheral circulation. Respiratory: Normal respiratory effort.  No retractions. Lungs CTAB. Gastrointestinal: Soft and nontender. No distention. positive bowel sounds Musculoskeletal: No lower extremity tenderness nor edema.   Neurologic:  Normal speech and language.  Skin:  Skin is warm, dry and intact.  Psychiatric: Mood and affect are normal.   ____________________________________________   LABS (all labs ordered are listed, but only abnormal results are displayed)  Labs Reviewed  COMPREHENSIVE METABOLIC PANEL - Abnormal; Notable for the following:       Result Value   Potassium 3.2 (*)    Chloride 98 (*)    Glucose, Bld 133 (*)    All other components within normal limits  URINALYSIS, COMPLETE (UACMP) WITH MICROSCOPIC - Abnormal; Notable for the following:    Color, Urine YELLOW (*)    APPearance CLOUDY (*)    pH 9.0 (*)    Protein, ur 100 (*)    Bacteria, UA RARE (*)    All other components within normal limits  LIPASE, BLOOD  CBC  TROPONIN I   ____________________________________________  EKG  ED ECG REPORT I, Loney Hering, the attending physician, personally viewed and interpreted this ECG.   Date: 03/16/2017  EKG Time: 2128  Rate: 59  Rhythm: sinus bradycardia  Axis: normal  Intervals:none  ST&T Change: none  ____________________________________________  RADIOLOGY  No results  found.  ____________________________________________   PROCEDURES  Procedure(s) performed: None  Procedures  Critical Care performed: No  ____________________________________________   INITIAL IMPRESSION / ASSESSMENT AND PLAN / ED COURSE  Pertinent labs & imaging results that were available during my care of the patient were reviewed by me and considered in my medical decision making (see chart for details).  This is a 56 year old female who comes into the hospital today with some nausea and vomiting as well as some hypoglycemia. My differential diagnosis for the patient includes gastritis, pancreatitis, cholecystitis or a stomach infection.  I did evaluate the patient and she denies any pain at this time. The patient's lipase was unremarkable and her blood work was also unremarkable. I gave the patient liter of normal saline as well as some Zofran for her symptoms. After the medication the patient reports that she was feeling much improved. The patient's urine is negative as well. I did have the patient do a by mouth trial while she was in the emergency department and she had no worsening pain or any vomiting or nausea. The patient will be discharged to follow-up with her primary care physician. Since the patient's pain is improved but did not perform any imaging studies. The patient has no complaints at this time.      ____________________________________________   FINAL CLINICAL IMPRESSION(S) / ED DIAGNOSES  Final diagnoses:  Pain of upper abdomen  Non-intractable vomiting with nausea, unspecified vomiting type      NEW MEDICATIONS STARTED DURING THIS VISIT:  New Prescriptions   ONDANSETRON (ZOFRAN ODT) 4 MG DISINTEGRATING TABLET    Take 1 tablet (4 mg total) by mouth every 8 (eight) hours as needed for nausea or vomiting.   SUCRALFATE (CARAFATE) 1 G TABLET    Take 1 tablet (1 g total) by mouth 2 (two) times daily.     Note:  This document was prepared using Dragon  voice recognition software and may include unintentional dictation errors.    Loney Hering, MD 03/17/17 [phone]

## 2017-03-17 NOTE — ED Notes (Signed)
Pt reports no c/o N/V following PO intake at this time.

## 2017-03-17 NOTE — Discharge Instructions (Signed)
Please follow up for further evaluation of your symptoms

## 2017-03-17 NOTE — ED Notes (Signed)
Pt given graham crackers and cranberry juice for PO challenge per Dr Dahlia Client.

## 2017-03-23 ENCOUNTER — Encounter: Payer: Self-pay | Admitting: Internal Medicine

## 2017-03-23 ENCOUNTER — Ambulatory Visit (INDEPENDENT_AMBULATORY_CARE_PROVIDER_SITE_OTHER): Payer: No Typology Code available for payment source | Admitting: Internal Medicine

## 2017-03-23 VITALS — BP 112/66 | HR 67 | Temp 97.6°F | Resp 15 | Ht 64.0 in | Wt 198.0 lb

## 2017-03-23 DIAGNOSIS — F419 Anxiety disorder, unspecified: Secondary | ICD-10-CM

## 2017-03-23 DIAGNOSIS — E162 Hypoglycemia, unspecified: Secondary | ICD-10-CM | POA: Diagnosis not present

## 2017-03-23 DIAGNOSIS — Z5320 Procedure and treatment not carried out because of patient's decision for unspecified reasons: Secondary | ICD-10-CM

## 2017-03-23 DIAGNOSIS — Z6833 Body mass index (BMI) 33.0-33.9, adult: Secondary | ICD-10-CM

## 2017-03-23 DIAGNOSIS — Z780 Asymptomatic menopausal state: Secondary | ICD-10-CM

## 2017-03-23 DIAGNOSIS — E6609 Other obesity due to excess calories: Secondary | ICD-10-CM | POA: Diagnosis not present

## 2017-03-23 LAB — POCT GLYCOSYLATED HEMOGLOBIN (HGB A1C): HEMOGLOBIN A1C: 5.4

## 2017-03-23 MED ORDER — ESCITALOPRAM OXALATE 5 MG PO TABS: 5.0000 mg | ORAL_TABLET | Freq: Every day | ORAL | 2 refills | Status: DC

## 2017-03-23 NOTE — Progress Notes (Signed)
Subjective:  Patient ID: Nicole Gould, female    DOB: [birth year]  Age: 56 y.o. MRN: [id number]  CC: The primary encounter diagnosis was Hypoglycemia. Diagnoses of Class 1 obesity due to excess calories without serious comorbidity with body mass index (BMI) of 33.0 to 33.9 in adult, Menopause present, declines hormone replacement therapy, and Anxiety were also pertinent to this visit.  HPI Nicole Gould presents for ER follow up  Seen on Sept 29 at [facility] with subjective symptoms of hypoglycemia accompanied by increased appetite followed by recurrent n/v and generalized weakness.  Vomiting occurred  after eating a banana,  Which she did because she suddenly felt weak and shaky and assumed her blood sugar was low.  Did not check blood sugar although she has been given a glucometer in the pats for this very purpose .  IN ER  Labs were normal,  Was given IV fluids and zofran.  Was taking PO by the end of the visit .  Accompanied by husband today. She describes recurrent and frequent sugar cravings,   Feels bad whenever she gets hungry,  But hasNEVER checked her blood sugar because she reports that she is too weak to do so.  Reports onset of headaches and irritability as well,  .   a long time ago had a reportedly documented BS of 40.  Occurred 8 or 9 years ago .   Has had similar symptoms (light headed,  Dizzy)  Since then     Treated for viral sinusitis with innear ear involvement resulting in vertigo on Monday , given nasal spray,  Zyrtec,  And flonase.. flonase helping the dizziness.     A1c was 5.5 in April  A1c is 5.4 today       Outpatient Medications Prior to Visit  Medication Sig Dispense Refill  . ALPRAZolam (XANAX) 0.5 MG tablet Take 1 tablet (0.5 mg total) by mouth 2 (two) times daily as needed for anxiety. 20 tablet 0  . benzonatate (TESSALON) 100 MG capsule Take 1 capsule (100 mg total) by mouth 3 (three) times daily as needed for cough. 30 capsule 0  . BIOTIN FORTE PO  Take by mouth daily.    Marland Kitchen BYSTOLIC 10 MG tablet TAKE 1 TABLET (10 MG TOTAL) BY MOUTH DAILY. 90 tablet 3  . Cholecalciferol (VITAMIN D-3) 1000 UNITS CAPS Take 1 capsule by mouth daily.    . hydrochlorothiazide (HYDRODIURIL) 25 MG tablet Take 0.5 tablets (12.5 mg total) by mouth daily. 90 tablet 3  . MAGNESIUM MALATE PO Take 1 tablet by mouth daily.     . Multiple Vitamins-Minerals (MULTIVITAMIN WITH MINERALS) tablet Take 1 tablet by mouth daily.    . Nutritional Supplements (WOMENS HEALTH SUPPORT PO) Take by mouth.    . Omega-3 Fatty Acids (FISH OIL) 1200 MG CAPS Take 1,200 mg by mouth daily.     . ondansetron (ZOFRAN ODT) 4 MG disintegrating tablet Take 1 tablet (4 mg total) by mouth every 8 (eight) hours as needed for nausea or vomiting. 20 tablet 0  . pantoprazole (PROTONIX) 40 MG tablet Take 1 tablet (40 mg total) by mouth daily before breakfast. 90 tablet 3  . sucralfate (CARAFATE) 1 g tablet Take 1 tablet (1 g total) by mouth 2 (two) times daily. 20 tablet 0   Facility-Administered Medications Prior to Visit  Medication Dose Route Frequency Provider Last Rate Last Dose  . 0.9 %  sodium chloride infusion  500 mL Intravenous Continuous Pyrtle, Lajuan Lines, MD  Review of Systems;  Patient denies headache, fevers, malaise, unintentional weight loss, skin rash, eye pain,sore throat, dysphagia,  hemoptysis , cough, dyspnea, wheezing, chest pain, palpitations, orthopnea, edema, abdominal pain, nausea, melena, diarrhea, constipation, flank pain, dysuria, hematuria, urinary  Frequency, nocturia, numbness, tingling, seizures,  Focal weakness, Loss of consciousness,  Tremor, insomnia, depression, anxiety, and suicidal ideation.      Objective:  BP 112/66 (BP Location: Left Arm, Patient Position: Sitting, Cuff Size: Large)   Pulse 67   Temp 97.6 F (36.4 C) (Oral)   Resp 15   Ht 5\' 4"  (1.626 m)   Wt 198 lb (89.8 kg)   LMP 12/01/2013 (Approximate)   SpO2 99%   BMI 33.99 kg/m   BP Readings  from Last 3 Encounters:  03/23/17 112/66  03/17/17 131/82  03/13/17 136/84    Wt Readings from Last 3 Encounters:  03/23/17 198 lb (89.8 kg)  03/16/17 195 lb (88.5 kg)  03/13/17 195 lb (88.5 kg)    General appearance: alert, anxious and mildly histrionic .  appears stated age Ears: normal TM's and external ear canals both ears Throat: lips, mucosa, and tongue normal; teeth and gums normal Neck: no adenopathy, no carotid bruit, supple, symmetrical, trachea midline and thyroid not enlarged, symmetric, no tenderness/mass/nodules Back: symmetric, no curvature. ROM normal. No CVA tenderness. Lungs: clear to auscultation bilaterally Heart: regular rate and rhythm, S1, S2 normal, no murmur, click, rub or gallop Abdomen: soft, non-tender; bowel sounds normal; no masses,  no organomegaly Pulses: 2+ and symmetric Skin: Skin color, texture, turgor normal. No rashes or lesions Lymph nodes: Cervical, supraclavicular, and axillary nodes normal.  Lab Results  Component Value Date   HGBA1C 5.4 03/23/2017   HGBA1C 5.5 10/06/2016   HGBA1C 5.5 08/27/2015    Lab Results  Component Value Date   CREATININE 0.68 03/16/2017   CREATININE 0.70 10/04/2016   CREATININE 0.73 08/27/2015    Lab Results  Component Value Date   WBC 4.7 03/16/2017   HGB 14.0 03/16/2017   HCT 40.8 03/16/2017   PLT 178 03/16/2017   GLUCOSE 133 (H) 03/16/2017   CHOL 175 08/27/2015   TRIG 55.0 08/27/2015   HDL 55.90 08/27/2015   LDLDIRECT 113.0 09/16/2014   LDLCALC 108 (H) 08/27/2015   ALT 23 03/16/2017   AST 27 03/16/2017   NA 141 03/16/2017   K 3.2 (L) 03/16/2017   CL 98 (L) 03/16/2017   CREATININE 0.68 03/16/2017   BUN 16 03/16/2017   CO2 31 03/16/2017   TSH 0.92 08/27/2015   HGBA1C 5.4 03/23/2017    No results found.  Assessment & Plan:   Problem List Items Addressed This Visit    Anxiety    She has agreed to a trial of lexapro 5 mg daily       Relevant Medications   escitalopram (LEXAPRO) 5 MG  tablet   Hypoglycemia - Primary    Subjective, with frequent episodes of weakness and tremulousness resulting in ER visits, with no evidence of diabetes or even prediabetes.  She is unable to lose weight due to increased appetite.  Referral to Endocrinology and Nutritional counselling .  Concurrent "dizzy"spells ay be due to sinus congestion an eustachian tube dysfunction.      Relevant Orders   POCT HgB A1C (Completed)   Ambulatory referral to Endocrinology   Menopause present, declines hormone replacement therapy    Encouraged to try low dose lexapro,  Given concurrent untreated GAD.        Other  Visit Diagnoses    Class 1 obesity due to excess calories without serious comorbidity with body mass index (BMI) of 33.0 to 33.9 in adult       Relevant Orders   Amb Referral to Nutrition and Diabetic E    A total of 25 minutes of face to face time was spent with patient more than half of which was spent in counselling about the above mentioned conditions  and coordination of care   I am having Ms. Ransdell start on escitalopram. I am also having her maintain her multivitamin with minerals, Vitamin D-3, Fish Oil, MAGNESIUM MALATE PO, BIOTIN FORTE PO, hydrochlorothiazide, Nutritional Supplements (WOMENS HEALTH SUPPORT PO), BYSTOLIC, ALPRAZolam, pantoprazole, benzonatate, ondansetron, and sucralfate. We will continue to administer sodium chloride.  Meds ordered this encounter  Medications  . escitalopram (LEXAPRO) 5 MG tablet    Sig: Take 1 tablet (5 mg total) by mouth daily.    Dispense:  30 tablet    Refill:  2    There are no discontinued medications.  Follow-up: No Follow-up on file.   [name], MD

## 2017-03-23 NOTE — Patient Instructions (Addendum)
I would  continue Zyrtec,  flonase and daily saline irrigations  for the next 3-4 weeks  You can add Afrin OTC spray for persistent  sinus congestion   If the dizziness persists,  I will  make an ENT referral   Please check your blood sugar whenever you start to feel shaky.  Dr Gabriel Carina will need this information when see sees you   I am recommending a trial of lexapro for your anxiety and your hot flushes    Start with 1/2 tablet daily after dinner (on a full stomach )   For the first week,  Then increase your dose to a full week    Hypoglycemia Hypoglycemia occurs when the level of sugar (glucose) in the blood is too low. Glucose is a type of sugar that provides the body's main source of energy. Certain hormones (insulin and glucagon) control the level of glucose in the blood. Insulin lowers blood glucose, and glucagon increases blood glucose. Hypoglycemia can result from having too much insulin in the bloodstream, or from not eating enough food that contains glucose. Hypoglycemia can happen in people who do or do not have diabetes. It can develop quickly, and it can be a medical emergency. What are the causes? Hypoglycemia occurs most often in people who have diabetes. If you have diabetes, hypoglycemia may be caused by:  Diabetes medicine.  Not eating enough, or not eating often enough.  Increased physical activity.  Drinking alcohol, especially when you have not eaten recently.  If you do not have diabetes, hypoglycemia may be caused by:  A tumor in the pancreas. The pancreas is the organ that makes insulin.  Not eating enough, or not eating for long periods at a time (fasting).  Severe infection or illness that affects the liver, heart, or kidneys.  Certain medicines.  You may also have reactive hypoglycemia. This condition causes hypoglycemia within 4 hours of eating a meal. This may occur after having stomach surgery. Sometimes, the cause of reactive hypoglycemia is not  known. What increases the risk? Hypoglycemia is more likely to develop in:  People who have diabetes and take medicines to lower blood glucose.  People who abuse alcohol.  People who have a severe illness.  What are the signs or symptoms? Hypoglycemia may not cause any symptoms. If you have symptoms, they may include:  Hunger.  Anxiety.  Sweating and feeling clammy.  Confusion.  Dizziness or feeling light-headed.  Sleepiness.  Nausea.  Increased heart rate.  Headache.  Blurry vision.  Seizure.  Nightmares.  Tingling or numbness around the mouth, lips, or tongue.  A change in speech.  Decreased ability to concentrate.  A change in coordination.  Restless sleep.  Tremors or shakes.  Fainting.  Irritability.  How is this diagnosed? Hypoglycemia is diagnosed with a blood test to measure your blood glucose level. This blood test is done while you are having symptoms. Your health care provider may also do a physical exam and review your medical history. If you do not have diabetes, other tests may be done to find the cause of your hypoglycemia. How is this treated? This condition can often be treated by immediately eating or drinking something that contains glucose, such as:  3-4 sugar tablets (glucose pills).  Glucose gel, 15-gram tube.  Fruit juice, 4 oz (120 mL).  Regular soda (not diet soda), 4 oz (120 mL).  Low-fat milk, 4 oz (120 mL).  Several pieces of hard candy.  Sugar or honey, 1  Tbsp.  Treating Hypoglycemia If You Have Diabetes  If you are alert and able to swallow safely, follow the 15:15 rule:  Take 15 grams of a rapid-acting carbohydrate. Rapid-acting options include: ? 1 tube of glucose gel. ? 3 glucose pills. ? 6-8 pieces of hard candy. ? 4 oz (120 mL) of fruit juice. ? 4 oz (120 ml) of regular (not diet) soda.  Check your blood glucose 15 minutes after you take the carbohydrate.  If the repeat blood glucose level is  still at or below 70 mg/dL (3.9 mmol/L), take 15 grams of a carbohydrate again.  If your blood glucose level does not increase above 70 mg/dL (3.9 mmol/L) after 3 tries, seek emergency medical care.  After your blood glucose level returns to normal, eat a meal or a snack within 1 hour.  Treating Severe Hypoglycemia Severe hypoglycemia is when your blood glucose level is at or below 54 mg/dL (3 mmol/L). Severe hypoglycemia is an emergency. Do not wait to see if the symptoms will go away. Get medical help right away. Call your local emergency services (911 in the [place] Do not drive yourself to the hospital. If you have severe hypoglycemia and you cannot eat or drink, you may need an injection of glucagon. A family member or close friend should learn how to check your blood glucose and how to give you a glucagon injection. Ask your health care provider if you need to have an emergency glucagon injection kit available. Severe hypoglycemia may need to be treated in a hospital. The treatment may include getting glucose through an IV tube. You may also need treatment for the cause of your hypoglycemia. Follow these instructions at home: General instructions  Avoid any diets that cause you to not eat enough food. Talk with your health care provider before you start any new diet.  Take over-the-counter and prescription medicines only as told by your health care provider.  Limit alcohol intake to no more than 1 drink per day for nonpregnant women and 2 drinks per day for men. One drink equals 12 oz of beer, 5 oz of wine, or 1 oz of hard liquor.  Keep all follow-up visits as told by your health care provider. This is important. If You Have Diabetes:   Make sure you know the symptoms of hypoglycemia.  Always have a rapid-acting carbohydrate snack with you to treat low blood sugar.  Follow your diabetes management plan, as told by your health care provider. Make sure you: ? Take your medicines as  directed. ? Follow your exercise plan. ? Follow your meal plan. Eat on time, and do not skip meals. ? Check your blood glucose as often as directed. Make sure to check your blood glucose before and after exercise. If you exercise longer or in a different way than usual, check your blood glucose more often. ? Follow your sick day plan whenever you cannot eat or drink normally. Make this plan in advance with your health care provider.  Share your diabetes management plan with people in your workplace, school, and household.  Check your urine for ketones when you are ill and as told by your health care provider.  Carry a medical alert card or wear medical alert jewelry. If You Have Reactive Hypoglycemia or Low Blood Sugar From Other Causes:  Monitor your blood glucose as told by your health care provider.  Follow instructions from your health care provider about eating or drinking restrictions. Contact a health care provider if:  You have problems keeping your blood glucose in your target range.  You have frequent episodes of hypoglycemia. Get help right away if:  You continue to have hypoglycemia symptoms after eating or drinking something containing glucose.  Your blood glucose is at or below 54 mg/dL (3 mmol/L).  You have a seizure.  You faint. These symptoms may represent a serious problem that is an emergency. Do not wait to see if the symptoms will go away. Get medical help right away. Call your local emergency services (911 in the [place] Do not drive yourself to the hospital. This information is not intended to replace advice given to you by your health care provider. Make sure you discuss any questions you have with your health care provider. Document Released: 06/05/2005 Document Revised: 11/17/2015 Document Reviewed: 07/09/2015 Elsevier Interactive Patient Education  [employer].

## 2017-03-25 DIAGNOSIS — E161 Other hypoglycemia: Secondary | ICD-10-CM | POA: Insufficient documentation

## 2017-03-25 NOTE — Assessment & Plan Note (Signed)
Encouraged to try low dose lexapro,  Given concurrent untreated GAD.

## 2017-03-25 NOTE — Assessment & Plan Note (Signed)
She has agreed to a trial of lexapro 5 mg daily

## 2017-03-25 NOTE — Assessment & Plan Note (Addendum)
Subjective, with frequent episodes of weakness and tremulousness resulting in ER visits, with no evidence of diabetes or even prediabetes.  She is unable to lose weight due to increased appetite.  Referral to Endocrinology and Nutritional counselling .  Concurrent "dizzy"spells ay be due to sinus congestion an eustachian tube dysfunction.

## 2017-04-13 ENCOUNTER — Telehealth: Payer: Self-pay | Admitting: Internal Medicine

## 2017-04-13 ENCOUNTER — Encounter: Payer: No Typology Code available for payment source | Attending: Internal Medicine | Admitting: Dietician

## 2017-04-13 VITALS — Wt 193.4 lb

## 2017-04-13 DIAGNOSIS — Z6833 Body mass index (BMI) 33.0-33.9, adult: Secondary | ICD-10-CM | POA: Diagnosis not present

## 2017-04-13 DIAGNOSIS — E6609 Other obesity due to excess calories: Secondary | ICD-10-CM | POA: Diagnosis not present

## 2017-04-13 DIAGNOSIS — Z713 Dietary counseling and surveillance: Secondary | ICD-10-CM | POA: Diagnosis not present

## 2017-04-13 NOTE — Progress Notes (Signed)
Medical Nutrition Therapy: Visit start time: 0240  end time: 1130  Assessment:  Diagnosis: obesity Past medical history: anxiety, HTN, hypoglycemia, see chart Psychosocial issues/ stress concerns: anxiety Preferred learning method:  . No preference indicated  Current weight: 193.4lb  Height: 5\' 4"  Medications, supplements: Women's MVI, Biotin, Vitamin D3, Omega-3 fatty acids, HCTZ, Protonix, see chart  Progress and evaluation: Patient c/o inability to lose weight and sx of hypoglycemia. Sx include nausea, jitters, syncope and clammy feeling which occur most in the morning while she it waiting to digest her Protonix but occasionally throughout the day. She keeps snacks on her to consume throughout the day in case she feels symptoms "coming on." Reports to eat a high fiber diet and consumes water frequently throughout the day. Does not eat red meat d/t h/o colon polyps and does not eat white bread or bread products if given the choice. Feels that it is most difficult for her to make healthy eating choices at dinner time and out to eat / at the movies. She and her husband have a date night each Friday and split a tub of popcorn, before eating dinner. Eats out for 2 dinners and 1 lunch on the weekends but on the week days dinner is not consumed much, or portions are small. Reports wt 42yrs ago: 160#, describing herself as very "weight conscious," weighing herself daily and exercising more often. Now she feels that she cannot lose weight.  Physical activity: walks 1-2 miles on lunch break, fitness team weight-training based workout Saturdays and some Thursdays, walks every Friday outside, weather-permitting  Dietary Intake:  Usual eating pattern includes 3 meals and 2 snacks per day. Dining out frequency: 3 meals per week.  Breakfast: kashi cereal with skim milk Snack: cheese stick + crackers, carrots or apple with PB, kashi multigrain bar Lunch: tuna packet on a sandwich thin + applesauce + cheese  stick, grapes, banana, apple, skinny pop popcorn, yogurt occasionally, Subway sandwich on whole grain bread Snack: premier protein shake + apple or banana Supper: [place], fish, chicken, taco soup, cereal, breaded chicken strips with some honey mustard, collards and other greens, beans, cabbage. Often gets child's portions if eating out IE a 4" sub Snack: n/a Beverages: water, rarely a sweet tea out to eat  Nutrition Care Education: Topics covered: effects of fiber and forms of carbohydrate on blood sugar, foods to avoid with acid reflux, eating at regular time intervals to avoid hypoglycemia symptoms, sources of hidden calories / fat such as condiments and in dishes prepared at restaurants, healthy and unhealthy fats, food and phys ed balance for weight loss Basic nutrition: basic food groups, appropriate nutrient balance, appropriate meal and snack schedule, general nutrition guidelines    Weight control: behavioral changes for weight loss, controlling portions Advanced nutrition: cooking techniques, dining out Hypertension: identifying high sodium foods Other lifestyle changes: benefits of making changes, readiness for change, identifying habits that need to change  Nutritional Diagnosis:  [place]-3.3 Overweight/obesity As related to lifestyle habits.  As evidenced by BMI 33.2.  Intervention: Discussion as noted above. Goals established for patient to work on implementing into her lifestyle. She will monitor for s/sx of hypoglycemia after adjusting her evening/dinner eating regimen. She also plans to adjust some of her habits when eating out or when going to the movies. Email provided for additional questions with encouragement to follow up if issues arise.  Education Materials given:  Marland Kitchen Quick and [employer] handout . Goals/ instructions  Learner/ who was taught:  .  Patient   Level of understanding: Marland Kitchen Verbalizes/ demonstrates competency  Demonstrated degree of understanding via:   Teach  back Learning barriers: . None  Willingness to learn/ readiness for change: . Eager, change in progress  Monitoring and Evaluation:  Dietary intake, exercise, episodes of hypoglycemia, and body weight      follow up: prn

## 2017-04-13 NOTE — Patient Instructions (Addendum)
   Continue to eat every few hours to help prevent symptoms of hypoglycemia. Think 4-6 "mini meals". Keep snacks on hand that are a mix of protein/ fiber/ complex carbohydrates.  Try eating dinner before going to the movies to prevent overeating popcorn  Remember to eat dinner regularly, including a serving of carbohydrates at this meal to help with symptoms of hypoglycemia overnight  Limit high fat, acidic, fried, greasy foods to help reduce acid reflux symptoms, particularly before bed  Practice making healthier swaps when eating out, paying attention to fat, sodium and sugar in particular

## 2017-04-13 NOTE — Telephone Encounter (Signed)
Pt called and stated that she has been taking escitalopram (LEXAPRO) 5 MG tablet for 2 weeks and she is just not feeling right. Pt states that she has been feeling out of sorts, dizzy, gets headaches on and off, and sometimes off balance. Please advise, thank you!  Call pt @ [phone]

## 2017-04-13 NOTE — Telephone Encounter (Signed)
She can stop the lexapro .  If the symptoms resolve,  Let me know and we will try an alternative.

## 2017-04-13 NOTE — Telephone Encounter (Signed)
Patient says she will stop Lexapro and call back next week to let us know how she is feeling.

## 2017-04-13 NOTE — Telephone Encounter (Signed)
Patient called and stated she is having intermittent dizziness, lightheadness, head aches, unsteady balance, hot flashes, numbness in her face. Symptoms has been going on intermittently for 2 weeks. She thinks its from the Lexapro since symptoms started around the time she began this medication. She has been taking the Lexapro every night as instructed. She says she is feeling okay at the moment.

## 2017-04-23 ENCOUNTER — Other Ambulatory Visit: Payer: Self-pay | Admitting: Internal Medicine

## 2017-04-23 DIAGNOSIS — Z1231 Encounter for screening mammogram for malignant neoplasm of breast: Secondary | ICD-10-CM

## 2017-04-24 ENCOUNTER — Ambulatory Visit
Admission: RE | Admit: 2017-04-24 | Discharge: 2017-04-24 | Disposition: A | Discharge status: Home / Self Care | Payer: No Typology Code available for payment source | Source: Ambulatory Visit | Attending: Internal Medicine | Admitting: Internal Medicine

## 2017-04-24 DIAGNOSIS — Z1231 Encounter for screening mammogram for malignant neoplasm of breast: Secondary | ICD-10-CM

## 2017-04-27 ENCOUNTER — Encounter: Payer: Self-pay | Admitting: Internal Medicine

## 2017-04-27 ENCOUNTER — Ambulatory Visit (INDEPENDENT_AMBULATORY_CARE_PROVIDER_SITE_OTHER): Payer: No Typology Code available for payment source | Admitting: Internal Medicine

## 2017-04-27 VITALS — BP 152/82 | HR 62 | Temp 97.9°F | Resp 15 | Ht 64.0 in | Wt 201.4 lb

## 2017-04-27 DIAGNOSIS — I872 Venous insufficiency (chronic) (peripheral): Secondary | ICD-10-CM

## 2017-04-27 DIAGNOSIS — E876 Hypokalemia: Secondary | ICD-10-CM | POA: Diagnosis not present

## 2017-04-27 DIAGNOSIS — R609 Edema, unspecified: Secondary | ICD-10-CM | POA: Diagnosis not present

## 2017-04-27 DIAGNOSIS — F604 Histrionic personality disorder: Secondary | ICD-10-CM

## 2017-04-27 DIAGNOSIS — F419 Anxiety disorder, unspecified: Secondary | ICD-10-CM | POA: Diagnosis not present

## 2017-04-27 LAB — BASIC METABOLIC PANEL
BUN: 20 mg/dL (ref 6–23)
CALCIUM: 9.8 mg/dL (ref 8.4–10.5)
CO2: 33 meq/L — AB (ref 19–32)
CREATININE: 0.72 mg/dL (ref 0.40–1.20)
Chloride: 101 mEq/L (ref 96–112)
GFR: 106.71 mL/min (ref >60.00)
GLUCOSE: 96 mg/dL (ref 70–99)
Potassium: 3.9 mEq/L (ref 3.5–5.1)
SODIUM: 140 meq/L (ref 135–145)

## 2017-04-27 NOTE — Progress Notes (Signed)
Subjective:  Patient ID: Nicole Gould, female    DOB: 03/25/1961  Age: 56 y.o. MRN: [id number]  CC: The primary encounter diagnosis was Hypokalemia. 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  HPI [name] presents for follow up on multiple symptoms raised at last visit one month ago.  She is accompanied by her husband who appears disinterested and aloof and does not acknowledge me or make eye contact when I enter the room.   GAD:  Did not tolerate a trial of Lexapro: tried it for two weeks but had recurrent dizziness and headaches, Numbness, hot flashes,  All of which apparently resolved with stopping the lexapro.    Resumed Taking a "menopause" supplement twice daily .  Feels the morning pill calms the flashes .  does not contain soy,  Has black cohosh root, valeria root, biotin and melatonin  in it recommended by a pharmacist in the ED.  Mitigates most but not all hot flash symptoms.    Presyncope:  Has not had any more episodes, so she has not checked her  BS,  although she continues to have episodes of feeling "jitery: when she does not eat.  had nutritional consult.  Did not find it very helpful  Except to eat more frequent meals, especially in the evening .  same advice given as here.  HTN:  Elevated BP reading here today .  Home checks have been high for the last couple of days 150 to 160  Weight gain:  3 lbs   Retaining fluid   Trying to walk more  High fiber diet using gummy bears   Outpatient Medications Prior to Visit  Medication Sig Dispense Refill  . BIOTIN FORTE PO Take by mouth daily.    Marland Kitchen BYSTOLIC 10 MG tablet TAKE 1 TABLET (10 MG TOTAL) BY MOUTH DAILY. 90 tablet 3  . Cholecalciferol (VITAMIN D-3) 1000 UNITS CAPS Take 1 capsule by mouth daily.    . hydrochlorothiazide (HYDRODIURIL) 25 MG tablet Take 0.5 tablets (12.5 mg total) by mouth daily. 90 tablet 3  .  MAGNESIUM MALATE PO Take 1 tablet by mouth daily.     . Multiple Vitamins-Minerals (MULTIVITAMIN WITH MINERALS) tablet Take 1 tablet by mouth daily.    . Nutritional Supplements (WOMENS HEALTH SUPPORT PO) Take by mouth.    . Omega-3 Fatty Acids (FISH OIL) 1200 MG CAPS Take 1,200 mg by mouth daily.     . pantoprazole (PROTONIX) 40 MG tablet Take 1 tablet (40 mg total) by mouth daily before breakfast. 90 tablet 3  . ALPRAZolam (XANAX) 0.5 MG tablet Take 1 tablet (0.5 mg total) by mouth 2 (two) times daily as needed for anxiety. (Patient not taking: Reported on 04/13/2017) 20 tablet 0  . escitalopram (LEXAPRO) 5 MG tablet Take 1 tablet (5 mg total) by mouth daily. (Patient not taking: Reported on 04/27/2017) 30 tablet 2  . benzonatate (TESSALON) 100 MG capsule Take 1 capsule (100 mg total) by mouth 3 (three) times daily as needed for cough. (Patient not taking: Reported on 04/13/2017) 30 capsule 0  . ondansetron (ZOFRAN ODT) 4 MG disintegrating tablet Take 1 tablet (4 mg total) by mouth every 8 (eight) hours as needed for nausea or vomiting. (Patient not taking: Reported on 04/13/2017) 20 tablet 0  . sucralfate (CARAFATE) 1 g tablet Take 1 tablet (1 g total) by mouth 2 (two) times daily. (Patient not taking: Reported on 04/13/2017) 20  tablet 0   Facility-Administered Medications Prior to Visit  Medication Dose Route Frequency Provider Last Rate Last Dose  . 0.9 %  sodium chloride infusion  500 mL Intravenous Continuous Pyrtle, Lajuan Lines, MD        Review of Systems;  Patient denies headache, fevers, malaise, unintentional weight loss, skin rash, eye pain, sinus congestion and sinus pain, sore throat, dysphagia,  hemoptysis , cough, dyspnea, wheezing, chest pain, palpitations, orthopnea, edema, abdominal pain, nausea, melena, diarrhea, constipation, flank pain, dysuria, hematuria, urinary  Frequency, nocturia, numbness, tingling, seizures,  Focal weakness, Loss of consciousness,  Tremor, insomnia,  depression, anxiety, and suicidal ideation.      Objective:  BP (!) 152/82 (BP Location: Left Arm, Patient Position: Sitting, Cuff Size: Large)   Pulse 62   Temp 97.9 F (36.6 C) (Oral)   Resp 15   Ht 5\' 4"  (1.626 m)   Wt 201 lb 6.4 oz (91.4 kg)   LMP 12/01/2013 (Approximate)   SpO2 100%   BMI 34.57 kg/m   BP Readings from Last 3 Encounters:  04/27/17 (!) 152/82  03/23/17 112/66  03/17/17 131/82    Wt Readings from Last 3 Encounters:  04/27/17 201 lb 6.4 oz (91.4 kg)  04/13/17 193 lb 6.4 oz (87.7 kg)  03/23/17 198 lb (89.8 kg)    General appearance: alert, cooperative and appears stated age Ears: normal TM's and external ear canals both ears Throat: lips, mucosa, and tongue normal; teeth and gums normal Neck: no adenopathy, no carotid bruit, supple, symmetrical, trachea midline and thyroid not enlarged, symmetric, no tenderness/mass/nodules Back: symmetric, no curvature. ROM normal. No CVA tenderness. Lungs: clear to auscultation bilaterally Heart: regular rate and rhythm, S1, S2 normal, no murmur, click, rub or gallop Abdomen: soft, non-tender; bowel sounds normal; no masses,  no organomegaly Pulses: 2+ and symmetric Skin: Skin color, texture, turgor normal. No rashes or lesions Lymph nodes: Cervical, supraclavicular, and axillary nodes normal.  Lab Results  Component Value Date   HGBA1C 5.4 03/23/2017   HGBA1C 5.5 10/06/2016   HGBA1C 5.5 08/27/2015    Lab Results  Component Value Date   CREATININE 0.72 04/27/2017   CREATININE 0.68 03/16/2017   CREATININE 0.70 10/04/2016    Lab Results  Component Value Date   WBC 4.7 03/16/2017   HGB 14.0 03/16/2017   HCT 40.8 03/16/2017   PLT 178 03/16/2017   GLUCOSE 96 04/27/2017   CHOL 175 08/27/2015   TRIG 55.0 08/27/2015   HDL 55.90 08/27/2015   LDLDIRECT 113.0 09/16/2014   LDLCALC 108 (H) 08/27/2015   ALT 23 03/16/2017   AST 27 03/16/2017   NA 140 04/27/2017   K 3.9 04/27/2017   CL 101 04/27/2017    CREATININE 0.72 04/27/2017   BUN 20 04/27/2017   CO2 33 (H) 04/27/2017   TSH 0.92 08/27/2015   HGBA1C 5.4 03/23/2017    Assessment & Plan:   Problem List Items Addressed This Visit    Anxiety    She did not tolerate a trial  Of lexapro.       Chronic venous insufficiency   Relevant Orders   Ambulatory referral to Vascular Surgery   Edema    Presumed due to venous insufficiency.  Venus ultrasound ordered.       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  She is now downplaying her previous symptoms and has not checked her blood sugar once in the last 30 days,  even when feeling tremulous.        Other Visit Diagnoses    Hypokalemia    -  Primary   Relevant Orders   Basic metabolic panel (Completed)    A total of 25 minutes of face to face time was spent with patient more than half of which was spent in counselling about the above mentioned conditions  and coordination of care   I have discontinued Alexica Pereida's benzonatate, ondansetron, and sucralfate. I am also having her maintain her multivitamin with minerals, Vitamin D-3, Fish Oil, MAGNESIUM MALATE PO, BIOTIN FORTE PO, hydrochlorothiazide, Nutritional Supplements (WOMENS HEALTH SUPPORT PO), BYSTOLIC, ALPRAZolam, pantoprazole, and escitalopram. We will continue to administer sodium chloride.  No orders of the defined types were placed in this encounter.   Medications Discontinued During This Encounter  Medication Reason  . benzonatate (TESSALON) 100 MG capsule Patient has not taken in last 30 days  . ondansetron (ZOFRAN ODT) 4 MG disintegrating tablet Patient has not taken in last 30 days  . sucralfate (CARAFATE) 1 g tablet Patient has not taken in last 30 days    Follow-up: No Follow-up on file.   [name], MD

## 2017-04-27 NOTE — Patient Instructions (Addendum)
If your bp  Is still > 130/80 next week,  Call and I will add amlodipine 2.5 mg daily to your regimen of bystolic   If your potassium is still low,  We can switch your hctz to spironolactone (still a fluid pill,  But saves potassium)   I will schedule you for a lower extremity ultrasound  to rule out venous insufficiency  As a cause  For your swollen ankles    Try using Beano 2 tablets before any meals containing vegetables or beans.  You can also use Phasyme or Gas X after the fact if you still have gas  Constipation causes increased gas.  So  You can add  Colace,  A  stool softener on a daily base up to 200 mg daily to supplement your gummy bears

## 2017-04-28 ENCOUNTER — Encounter: Payer: Self-pay | Admitting: Internal Medicine

## 2017-04-29 DIAGNOSIS — R609 Edema, unspecified: Secondary | ICD-10-CM | POA: Insufficient documentation

## 2017-04-29 DIAGNOSIS — F604 Histrionic personality disorder: Secondary | ICD-10-CM | POA: Insufficient documentation

## 2017-04-29 NOTE — Assessment & Plan Note (Signed)
Her entire demeanor today is dramatically different than one month ago.  She is now downplaying her previous symptoms and has not checked her blood sugar once in the last 30 days, even when feeling tremulous.

## 2017-04-29 NOTE — Assessment & Plan Note (Signed)
Presumed due to venous insufficiency.  Venus ultrasound ordered.

## 2017-04-29 NOTE — Assessment & Plan Note (Signed)
She did not tolerate a trial  Of lexapro.

## 2017-05-01 ENCOUNTER — Telehealth: Payer: Self-pay | Admitting: Internal Medicine

## 2017-05-01 NOTE — Telephone Encounter (Signed)
Copied from [place] [id number]. Topic: General - Other >> May 01, 2017  1:28 PM Darl Householder, RMA wrote: Reason for CRM: Patient is requesting lab results and also has questions concerning BP, please return call pt at [phone]

## 2017-05-01 NOTE — Telephone Encounter (Signed)
Please advise 

## 2017-05-02 ENCOUNTER — Telehealth: Payer: Self-pay | Admitting: Internal Medicine

## 2017-05-02 MED ORDER — AMLODIPINE BESYLATE 2.5 MG PO TABS: 2.5000 mg | ORAL_TABLET | Freq: Every day | ORAL | 3 refills | Status: DC

## 2017-05-02 NOTE — Telephone Encounter (Signed)
Patient called this morning concerning lab results and to notify PCP that her BP are steadily going up in the mornings her BP is 128/73 pulse 62 by afternoon the highest is 169/92 pulse 63 patient says she feels bad and has headaches when pressure is this high. Patient stated PCP had mentioned trying new BP medication if pressures continued to be high patient is willing to try new medication.

## 2017-05-02 NOTE — Telephone Encounter (Signed)
Patient aware of lab results.

## 2017-05-02 NOTE — Telephone Encounter (Signed)
Adding amlodipine 2.5 mg daily per after visit summary

## 2017-05-02 NOTE — Telephone Encounter (Signed)
Patient notified and voiced understanding.

## 2017-05-04 ENCOUNTER — Other Ambulatory Visit: Payer: Self-pay

## 2017-05-04 DIAGNOSIS — I872 Venous insufficiency (chronic) (peripheral): Secondary | ICD-10-CM

## 2017-05-13 IMAGING — MR MR HEAD W/O CM
10 series · 48 of 48 positions shown · non-contrast
Comparison: Head CT 10/04/2016

CLINICAL DATA: Dizziness with left upper extremity paresthesia

EXAM:
MRI HEAD WITHOUT CONTRAST
TECHNIQUE: Multiplanar, multiecho pulse sequences of the brain and surrounding
structures were obtained without intravenous contrast.

[Series 2: T1 · sagittal · 5.0mm · 0.45mm/px · 3 of 25 slices shown (1 of 2)]
[im 1/25]
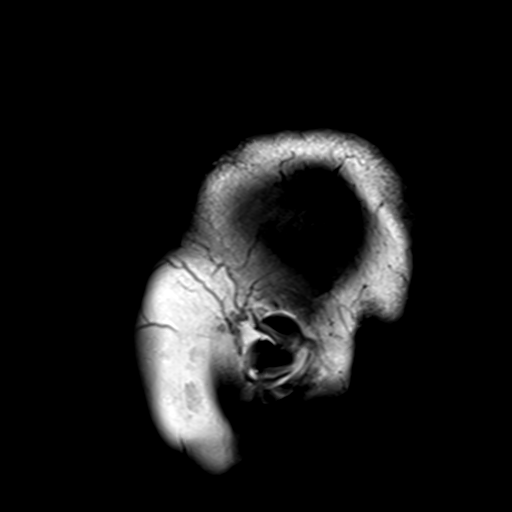
[im 13/25]
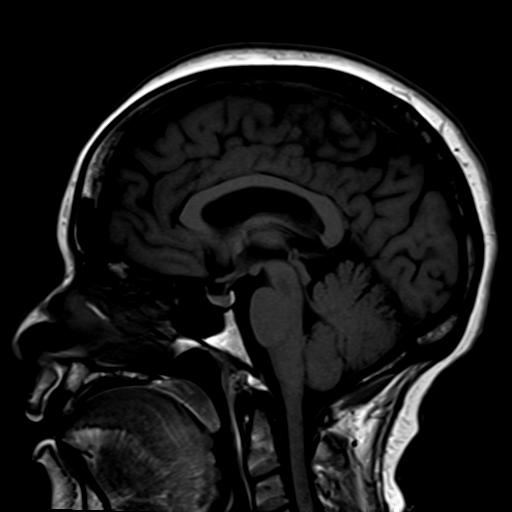
[im 25/25]
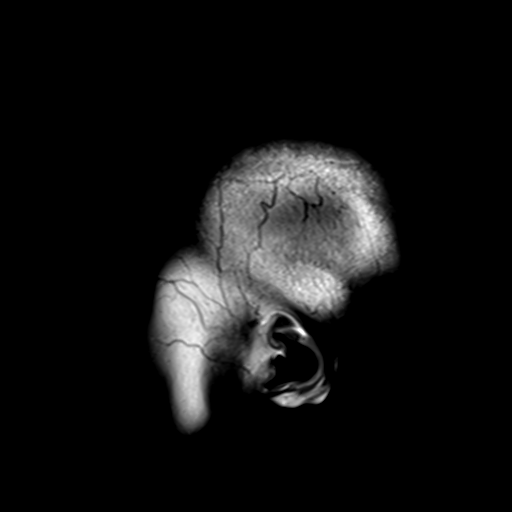

[Series 4: DWI · axial · 3.0mm · 1.80mm/px · z∈[-56,+105]mm · 5 of 55 slices shown (1 of 2)]
[im 1/55]
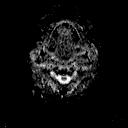
[im 14/55]
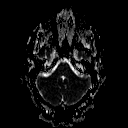
[im 28/55]
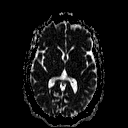
[im 41/55]
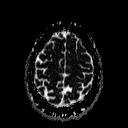
[im 55/55]
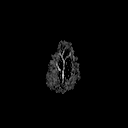

[Series 6: DWI · coronal · 3.0mm · 1.80mm/px · 4 of 45 slices shown (2 of 2)]
[im 1/45]
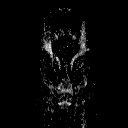
[im 15/45]
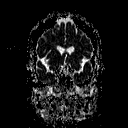
[im 30/45]
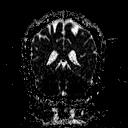
[im 45/45]
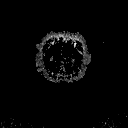

[Series 7: T2 · axial · 5.0mm · 0.60mm/px · z∈[-54,+101]mm · 2 of 25 slices shown (1 of 3)]
[im 1/25]
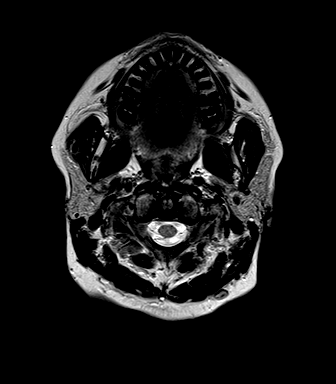
[im 25/25]
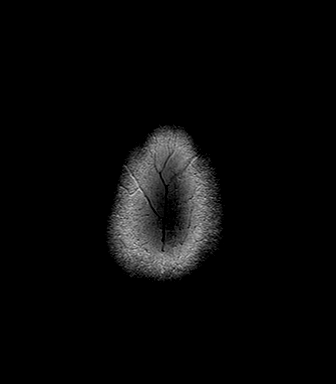

[Series 8: FLAIR · axial · 3.0mm · 0.45mm/px · z∈[-54,+101]mm · 5 of 53 slices shown]
[im 1/53]
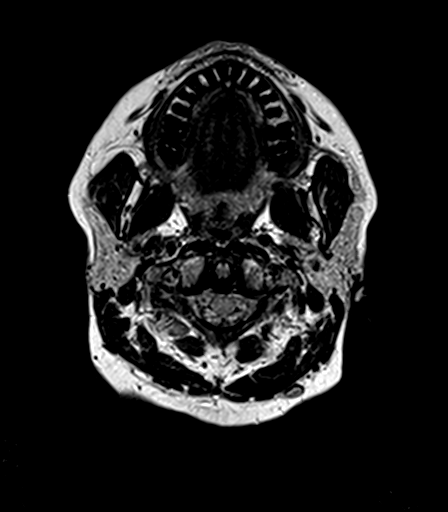
[im 14/53]
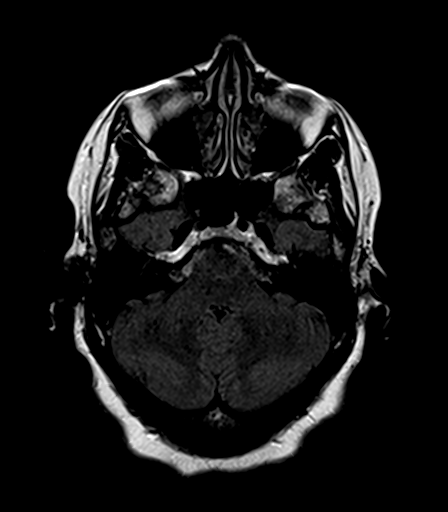
[im 27/53]
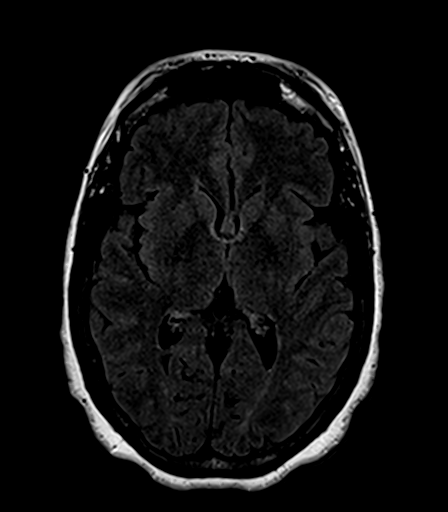
[im 40/53]
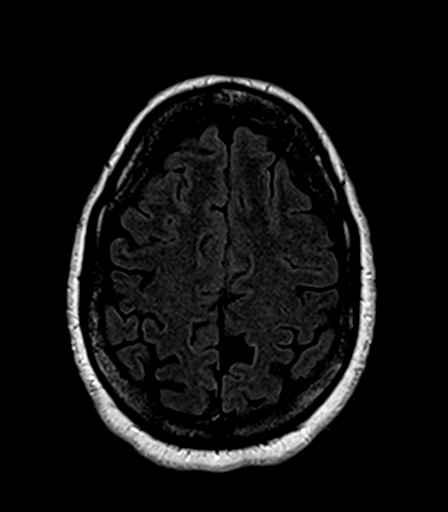
[im 53/53]
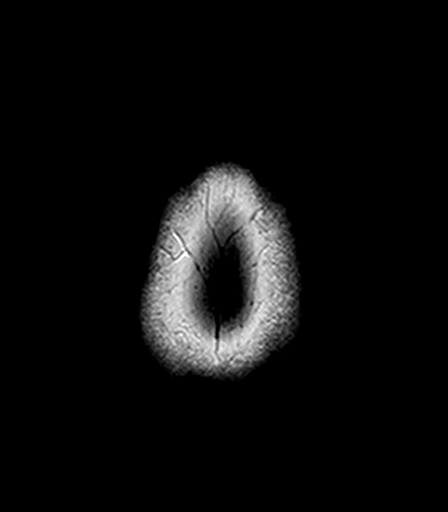

[Series 9: T1 · axial · 1.0mm · 1.00mm/px · z∈[-62,+112]mm · 16 of 176 slices shown (2 of 2)]
[im 1/176]
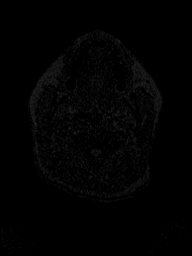
[im 12/176]
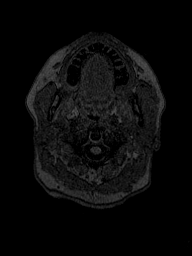
[im 24/176]
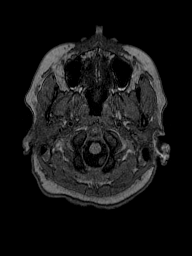
[im 36/176]
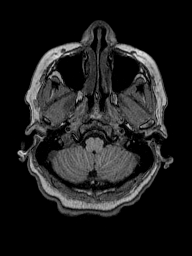
[im 47/176]
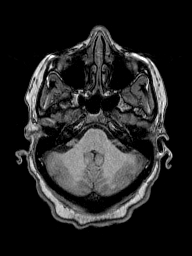
[im 59/176]
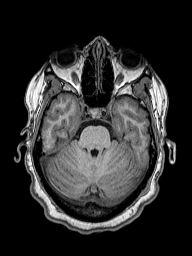
[im 71/176]
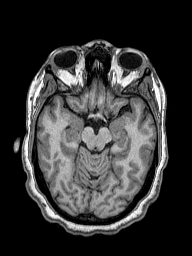
[im 82/176]
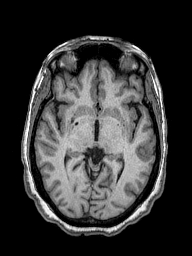
[im 94/176]
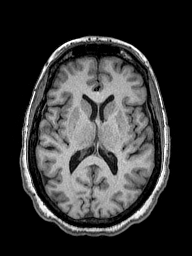
[im 106/176]
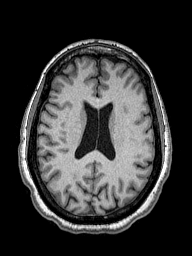
[im 117/176]
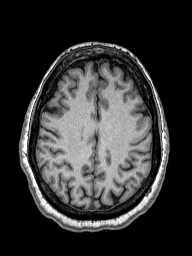
[im 129/176]
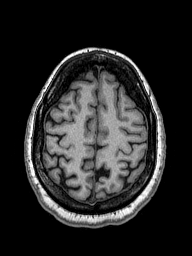
[im 141/176]
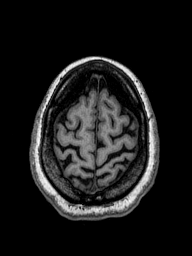
[im 152/176]
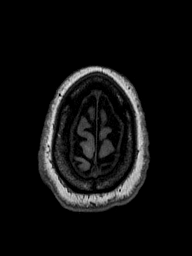
[im 164/176]
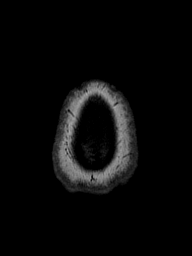
[im 176/176]
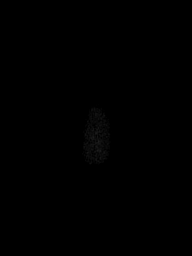

[Series 10: T2 · axial · 5.0mm · 0.45mm/px · z∈[-54,+101]mm · 2 of 25 slices shown (2 of 3)]
[im 1/25]
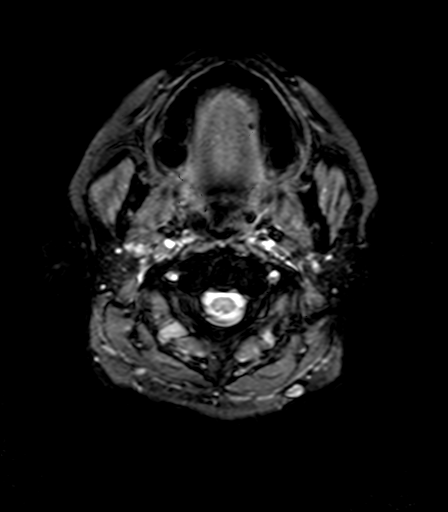
[im 25/25]
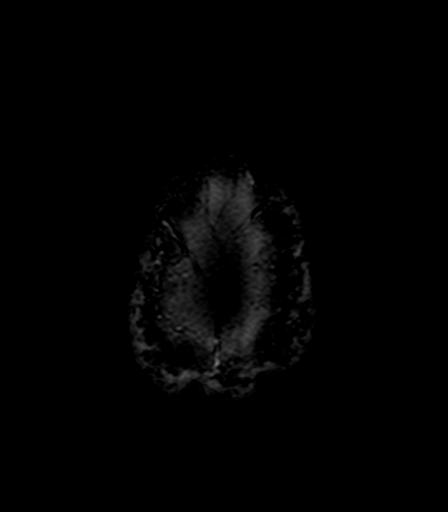

[Series 11: T2 · coronal · 5.0mm · 0.49mm/px · 2 of 27 slices shown (3 of 3)]
[im 1/27]
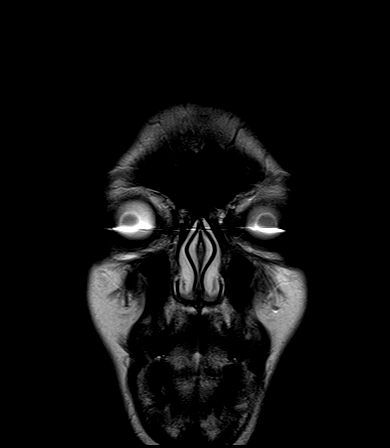
[im 27/27]
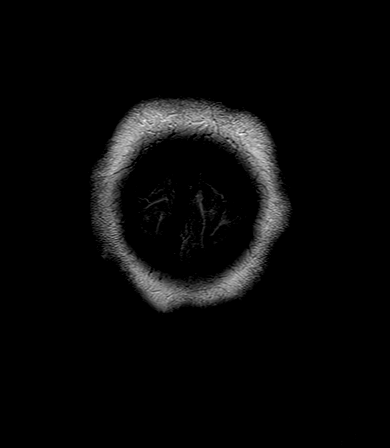

[Series 100: (id) · axial · 3.0mm · 1.80mm/px · z∈[-56,+105]mm · 5 of 53 slices shown]
[im 1/53]
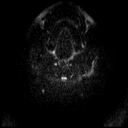
[im 14/53]
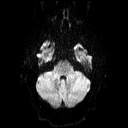
[im 27/53]
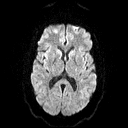
[im 40/53]
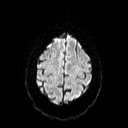
[im 53/53]
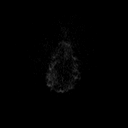

[Series 101: (id) cor · coronal · 3.0mm · 1.80mm/px · 4 of 44 slices shown]
[im 1/44]
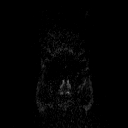
[im 15/44]
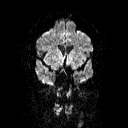
[im 29/44]
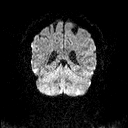
[im 44/44]
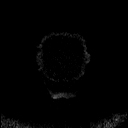

[48 of 48 positions shown; findings below may reference images not displayed]

FINDINGS: Brain: No focal diffusion restriction to indicate acute infarct. No
intraparenchymal hemorrhage. Single focus of hyperintensity in the
right parietal white matter. This finding is nonspecific and not
uncommon in this age group, possibly related to chronic
microvascular ischemia. No mass lesion or midline shift. No
hydrocephalus or extra-axial fluid collection. The midline
structures are normal. No age advanced or lobar predominant atrophy.

Vascular: Major intracranial arterial and venous sinus flow voids
are preserved. No evidence of chronic microhemorrhage or amyloid
angiopathy.

Skull and upper cervical spine: The visualized skull base,
calvarium, upper cervical spine and extracranial soft tissues are
normal.

Sinuses/Orbits: No fluid levels or advanced mucosal thickening. No
mastoid effusion. Normal orbits.
IMPRESSION: No acute intracranial abnormality or finding to explain the
patient's dizziness.

## 2017-06-01 ENCOUNTER — Telehealth: Payer: Self-pay

## 2017-06-01 NOTE — Telephone Encounter (Signed)
Copied from [place]. Topic: General - Other >> Jun 01, 2017  8:25 AM Carolyn Stare wrote:   Pt call to req a follow up on her bp med change. She is asking if it is ok for her to come in in January or does she want to see her this month  would like a call back  [phone]

## 2017-06-01 NOTE — Telephone Encounter (Signed)
January is fine

## 2017-06-01 NOTE — Telephone Encounter (Signed)
Please advise 

## 2017-06-04 NOTE — Telephone Encounter (Signed)
LMTCB. Need to schedule pt an appt in January with Dr. [name].

## 2017-06-07 ENCOUNTER — Encounter: Payer: Self-pay | Admitting: Vascular Surgery

## 2017-06-07 ENCOUNTER — Encounter (HOSPITAL_COMMUNITY): Payer: Self-pay

## 2017-06-14 NOTE — Telephone Encounter (Signed)
Pt has an appt scheduled for 07/13/2017 with Dr. [name].

## 2017-06-27 ENCOUNTER — Encounter (HOSPITAL_COMMUNITY): Payer: Self-pay

## 2017-06-27 ENCOUNTER — Encounter: Payer: Self-pay | Admitting: Vascular Surgery

## 2017-06-29 ENCOUNTER — Encounter (HOSPITAL_COMMUNITY): Payer: Self-pay

## 2017-06-29 ENCOUNTER — Encounter: Payer: Self-pay | Admitting: Vascular Surgery

## 2017-06-29 NOTE — Telephone Encounter (Signed)
°  Relation to pt: self Call back number: [phone]    Reason for call:  Patient will be in [place] on 07/13/17 and had to [facility] her appointment, PCP next available Tuesday 08/14/2017 at 1:30pm, patient would like to be seen sooner then February, preferable on Friday due to patient being off from work, please advise

## 2017-07-02 NOTE — Telephone Encounter (Signed)
Yes, ok to use an 11:30 or 4:30 slot

## 2017-07-02 NOTE — Telephone Encounter (Signed)
Would it be okay to schedule pt in an 11:30am or 4:30pm slot if available before 07/13/2017?

## 2017-07-04 NOTE — Telephone Encounter (Signed)
Can you schedule the pt for Friday 07/27/2017 at 11:30am per Dr. [name] and then cancel the 08/14/2017 appt at 1:30pm. Thank you so much.

## 2017-07-04 NOTE — Telephone Encounter (Signed)
The patient has been scheduled

## 2017-07-06 ENCOUNTER — Ambulatory Visit: Payer: Self-pay | Admitting: Internal Medicine

## 2017-07-10 ENCOUNTER — Other Ambulatory Visit: Payer: Self-pay | Admitting: Internal Medicine

## 2017-07-13 ENCOUNTER — Ambulatory Visit: Payer: Self-pay | Admitting: Internal Medicine

## 2017-07-16 ENCOUNTER — Other Ambulatory Visit: Payer: Self-pay

## 2017-07-16 ENCOUNTER — Ambulatory Visit: Payer: No Typology Code available for payment source | Admitting: Family Medicine

## 2017-07-16 ENCOUNTER — Ambulatory Visit: Payer: Self-pay | Admitting: Internal Medicine

## 2017-07-16 ENCOUNTER — Encounter: Payer: Self-pay | Admitting: Family Medicine

## 2017-07-16 DIAGNOSIS — J0100 Acute maxillary sinusitis, unspecified: Secondary | ICD-10-CM

## 2017-07-16 DIAGNOSIS — J329 Chronic sinusitis, unspecified: Secondary | ICD-10-CM | POA: Insufficient documentation

## 2017-07-16 MED ORDER — FLUCONAZOLE 150 MG PO TABS: 150.0000 mg | ORAL_TABLET | Freq: Once | ORAL | 0 refills | Status: AC

## 2017-07-16 MED ORDER — HYDROCODONE-HOMATROPINE 5-1.5 MG/5ML PO SYRP: 5.0000 mL | ORAL_SOLUTION | Freq: Three times a day (TID) | ORAL | 0 refills | Status: DC | PRN

## 2017-07-16 MED ORDER — AMOXICILLIN-POT CLAVULANATE 875-125 MG PO TABS: 1.0000 | ORAL_TABLET | Freq: Two times a day (BID) | ORAL | 0 refills | Status: DC

## 2017-07-16 NOTE — Patient Instructions (Signed)
Nice to see you. You likely have a sinus infection.  We will treat you with Augmentin.  If you develop yeast infection symptoms please take the Diflucan. You can use the Hycodan for cough.  This may make you drowsy.  Do not drive while taking this. If you develop shortness of breath, cough productive of blood, or any new or changing symptoms please seek medical attention.

## 2017-07-16 NOTE — Progress Notes (Signed)
  Tommi Rumps, MD Phone: [phone]  Nicole Gould is a 57 y.o. female who presents today for same-day visit.  Patient notes for the last 7 days she has had symptoms.  Started with postnasal drip and then developed sinus congestion and cough.  Cough has become productive of clear thick mucus.  Blowing yellowish thick mucus out of her nose.  She initially improved though has worsened over the last day to 2.  Voice comes and goes.  She feels achy in her maxillary sinuses.  T-max 99 F last week.  Had some chills.  No dyspnea.  [facility] and started on a Z-Pak and albuterol relating to some wheezing that she had.  She has not improved with the Z-Pak.  She does note sick contacts at work.  Social History   Tobacco Use  Smoking Status Never Smoker  Smokeless Tobacco Never Used     ROS see history of present illness  Objective  Physical Exam Vitals:   07/16/17 1001  BP: 140/90  Pulse: 63  Temp: 98.2 F (36.8 C)  SpO2: 96%    BP Readings from Last 3 Encounters:  07/16/17 140/90  04/27/17 (!) 152/82  03/23/17 112/66   Wt Readings from Last 3 Encounters:  07/16/17 195 lb 3.2 oz (88.5 kg)  04/27/17 201 lb 6.4 oz (91.4 kg)  04/13/17 193 lb 6.4 oz (87.7 kg)    Physical Exam  Constitutional: No distress.  HENT:  Head: Normocephalic and atraumatic.  Mouth/Throat: Oropharynx is clear and moist. No oropharyngeal exudate.  Normal TMs  Eyes: Conjunctivae are normal. Pupils are equal, round, and reactive to light.  Neck: Neck supple.  Cardiovascular: Normal rate, regular rhythm and normal heart sounds.  Pulmonary/Chest: Effort normal and breath sounds normal.  Musculoskeletal: She exhibits no edema.  Lymphadenopathy:    She has no cervical adenopathy.  Neurological: She is alert. Gait normal.  Skin: Skin is warm and dry. She is not diaphoretic.     Assessment/Plan: Please see individual problem list.  Sinusitis Patient symptoms most  consistent with sinusitis particularly since she improved and then has worsened again.  Patient will discontinue the azithromycin.  We will start her on Augmentin.  Diflucan given in case she develops a yeast infection with this.  Hycodan for cough.  Advised this could make her drowsy.  Work note given.  Return precautions given.  If not improving by Friday she will contact us.   No orders of the defined types were placed in this encounter.   Meds ordered this encounter  Medications  . amoxicillin-clavulanate (AUGMENTIN) 875-125 MG tablet    Sig: Take 1 tablet by mouth 2 (two) times daily.    Dispense:  14 tablet    Refill:  0  . fluconazole (DIFLUCAN) 150 MG tablet    Sig: Take 1 tablet (150 mg total) by mouth once for 1 dose.    Dispense:  1 tablet    Refill:  0  . HYDROcodone-homatropine (HYCODAN) 5-1.5 MG/5ML syrup    Sig: Take 5 mLs by mouth every 8 (eight) hours as needed for cough.    Dispense:  120 mL    Refill:  0     Tommi Rumps, MD [place]

## 2017-07-16 NOTE — Assessment & Plan Note (Signed)
Patient symptoms most consistent with sinusitis particularly since she improved and then has worsened again.  Patient will discontinue the azithromycin.  We will start her on Augmentin.  Diflucan given in case she develops a yeast infection with this.  Hycodan for cough.  Advised this could make her drowsy.  Work note given.  Return precautions given.  If not improving by Friday she will contact us.

## 2017-07-18 ENCOUNTER — Telehealth: Payer: Self-pay | Admitting: Family Medicine

## 2017-07-18 NOTE — Telephone Encounter (Signed)
Completed.  Please make available for pickup. 

## 2017-07-18 NOTE — Telephone Encounter (Signed)
Please advise   Copied from [place] [id number]. Topic: General - Other >> Jul 18, 2017  7:51 AM Carolyn Stare wrote:  Pt saw Dr Caryl Bis on 07/16/17 and was given a note to be out of work and told her if she needed the note extended to call back  07/17/17 thru 07/18/17 she will return to work on 07/19/17  Pt said she still not feeling good  [phone]

## 2017-07-18 NOTE — Telephone Encounter (Signed)
Patient notified and placed at front desk

## 2017-07-18 NOTE — Telephone Encounter (Signed)
Please advise 

## 2017-07-27 ENCOUNTER — Ambulatory Visit (INDEPENDENT_AMBULATORY_CARE_PROVIDER_SITE_OTHER): Payer: No Typology Code available for payment source | Admitting: Internal Medicine

## 2017-07-27 ENCOUNTER — Encounter: Payer: Self-pay | Admitting: Internal Medicine

## 2017-07-27 DIAGNOSIS — E669 Obesity, unspecified: Secondary | ICD-10-CM

## 2017-07-27 DIAGNOSIS — G8929 Other chronic pain: Secondary | ICD-10-CM

## 2017-07-27 DIAGNOSIS — R102 Pelvic and perineal pain: Secondary | ICD-10-CM | POA: Diagnosis not present

## 2017-07-27 DIAGNOSIS — E66811 Obesity, class 1: Secondary | ICD-10-CM

## 2017-07-27 DIAGNOSIS — E162 Hypoglycemia, unspecified: Secondary | ICD-10-CM

## 2017-07-27 DIAGNOSIS — I10 Essential (primary) hypertension: Secondary | ICD-10-CM

## 2017-07-27 DIAGNOSIS — F604 Histrionic personality disorder: Secondary | ICD-10-CM

## 2017-07-27 NOTE — Patient Instructions (Addendum)
REGARDING YOUR BLOO PRESSURE:  take the amlodipine in the evening  And the bystolic and the  hctz in the morning   Your arm circumference  is 34 cm  So you should be using and adult normal size cuff    FOR YOUR COUGH:   Ok to resume using tessalon  perles to suppress cough  add 12.5 mg nighttime benadryl for post  Nasal drip (dipenhyramine )   REGARDING YOUR WEIGHT :  I FEEL YOUR MOTIVATION!   THE KETO DIET IS AN EXCELLENT CHOICE FOR YOU!

## 2017-07-27 NOTE — Progress Notes (Signed)
Subjective:  Patient ID: [name], female    DOB: 1960/09/08  Age: 57 y.o. 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  HPI Nicole Gould presents for follow up on hypertension and multiple other issues.  .    Patient has notivcd that some offices are using normal sized cuff and getting higher readings than with the large sized cuff   Still with some cough and hoarseness. Finished 2 rounds of antibiotics . Was using the tessalon perles,  Then given tussionex which made her feel weird ,   Having night sweats from menopause   Having right flank and hip pain with waking,  Intermittent always when in bed.  Lasts less than 1/2 hour   Dry mouth  Occasional bladder spasms   Obesity: discussed weight issues and KETO  diet Body mass index is 33.51 kg/m.   Saw endocrinology for concern about hypoglycemia.  Told to returnif BS dropped below 60.   Outpatient Medications Prior to Visit  Medication Sig Dispense Refill  . albuterol (PROVENTIL HFA;VENTOLIN HFA) 108 (90 Base) MCG/ACT inhaler     . BIOTIN FORTE PO Take by mouth daily.    Marland Kitchen BYSTOLIC 10 MG tablet TAKE 1 TABLET (10 MG TOTAL) BY MOUTH DAILY. 90 tablet 3  . Cholecalciferol (VITAMIN D-3) 1000 UNITS CAPS Take 1 capsule by mouth daily.    . fluticasone (FLONASE) 50 MCG/ACT nasal spray SHAKE LQ AND U 1 SPR IEN D  0  . hydrochlorothiazide (HYDRODIURIL) 25 MG tablet Take 0.5 tablets (12.5 mg total) by mouth daily. 90 tablet 3  . MAGNESIUM MALATE PO Take 1 tablet by mouth daily.     . Multiple Vitamins-Minerals (MULTIVITAMIN WITH MINERALS) tablet Take 1 tablet by mouth daily.    . Nutritional Supplements (WOMENS HEALTH SUPPORT PO) Take by mouth.    . Omega-3 Fatty Acids (FISH OIL) 1200 MG CAPS Take 1,200 mg by mouth daily.     . pantoprazole (PROTONIX) 40 MG tablet Take 1 tablet  (40 mg total) by mouth daily before breakfast. 90 tablet 3  . amoxicillin-clavulanate (AUGMENTIN) 875-125 MG tablet Take 1 tablet by mouth 2 (two) times daily. (Patient not taking: Reported on 07/27/2017) 14 tablet 0  . benzonatate (TESSALON) 100 MG capsule TAKE 1 CAPSULE BY MOUTH THREE TIMES A DAY AS NEEDED FOR COUGH (Patient not taking: Reported on 07/27/2017) 30 capsule 0  . HYDROcodone-homatropine (HYCODAN) 5-1.5 MG/5ML syrup Take 5 mLs by mouth every 8 (eight) hours as needed for cough. (Patient not taking: Reported on 07/27/2017) 120 mL 0   Facility-Administered Medications Prior to Visit  Medication Dose Route Frequency Provider Last Rate Last Dose  . 0.9 %  sodium chloride infusion  500 mL Intravenous Continuous Pyrtle, Lajuan Lines, MD        Review of Systems;  Patient denies headache, fevers, malaise, unintentional weight loss, skin rash, eye pain, sinus congestion and sinus pain, sore throat, dysphagia,  hemoptysis , cough, dyspnea, wheezing, chest pain, palpitations, orthopnea, edema, abdominal pain, nausea, melena, diarrhea, constipation, flank pain, dysuria, hematuria, urinary  Frequency, nocturia, numbness, tingling, seizures,  Focal weakness, Loss of consciousness,  Tremor, insomnia, depression, anxiety, and suicidal ideation.      Objective:  BP 130/66 (BP Location: Left Arm, Patient Position: Sitting, Cuff Size: Normal)   Pulse 60   Temp 97.7 F (36.5 C) (Oral)   Resp 15   Ht  5\' 4"  (1.626 m)   Wt 195 lb 3.2 oz (88.5 kg)   LMP 12/01/2013 (Approximate)   SpO2 99%   BMI 33.51 kg/m   BP Readings from Last 3 Encounters:  07/27/17 130/66  07/16/17 140/90  04/27/17 (!) 152/82    Wt Readings from Last 3 Encounters:  07/27/17 195 lb 3.2 oz (88.5 kg)  07/16/17 195 lb 3.2 oz (88.5 kg)  04/27/17 201 lb 6.4 oz (91.4 kg)    General appearance: alert, cooperative and appears stated age Ears: normal TM's and external ear canals both ears Throat: lips, mucosa, and tongue normal; teeth  and gums normal Neck: no adenopathy, no carotid bruit, supple, symmetrical, trachea midline and thyroid not enlarged, symmetric, no tenderness/mass/nodules Back: symmetric, no curvature. ROM normal. No CVA tenderness. Lungs: clear to auscultation bilaterally Heart: regular rate and rhythm, S1, S2 normal, no murmur, click, rub or gallop Abdomen: soft, non-tender; bowel sounds normal; no masses,  no organomegaly Pulses: 2+ and symmetric Skin: Skin color, texture, turgor normal. No rashes or lesions Lymph nodes: Cervical, supraclavicular, and axillary nodes normal.  Lab Results  Component Value Date   HGBA1C 5.4 03/23/2017   HGBA1C 5.5 10/06/2016   HGBA1C 5.5 08/27/2015    Lab Results  Component Value Date   CREATININE 0.72 04/27/2017   CREATININE 0.68 03/16/2017   CREATININE 0.70 10/04/2016    Lab Results  Component Value Date   WBC 4.7 03/16/2017   HGB 14.0 03/16/2017   HCT 40.8 03/16/2017   PLT 178 03/16/2017   GLUCOSE 96 04/27/2017   CHOL 175 08/27/2015   TRIG 55.0 08/27/2015   HDL 55.90 08/27/2015   LDLDIRECT 113.0 09/16/2014   LDLCALC 108 (H) 08/27/2015   ALT 23 03/16/2017   AST 27 03/16/2017   NA 140 04/27/2017   K 3.9 04/27/2017   CL 101 04/27/2017   CREATININE 0.72 04/27/2017   BUN 20 04/27/2017   CO2 33 (H) 04/27/2017   TSH 0.92 08/27/2015   HGBA1C 5.4 03/23/2017    Mm Screening Breast Tomo Bilateral  Result Date: 04/24/2017 CLINICAL DATA:  Screening. EXAM: 2D DIGITAL SCREENING BILATERAL MAMMOGRAM WITH CAD AND ADJUNCT TOMO COMPARISON:  Previous exam(s). ACR Breast Density Category b: There are scattered areas of fibroglandular density. FINDINGS: There are no findings suspicious for malignancy. Images were processed with CAD. IMPRESSION: No mammographic evidence of malignancy. A result letter of this screening mammogram will be mailed directly to the patient. RECOMMENDATION: Screening mammogram in one year. (Code:SM-B-01Y) BI-RADS CATEGORY  1: Negative.  Electronically Signed   By: Lajean Manes M.D.   On: 04/24/2017 15:34    Assessment & Plan:   Problem List Items Addressed This Visit    Essential hypertension, benign    patient's arm measured and was 34 cm.  Normal sized cuff appropriate, but reassured that a large cuff would also be fine to use since she is borderline. No changes to medication today      Chronic suprapubic pain    She has occasional cramping when bladder is full .       Hypoglycemia    Not proven.  Endocrinology also skeptical.  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  Obesity (BMI 30.0-34.9)    I have addressed  BMI and recommended wt loss of 10% of body weigh over the next 6 months using a low glycemic index diet and regular exercise a minimum of 5 days per week.         A total of 25 minutes of face to face time was spent with patient more than half of which was spent in counselling about the above mentioned conditions  and coordination of care   I have discontinued Gennavieve Alvizo's benzonatate, amoxicillin-clavulanate, and HYDROcodone-homatropine. I am also having her maintain her multivitamin with minerals, Vitamin D-3, Fish Oil, MAGNESIUM MALATE PO, BIOTIN FORTE PO, hydrochlorothiazide, Nutritional Supplements (WOMENS HEALTH SUPPORT PO), BYSTOLIC, pantoprazole, albuterol, and fluticasone. We will continue to administer sodium chloride.  No orders of the defined types were placed in this encounter.   Medications Discontinued During This Encounter  Medication Reason  . amoxicillin-clavulanate (AUGMENTIN) 875-125 MG tablet Completed Course  . benzonatate (TESSALON) 100 MG capsule Completed Course  . HYDROcodone-homatropine (HYCODAN)  5-1.5 MG/5ML syrup Completed Course    Follow-up: No Follow-up on file.   [name], MD

## 2017-07-29 NOTE — Assessment & Plan Note (Signed)
patient's arm measured and was 34 cm.  Normal sized cuff appropriate, but reassured that a large cuff would also be fine to use since she is borderline. No changes to medication today

## 2017-07-29 NOTE — Assessment & Plan Note (Signed)
Her demeanor today is calm, but she continues to endorse multiple somatic complaints with no definitive diagnoses.

## 2017-07-29 NOTE — Assessment & Plan Note (Signed)
She has occasional cramping when bladder is full .

## 2017-07-29 NOTE — Assessment & Plan Note (Signed)
I have addressed  BMI and recommended wt loss of 10% of body weigh over the next 6 months using a low glycemic index diet and regular exercise a minimum of 5 days per week.   

## 2017-07-29 NOTE — Assessment & Plan Note (Addendum)
Not proven.  Endocrinology also skeptical.  Patient was advised to check BSevery 2-3 hours after an eary dinner until morning with glucometer and return to Endocrinology if she gets any reading < 70

## 2017-08-09 ENCOUNTER — Encounter (HOSPITAL_COMMUNITY): Payer: Self-pay

## 2017-08-09 ENCOUNTER — Encounter: Payer: Self-pay | Admitting: Vascular Surgery

## 2017-08-10 ENCOUNTER — Encounter: Payer: Self-pay | Admitting: Vascular Surgery

## 2017-08-10 ENCOUNTER — Encounter (HOSPITAL_COMMUNITY): Payer: Self-pay

## 2017-08-14 ENCOUNTER — Ambulatory Visit: Payer: Self-pay | Admitting: Internal Medicine

## 2017-09-07 ENCOUNTER — Encounter (HOSPITAL_COMMUNITY): Payer: Self-pay

## 2017-09-07 ENCOUNTER — Encounter: Payer: Self-pay | Admitting: Vascular Surgery

## 2017-09-15 ENCOUNTER — Ambulatory Visit (HOSPITAL_COMMUNITY)
Admission: EM | Admit: 2017-09-15 | Discharge: 2017-09-15 | Disposition: A | Discharge status: Home / Self Care | Payer: No Typology Code available for payment source | Attending: Radiology | Admitting: Radiology

## 2017-09-15 ENCOUNTER — Other Ambulatory Visit: Payer: Self-pay

## 2017-09-15 ENCOUNTER — Encounter (HOSPITAL_COMMUNITY): Payer: Self-pay | Admitting: *Deleted

## 2017-09-15 DIAGNOSIS — J069 Acute upper respiratory infection, unspecified: Secondary | ICD-10-CM | POA: Diagnosis not present

## 2017-09-15 DIAGNOSIS — R682 Dry mouth, unspecified: Secondary | ICD-10-CM | POA: Diagnosis not present

## 2017-09-15 DIAGNOSIS — R05 Cough: Secondary | ICD-10-CM

## 2017-09-15 DIAGNOSIS — R6883 Chills (without fever): Secondary | ICD-10-CM

## 2017-09-15 LAB — GLUCOSE, CAPILLARY: GLUCOSE-CAPILLARY: 97 mg/dL (ref 65–99)

## 2017-09-15 MED ORDER — OXYMETAZOLINE HCL 0.05 % NA SOLN: 1.0000 | Freq: Two times a day (BID) | NASAL | 0 refills | Status: DC

## 2017-09-15 MED ORDER — DEXAMETHASONE SODIUM PHOSPHATE 10 MG/ML IJ SOLN
INTRAMUSCULAR | Status: AC
  Filled 2017-09-15: qty 1

## 2017-09-15 MED ORDER — CETIRIZINE HCL 10 MG PO CAPS: 1.0000 | ORAL_CAPSULE | Freq: Every day | ORAL | 0 refills | Status: DC

## 2017-09-15 MED ORDER — DEXAMETHASONE SODIUM PHOSPHATE 10 MG/ML IJ SOLN
10.0000 mg | Freq: Once | INTRAMUSCULAR | Status: AC
  Administered 2017-09-15: 10 mg via INTRAMUSCULAR

## 2017-09-15 MED ORDER — AMOXICILLIN-POT CLAVULANATE 875-125 MG PO TABS: 1.0000 | ORAL_TABLET | Freq: Two times a day (BID) | ORAL | 0 refills | Status: DC

## 2017-09-15 MED ORDER — FLUTICASONE PROPIONATE 50 MCG/ACT NA SUSP: 1.0000 | Freq: Every day | NASAL | 2 refills | Status: DC

## 2017-09-15 NOTE — ED Triage Notes (Signed)
Per pt she came back from from [place] and she thinks she must have ran into someone that had something. Per pt this started last Sunday per pt she had cold like sx

## 2017-09-15 NOTE — ED Provider Notes (Signed)
[facility]    CSN: [id number] Arrival date & time: 09/15/17  1212     History   Chief Complaint Chief Complaint  Patient presents with  . Cough  . Dry throat  . Chills    HPI Nicole Gould is a 57 y.o. female.   57 y.o. female presents with nasal congestion, low grade fever, cough, sore throat, headache sinus pressure X last friday. Condition is acute in nature. Condition is made better by nothing. Condition is made worse at night. Patient denies any relief from over the counter cold and flue medications taken prior to there arrival at this facility.       Past Medical History:  Diagnosis Date  . Chicken pox   . Colon polyps   . Concussion   . Frequent headaches   . GERD (gastroesophageal reflux disease)   . Hypertension   . Migraines   . Pre-eclampsia    with grand mal seizures  . Seizure disorder in pregnancy ([place])   . Seizures ([place])    with pregnancy 1981- pre clampsia     Patient Active Problem List   Diagnosis Date Noted  . Obesity (BMI 30.0-34.9) 07/29/2017  . Sinusitis 07/16/2017  . Edema 04/29/2017  . 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 Hypoglycemia 03/25/2017  . 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 Menopause present, declines hormone replacement therapy 10/25/2014  . Insomnia 05/05/2014  . Anxiety 05/05/2014  . Chronic venous insufficiency 04/21/2014  . Adenomatous polyp of colon 05/12/2013  . Visit for preventive health examination 03/14/2013  . Essential hypertension, benign 01/28/2013  . 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 COLONOSCOPY    . DILATION AND CURETTAGE OF UTERUS  09/08/2014   with hystereoscopy  . POLYPECTOMY    . TONSILLECTOMY    . TUBAL LIGATION      OB History   None      Home Medications    Prior to Admission medications   Medication Sig Start Date End Date Taking?  Authorizing Provider  albuterol (PROVENTIL HFA;VENTOLIN HFA) 108 (90 Base) MCG/ACT inhaler  07/12/17   [provider]  BIOTIN FORTE PO Take by mouth daily.    [provider]  BYSTOLIC 10 MG tablet TAKE 1 TABLET (10 MG TOTAL) BY MOUTH DAILY. 01/08/17   Minna Merritts, MD  Cholecalciferol (VITAMIN D-3) 1000 UNITS CAPS Take 1 capsule by mouth daily.    [provider]  fluticasone (FLONASE) 50 MCG/ACT nasal spray SHAKE LQ AND U 1 SPR IEN D 07/12/17   [provider]  hydrochlorothiazide (HYDRODIURIL) 25 MG tablet Take 0.5 tablets (12.5 mg total) by mouth daily. 10/06/16   [name], MD  MAGNESIUM MALATE PO Take 1 tablet by mouth daily.     [provider]  Multiple Vitamins-Minerals (MULTIVITAMIN WITH MINERALS) tablet Take 1 tablet by mouth daily.    [provider]  Nutritional Supplements ([place]) Take by mouth.    [provider]  Omega-3 Fatty Acids (FISH OIL) 1200 MG CAPS Take 1,200 mg by mouth daily.     [provider]  pantoprazole (PROTONIX) 40 MG tablet Take 1 tablet (40 mg total) by mouth daily before breakfast. 03/09/17   Levin Erp, PA    Family History Family History  Problem Relation Age of Onset  . Hypertension Mother   . Diabetes  Mother   . Heart disease Father   . Kidney disease Father   . Hypertension Father   . Diabetes Father   . Cancer Maternal Grandfather        colon cancer  . Colon cancer Maternal Grandfather        passed age 76   . Colon polyps Neg Hx   . Breast cancer Neg Hx     Social History Social History   Tobacco Use  . Smoking status: Never Smoker  . Smokeless tobacco: Never Used  Substance Use Topics  . Alcohol use: Yes    Comment: very rare - glass red wine when that   . Drug use: No     Allergies   Anesthetics, amide; Benadryl [diphenhydramine hcl (sleep)]; Codeine; Fentanyl; Losartan potassium-hctz; and Prilocaine   Review of  Systems Review of Systems  Constitutional: Positive for chills, diaphoresis and fever.  HENT: Positive for congestion, rhinorrhea, sinus pressure and sore throat. Negative for ear pain.   Eyes: Negative for pain and visual disturbance.  Respiratory: Positive for cough. Negative for shortness of breath.   Cardiovascular: Negative for chest pain and palpitations.  Gastrointestinal: Negative for abdominal pain and vomiting.  Genitourinary: Negative for dysuria and hematuria.  Musculoskeletal: Negative for arthralgias and back pain.  Skin: Negative for color change and rash.  Neurological: Negative for seizures and syncope.  All other systems reviewed and are negative.    Physical Exam Triage Vital Signs ED Triage Vitals [09/15/17 1312]  Enc Vitals Group     BP (!) 153/74     Pulse Rate 62     Resp      Temp      Temp src      SpO2 100 %     Weight      Height      Head Circumference      Peak Flow      Pain Score      Pain Loc      Pain Edu?      Excl. in [place]?    No data found.  Updated Vital Signs BP (!) 153/74 (BP Location: Left Arm)   Pulse 62   LMP 12/01/2013 (Approximate)   SpO2 100%   Visual Acuity Right Eye Distance:   Left Eye Distance:   Bilateral Distance:    Right Eye Near:   Left Eye Near:    Bilateral Near:     Physical Exam  Constitutional: She is oriented to person, place, and time. She appears well-developed and well-nourished.  HENT:  Head: Normocephalic and atraumatic.  Erythema noted to pillars.   Eyes: Conjunctivae are normal.  Neck: Normal range of motion.  Cardiovascular: Normal rate and regular rhythm.  Pulmonary/Chest: Effort normal and breath sounds normal.  Neurological: She is alert and oriented to person, place, and time.  Skin: Skin is warm and dry.  Psychiatric: She has a normal mood and affect.  Nursing note and vitals reviewed.    UC Treatments / Results  Labs (all labs ordered are listed, but only abnormal results are  displayed) Labs Reviewed - No data to display  EKG None Radiology No results found.  Procedures Procedures (including critical care time)  Medications Ordered in UC Medications  dexamethasone (DECADRON) injection 10 mg (has no administration in time range)     Initial Impression / Assessment and Plan / UC Course  I have reviewed the triage vital signs and the nursing notes.  Pertinent labs &  imaging results that were available during my care of the patient were reviewed by me and considered in my medical decision making (see chart for details).       Final Clinical Impressions(s) / UC Diagnoses   Final diagnoses:  None    ED Discharge Orders    None       Controlled Substance Prescriptions [place] Controlled Substance Registry consulted? Not Applicable   Jacqualine Mau, NP 09/15/17 1329

## 2017-09-15 NOTE — Discharge Instructions (Addendum)
Continue to push fluids and take over the counter medications as directed on the back of the box for symptomatic relief.  ° °

## 2017-10-03 ENCOUNTER — Telehealth: Payer: Self-pay | Admitting: Internal Medicine

## 2017-10-03 DIAGNOSIS — E162 Hypoglycemia, unspecified: Secondary | ICD-10-CM

## 2017-10-03 NOTE — Telephone Encounter (Signed)
Please advise 

## 2017-10-03 NOTE — Telephone Encounter (Signed)
Copied from [place] [phone]. Topic: Referral - Request >> Oct 03, 2017 12:28 PM Cleaster Corin, NT wrote: Reason for CRM: pt. Would like to have referral to [facility] asst.  Dr. Chalmers Cater fax# [phone]

## 2017-10-04 ENCOUNTER — Other Ambulatory Visit: Payer: Self-pay

## 2017-10-04 ENCOUNTER — Ambulatory Visit: Payer: Self-pay | Admitting: *Deleted

## 2017-10-04 ENCOUNTER — Ambulatory Visit (HOSPITAL_COMMUNITY)
Admission: EM | Admit: 2017-10-04 | Discharge: 2017-10-04 | Disposition: A | Discharge status: Home / Self Care | Payer: No Typology Code available for payment source | Attending: Family Medicine | Admitting: Family Medicine

## 2017-10-04 ENCOUNTER — Encounter (HOSPITAL_COMMUNITY): Payer: Self-pay | Admitting: Emergency Medicine

## 2017-10-04 DIAGNOSIS — Z885 Allergy status to narcotic agent status: Secondary | ICD-10-CM | POA: Diagnosis not present

## 2017-10-04 DIAGNOSIS — G40409 Other generalized epilepsy and epileptic syndromes, not intractable, without status epilepticus: Secondary | ICD-10-CM | POA: Diagnosis not present

## 2017-10-04 DIAGNOSIS — K219 Gastro-esophageal reflux disease without esophagitis: Secondary | ICD-10-CM | POA: Diagnosis not present

## 2017-10-04 DIAGNOSIS — I10 Essential (primary) hypertension: Secondary | ICD-10-CM | POA: Diagnosis not present

## 2017-10-04 DIAGNOSIS — R5383 Other fatigue: Secondary | ICD-10-CM

## 2017-10-04 DIAGNOSIS — R42 Dizziness and giddiness: Secondary | ICD-10-CM

## 2017-10-04 DIAGNOSIS — E162 Hypoglycemia, unspecified: Secondary | ICD-10-CM | POA: Diagnosis not present

## 2017-10-04 DIAGNOSIS — Z884 Allergy status to anesthetic agent status: Secondary | ICD-10-CM | POA: Diagnosis not present

## 2017-10-04 DIAGNOSIS — R531 Weakness: Secondary | ICD-10-CM

## 2017-10-04 DIAGNOSIS — Z79899 Other long term (current) drug therapy: Secondary | ICD-10-CM | POA: Insufficient documentation

## 2017-10-04 DIAGNOSIS — Z888 Allergy status to other drugs, medicaments and biological substances status: Secondary | ICD-10-CM | POA: Insufficient documentation

## 2017-10-04 LAB — GLUCOSE, CAPILLARY: Glucose-Capillary: 97 mg/dL (ref 65–99)

## 2017-10-04 LAB — CBC WITH DIFFERENTIAL/PLATELET
Basophils Absolute: 0 10*3/uL (ref 0.0–0.1)
Basophils Relative: 0 %
Eosinophils Absolute: 0 10*3/uL (ref 0.0–0.7)
Eosinophils Relative: 1 %
HCT: 39 % (ref 36.0–46.0)
Hemoglobin: 13 g/dL (ref 12.0–15.0)
Lymphocytes Relative: 49 %
Lymphs Abs: 1.5 10*3/uL (ref 0.7–4.0)
MCH: 30.2 pg (ref 26.0–34.0)
MCHC: 33.3 g/dL (ref 30.0–36.0)
MCV: 90.5 fL (ref 78.0–100.0)
Monocytes Absolute: 0.2 10*3/uL (ref 0.1–1.0)
Monocytes Relative: 6 %
Neutro Abs: 1.3 10*3/uL — ABNORMAL LOW (ref 1.7–7.7)
Neutrophils Relative %: 44 %
Platelets: 181 10*3/uL (ref 150–400)
RBC: 4.31 MIL/uL (ref 3.87–5.11)
RDW: 13.9 % (ref 11.5–15.5)
WBC: 3.1 10*3/uL — ABNORMAL LOW (ref 4.0–10.5)

## 2017-10-04 LAB — POCT I-STAT, CHEM 8
BUN: 18 mg/dL (ref 6–20)
CALCIUM ION: 1.13 mmol/L — AB (ref 1.15–1.40)
CHLORIDE: 106 mmol/L (ref 101–111)
Creatinine, Ser: 0.7 mg/dL (ref 0.44–1.00)
GLUCOSE: 109 mg/dL — AB (ref 65–99)
HCT: 42 % (ref 36.0–46.0)
Hemoglobin: 14.3 g/dL (ref 12.0–15.0)
Potassium: 3.5 mmol/L (ref 3.5–5.1)
Sodium: 142 mmol/L (ref 135–145)
TCO2: 27 mmol/L (ref 22–32)

## 2017-10-04 LAB — TSH: TSH: 1.154 u[IU]/mL (ref 0.350–4.500)

## 2017-10-04 LAB — MAGNESIUM: Magnesium: 2.2 mg/dL (ref 1.7–2.4)

## 2017-10-04 NOTE — Telephone Encounter (Signed)
Spoke with pt and informed her that referral has been placed.

## 2017-10-04 NOTE — Telephone Encounter (Signed)
referral to Dr Chalmers Cater in process

## 2017-10-04 NOTE — Telephone Encounter (Signed)
Pt reports "lightheadedness" for months, worsening past week. Severe episodes x 4 yesterday, intermittent, positional at times, resolved with lying down. "Very mild" presently. Denies headache, CP, SOB, congestion; no visual changes. States dx 08/2017 with hypoglycemia. CBG yesterday 98 after drinking orange juice; 110 this am; had HS snack. Reports B/P with episode yesterday 170/70 HR 64, 155/72 after "resting." During call B/P 178/76.Marland KitchenMarland KitchenHRR 63. Also reports mild tremors of "legs and brain" sometimes during night. No openings with Dr. [name] today or other provider at practice. Spoke with Patina; directed pt to UC/ED. Pt will go to ED, husband will drive.  Reason for Disposition . [1] MODERATE dizziness (e.g., interferes with normal activities) AND [2] has NOT been evaluated by physician for this  (Exception: dizziness caused by heat exposure, sudden standing, or poor fluid intake)  Answer Assessment - Initial Assessment Questions 1. DESCRIPTION: "Describe your dizziness."     Lightheaded, feels like I'll pass out. Not presently. 2. LIGHTHEADED: "Do you feel lightheaded?" (e.g., somewhat faint, woozy, weak upon standing)     yes 3. VERTIGO: "Do you feel like either you or the room is spinning or tilting?" (i.e. vertigo)     No 4. SEVERITY: "How bad is it?"  "Do you feel like you are going to faint?" "Can you stand and walk?"   - MILD - walking normally   - MODERATE - interferes with normal activities (e.g., work, school)    - SEVERE - unable to stand, requires support to walk, feels like passing out now.      Intermittent, moderate to severe when occurs. 4 times yesterday, severe.    "Had to lie down." 5. ONSET:  "When did the dizziness begin?"     1 week ago worsening symptoms 6. AGGRAVATING FACTORS: "Does anything make it worse?" (e.g., standing, change in head position)     Positional, worse sitting to standing; resolves when lying down. 7. HEART RATE: "Can you tell me your heart rate?" "How  many beats in 15 seconds?"  (Note: not all patients can do this)       63 8. CAUSE: "What do you think is causing the dizziness?"     Maybe low blood sugar, or blood pressure 9. RECURRENT SYMPTOM: "Have you had dizziness before?" If so, ask: "When was the last time?" "What happened that time?"     Been dizzy for  Months, getting worse past week. "Yesterday was the worst." 10. OTHER SYMPTOMS: "Do you have any other symptoms?" (e.g., fever, chest pain, vomiting, diarrhea, bleeding)       Fatigue, "Tremors of legs and brain at night." 11. PREGNANCY: "Is there any chance you are pregnant?" "When was your last menstrual period?"       no  Protocols used: DIZZINESS [facility]

## 2017-10-04 NOTE — Telephone Encounter (Signed)
This is not a [facility] patient. This is a [facility] patient.

## 2017-10-04 NOTE — Telephone Encounter (Signed)
Pt would like a second opinion on her diabetes and why her sugars keep dropping. Pt stated that she had an episode on Sunday and Wednesday. The episode on Sunday caused her to get sick and weak not allowing her to be able to work on Monday. Then the episode yesterday had her feeling like she was about to pass out. The pt stated that she has an appt with Dr. Gabriel Carina on Monday but would still like to see Dr. Chalmers Cater for a second opinion. Pt also stated that she is at [facility] right now having some blood work done and an EKG done because of the episode that she had yesterday. The doctor at the urgent care told her that her PCP would be the one that would contact her about her lab work so the pt just wanted to give you heads up.

## 2017-10-04 NOTE — Telephone Encounter (Signed)
Pt was seen today @ 10:50am

## 2017-10-04 NOTE — ED Provider Notes (Signed)
[place]    CSN: [id number] Arrival date & time: 10/04/17  1050     History   Chief Complaint Chief Complaint  Patient presents with  . Hypoglycemia    HPI Nicole Gould is a 57 y.o. female.   57 year old female, with history of frequent headaches, GERD, hypertension, recurrent hypoglycemia, PVCs, presenting today complaining of presyncope.  Patient states that she had weakness throughout the day yesterday with 4 episodes of lightheadedness and presyncope.  She never had a syncopal episode.  States that she feels very weak today.  She states that this is happened in the past with her episodes of hypoglycemia.  States that when she got home yesterday from work, she had a recurrent episode of lightheadedness at which time she drank orange juice.  States that about 30 minutes later, she rechecked her blood sugar and it was 93.  She states that these are her normal symptoms of hypoglycemia aside from the lightheadedness.  States that she was unable to see her primary care doctor today which is what prompted her visit to the urgent care.  She denies any associated chest pain or shortness of breath.  The history is provided by the patient.  Hypoglycemia  Initial blood sugar:  Unknown  Blood sugar after intervention:  93 Severity:  Mild Onset quality:  Gradual Duration: few minutes  Timing:  Intermittent Progression:  Unchanged Chronicity:  Recurrent Diabetic status:  Non-diabetic Context: not decreased oral intake, not diet changes, not exercise, not ingestion, not new diabetes diagnosis, not recent illness and not treatment noncompliance   Ineffective treatments:  Eating Associated symptoms: dizziness and weakness   Associated symptoms: no altered mental status, no anxiety, no decreased responsiveness, no obesity, no seizures, no shortness of breath, no speech difficulty, no sweats, no tremors, no visual change and no vomiting   Risk factors: no alcohol abuse, no  cancer, no chronic liver disease and no drug use     Past Medical History:  Diagnosis Date  . Chicken pox   . Colon polyps   . Concussion   . Frequent headaches   . GERD (gastroesophageal reflux disease)   . Hypertension   . Migraines   . Pre-eclampsia    with grand mal seizures  . Seizure disorder in pregnancy ([place])   . Seizures ([place])    with pregnancy 1981- pre clampsia     Patient Active Problem List   Diagnosis Date Noted  . Obesity (BMI 30.0-34.9) 07/29/2017  . Sinusitis 07/16/2017  . Edema 04/29/2017  . 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 Hypoglycemia 03/25/2017  . 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 Menopause present, declines hormone replacement therapy 10/25/2014  . Insomnia 05/05/2014  . Anxiety 05/05/2014  . Chronic venous insufficiency 04/21/2014  . Adenomatous polyp of colon 05/12/2013  . Visit for preventive health examination 03/14/2013  . Essential hypertension, benign 01/28/2013  . 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 COLONOSCOPY    . DILATION AND CURETTAGE OF UTERUS  09/08/2014   with hystereoscopy  . POLYPECTOMY    . TONSILLECTOMY    . TUBAL LIGATION      OB History   None      Home Medications    Prior to Admission medications   Medication Sig Start Date End Date Taking? Authorizing Provider  amLODipine (NORVASC) 5 MG tablet Take 5 mg by mouth daily.  Yes [provider]  albuterol (PROVENTIL HFA;VENTOLIN HFA) 108 (90 Base) MCG/ACT inhaler  07/12/17   [provider]  amoxicillin-clavulanate (AUGMENTIN) 875-125 MG tablet Take 1 tablet by mouth every 12 (twelve) hours. 09/15/17   Jacqualine Mau, NP  BIOTIN FORTE PO Take by mouth daily.    [provider]  BYSTOLIC 10 MG tablet TAKE 1 TABLET (10 MG TOTAL) BY MOUTH DAILY. 01/08/17   Minna Merritts, MD  Cetirizine HCl (ZYRTEC  ALLERGY) 10 MG CAPS Take 1 capsule (10 mg total) by mouth daily. 09/15/17   Jacqualine Mau, NP  Cholecalciferol (VITAMIN D-3) 1000 UNITS CAPS Take 1 capsule by mouth daily.    [provider]  fluticasone (FLONASE) 50 MCG/ACT nasal spray SHAKE LQ AND U 1 SPR IEN D 07/12/17   [provider]  fluticasone (FLONASE) 50 MCG/ACT nasal spray Place 1 spray into both nostrils daily. 09/15/17   Jacqualine Mau, NP  hydrochlorothiazide (HYDRODIURIL) 25 MG tablet Take 0.5 tablets (12.5 mg total) by mouth daily. 10/06/16   [name], MD  MAGNESIUM MALATE PO Take 1 tablet by mouth daily.     [provider]  Multiple Vitamins-Minerals (MULTIVITAMIN WITH MINERALS) tablet Take 1 tablet by mouth daily.    [provider]  Nutritional Supplements (Page) Take by mouth.    [provider]  Omega-3 Fatty Acids (FISH OIL) 1200 MG CAPS Take 1,200 mg by mouth daily.     [provider]  oxymetazoline (AFRIN NASAL SPRAY) 0.05 % nasal spray Place 1 spray into both nostrils 2 (two) times daily. 09/15/17   Jacqualine Mau, NP  pantoprazole (PROTONIX) 40 MG tablet Take 1 tablet (40 mg total) by mouth daily before breakfast. 03/09/17   Levin Erp, PA    Family History Family History  Problem Relation Age of Onset  . Hypertension Mother   . Diabetes Mother   . Heart disease Father   . Kidney disease Father   . Hypertension Father   . Diabetes Father   . Cancer Maternal Grandfather        colon cancer  . Colon cancer Maternal Grandfather        passed age 57   . Colon polyps Neg Hx   . Breast cancer Neg Hx     Social History Social History   Tobacco Use  . Smoking status: Never Smoker  . Smokeless tobacco: Never Used  Substance Use Topics  . Alcohol use: Yes    Comment: very rare - glass red wine when that   . Drug use: No     Allergies   Anesthetics, amide; Benadryl [diphenhydramine hcl (sleep)];  Codeine; Fentanyl; Losartan potassium-hctz; and Prilocaine   Review of Systems Review of Systems  Constitutional: Negative for chills, decreased responsiveness, diaphoresis and fever.  HENT: Negative for ear pain and sore throat.   Eyes: Negative for pain and visual disturbance.  Respiratory: Negative for cough and shortness of breath.   Cardiovascular: Negative for chest pain and palpitations.  Gastrointestinal: Negative for abdominal pain and vomiting.  Genitourinary: Negative for dysuria and hematuria.  Musculoskeletal: Negative for arthralgias and back pain.  Skin: Negative for color change and rash.  Neurological: Positive for dizziness, weakness and light-headedness. Negative for tremors, seizures, syncope and speech difficulty.  Psychiatric/Behavioral: The patient is not nervous/anxious.   All other systems reviewed and are negative.    Physical Exam Triage Vital Signs ED Triage Vitals  Enc  Vitals Group     BP 10/04/17 1117 138/67     Pulse Rate 10/04/17 1117 63     Resp --      Temp 10/04/17 1117 98.1 F (36.7 C)     Temp Source 10/04/17 1114 Oral     SpO2 10/04/17 1117 100 %     Weight --      Height --      Head Circumference --      Peak Flow --      Pain Score 10/04/17 1112 0     Pain Loc --      Pain Edu? --      Excl. in [place]? --    Orthostatic VS for the past 24 hrs:  BP- Lying Pulse- Lying BP- Sitting Pulse- Sitting BP- Standing at 0 minutes Pulse- Standing at 0 minutes  10/04/17 1223 156/65 60 136/68 63 129/71 67    Updated Vital Signs BP 138/67 (BP Location: Left Arm)   Pulse 63   Temp 98.1 F (36.7 C) (Oral)   LMP 12/01/2013 (Approximate)   SpO2 100%   Visual Acuity Right Eye Distance:   Left Eye Distance:   Bilateral Distance:    Right Eye Near:   Left Eye Near:    Bilateral Near:     Physical Exam  Constitutional: She is oriented to person, place, and time. She appears well-developed and well-nourished. No distress.  HENT:  Head:  Normocephalic and atraumatic.  Right Ear: Hearing, tympanic membrane, external ear and ear canal normal.  Left Ear: Hearing, tympanic membrane, external ear and ear canal normal.  Nose: Nose normal.  Mouth/Throat: Oropharynx is clear and moist. No oropharyngeal exudate, posterior oropharyngeal edema, posterior oropharyngeal erythema or tonsillar abscesses.  Eyes: Pupils are equal, round, and reactive to light. Conjunctivae and EOM are normal.  Neck: Normal range of motion. Neck supple.  Cardiovascular: Normal rate and regular rhythm.  No murmur heard. Pulmonary/Chest: Effort normal and breath sounds normal. No stridor. No respiratory distress. She has no wheezes. She has no rhonchi. She has no rales.  Abdominal: Soft. There is no tenderness.  Musculoskeletal: Normal range of motion. She exhibits no edema.  Neurological: She is alert and oriented to person, place, and time. She has normal strength and normal reflexes. No cranial nerve deficit or sensory deficit. She displays a negative Romberg sign. GCS eye subscore is 4. GCS verbal subscore is 5. GCS motor subscore is 6.  Skin: Skin is warm and dry. She is not diaphoretic.  Psychiatric: She has a normal mood and affect.  Nursing note and vitals reviewed.    UC Treatments / Results  Labs (all labs ordered are listed, but only abnormal results are displayed) Labs Reviewed  CBC WITH DIFFERENTIAL/PLATELET - Abnormal; Notable for the following components:      Result Value   WBC 3.1 (*)    Neutro Abs 1.3 (*)    All other components within normal limits  POCT I-STAT, CHEM 8 - Abnormal; Notable for the following components:   Glucose, Bld 109 (*)    Calcium, Ion 1.13 (*)    All other components within normal limits  GLUCOSE, CAPILLARY  MAGNESIUM  TSH    EKG None Radiology No results found.  Procedures Procedures (including critical care time)  Medications Ordered in UC Medications - No data to display   Initial Impression /  Assessment and Plan / UC Course  I have reviewed the triage vital signs and the nursing notes.  Pertinent labs & imaging results that were available during my care of the patient were reviewed by me and considered in my medical decision making (see chart for details).  Clinical Course as of Oct 04 1312  Thu Oct 04, 2017  1239 Calcium Ionized(!): 1.13 [OB]    Clinical Course User Index [OB] [place], Belenda Cruise, [place]    Patient presenting today for weakness and multiple episodes of lightheadedness and presyncope yesterday.  Patient does have history of hypoglycemia.  Patient is requesting full work-up in the urgent care today including labs.  EKG also performed as the patient has a history of PVCs.  Orthostatic vital signs also ordered which were negative Patient does have a primary care provider.  She understands that labs drawn today will not result immediately and she will follow-up with her results with her primary care doctor. Mild hypocalcemia on I-stat. She is aware of this and will try calcium supplementation  Final Clinical Impressions(s) / UC Diagnoses   Final diagnoses:  Lightheadedness  Hypoglycemia    ED Discharge Orders    None       Controlled Substance Prescriptions [place] Controlled Substance Registry consulted? Not Applicable   Phebe Colla, [place] 10/04/17 1314

## 2017-10-04 NOTE — ED Triage Notes (Signed)
States has Hx of hypoglycemic, c/o feeling dizzy, fatigue and "felt like going to faint" last PM

## 2017-10-08 ENCOUNTER — Telehealth: Payer: Self-pay | Admitting: Cardiovascular Disease

## 2017-10-08 NOTE — Telephone Encounter (Signed)
Please advise if you would like to give samples to patient.

## 2017-10-08 NOTE — Telephone Encounter (Signed)
Medication Samples have been provided to the patient with a co-pay card and patient assistant form.   Drug name: Bystolic       Strength: 10MG         Qty: 2 bottles  LOT: [id number]  Exp.Date: 03/20  Janan Ridge 1:46 PM 10/08/2017   Spoke with patient. She was very upset that I was only able to give her 2 bottles of Bystolic 10MG . She stated "I've always received a generous amount and Dr. Rockey Situ told me I could get samples at anytime." "I hate to tell you and not to be funny but two bottles will not help me." I asked her if she would like me to place them up front and she stated "yes but I will need more than that." "This medication is to expensive and Dr. Rockey Situ told me since it was going to be so expensive I could get all the samples I needed."   I will place these samples, co-pay card, and patient assistant form up front but patient way need a call back.

## 2017-10-08 NOTE — Telephone Encounter (Signed)
Pt would like samples of Bystolic

## 2017-10-08 NOTE — Telephone Encounter (Signed)
We only use samples for new start patients so we could provide her with some and then assistance application.

## 2017-10-09 NOTE — Telephone Encounter (Signed)
Pt called - she states that specialist did not receive the referral. She is asking for it to be faxed again - [phone] attn: omega

## 2017-10-09 NOTE — Telephone Encounter (Signed)
Referral has been faxed to number listed [phone] attn: [facility] as directed.

## 2017-10-11 ENCOUNTER — Telehealth: Payer: Self-pay | Admitting: Cardiovascular Disease

## 2017-10-11 NOTE — Telephone Encounter (Signed)
S/w patient. She's had a few episodes of lightheadedness to the point of feeling like she may pass out. She says she felt fine until the end of March and since then keeps having these occurrences. She has history of hypoglycemia and this would usually happen when her blood sugar would get low. However, in this circumstance, her blood sugar hasn't been low when it happened. She goes to see a new endocrinologist on 4/30 but would also like to see Dr Rockey Situ. Her last office visit was 06/2016. Scheduled patient to see him this Monday, 10/15/17. She was very Patent attorney.

## 2017-10-11 NOTE — Telephone Encounter (Signed)
Pt states she has been feeling dizzy, and has had a couple episodes she felt as if she was going to pass out. States she had one of these episodes this morning. Also states she has felt lightheaded and really tired. States Dr. [name] as put her on Norvasc, states this is when the lightheadedness started. Please call to discuss.

## 2017-10-13 DIAGNOSIS — R55 Syncope and collapse: Secondary | ICD-10-CM | POA: Insufficient documentation

## 2017-10-13 NOTE — Progress Notes (Signed)
Cardiology Office Note  Date:  10/15/2017   ID:  Nicole Gould Gould, DOB 1960-09-29, MRN [id number]  PCP:  Nicole Gould Mc, MD   Chief Complaint  Patient presents with  . other    12 month follow up. Pt. c/o dizziness and felt like could blackout. Pt. c/o LE edema since the amlodipine started & is having a lot of problems with her blood sugar. Meds reviewed by the pt. verbally.     HPI:  [name] Gould is a 57 year old woman with history of  palpitations,  hypertension  who presents for follow-up of her Blood pressure, new episodes of dizziness  Reports that for the past several months she has had chronic dizziness Seems to happen frequently,  Etiology unclear at this time Has been to the emergency room several times with negative work-up  On previous ER visit her dizziness, BP 401U systolic, Hospital records reviewed with the patient in detail Orthostatic negative Low calcium Glucose 109  Reports having 2 episodes of near syncope One episode was driving looked back behind her when she acutely became dizzy Symptoms were quick Described it like she had a "whipping in my head" Seemed to linger  Also reports having some tremors in her head  Saw Dr. Della Gould, HGB A1C 5.4 up to 5.7 Told to take arbose BID, has not started this yet  Started on amlodipine 5 a few months ago Wonders if this caused the dizziness Worsening swelling , would like to change medications Wonders about low blood sugar  Poor sleep, feels hot on and off feels she is having menopause Scheduled to see OB/GYN  On prior office visits had occasional chest tightness  Previous side effects on losartan, amlodipine Atenolol did not help her palpitations, changed to bystolic  Orthostatics negative on her visit today Blood pressure is 150s  EKG personally reviewed by myself on todays visit Shows normal sinus rhythm rate 62 bpm no significant ST or T wave changes  Other past medical  history Previously started on losartan and she was on this for 1 week but reported having significant side effects of general malaise This was held and she was started on amlodipine which she only took for 1 day as this caused a headache  back on HCTZ one half pill and atenolol 25 mg daily. She's had been on this combination for a long time   lab work showing total cholesterol 183, LDL 106. This was reviewed with her   Myoview in 2005 for ectopy that was reportedly normal. She attributes her symptoms to menopause. Menopause symptoms have been getting worse over the past several years. Now with sweating, occasional palpitations, malaise.   history of concussion in April 2014 and was evaluated by Nicole Gould Gould at [facility].  The injury occurred when she fell out of a chair and a metal bar on chair hit her in the back of head    Seeing a headache specialist in at [place]  Nicole Gould Gould of CAD in father .   PMH:   has a past medical history of Chicken pox, Colon polyps, Concussion, Frequent headaches, GERD (gastroesophageal reflux disease), Hypertension, Migraines, Pre-eclampsia, Seizure disorder in pregnancy ([place]), and Seizures (Nicole Gould Gould).  PSH:    Past Surgical History:  Procedure Laterality Date  . COLONOSCOPY    . DILATION AND CURETTAGE OF UTERUS  09/08/2014   with hystereoscopy  . POLYPECTOMY    . TONSILLECTOMY    . TUBAL LIGATION      Current Outpatient Medications  Medication Sig  Dispense Refill  . albuterol (PROVENTIL HFA;VENTOLIN HFA) 108 (90 Base) MCG/ACT inhaler     . amoxicillin-clavulanate (AUGMENTIN) 875-125 MG tablet Take 1 tablet by mouth every 12 (twelve) hours. 14 tablet 0  . BIOTIN FORTE PO Take by mouth daily.    . Cetirizine HCl (ZYRTEC ALLERGY) 10 MG CAPS Take 1 capsule (10 mg total) by mouth daily. 30 capsule 0  . Cholecalciferol (VITAMIN D-3) 1000 UNITS CAPS Take 1 capsule by mouth daily.    . fluticasone (FLONASE) 50 MCG/ACT nasal spray SHAKE LQ AND U 1 SPR IEN D  0  . fluticasone  (FLONASE) 50 MCG/ACT nasal spray Place 1 spray into both nostrils daily. 16 g 2  . hydrochlorothiazide (HYDRODIURIL) 25 MG tablet Take 0.5 tablets (12.5 mg total) by mouth daily. 90 tablet 3  . MAGNESIUM MALATE PO Take 1 tablet by mouth daily.     . Multiple Vitamins-Minerals (MULTIVITAMIN WITH MINERALS) tablet Take 1 tablet by mouth daily.    . nebivolol (BYSTOLIC) 10 MG tablet Take 1 tablet (10 mg total) by mouth daily. 90 tablet 3  . Nutritional Supplements (WOMENS HEALTH SUPPORT PO) Take by mouth.    . Omega-3 Fatty Acids (FISH OIL) 1200 MG CAPS Take 1,200 mg by mouth daily.     Marland Kitchen oxymetazoline (AFRIN NASAL SPRAY) 0.05 % nasal spray Place 1 spray into both nostrils 2 (two) times daily. 30 mL 0  . pantoprazole (PROTONIX) 40 MG tablet Take 1 tablet (40 mg total) by mouth daily before breakfast. 90 tablet 3  . doxazosin (CARDURA) 2 MG tablet Take 1 tablet (2 mg total) by mouth 2 (two) times daily as needed (Twice daily and as needed.). 180 tablet 3   Current Facility-Administered Medications  Medication Dose Route Frequency Provider Last Rate Last Dose  . 0.9 %  sodium chloride infusion  500 mL Intravenous Continuous Nicole Gould Gould, Nicole Gould Lines, MD         Allergies:   Anesthetics, amide; Benadryl [diphenhydramine hcl (sleep)]; Codeine; Fentanyl; Losartan potassium-hctz; and Prilocaine   Social History:  The patient  reports that she has never smoked. She has never used smokeless tobacco. She reports that she drinks alcohol. She reports that she does not use drugs.   Family History:   family history includes Cancer in her maternal grandfather; Colon cancer in her maternal grandfather; Diabetes in her father and mother; Heart disease in her father; Hypertension in her father and mother; Kidney disease in her father.    Review of Systems: Review of Systems  Constitutional: Negative.   Respiratory: Negative.   Cardiovascular: Positive for leg swelling.  Gastrointestinal: Negative.   Musculoskeletal:  Negative.   Neurological: Positive for dizziness.  Psychiatric/Behavioral: Negative.   All other systems reviewed and are negative.    PHYSICAL EXAM: VS:  BP 132/68 (BP Location: Left Arm, Patient Position: Sitting, Cuff Size: Large)   Pulse 62   Ht 5\' 4"  (1.626 m)   Wt 203 lb 8 oz (92.3 kg)   LMP 12/01/2013 (Approximate)   BMI 34.93 kg/m  , BMI Body mass index is 34.93 kg/m. Constitutional:  oriented to person, place, and time. No distress.  HENT:  Head: Normocephalic and atraumatic.  Eyes:  no discharge. No scleral icterus.  Neck: Normal range of motion. Neck supple. No JVD present.  Cardiovascular: Normal rate, regular rhythm, normal heart sounds and intact distal pulses. Exam reveals no gallop and no friction rub. No edema No murmur heard. Pulmonary/Chest: Effort normal and breath sounds normal.  No stridor. No respiratory distress.  no wheezes.  no rales.  no tenderness.  Abdominal: Soft.  no distension.  no tenderness.  Musculoskeletal: Normal range of motion.  no  tenderness or deformity.  Neurological:  normal muscle tone. Coordination normal. No atrophy Skin: Skin is warm and dry. No rash noted. not diaphoretic.  Psychiatric:  normal mood and affect. behavior is normal. Thought content normal.    Recent Labs: 03/16/2017: ALT 23 10/04/2017: BUN 18; Creatinine, Ser 0.70; Hemoglobin 14.3; Magnesium 2.2; Platelets 181; Potassium 3.5; Sodium 142; TSH 1.154    Lipid Panel Lab Results  Component Value Date   CHOL 175 08/27/2015   HDL 55.90 08/27/2015   LDLCALC 108 (H) 08/27/2015   TRIG 55.0 08/27/2015      Wt Readings from Last 3 Encounters:  10/15/17 203 lb 8 oz (92.3 kg)  07/27/17 195 lb 3.2 oz (88.5 kg)  07/16/17 195 lb 3.2 oz (88.5 kg)       ASSESSMENT AND PLAN:  Essential hypertension, benign Long discussion concerning her hypertension Tolerating Bystolic She is somewhat tolerating HCTZ but bothered by polyuria Does not feel she is tolerating  amlodipine.  Wonders if it could be causing dizziness and leg swelling Reactions to losartan in the past Recommended she stop the amlodipine and we will start very slowly on Cardura 1 mg daily with slow titration upwards if needed for hypertension Other options for medications include clonidine but she already has a dry mouth Could try isosorbide or hydralazine but less convenient  Palpitations symptoms well controlled on bystolic  we will continue current dose 10 mg  Anxiety Not sleeping well Likely contributing to her chronic fatigue Not tolerating heavy workload at this time  Insomnia Etiology unclear, leading to chronic fatigue in the daytime Unable to handle busy workload  Near syncope Two significant episode she reports particularly last week Etiology unclear.  Unable to definitively exclude low sugars but she reports having one episode after she had eaten apple and peanut butter 20 to 30 minutes before Unable to exclude arrhythmia and we did offer event monitor.  She will monitor her pulse rate and call us if she notices something abnormal  Dizziness Etiology unclear, will stop the amlodipine in hopes that this will help her symptoms Does not appear to be from low sugar or low blood pressure We did discuss arrhythmia and might need Event monitor if symptoms persist She will monitor heart rate   Total encounter time more than 45 minutes  Greater than 50% was spent in counseling and coordination of care with the patient   Disposition:   F/U  12 months as needed   Orders Placed This Encounter  Procedures  . EKG 12-Lead     Signed, Esmond Plants, M.D., Ph.D. 10/15/2017  [place], [place]

## 2017-10-14 ENCOUNTER — Other Ambulatory Visit: Payer: Self-pay | Admitting: Cardiovascular Disease

## 2017-10-15 ENCOUNTER — Encounter: Payer: Self-pay | Admitting: Cardiovascular Disease

## 2017-10-15 ENCOUNTER — Encounter: Payer: Self-pay | Admitting: *Deleted

## 2017-10-15 ENCOUNTER — Ambulatory Visit (INDEPENDENT_AMBULATORY_CARE_PROVIDER_SITE_OTHER): Payer: No Typology Code available for payment source | Admitting: Cardiovascular Disease

## 2017-10-15 VITALS — BP 132/68 | HR 62 | Ht 64.0 in | Wt 203.5 lb

## 2017-10-15 DIAGNOSIS — R55 Syncope and collapse: Secondary | ICD-10-CM

## 2017-10-15 DIAGNOSIS — I10 Essential (primary) hypertension: Secondary | ICD-10-CM

## 2017-10-15 DIAGNOSIS — R42 Dizziness and giddiness: Secondary | ICD-10-CM

## 2017-10-15 DIAGNOSIS — G4700 Insomnia, unspecified: Secondary | ICD-10-CM | POA: Diagnosis not present

## 2017-10-15 DIAGNOSIS — E162 Hypoglycemia, unspecified: Secondary | ICD-10-CM | POA: Diagnosis not present

## 2017-10-15 MED ORDER — NEBIVOLOL HCL 10 MG PO TABS: 10.0000 mg | ORAL_TABLET | Freq: Every day | ORAL | 3 refills | Status: DC

## 2017-10-15 MED ORDER — DOXAZOSIN MESYLATE 2 MG PO TABS: 2.0000 mg | ORAL_TABLET | Freq: Two times a day (BID) | ORAL | 3 refills | Status: DC | PRN

## 2017-10-15 NOTE — Patient Instructions (Addendum)
Medication Instructions:   Please stop the amlodipine  Please start cardura/doxasozin 1/2 pill daily (1 mg) Increase up to whole pill 2 mg daily  If blood pressure runs high, We may need 4 mg daily  Max dose 8 mg up to 8 twice a day   Labwork:  No new labs needed  Testing/Procedures:  No further testing at this time   Follow-Up: It was a pleasure seeing you in the office today. Please call us if you have new issues that need to be addressed before your next appt.  [phone]  Your physician wants you to follow-up in: as needed  If you need a refill on your cardiac medications before your next appointment, please call your pharmacy.  For educational health videos Log in to : [email or web address] Or : [email or web address], password : [email or web address]

## 2017-10-16 LAB — BASIC METABOLIC PANEL
BUN: 17 (ref 4–21)
CREATININE: 0.8 (ref 0.5–1.1)
Glucose: 84
POTASSIUM: 3.7 (ref 3.4–5.3)
Sodium: 147 (ref 137–147)

## 2017-10-16 LAB — HEMOGLOBIN A1C: Hemoglobin A1C: 5.9 % — AB (ref 4.0–5.6)

## 2017-10-16 NOTE — Addendum Note (Signed)
Addended by: Minna Merritts on: 10/16/2017 01:46 PM   Modules accepted: Level of Service

## 2017-10-25 ENCOUNTER — Encounter (HOSPITAL_COMMUNITY): Payer: Self-pay | Admitting: Family Medicine

## 2017-10-25 ENCOUNTER — Telehealth: Payer: Self-pay | Admitting: Internal Medicine

## 2017-10-25 ENCOUNTER — Ambulatory Visit: Payer: Self-pay

## 2017-10-25 ENCOUNTER — Ambulatory Visit (HOSPITAL_COMMUNITY)
Admission: EM | Admit: 2017-10-25 | Discharge: 2017-10-25 | Disposition: A | Discharge status: Home / Self Care | Payer: No Typology Code available for payment source | Attending: Family Medicine | Admitting: Family Medicine

## 2017-10-25 DIAGNOSIS — R42 Dizziness and giddiness: Secondary | ICD-10-CM

## 2017-10-25 LAB — GLUCOSE, CAPILLARY: GLUCOSE-CAPILLARY: 86 mg/dL (ref 65–99)

## 2017-10-25 NOTE — Telephone Encounter (Signed)
Spoke with pt and she was seen at [facility] today to lightheadedness and feeling like she was going to pass out. They told that she would need to follow up with you in 3 days. The pt stated that she is off on Monday and would like to be seen then. Is it okay to schedule pt at 5:00pm on Monday?

## 2017-10-25 NOTE — Telephone Encounter (Signed)
Copied from [place] [id number]. Topic: Appointment Scheduling - Scheduling Inquiry for Clinic >> Oct 25, 2017  1:32 PM Rutherford Nail, [place] wrote: Reason for CRM: patient calling back after speaking with Nurse triage. NT advised her to follow up within 3 days. Patient had to get off phone to be seen at Urgent Care before appointment was scheduled.  Dr. [name] does not have anything available until June. Offered her an appointment with [place] and she refused. Stated she only wants to see Dr [name]. Please advise. CB#: [phone]

## 2017-10-25 NOTE — ED Triage Notes (Addendum)
Pt here for dizziness that started today while she was at work. Denies the sensation of the room spinning, just feeling light headed as if she may pass out. sts that it is intermittent. She feels some congestion in head. Pt started taking metformin monday

## 2017-10-25 NOTE — Telephone Encounter (Signed)
yes

## 2017-10-25 NOTE — Discharge Instructions (Signed)
Please follow-up with Dr. [name] and your endocrinologist  Please continue to drink plenty of water and monitor your blood sugar ever symptoms develop

## 2017-10-25 NOTE — Telephone Encounter (Signed)
Spoke with pt and she is scheduled for Monday at 5pm. Pt is aware of appt date and time.

## 2017-10-25 NOTE — Telephone Encounter (Signed)
Pt called c/o 3 week intermittent h/o feeling lightheaded, "woozy", feeling "jittery" and "off balance."  Pt stated that she is walking normally. Pt stated that she can turn her head and will sometimes cause the sx to start. Pt stated that Dr. [name] has evaluated her in the past for this.  Pt was calling from an [facility] and was trying to get an appointment. Pt had to disconnect call because she was about to be seen by a provider at the [facility]. Asked ot to call back when she is through with her visit at [facility] to make an appt.  Reason for Disposition . [1] MODERATE dizziness (e.g., interferes with normal activities) AND [2] has been evaluated by physician for this  Answer Assessment - Initial Assessment Questions 1. DESCRIPTION: "Describe your dizziness."     Feel woozy, dizzy 2. LIGHTHEADED: "Do you feel lightheaded?" (e.g., somewhat faint, woozy, weak upon standing)     Woozy, weak, feels jittery balance is off intermittently, somewhat faint 3. VERTIGO: "Do you feel like either you or the room is spinning or tilting?" (i.e. vertigo)     no 4. SEVERITY: "How bad is it?"  "Do you feel like you are going to faint?" "Can you stand and walk?"   - MILD - walking normally   - MODERATE - interferes with normal activities (e.g., work, school)    - SEVERE - unable to stand, requires support to walk, feels like passing out now.  mild 5. ONSET:  "When did the dizziness begin?"    3 weeks 6. AGGRAVATING FACTORS: "Does anything make it worse?" (e.g., standing, change in head position)   Turning head and driving 7. HEART RATE: "Can you tell me your heart rate?" "How many beats in 15 seconds?"  (Note: not all patients can do this)       65 per minute 8. CAUSE: "What do you think is causing the dizziness?"     Reactive hypoglycemia 9. RECURRENT SYMPTOM: "Have you had dizziness before?" If so, ask: "When was the last time?" "What happened that time?"     Yes - last year Dr [name] saw pt for this  10. OTHER  SYMPTOMS: "Do you have any other symptoms?" (e.g., fever, chest pain, vomiting, diarrhea, bleeding)       no 11. PREGNANCY: "Is there any chance you are pregnant?" "When was your last menstrual period?"       N/a- in Menopause  Protocols used: DIZZINESS Heidi Dach

## 2017-10-25 NOTE — Telephone Encounter (Signed)
Can patient be worked in with Dr. [name] sooner. Please advise.

## 2017-10-26 NOTE — ED Provider Notes (Signed)
[place]    CSN: [id number] Arrival date & time: 10/25/17  1119     History   Chief Complaint Chief Complaint  Patient presents with  . Dizziness    HPI Nicole Gould is a 57 y.o. female  history of hypertension and prediabetic presenting today for evaluation of lightheadedness/dizziness.  Patient states that earlier today while she was at work she felt lightheaded, around 10:00 symptoms worsened and felt like she was about to pass out.  Also associated with jitteriness and weakness.  She tried to eat a snack of a weight watchers flat out bread and pepperoni and cheese.  Symptoms did not improve.  She went to her car to lie down and again felt worsening of her symptoms as if you develop the past out.  Denies any syncope.  Patient notes that over the past couple months the symptoms have been happening quite frequently.  She has never passed out.  She states that these episodes feel as if her blood sugars dropping.  She previously had issues with hypoglycemia where her blood sugar would be in the 40s have similar symptoms.  Patient has recently been seeing an endocrinologist for prediabetes and recently started metformin on Monday.  Symptoms did not worsen since initiation of metformin.  More recently when these episodes happen her blood sugars have been in the 90s, today in the 80s.  States her endocrinologist feels that her body may react in a similar hypotensive reaction despite her blood sugars being in the normal range as her body is used to higher threshold.  Patient has been working on dieting as well as to lose weight.  She has lost 3 pounds so far.  Patient has seen a cardiologist for this and had an echocardiogram, without any abnormal findings.  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  Recently with work-up here at the urgent care for thyroid, magnesium, CBC, i-STAT without abnormality.  After further discussion patient does  realize that she does not feel the symptoms on the weekends when she is not working.  No she has a history of anxiety but has never been on medicines.  HPI  Past Medical History:  Diagnosis Date  . Chicken pox   . Colon polyps   . Concussion   . Frequent headaches   . GERD (gastroesophageal reflux disease)   . Hypertension   . Migraines   . Pre-eclampsia    with grand mal seizures  . Seizure disorder in pregnancy ([place])   . Seizures ([place])    with pregnancy 1981- pre clampsia     Patient Active Problem List   Diagnosis Date Noted  . Near syncope 10/13/2017  . Obesity (BMI 30.0-34.9) 07/29/2017  . Sinusitis 07/16/2017  . Edema 04/29/2017  . 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 Hypoglycemia 03/25/2017  . 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 Dizziness 10/08/2016  . 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 Menopause present, declines hormone replacement therapy 10/25/2014  . Insomnia 05/05/2014  . Anxiety 05/05/2014  . Chronic venous insufficiency 04/21/2014  . Adenomatous polyp of colon 05/12/2013  . Visit for preventive health examination 03/14/2013  . Essential hypertension, benign 01/28/2013  . 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 COLONOSCOPY    . DILATION AND CURETTAGE OF UTERUS  09/08/2014   with hystereoscopy  . POLYPECTOMY    . TONSILLECTOMY    .  TUBAL LIGATION      OB History   None      Home Medications    Prior to Admission medications   Medication Sig Start Date End Date Taking? Authorizing Provider  albuterol (PROVENTIL HFA;VENTOLIN HFA) 108 (90 Base) MCG/ACT inhaler  07/12/17   [provider]  amoxicillin-clavulanate (AUGMENTIN) 875-125 MG tablet Take 1 tablet by mouth every 12 (twelve) hours. 09/15/17   Jacqualine Mau, NP  BIOTIN FORTE PO Take by mouth daily.    [provider]  Cetirizine HCl (ZYRTEC ALLERGY) 10 MG CAPS Take 1 capsule  (10 mg total) by mouth daily. 09/15/17   Jacqualine Mau, NP  Cholecalciferol (VITAMIN D-3) 1000 UNITS CAPS Take 1 capsule by mouth daily.    [provider]  doxazosin (CARDURA) 2 MG tablet Take 1 tablet (2 mg total) by mouth 2 (two) times daily as needed (Twice daily and as needed.). 10/15/17   Minna Merritts, MD  fluticasone (FLONASE) 50 MCG/ACT nasal spray SHAKE LQ AND U 1 SPR IEN D 07/12/17   [provider]  fluticasone (FLONASE) 50 MCG/ACT nasal spray Place 1 spray into both nostrils daily. 09/15/17   Jacqualine Mau, NP  hydrochlorothiazide (HYDRODIURIL) 25 MG tablet Take 0.5 tablets (12.5 mg total) by mouth daily. 10/06/16   [name], MD  MAGNESIUM MALATE PO Take 1 tablet by mouth daily.     [provider]  Multiple Vitamins-Minerals (MULTIVITAMIN WITH MINERALS) tablet Take 1 tablet by mouth daily.    [provider]  nebivolol (BYSTOLIC) 10 MG tablet Take 1 tablet (10 mg total) by mouth daily. 10/15/17   Minna Merritts, MD  Nutritional Supplements (WOMENS HEALTH SUPPORT PO) Take by mouth.    [provider]  Omega-3 Fatty Acids (FISH OIL) 1200 MG CAPS Take 1,200 mg by mouth daily.     [provider]  oxymetazoline (AFRIN NASAL SPRAY) 0.05 % nasal spray Place 1 spray into both nostrils 2 (two) times daily. 09/15/17   Jacqualine Mau, NP  pantoprazole (PROTONIX) 40 MG tablet Take 1 tablet (40 mg total) by mouth daily before breakfast. 03/09/17   Levin Erp, PA    Family History Family History  Problem Relation Age of Onset  . Hypertension Mother   . Diabetes Mother   . Heart disease Father   . Kidney disease Father   . Hypertension Father   . Diabetes Father   . Cancer Maternal Grandfather        colon cancer  . Colon cancer Maternal Grandfather        passed age 75   . Colon polyps Neg Hx   . Breast cancer Neg Hx     Social History Social History   Tobacco Use  . Smoking status:  Never Smoker  . Smokeless tobacco: Never Used  Substance Use Topics  . Alcohol use: Yes    Comment: very rare - glass red wine when that   . Drug use: No     Allergies   Anesthetics, amide; Benadryl [diphenhydramine hcl (sleep)]; Codeine; Fentanyl; Losartan potassium-hctz; and Prilocaine   Review of Systems Review of Systems  Constitutional: Positive for diaphoresis and fatigue. Negative for activity change, appetite change and fever.  Eyes: Negative for pain and visual disturbance.  Respiratory: Negative for chest tightness and shortness of breath.   Cardiovascular: Negative for chest pain and leg swelling.  Gastrointestinal: Positive for nausea. Negative for abdominal pain and vomiting.  Musculoskeletal:  Negative for arthralgias, gait problem and myalgias.  Skin: Negative for rash and wound.  Neurological: Positive for dizziness, light-headedness and headaches. Negative for speech difficulty, weakness and numbness.     Physical Exam Triage Vital Signs ED Triage Vitals  Enc Vitals Group     BP 10/25/17 1136 133/85     Pulse Rate 10/25/17 1136 65     Resp 10/25/17 1136 18     Temp 10/25/17 1136 98.5 F (36.9 C)     Temp src --      SpO2 10/25/17 1136 100 %     Weight --      Height --      Head Circumference --      Peak Flow --      Pain Score 10/25/17 1132 0     Pain Loc --      Pain Edu? --      Excl. in [place]? --    Orthostatic VS for the past 24 hrs:  BP- Lying Pulse- Lying BP- Sitting Pulse- Sitting BP- Standing at 0 minutes Pulse- Standing at 0 minutes  10/25/17 1307 144/67 58 144/70 60 144/72 58    Updated Vital Signs BP 133/85   Pulse 65   Temp 98.5 F (36.9 C)   Resp 18   LMP 12/01/2013 (Approximate)   SpO2 100%   Visual Acuity Right Eye Distance:   Left Eye Distance:   Bilateral Distance:    Right Eye Near:   Left Eye Near:    Bilateral Near:     Physical Exam  Constitutional: She is oriented to person, place, and time. She appears  well-developed and well-nourished. No distress.  HENT:  Head: Normocephalic and atraumatic.  Mouth/Throat: Oropharynx is clear and moist.  Eyes: Pupils are equal, round, and reactive to light. Conjunctivae and EOM are normal.  Neck: Neck supple.  Cardiovascular: Normal rate and regular rhythm.  No murmur heard. Pulmonary/Chest: Effort normal and breath sounds normal. No respiratory distress.  Breathing comfortably at rest, CTA BL  Abdominal: Soft. There is no tenderness.  Musculoskeletal: She exhibits no edema.  Neurological: She is alert and oriented to person, place, and time.  Cranial nerves II through XII grossly intact, strength 5/5 at shoulders and hips bilaterally, normal coordination, gait without abnormality  Skin: Skin is warm and dry.  Psychiatric: She has a normal mood and affect.  Occasionally becomes emotional/tearful due to frustration of frequent symptoms  Nursing note and vitals reviewed.    UC Treatments / Results  Labs (all labs ordered are listed, but only abnormal results are displayed) Labs Reviewed  GLUCOSE, CAPILLARY    EKG None  Radiology No results found.  Procedures Procedures (including critical care time)  Medications Ordered in UC Medications - No data to display  Initial Impression / Assessment and Plan / UC Course  I have reviewed the triage vital signs and the nursing notes.  Pertinent labs & imaging results that were available during my care of the patient were reviewed by me and considered in my medical decision making (see chart for details).     Patient with lightheadedness, recent frequent work-ups for similar symptoms.  No new change regarding this episode besides frequency and intensity.  No focal neuro deficits.  Exam unremarkable.  Given all recent blood work was normal, will defer.  Patient declined repeating EKG.  Patient does appear to have hypotensive symptoms and a normal blood sugar setting versus symptoms related to  stress/anxiety.  Follow-up with  PCP and endocrinologist for further evaluation and treatment.Discussed strict return precautions. Patient verbalized understanding and is agreeable with plan.  45 minutes was spent talking with the patient as well as discussing evaluation and plan with patient. Final Clinical Impressions(s) / UC Diagnoses   Final diagnoses:  Lightheadedness     Discharge Instructions     Please follow-up with Dr. [name] and your endocrinologist  Please continue to drink plenty of water and monitor your blood sugar ever symptoms develop   ED Prescriptions    None     Controlled Substance Prescriptions [place] Controlled Substance Registry consulted? Not Applicable   Janith Lima, [place] 10/26/17 0011

## 2017-10-29 ENCOUNTER — Encounter: Payer: Self-pay | Admitting: Internal Medicine

## 2017-10-29 ENCOUNTER — Ambulatory Visit (INDEPENDENT_AMBULATORY_CARE_PROVIDER_SITE_OTHER): Payer: No Typology Code available for payment source | Admitting: Internal Medicine

## 2017-10-29 DIAGNOSIS — F604 Histrionic personality disorder: Secondary | ICD-10-CM

## 2017-10-29 NOTE — Progress Notes (Signed)
Subjective:  Patient ID: Nicole Gould, female    DOB: [birth year]  Age: 57 y.o. 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  HPI Nicole Gould presents for follow up on Urgent Care visit  On May 9 for light headedness and presyncope. No workup done as patient has already had cardiology eval and endocrinology evaluation for similar symptoms.   Patient has a long history of recurrent presyncope accompanied by nausea, occasional vomiting, and dizziness.  The symptoms are brought on by hunger and have occurred on a weekly basis for the last several months.  All episodes have occurred on work days. She has seen Endocrinology (2 different providers) for evaluation given a reported history of hypoglycemia (bs of 40) many years ago, but no recent BS below 79.   Her most recent evaluation with Dr Chalmers Cater , (Endocrinology)resulted in a diagnosis of  prediabetes  With A1c of 5.9  And obesity.  she was advised to follow a low glycemic index diet and lose weight.  Her presyncopal  symptoms were attributed to " reactive hypoglycemia" .  She describes the episodes as starting with nausea,  Followed by Vomiting, and states that this occurs when her blood sugars start to dip below 100.  Lowest recorded was apparently 70.   Feels that she is going to faint and has to leave work.  She has never actually fainted.  She has refused to drive for periods up to a week after each incident due to fear of the dizziness occurring while driving.  She states that the symptoms are now occurring daily and she is worried losing her job and has inquired about FMLA   Patient notes that he most recent episode occurred after she started taking metformin ,prescribed by Dr.Balan to regulate her BS and help her lose weight.  She does recall that she  took more than the recommended dose of metformin  500 mg once daily instead of 1/2 tablet daily . She has spoken with Balan's nurse today and has been  advised to to stop the medication and restart it at 1/2 tablet daily .  She has lost 3 lbs since starting the medication.  She saw her cardiologist Dr. Rockey Situ on April 29 ,  Adjusted her medications for hypertension in the event that the presyncopal events were due to medications.  Amlodipine stopped and cardura started   She has been referred to Neurology for new onset symptoms of "brain shudders" that occurs when resting .   It is important to note that none of patient's symptoms have occurred on the weekends or while exercising,  although she has not been exercising regularly because she is discouraged  by episodes of extreme fatigue and aching legs that are so severe she has to go lie down.     Outpatient Medications Prior to Visit  Medication Sig Dispense Refill  . albuterol (PROVENTIL HFA;VENTOLIN HFA) 108 (90 Base) MCG/ACT inhaler     . amLODipine (NORVASC) 2.5 MG tablet     . BIOTIN FORTE PO Take by mouth daily.    . Cetirizine HCl (ZYRTEC ALLERGY) 10 MG CAPS Take 1 capsule (10 mg total) by mouth daily. 30 capsule 0  . Cholecalciferol (VITAMIN D-3) 1000 UNITS CAPS Take 1 capsule by mouth daily.    Marland Kitchen doxazosin (CARDURA) 2 MG tablet Take 1 tablet (2 mg total) by mouth 2 (two) times daily as needed (Twice daily and as needed.). 180 tablet 3  . fluticasone (FLONASE) 50  MCG/ACT nasal spray SHAKE LQ AND U 1 SPR IEN D  0  . fluticasone (FLONASE) 50 MCG/ACT nasal spray Place 1 spray into both nostrils daily. 16 g 2  . hydrochlorothiazide (HYDRODIURIL) 25 MG tablet Take 0.5 tablets (12.5 mg total) by mouth daily. 90 tablet 3  . MAGNESIUM MALATE PO Take 1 tablet by mouth daily.     . metFORMIN (GLUCOPHAGE-XR) 500 MG 24 hr tablet TAKE 1 TABLET BY MOUTH EVERY DAY IN THE EVENING WITH FOOD  6  . Multiple Vitamins-Minerals (MULTIVITAMIN WITH MINERALS) tablet Take 1 tablet by mouth daily.    . nebivolol (BYSTOLIC) 10 MG tablet Take 1 tablet (10 mg total) by mouth daily. 90 tablet 3  . Nutritional  Supplements (WOMENS HEALTH SUPPORT PO) Take by mouth.    . Omega-3 Fatty Acids (FISH OIL) 1200 MG CAPS Take 1,200 mg by mouth daily.     Marland Kitchen oxymetazoline (AFRIN NASAL SPRAY) 0.05 % nasal spray Place 1 spray into both nostrils 2 (two) times daily. 30 mL 0  . pantoprazole (PROTONIX) 40 MG tablet Take 1 tablet (40 mg total) by mouth daily before breakfast. 90 tablet 3  . amoxicillin-clavulanate (AUGMENTIN) 875-125 MG tablet Take 1 tablet by mouth every 12 (twelve) hours. (Patient not taking: Reported on 10/29/2017) 14 tablet 0   Facility-Administered Medications Prior to Visit  Medication Dose Route Frequency Provider Last Rate Last Dose  . 0.9 %  sodium chloride infusion  500 mL Intravenous Continuous Pyrtle, Lajuan Lines, MD        Review of Systems;  Patient denies headache, fevers, malaise, unintentional weight loss, skin rash, eye pain, sinus congestion and sinus pain, sore throat, dysphagia,  hemoptysis , cough, dyspnea, wheezing, chest pain, palpitations, orthopnea, edema, abdominal pain, nausea, melena, diarrhea, constipation, flank pain, dysuria, hematuria, urinary  Frequency, nocturia, numbness, tingling, seizures,  Focal weakness, Loss of consciousness,  Tremor, insomnia, depression, anxiety, and suicidal ideation.      Objective:  BP 124/74 (BP Location: Left Arm, Patient Position: Sitting, Cuff Size: Large)   Pulse 74   Temp 97.9 F (36.6 C) (Oral)   Resp 15   Ht 5\' 4"  (1.626 m)   Wt 198 lb 3.2 oz (89.9 kg)   LMP 12/01/2013 (Approximate)   SpO2 97%   BMI 34.02 kg/m   BP Readings from Last 3 Encounters:  10/29/17 124/74  10/25/17 133/85  10/15/17 132/68    Wt Readings from Last 3 Encounters:  10/29/17 198 lb 3.2 oz (89.9 kg)  10/15/17 203 lb 8 oz (92.3 kg)  07/27/17 195 lb 3.2 oz (88.5 kg)    General appearance: alert, cooperative and appears stated age Ears: normal TM's and external ear canals both ears Throat: lips, mucosa, and tongue normal; teeth and gums normal Neck:  no adenopathy, no carotid bruit, supple, symmetrical, trachea midline and thyroid not enlarged, symmetric, no tenderness/mass/nodules Back: symmetric, no curvature. ROM normal. No CVA tenderness. Lungs: clear to auscultation bilaterally Heart: regular rate and rhythm, S1, S2 normal, no murmur, click, rub or gallop Abdomen: soft, non-tender; bowel sounds normal; no masses,  no organomegaly Pulses: 2+ and symmetric Skin: Skin color, texture, turgor normal. No rashes or lesions Lymph nodes: Cervical, supraclavicular, and axillary nodes normal.  Lab Results  Component Value Date   HGBA1C 5.4 03/23/2017   HGBA1C 5.5 10/06/2016   HGBA1C 5.5 08/27/2015    Lab Results  Component Value Date   CREATININE 0.70 10/04/2017   CREATININE 0.72 04/27/2017   CREATININE 0.68  03/16/2017    Lab Results  Component Value Date   WBC 3.1 (L) 10/04/2017   HGB 14.3 10/04/2017   HCT 42.0 10/04/2017   PLT 181 10/04/2017   GLUCOSE 109 (H) 10/04/2017   CHOL 175 08/27/2015   TRIG 55.0 08/27/2015   HDL 55.90 08/27/2015   LDLDIRECT 113.0 09/16/2014   LDLCALC 108 (H) 08/27/2015   ALT 23 03/16/2017   AST 27 03/16/2017   NA 142 10/04/2017   K 3.5 10/04/2017   CL 106 10/04/2017   CREATININE 0.70 10/04/2017   BUN 18 10/04/2017   CO2 33 (H) 04/27/2017   TSH 1.154 10/04/2017   HGBA1C 5.4 03/23/2017    No results found.  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  I have advised her on multiple occasions to consider a trial of SSRI for management of unacknowledged anxiety,  But she has been resistant to this idea. A total of 60 minutes was spent with patient more than half of which was spent  in counseling patient on the above mentioned issues , reviewing and explaining recent labs and imaging studies done, and coordination of care.         I have discontinued Cari Boulier's amoxicillin-clavulanate. I am also having her maintain her multivitamin with minerals, Vitamin D-3, Fish Oil, MAGNESIUM MALATE PO, BIOTIN FORTE PO, hydrochlorothiazide, Nutritional Supplements (WOMENS HEALTH SUPPORT PO), pantoprazole, albuterol, fluticasone, fluticasone, oxymetazoline, Cetirizine HCl, nebivolol, doxazosin, metFORMIN, and amLODipine. We will continue to administer sodium chloride.  No orders of the defined types were placed in this encounter.   Medications Discontinued During This Encounter  Medication Reason  . amoxicillin-clavulanate (AUGMENTIN) 875-125 MG tablet Completed Course    Follow-up: No follow-ups on file.   [name], MD

## 2017-10-29 NOTE — Patient Instructions (Signed)
I'm glad you came in today  I look forward to hearing  What the neurologist thinks may be causing your "shimmers"   I think anxiety is causing an exaggerated physical response to your drops in blood sugar, and if we treat the anxiety,  You will tolerate the drops without the whole "cascade" of physical symptoms

## 2017-10-30 NOTE — Assessment & Plan Note (Addendum)
Having known this patient for several years ,  I have observed her reaction to any abnormal physical signs or symptoms,  Including elevations in blood pressure,  Intolerance to medications,  Slight fluctuations in blood sugars, and fatigue to be quite exaggerated.  I have advised her on multiple occasions to consider a trial of SSRI for management of unacknowledged anxiety,  But she has been resistant to this idea.  A total of 60 minutes was spent with patient more than half of which was spent in counseling patient on the above mentioned issues , reviewing and explaining recent labs and imaging studies done, and coordination of care.

## 2017-11-02 ENCOUNTER — Ambulatory Visit (HOSPITAL_COMMUNITY)
Admission: RE | Admit: 2017-11-02 | Discharge: 2017-11-02 | Disposition: A | Discharge status: Home / Self Care | Payer: No Typology Code available for payment source | Source: Ambulatory Visit | Attending: Vascular Surgery | Admitting: Vascular Surgery

## 2017-11-02 ENCOUNTER — Encounter: Payer: Self-pay | Admitting: Surgery

## 2017-11-02 ENCOUNTER — Other Ambulatory Visit: Payer: Self-pay

## 2017-11-02 ENCOUNTER — Ambulatory Visit (INDEPENDENT_AMBULATORY_CARE_PROVIDER_SITE_OTHER): Payer: No Typology Code available for payment source | Admitting: Surgery

## 2017-11-02 VITALS — BP 115/72 | HR 73 | Temp 97.9°F | Resp 16 | Ht 64.0 in | Wt 198.0 lb

## 2017-11-02 DIAGNOSIS — I872 Venous insufficiency (chronic) (peripheral): Secondary | ICD-10-CM | POA: Diagnosis not present

## 2017-11-02 DIAGNOSIS — R609 Edema, unspecified: Secondary | ICD-10-CM | POA: Insufficient documentation

## 2017-11-02 NOTE — Progress Notes (Signed)
[facility] [place]  Patient name: Nicole Gould MRN: [id number] DOB: Oct 13, 1960 Sex: female   REQUESTING PROVIDER:    Dr. [name]   REASON FOR CONSULT:    Venous insufficiency  HISTORY OF PRESENT ILLNESS:   Nicole Gould is a 57 y.o. female, who is referred for evaluation of chronic venous insufficiency.  The patient states that for quite some time she has been having issues with bilateral leg swelling.  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  She has worn compression stockings but does not like to wear them.  She does try to keep her legs elevated.  Prolonged standing makes her feet more swollen and more painful at the end of the day.  Elevation helps.  The patient suffers from diabetes which is managed without insulin.  She is medically managed for hypertension.  She is a non-smoker.  PAST MEDICAL HISTORY    Past Medical History:  Diagnosis Date  . Chicken pox   . Colon polyps   . Concussion   . Frequent headaches   . GERD (gastroesophageal reflux disease)   . Hypertension   . Migraines   . Pre-eclampsia    with grand mal seizures  . Seizure disorder in pregnancy ([place])   . Seizures ([place])    with pregnancy 1981- pre clampsia      FAMILY HISTORY   Family History  Problem Relation Age of Onset  . Hypertension Mother   . Diabetes Mother   . Heart disease Father   . Kidney disease Father   . Hypertension Father   . Diabetes Father   . Cancer Maternal Grandfather        colon cancer  . Colon cancer Maternal Grandfather        passed age 6   . Colon polyps Neg Hx   . Breast cancer Neg Hx     SOCIAL HISTORY:   Social History   Socioeconomic History  . Marital status: Married    Spouse name: Not on file  . Number of children: Not on file  . Years of education: Not on file  . Highest education level: Not on file  Occupational History  . Not on file  Social Needs  .  Financial resource strain: Not on file  . Food insecurity:    Worry: Not on file    Inability: Not on file  . Transportation needs:    Medical: Not on file    Non-medical: Not on file  Tobacco Use  . Smoking status: Never Smoker  . Smokeless tobacco: Never Used  Substance and Sexual Activity  . Alcohol use: Yes    Comment: very rare - glass red wine when that   . Drug use: No  . Sexual activity: Yes    Partners: Male  Lifestyle  . Physical activity:    Days per week: Not on file    Minutes per session: Not on file  . Stress: Not on file  Relationships  . Social connections:    Talks on phone: Not on file    Gets together: Not on file    Attends religious service: Not on file    Active member of club or organization: Not on file    Attends meetings of clubs or organizations: Not on file    Relationship status: Not on file  . Intimate partner violence:    Fear of current or ex partner: Not on file  Emotionally abused: Not on file    Physically abused: Not on file    Forced sexual activity: Not on file  Other Topics Concern  . Not on file  Social History Narrative  . Not on file    ALLERGIES:    Allergies  Allergen Reactions  . Anesthetics, Amide Nausea And Vomiting  . Benadryl [Diphenhydramine Hcl (Sleep)]     Made feel paralyzed when had benadryl with a cocktail for procedure  Decadron, bendayl, regaln combo at [facility] 2014- see care every where   . Codeine     Other reaction(s): Unknown  . Fentanyl Hypertension    Severe htn   . Losartan Potassium-Hctz     Dizziness,  Shortness of breath   . Prilocaine Nausea And Vomiting    CURRENT MEDICATIONS:    Current Outpatient Medications  Medication Sig Dispense Refill  . albuterol (PROVENTIL HFA;VENTOLIN HFA) 108 (90 Base) MCG/ACT inhaler     . amLODipine (NORVASC) 2.5 MG tablet     . BIOTIN FORTE PO Take by mouth daily.    . Cetirizine HCl (ZYRTEC ALLERGY) 10 MG CAPS Take 1 capsule (10 mg total) by mouth  daily. 30 capsule 0  . Cholecalciferol (VITAMIN D-3) 1000 UNITS CAPS Take 1 capsule by mouth daily.    Marland Kitchen doxazosin (CARDURA) 2 MG tablet Take 1 tablet (2 mg total) by mouth 2 (two) times daily as needed (Twice daily and as needed.). 180 tablet 3  . fluticasone (FLONASE) 50 MCG/ACT nasal spray SHAKE LQ AND U 1 SPR IEN D  0  . fluticasone (FLONASE) 50 MCG/ACT nasal spray Place 1 spray into both nostrils daily. 16 g 2  . hydrochlorothiazide (HYDRODIURIL) 25 MG tablet Take 0.5 tablets (12.5 mg total) by mouth daily. 90 tablet 3  . MAGNESIUM MALATE PO Take 1 tablet by mouth daily.     . metFORMIN (GLUCOPHAGE-XR) 500 MG 24 hr tablet TAKE 1 TABLET BY MOUTH EVERY DAY IN THE EVENING WITH FOOD  6  . Multiple Vitamins-Minerals (MULTIVITAMIN WITH MINERALS) tablet Take 1 tablet by mouth daily.    . nebivolol (BYSTOLIC) 10 MG tablet Take 1 tablet (10 mg total) by mouth daily. 90 tablet 3  . Nutritional Supplements (WOMENS HEALTH SUPPORT PO) Take by mouth.    . Omega-3 Fatty Acids (FISH OIL) 1200 MG CAPS Take 1,200 mg by mouth daily.     Marland Kitchen oxymetazoline (AFRIN NASAL SPRAY) 0.05 % nasal spray Place 1 spray into both nostrils 2 (two) times daily. 30 mL 0  . pantoprazole (PROTONIX) 40 MG tablet Take 1 tablet (40 mg total) by mouth daily before breakfast. 90 tablet 3   Current Facility-Administered Medications  Medication Dose Route Frequency Provider Last Rate Last Dose  . 0.9 %  sodium chloride infusion  500 mL Intravenous Continuous Pyrtle, Lajuan Lines, MD        REVIEW OF SYSTEMS:   [X]  denotes positive finding, [ ]  denotes negative finding Cardiac  Comments:  Chest pain or chest pressure:    Shortness of breath upon exertion:    Short of breath when lying flat:    Irregular heart rhythm:        Vascular    Pain in calf, thigh, or hip brought on by ambulation:    Pain in feet at night that wakes you up from your sleep:     Blood clot in your veins:    Leg swelling:  x       Pulmonary  Oxygen at  home:    Productive cough:     Wheezing:         Neurologic    Sudden weakness in arms or legs:     Sudden numbness in arms or legs:     Sudden onset of difficulty speaking or slurred speech:    Temporary loss of vision in one eye:     Problems with dizziness:  x       Gastrointestinal    Blood in stool:      Vomited blood:         Genitourinary    Burning when urinating:     Blood in urine:        Psychiatric    Major depression:         Hematologic    Bleeding problems:    Problems with blood clotting too easily:        Skin    Rashes or ulcers:        Constitutional    Fever or chills:     PHYSICAL EXAM:   Vitals:   11/02/17 1406  BP: 115/72  Pulse: 73  Resp: 16  Temp: 97.9 F (36.6 C)  TempSrc: Oral  SpO2: 99%  Weight: 198 lb (89.8 kg)  Height: 5\' 4"  (1.626 m)    GENERAL: The patient is a well-nourished female, in no acute distress. The vital signs are documented above. CARDIAC: There is a regular rate and rhythm.  VASCULAR: palpable pedal pulses.  1+ edema PULMONARY: Nonlabored respirations MUSCULOSKELETAL: There are no major deformities or cyanosis. NEUROLOGIC: No focal weakness or paresthesias are detected.  She is SKIN: There are no ulcers or rashes noted. PSYCHIATRIC: The patient has a normal affect.  STUDIES:   I have ordered and reviewed the vascular lab studies with the following findings:  Right: Abnormal reflux times noted in the common femoral, great saphenous, and small saphenous vein.  Maximum vein diameter is 0.718 cm at the saphenofemoral junction  Left: Abnormal reflux times noted in the common femoral and great saphenous vein with maximum diameter of 0.91 cm of the saphenofemoral junction  ASSESSMENT and PLAN   Chronic venous insufficiency, bilateral lower extremity, CEAP class 3.  I discussed with the patient that most appropriate initial course of action would be to consistently wear 20-30 compression stockings and keep her  legs elevated when possible.  Based on the size of her saphenous vein, I am not sure she is a candidate for intervention.  We discussed that over time this may change.  She is to try to manage her symptoms with elevation and compression.  This will help minimize the risk of long-term complications such as skin color changes and ulcers.  I have her scheduled for follow-up in 3 months to see how she is doing.   Annamarie Major, MD [facility] [phone] Pager [phone]

## 2017-11-10 DIAGNOSIS — R7303 Prediabetes: Secondary | ICD-10-CM | POA: Insufficient documentation

## 2017-11-10 DIAGNOSIS — Z8669 Personal history of other diseases of the nervous system and sense organs: Secondary | ICD-10-CM | POA: Insufficient documentation

## 2017-11-10 DIAGNOSIS — R251 Tremor, unspecified: Secondary | ICD-10-CM | POA: Insufficient documentation

## 2017-11-10 DIAGNOSIS — G43009 Migraine without aura, not intractable, without status migrainosus: Secondary | ICD-10-CM | POA: Insufficient documentation

## 2017-11-10 DIAGNOSIS — G43019 Migraine without aura, intractable, without status migrainosus: Secondary | ICD-10-CM | POA: Insufficient documentation

## 2017-11-23 ENCOUNTER — Ambulatory Visit: Payer: Self-pay | Admitting: Cardiovascular Disease

## 2017-11-28 ENCOUNTER — Telehealth: Payer: Self-pay | Admitting: Cardiovascular Disease

## 2017-11-28 NOTE — Telephone Encounter (Signed)
Pt c/o medication issue:  1. Name of Medication: Cardura   2. How are you currently taking this medication (dosage and times per day)? Night time, she thinks she's taking 10 mg   3. Are you having a reaction (difficulty breathing--STAT)? No   4. What is your medication issue? It is keeping her BP low but is making her dizzy and tired all the time. She feels like she is going to pass out  She states she skipped last night dose and is feeling still "whoozy" but is better than how she is normally is when she wakes up

## 2017-11-28 NOTE — Telephone Encounter (Signed)
Left voicemail message to call back  

## 2017-11-29 NOTE — Telephone Encounter (Signed)
Pt is returning your call

## 2017-11-29 NOTE — Telephone Encounter (Signed)
Patient states that she has not been feeling well and thinks it is her medications. She stopped taking her blood pressure medications and felt better but then her blood pressures started going back up. She reports her systolic blood pressure was up to 185 and she then took one norvasc. She states that these medications have made her to not feel very well at all with dizziness and feeling bad. Reviewed medications with her in detail. She started taking the new medication Cardura and she states that she is just not able to function while taking that so she stopped it yesterday. She reports that the norvasc also makes her sick feeling and she doesn't like feeling so bad all of the time. She wants to know if there is something else she could try that will not make her feel so bad. Advised that I would route this to Dr. Rockey Situ for his review and that I would be in touch with any of his recommendations. She verbalized understanding with no further questions at this time.

## 2017-11-30 ENCOUNTER — Other Ambulatory Visit: Payer: Self-pay | Admitting: Internal Medicine

## 2017-11-30 NOTE — Telephone Encounter (Signed)
I think she is still on the Bystolic and HCTZ What are her blood pressures on this If blood pressure runs high on these 2, We could try adding spironolactone 25 mg daily that when she takes the HCTZ her potassium does not drop This should give [place] an additional 10 point drop in blood pressure and help her electrolytes with minimal side effects hopefully

## 2017-11-30 NOTE — Telephone Encounter (Signed)
I did not realize she was not taking any medications Previously was doing well for quite a long time on atenolol , HCTZ Then  went to HCTZ and bystolic, was doing well We have tried losartan before Tried Norvasc Now tried Cardura Running out of options Would probably just check her blood pressure when she is not at work and see where it runs If it runs high would probably recommend she restart hctz and/or bystolic.

## 2017-12-03 NOTE — Telephone Encounter (Signed)
Spoke with patient and reviewed medications and recommendations from Dr. Rockey Situ. She abruptly cut me off stating that she is at work and reports that she is taking HCTZ and the bystolic along with 1/2 pill of the norvasc. Reviewed that in previous conversations she stated that she had stopped everything to see how she responded. She is at work and states that she is just not able to talk over the phone during clinic hours and would prefer to come in and discuss with Dr. Rockey Situ. Scheduled her to come in for visit and requested that she monitor her blood pressures. She was appreciative for the call with no further questions at this time.

## 2017-12-05 ENCOUNTER — Telehealth: Payer: Self-pay | Admitting: Cardiovascular Disease

## 2017-12-05 NOTE — Telephone Encounter (Signed)
Pt states dr Rockey Situ put her on BP medication, and states it "made me sick", so she stopped this new medication. Please call to discuss. States this medication gives her a headache, dizzy, and "just makes me sick" States she just wants to stay in the bed

## 2017-12-06 ENCOUNTER — Telehealth: Payer: Self-pay

## 2017-12-06 DIAGNOSIS — F419 Anxiety disorder, unspecified: Secondary | ICD-10-CM

## 2017-12-06 DIAGNOSIS — E162 Hypoglycemia, unspecified: Secondary | ICD-10-CM

## 2017-12-06 NOTE — Telephone Encounter (Signed)
Spoke with patient and she persisted that she be worked in sooner. Reviewed that we are completely booked and that Dr. Rockey Situ will be out all next week for vacation. Advised that she would need to seek evaluation with her PCP, ED, or Urgent care if her symptoms persist or worsen. She verbalized understanding with no further questions at this time.

## 2017-12-06 NOTE — Addendum Note (Signed)
Addended by: [name] on: 12/06/2017 09:57 PM   Modules accepted: Orders

## 2017-12-06 NOTE — Telephone Encounter (Signed)
Spoke with patient and she reports that she feels her medications are causing her to feel terrible. She reports she was in tears due to feeling so bad. At work she gave a wrong injection to patient and she reports that this is not like her and thinks it was due to her medications making her feel bad. She then started discussing her symptoms:  Extra heart beats  Lightheaded Blood pressure Headaches Feeling faint Dizziness   She reports systolic numbers last night were 177 & 185 before going to bed. This morning her blood pressure was 131/76. She was sent to neurologist they did EEG which turned out normal and they told her to come back to cardiology. She continued to discuss feeling bad. Instructed her to monitor her blood pressures and keep a log to bring into her upcoming appointment. Also requested that she keep track of what medications she takes and when to see if that correlates with her pressures. She verbalized understanding of our conversation and had no further questions at this time.

## 2017-12-06 NOTE — Telephone Encounter (Signed)
Referral in process

## 2017-12-06 NOTE — Telephone Encounter (Signed)
Patient states she has been sick again and is having to leave work States she does not think she can wait another week with this medication  She really wants to be seen sooner - would like to know if there is anything we can do  Please call to discuss

## 2017-12-06 NOTE — Telephone Encounter (Signed)
Please advise 

## 2017-12-06 NOTE — Telephone Encounter (Signed)
Copied from [place] [id number]. Topic: Referral - Request >> Dec 06, 2017 10:26 AM Pricilla Handler wrote: Reason for CRM: Patient would like Dr. [name] to send a referral to a counselor asap. Patient states that she needs to talk with someone about the way she is feeling. Patient states that Dr. [name] is aware of her situation. Patient would like a call back from Dr. [name] at [phone].       Thank You!!!

## 2017-12-07 MED ORDER — ALPRAZOLAM 0.25 MG PO TABS: 0.2500 mg | ORAL_TABLET | Freq: Two times a day (BID) | ORAL | 0 refills | Status: DC | PRN

## 2017-12-07 NOTE — Telephone Encounter (Signed)
LMTCB. Please transfer pt to our office.  

## 2017-12-07 NOTE — Telephone Encounter (Signed)
Spoke with pt and informed her that you and Dr. Rockey Situ are in agreement that pt may have some unacknowledged anxiety and that you have prescribed her a low dose of alprazolam for her to use over the weekend but not more than two a day. The pt stated that she does not feel that she has anxiety. She stated that she feels like it was the blood pressure medication because when she stopped taking it she felt better but she had to go back on something for the blood pressure issue, so she started taking it again but only at a half a tablet and she states that she is feeling like her self more and more each day. She stated that she would come in on Monday at 4:30 to discuss everything with you.

## 2017-12-07 NOTE — Telephone Encounter (Signed)
We have received lots of phone calls from Nicole Gould Often tearful on the phone, panicky. I am glad she is reaching out and willing to talk with a counselor I did not think symptoms are secondary to blood pressure or blood pressure medication rather anxiety causing hypertension Nurses  put her on my schedule for July 1 Many medications causing intolerances, running out of options for pills thx TG

## 2017-12-07 NOTE — Telephone Encounter (Signed)
Please let patient know that Dr Rockey Situ agrees with me that unacknowledged anxiety may be a significant component , and I have made the referral to a therapist.  I would also like her to see me on Monday at 4:30   And I will call in a low dose of alprazolam for her to use over the weekend to help with the symptoms .  Not more than  2 per day  .

## 2017-12-10 ENCOUNTER — Encounter: Payer: Self-pay | Admitting: Internal Medicine

## 2017-12-10 ENCOUNTER — Ambulatory Visit: Payer: No Typology Code available for payment source | Admitting: Internal Medicine

## 2017-12-10 DIAGNOSIS — I10 Essential (primary) hypertension: Secondary | ICD-10-CM

## 2017-12-10 DIAGNOSIS — R55 Syncope and collapse: Secondary | ICD-10-CM

## 2017-12-10 DIAGNOSIS — E161 Other hypoglycemia: Secondary | ICD-10-CM | POA: Diagnosis not present

## 2017-12-10 NOTE — Assessment & Plan Note (Addendum)
secondary to obesity.  Occurs whenever blood sugars drop into the 80's.  Happens only at work.  Accompanied by severe dizziness and panic attack.     Home fasting sugars are between   90 to 102 . Dr Chalmers Cater has recommended limiting carb intake to  30 g carb in am, 15 g with snack. 30 g lunch  15 g mid afternoon snack  .  And 30 g with dinner.  Limit 6 ounces red wine with food. Was prescribed metformin but it made hr have an "out of body experience"  So it was stopped

## 2017-12-10 NOTE — Patient Instructions (Signed)
No medication changes today  I recommend taking the amlodipine at  Or around  5 pm

## 2017-12-10 NOTE — Assessment & Plan Note (Signed)
Per patient,  Secondary to benicar .  Has had neurology and cardiology eval.  No episodes since stopping benicar

## 2017-12-10 NOTE — Assessment & Plan Note (Addendum)
Currently managed with 2.5 mg amlodipine htz 25 mg daily, and 10 mg Bystolic . No changes today. Had a presyncopal episode while taking benicar

## 2017-12-10 NOTE — Progress Notes (Signed)
Subjective:  Patient ID: Nicole Gould, female    DOB: 01-03-1961  Age: 57 y.o. MRN: [id number]  CC: Diagnoses of Near syncope, Essential hypertension, benign, and Reactive hypoglycemia were pertinent to this visit.  HPI Nicole Gould presents for  Follow up on recurrent dizziness  And hypertension with multiple medication intolerances  .  Has seen endocrinology for reactive hypoglycemia causing severe dizziness .  lowest CBG was 70  ,  Referred to neurology for presyncope ,  Who assessed her with EEG and referred  back to cardiology .  Marland Kitchen   Had an episode of presyncope at work June 5   The episode occurred around 9:30-10:00 while she was getting medication out of a cabinet in the lab at her office   148/78   Pulse 64  CBG was 88 . Marland Kitchen  Was taking benicar at the time and had taken the full tablet .  Instead of a half tablet .    Feels that the benicar was making her feel presyncopal. But has not tried reducing the dose to the half tablet.      Has resumed previous dose    Readings at home have been labile from 113/62 to 167/70  Currently  Tolerating  amlodipine 2.5 mg at bedtime,  bystolic 10 mg  And hctz 25 mg  In the morning .  Notes BP is elevated  Around 7 pm.  Advised to take the amlodipine earlier.  Needs a note for work "(" there was an incident at work") she drew up the wrong medication for an intrarticular injection ."          Outpatient Medications Prior to Visit  Medication Sig Dispense Refill  . amLODipine (NORVASC) 2.5 MG tablet     . BIOTIN FORTE PO Take by mouth daily.    . Cholecalciferol (VITAMIN D-3) 1000 UNITS CAPS Take 1 capsule by mouth daily.    . fluticasone (FLONASE) 50 MCG/ACT nasal spray SHAKE LQ AND U 1 SPR IEN D  0  . hydrochlorothiazide (HYDRODIURIL) 25 MG tablet TAKE 0.5 TABLETS (12.5 MG TOTAL) BY MOUTH DAILY. 90 tablet 1  . MAGNESIUM MALATE PO Take 1 tablet by mouth daily.     . Multiple Vitamins-Minerals (MULTIVITAMIN WITH MINERALS) tablet Take 1  tablet by mouth daily.    . nebivolol (BYSTOLIC) 10 MG tablet Take 1 tablet (10 mg total) by mouth daily. 90 tablet 3  . pantoprazole (PROTONIX) 40 MG tablet Take 1 tablet (40 mg total) by mouth daily before breakfast. 90 tablet 3  . albuterol (PROVENTIL HFA;VENTOLIN HFA) 108 (90 Base) MCG/ACT inhaler     . ALPRAZolam (XANAX) 0.25 MG tablet Take 1 tablet (0.25 mg total) by mouth 2 (two) times daily as needed for anxiety. (Patient not taking: Reported on 12/10/2017) 20 tablet 0  . Omega-3 Fatty Acids (FISH OIL) 1200 MG CAPS Take 1,200 mg by mouth daily.     . Cetirizine HCl (ZYRTEC ALLERGY) 10 MG CAPS Take 1 capsule (10 mg total) by mouth daily. (Patient not taking: Reported on 12/10/2017) 30 capsule 0  . doxazosin (CARDURA) 2 MG tablet Take 1 tablet (2 mg total) by mouth 2 (two) times daily as needed (Twice daily and as needed.). (Patient not taking: Reported on 12/10/2017) 180 tablet 3  . fluticasone (FLONASE) 50 MCG/ACT nasal spray Place 1 spray into both nostrils daily. (Patient not taking: Reported on 12/10/2017) 16 g 2  . metFORMIN (GLUCOPHAGE-XR) 500 MG 24 hr tablet TAKE 1 TABLET  BY MOUTH EVERY DAY IN THE EVENING WITH FOOD  6  . Nutritional Supplements (WOMENS HEALTH SUPPORT PO) Take by mouth.    Marland Kitchen oxymetazoline (AFRIN NASAL SPRAY) 0.05 % nasal spray Place 1 spray into both nostrils 2 (two) times daily. (Patient not taking: Reported on 12/10/2017) 30 mL 0   Facility-Administered Medications Prior to Visit  Medication Dose Route Frequency Provider Last Rate Last Dose  . 0.9 %  sodium chloride infusion  500 mL Intravenous Continuous Pyrtle, Lajuan Lines, MD        Review of Systems;  Patient denies headache, fevers, malaise, unintentional weight loss, skin rash, eye pain, sinus congestion and sinus pain, sore throat, dysphagia,  hemoptysis , cough, dyspnea, wheezing, chest pain, palpitations, orthopnea, edema, abdominal pain, nausea, melena, diarrhea, constipation, flank pain, dysuria, hematuria, urinary   Frequency, nocturia, numbness, tingling, seizures,  Focal weakness, Loss of consciousness,  Tremor, insomnia, depression, anxiety, and suicidal ideation.      Objective:  BP 120/68 (BP Location: Left Arm, Patient Position: Sitting, Cuff Size: Normal)   Pulse 67   Temp 98.5 F (36.9 C) (Oral)   Resp 15   Ht 5\' 4"  (1.626 m)   Wt 193 lb (87.5 kg)   LMP 12/01/2013 (Approximate)   SpO2 98%   BMI 33.13 kg/m   BP Readings from Last 3 Encounters:  12/10/17 120/68  11/02/17 115/72  10/29/17 124/74    Wt Readings from Last 3 Encounters:  12/10/17 193 lb (87.5 kg)  11/02/17 198 lb (89.8 kg)  10/29/17 198 lb 3.2 oz (89.9 kg)    General appearance: alert, cooperative and appears stated age Ears: normal TM's and external ear canals both ears Throat: lips, mucosa, and tongue normal; teeth and gums normal Neck: no adenopathy, no carotid bruit, supple, symmetrical, trachea midline and thyroid not enlarged, symmetric, no tenderness/mass/nodules Back: symmetric, no curvature. ROM normal. No CVA tenderness. Lungs: clear to auscultation bilaterally Heart: regular rate and rhythm, S1, S2 normal, no murmur, click, rub or gallop Abdomen: soft, non-tender; bowel sounds normal; no masses,  no organomegaly Pulses: 2+ and symmetric Skin: Skin color, texture, turgor normal. No rashes or lesions Lymph nodes: Cervical, supraclavicular, and axillary nodes normal.  Lab Results  Component Value Date   HGBA1C 5.4 03/23/2017   HGBA1C 5.5 10/06/2016   HGBA1C 5.5 08/27/2015    Lab Results  Component Value Date   CREATININE 0.70 10/04/2017   CREATININE 0.72 04/27/2017   CREATININE 0.68 03/16/2017    Lab Results  Component Value Date   WBC 3.1 (L) 10/04/2017   HGB 14.3 10/04/2017   HCT 42.0 10/04/2017   PLT 181 10/04/2017   GLUCOSE 109 (H) 10/04/2017   CHOL 175 08/27/2015   TRIG 55.0 08/27/2015   HDL 55.90 08/27/2015   LDLDIRECT 113.0 09/16/2014   LDLCALC 108 (H) 08/27/2015   ALT 23  03/16/2017   AST 27 03/16/2017   NA 142 10/04/2017   K 3.5 10/04/2017   CL 106 10/04/2017   CREATININE 0.70 10/04/2017   BUN 18 10/04/2017   CO2 33 (H) 04/27/2017   TSH 1.154 10/04/2017   HGBA1C 5.4 03/23/2017  .tt2  No results found.  Assessment & Plan:   Problem List Items Addressed This Visit    Reactive hypoglycemia    secondary to obesity.  Occurs whenever blood sugars drop into the 80's.  Happens only at work.  Accompanied by severe dizziness and panic attack.     Home fasting sugars are between  90 to 102 . Dr Chalmers Cater has recommended limiting carb intake to  30 g carb in am, 15 g with snack. 30 g lunch  15 g mid afternoon snack  .  And 30 g with dinner.  Limit 6 ounces red wine with food. Was prescribed metformin but it made hr have an "out of body experience"  So it was stopped       Near syncope    Per patient,  Secondary to benicar .  Has had neurology and cardiology eval.  No episodes since stopping benicar       Essential hypertension, benign    Currently managed with 2.5 mg amlodipine htz 25 mg daily, and 10 mg Bystolic . No changes today. Had a presyncopal episode while taking benicar         A total of 25 minutes of face to face time was spent with patient more than half of which was spent in counselling about the above mentioned conditions  and coordination of care   I have discontinued Cristela Petre's Nutritional Supplements ([place]), oxymetazoline, Cetirizine HCl, doxazosin, and metFORMIN. I am also having her maintain her multivitamin with minerals, Vitamin D-3, Fish Oil, MAGNESIUM MALATE PO, BIOTIN FORTE PO, pantoprazole, albuterol, fluticasone, nebivolol, amLODipine, hydrochlorothiazide, and ALPRAZolam. We will continue to administer sodium chloride.  No orders of the defined types were placed in this encounter.   Medications Discontinued During This Encounter  Medication Reason  . Cetirizine HCl (ZYRTEC ALLERGY) 10 MG CAPS Patient has  not taken in last 30 days  . doxazosin (CARDURA) 2 MG tablet Patient has not taken in last 30 days  . fluticasone (FLONASE) 50 MCG/ACT nasal spray Duplicate  . metFORMIN (GLUCOPHAGE-XR) 500 MG 24 hr tablet Patient has not taken in last 30 days  . Nutritional Supplements (WOMENS HEALTH SUPPORT PO) Patient has not taken in last 30 days  . oxymetazoline (AFRIN NASAL SPRAY) 0.05 % nasal spray Patient has not taken in last 30 days    Follow-up: No follow-ups on file.   [name], MD

## 2017-12-15 NOTE — Progress Notes (Signed)
Cardiology Office Note  Date:  12/17/2017   ID:  Nicole Gould, DOB [birth year], MRN [id number]  PCP:  Nicole Mc, MD   Chief Complaint  Patient presents with  . Other    Patient c/o high blood pressure and and Dizziness. Patient states that it is worse in the morning. Patient states she knows it is her medication causing this. Meds reviewed verbally with patient.     HPI:  [name] is a 57 year old woman with history of  palpitations,  hypertension  Anxiety hypoglycemia who presents for follow-up of her Blood pressure  On Cardura 2 mg was having dizziness, low pressures (was supposed to take a 1/2 daily) Went back onto amlodipine 2.5  BP higher again, 140s to 160s Felt her blood pressure was better on the Cardura Concerned about low potassium and dry mouth with HCTZ Concerned about leg swelling with amlodipine Now wearing compression hose after seeing vein endovascular  Denies having any tachycardia or palpitations on the bystolic 10 mg Having dizziness on a regular basis although blood pressure not exceedingly low  Had an episode of presyncope at work June 5    The episode occurred around 9:30-10:00 while she was getting medication out of a cabinet in the lab at her office   148/78   Pulse 64  CBG was 88   Currently on amlodipine 2.5 mg  hctz 25 mg daily, and 10 mg Bystolic    Readings at home have been labile from 113/62 to 167/70    chronic dizziness Etiology unclear Wonders if it could be low sugars Has been to the emergency room several times with negative work-up  On previous ER visit her dizziness, BP 638V systolic, Orthostatic negative Low calcium Glucose 109  Reports having 2 episodes of near syncope One episode was driving looked back behind her when she acutely became dizzy Symptoms were quick Described it like she had a "whipping in my head" Seemed to linger  Saw Dr. Della Goo, HGB A1C 5.4 up to 5.7  Worsening swelling on amloidpine  5 mg in the past  Poor sleep, feels hot on and off feels she is having menopause  On prior office visits had occasional chest tightness  Previous side effects on losartan,  Atenolol did not help her palpitations, changed to bystolic  Orthostatics negative in the psst  Other past medical history Previously started on losartan and she was on this for 1 week but reported having significant side effects of general malaise This was held and she was started on amlodipine which she only took for 1 day as this caused a headache  back on HCTZ one half pill and atenolol 25 mg daily. She's had been on this combination for a long time   lab work showing total cholesterol 183, LDL 106. This was reviewed with her   Myoview in 2005 for ectopy that was reportedly normal. She attributes her symptoms to menopause. Menopause symptoms have been getting worse over the past several years. Now with sweating, occasional palpitations, malaise.   history of concussion in April 2014 and was evaluated by Dr. Manuella Ghazi at [place] [facility].  The injury occurred when she fell out of a chair and a metal bar on chair hit her in the back of head    Seeing a headache specialist in at [facility] of CAD in father .   PMH:   has a past medical history of Chicken pox, Colon polyps, Concussion, Frequent headaches, GERD (gastroesophageal reflux disease), Hypertension,  Migraines, Pre-eclampsia, Seizure disorder in pregnancy ([place]), and Seizures ([place]).  PSH:    Past Surgical History:  Procedure Laterality Date  . COLONOSCOPY    . DILATION AND CURETTAGE OF UTERUS  09/08/2014   with hystereoscopy  . POLYPECTOMY    . TONSILLECTOMY    . TUBAL LIGATION      Current Outpatient Medications  Medication Sig Dispense Refill  . albuterol (PROVENTIL HFA;VENTOLIN HFA) 108 (90 Base) MCG/ACT inhaler     . ALPRAZolam (XANAX) 0.25 MG tablet Take 1 tablet (0.25 mg total) by mouth 2 (two) times daily as needed for anxiety. 20 tablet 0  .  amLODipine (NORVASC) 2.5 MG tablet     . BIOTIN FORTE PO Take by mouth daily.    . Cholecalciferol (VITAMIN D-3) 1000 UNITS CAPS Take 1 capsule by mouth daily.    . fluticasone (FLONASE) 50 MCG/ACT nasal spray SHAKE LQ AND U 1 SPR IEN D  0  . hydrochlorothiazide (HYDRODIURIL) 25 MG tablet TAKE 0.5 TABLETS (12.5 MG TOTAL) BY MOUTH DAILY. 90 tablet 1  . MAGNESIUM MALATE PO Take 1 tablet by mouth daily.     . Multiple Vitamins-Minerals (MULTIVITAMIN WITH MINERALS) tablet Take 1 tablet by mouth daily.    . nebivolol (BYSTOLIC) 10 MG tablet Take 1 tablet (10 mg total) by mouth daily. 90 tablet 3  . Omega-3 Fatty Acids (FISH OIL) 1200 MG CAPS Take 1,200 mg by mouth daily.     . pantoprazole (PROTONIX) 40 MG tablet Take 1 tablet (40 mg total) by mouth daily before breakfast. 90 tablet 3   Current Facility-Administered Medications  Medication Dose Route Frequency Provider Last Rate Last Dose  . 0.9 %  sodium chloride infusion  500 mL Intravenous Continuous Pyrtle, Lajuan Lines, MD         Allergies:   Losartan; Anesthetics, amide; Benadryl [diphenhydramine hcl (sleep)]; Benicar [olmesartan]; Codeine; Fentanyl; Losartan potassium-hctz; Metformin and related; and Prilocaine   Social History:  The patient  reports that she has never smoked. She has never used smokeless tobacco. She reports that she drinks alcohol. She reports that she does not use drugs.   Family History:   family history includes Cancer in her maternal grandfather; Colon cancer in her maternal grandfather; Diabetes in her father and mother; Heart disease in her father; Hypertension in her father and mother; Kidney disease in her father.    Review of Systems: Review of Systems  Constitutional: Negative.   Respiratory: Negative.   Cardiovascular: Positive for leg swelling.  Gastrointestinal: Negative.   Musculoskeletal: Negative.   Neurological: Positive for dizziness.  Psychiatric/Behavioral: Negative.   All other systems reviewed and  are negative.    PHYSICAL EXAM: VS:  BP (!) 148/72 (BP Location: Left Arm, Patient Position: Sitting, Cuff Size: Normal)   Pulse (!) 58   Ht 5\' 4"  (1.626 m)   Wt 192 lb (87.1 kg)   LMP 12/01/2013 (Approximate)   BMI 32.96 kg/m  , BMI Body mass index is 32.96 kg/m. Constitutional:  oriented to person, place, and time. No distress.  HENT:  Head: Normocephalic and atraumatic.  Eyes:  no discharge. No scleral icterus.  Neck: Normal range of motion. Neck supple. No JVD present.  Cardiovascular: Normal rate, regular rhythm, normal heart sounds and intact distal pulses. Exam reveals no gallop and no friction rub. No edema No murmur heard. Pulmonary/Chest: Effort normal and breath sounds normal. No stridor. No respiratory distress.  no wheezes.  no rales.  no tenderness.  Abdominal:  Soft.  no distension.  no tenderness.  Musculoskeletal: Normal range of motion.  no  tenderness or deformity.  Neurological:  normal muscle tone. Coordination normal. No atrophy Skin: Skin is warm and dry. No rash noted. not diaphoretic.  Psychiatric:  normal mood and affect. behavior is normal. Thought content normal.    Recent Labs: 03/16/2017: ALT 23 10/04/2017: BUN 18; Creatinine, Ser 0.70; Hemoglobin 14.3; Magnesium 2.2; Platelets 181; Potassium 3.5; Sodium 142; TSH 1.154    Lipid Panel Lab Results  Component Value Date   CHOL 175 08/27/2015   HDL 55.90 08/27/2015   LDLCALC 108 (H) 08/27/2015   TRIG 55.0 08/27/2015      Wt Readings from Last 3 Encounters:  12/17/17 192 lb (87.1 kg)  12/10/17 193 lb (87.5 kg)  11/02/17 198 lb (89.8 kg)       ASSESSMENT AND PLAN:  Essential hypertension, benign Long discussion concerning her hypertension Tolerating Bystolic 10 mg daily HCTZ  bothered by polyuria, Dry mouth and worried about low potassium. Would like to stop the medication  amlodipine Worried about leg swelling, does not feel it is controlling her blood pressure Reactions to losartan in  the past Feels that the Cardura was working but 2 mg was too strong Denies any side effects Recommended she hold HCTZ and holding amlodipine We'll restart Cardura 1 mg at nighttime continue bystolic 10 mg in the morning If blood pressure runs high could change Cardura to 1 mg twice a day HCTZ only as needed, amlodipine only as needed  Palpitations continue current dose 10 mg Symptoms well controlled  Anxiety Not sleeping well  chronic fatigue Not tolerating heavy workload at this time CAD weight to retire  Insomnia Chronic fatigue symptoms Recommended regular walking program  Dizziness Etiology unclear,  Less likely from medications given she often has dizziness symptoms and blood pressure is fine May need urology consultation   Total encounter time more than 25 minutes  Greater than 50% was spent in counseling and coordination of care with the patient   Disposition:   F/U  as needed   No orders of the defined types were placed in this encounter.    Signed, Esmond Plants, M.D., Ph.D. 12/17/2017  [place], [place]

## 2017-12-17 ENCOUNTER — Encounter: Payer: Self-pay | Admitting: *Deleted

## 2017-12-17 ENCOUNTER — Encounter: Payer: Self-pay | Admitting: Cardiovascular Disease

## 2017-12-17 ENCOUNTER — Ambulatory Visit (INDEPENDENT_AMBULATORY_CARE_PROVIDER_SITE_OTHER): Payer: No Typology Code available for payment source | Admitting: Cardiovascular Disease

## 2017-12-17 VITALS — BP 148/72 | HR 58 | Ht 64.0 in | Wt 192.0 lb

## 2017-12-17 DIAGNOSIS — R609 Edema, unspecified: Secondary | ICD-10-CM | POA: Diagnosis not present

## 2017-12-17 DIAGNOSIS — I10 Essential (primary) hypertension: Secondary | ICD-10-CM | POA: Diagnosis not present

## 2017-12-17 DIAGNOSIS — R55 Syncope and collapse: Secondary | ICD-10-CM | POA: Diagnosis not present

## 2017-12-17 DIAGNOSIS — F419 Anxiety disorder, unspecified: Secondary | ICD-10-CM

## 2017-12-17 MED ORDER — AMLODIPINE BESYLATE 2.5 MG PO TABS: 2.5000 mg | ORAL_TABLET | Freq: Every day | ORAL | 1 refills | Status: DC | PRN

## 2017-12-17 MED ORDER — DOXAZOSIN MESYLATE 1 MG PO TABS: 1.0000 mg | ORAL_TABLET | Freq: Every day | ORAL | 3 refills | Status: DC

## 2017-12-17 MED ORDER — DOXAZOSIN MESYLATE 1 MG PO TABS: 1.0000 mg | ORAL_TABLET | Freq: Two times a day (BID) | ORAL | 3 refills | Status: DC

## 2017-12-17 MED ORDER — HYDROCHLOROTHIAZIDE 25 MG PO TABS: 12.5000 mg | ORAL_TABLET | Freq: Every day | ORAL | 1 refills | Status: DC | PRN

## 2017-12-17 NOTE — Patient Instructions (Addendum)
Medication Instructions:   Stop the HCTZ, and amlodipine  Stay on bystolic in the morning Cardura 1 mg at night Monitor blood pressure If it runs high, Call the office  Take HCTZ only as needed for ankle/leg swelling  Labwork:  No new labs needed  Testing/Procedures:  No further testing at this time   Follow-Up: It was a pleasure seeing you in the office today. Please call us if you have new issues that need to be addressed before your next appt.  [phone]  Your physician wants you to follow-up in:  As needed  If you need a refill on your cardiac medications before your next appointment, please call your pharmacy.  For educational health videos Log in to : [email or web address] Or : [email or web address], password : [email or web address]

## 2017-12-21 ENCOUNTER — Telehealth: Payer: Self-pay | Admitting: Cardiovascular Disease

## 2017-12-21 NOTE — Telephone Encounter (Signed)
The dizziness has been a chronic issue  on the medications, off the medications, changing the medications Even when she stops most of her medications still dizzy, sitting position still dizzy Still dizzy  with normal blood pressure We are not getting low pressures concerning for orthostasis Don't know if we need different workup for dizziness, had MRI, etc Will cc Dr. [name]

## 2017-12-21 NOTE — Telephone Encounter (Signed)
Patient calling  States the BP medication she is taking has made her feel slightly dizzy and to have headaches Has tried this medication before and had the same problem - not sure this medication will work Calling to see if she can switch back to previous medication, has an event to go to this weekend and does not want to feel bad Please call to discuss

## 2017-12-21 NOTE — Telephone Encounter (Signed)
Patient states that she took 1/2 tablet of the cardura last night and this morning her blood pressure was 161/72. She did feel dizzy and have a headache. She then took another half of cardura. After her walk her BP was 140/70's. She is very upset because all of these blood pressure medications have been making her dizzy. She reports that she will be going back on the norvasc. Current blood pressures is 124/70 with pulse of 69. She agreed to continue the half tablet of cardura since it appears to be helping. She was appreciative for the call with no further questions at this time.

## 2017-12-21 NOTE — Telephone Encounter (Signed)
Patient instructed to monitor blood pressures and continue medications to let her body have time to get use to them. She was agreeable with this plan and will call if she should have any continued problems.

## 2017-12-25 ENCOUNTER — Telehealth: Payer: Self-pay | Admitting: Cardiovascular Disease

## 2017-12-25 NOTE — Telephone Encounter (Signed)
Up to her,she can stop the cardura She can go back on amloidpine 2.5 daily, HCTZ 25,  Stay on bystolic 10 This has worked though with minor side effects (dry mouth, polyuria, etc) But at least she should be able to work Disregard mildly elevated pressures for now on the three meds above Dizziness is not from the three listed above, If dizziness persists or she feels she is unable to work, needs to look for noncardiac etiologies

## 2017-12-25 NOTE — Telephone Encounter (Signed)
Spoke with patient and she has no energy and her balance is off. She reports dizziness and just doesn't feel good. She states that she will not be able to continue this medication. She states that it does help her blood pressures but she just doesn't feel like she can function. Reviewed that she should try to continue for now to see if her symptoms will subside. She verbalized understanding and states that she will give it another week and if not better she is going to stop it. Advised that I would make provider aware and to call back if her symptoms persist or worsen.

## 2017-12-25 NOTE — Telephone Encounter (Signed)
To Dr. Gollan to review.  

## 2017-12-25 NOTE — Telephone Encounter (Signed)
Pt is calling back with report of her being on Cardura. She states the BP medication is keeping her BP down for a period of time. States by the time it is due to take another pill, her BP has elevated again. States this medication makes her feel sick and dizzy, and lightheaded. She thinks this medication will not work for her.

## 2017-12-26 NOTE — Telephone Encounter (Signed)
Notified patient of Dr Donivan Scull recommendations. Patient talked in length about how she is very sensitive to all medications. She says when she first started amlodipine, she had similar symptoms but she continued to take it and the dizziness subsided. She did not like having the dry mouth on HCTZ. She would like to opt to stay on the Cardura another week or so to see if her body adjusts to it. She verbalized understanding that if dizziness persists, she may need to follow up with her PCP for noncardiac etiologies. She will stay on Cardura at this time and call us back with further update on her status in the future.

## 2017-12-28 ENCOUNTER — Telehealth: Payer: Self-pay | Admitting: Cardiovascular Disease

## 2017-12-28 NOTE — Telephone Encounter (Signed)
Spoke with patient and she states that she woke up and her BP was 170's and her medications were not helping it. She was dizzy and not feeling well. She said her BP has been up and down 140-160's all day. She had pain in her chest and wasn't sure if it was gas or something else. She continued medications and was doing better until today. BP was elevated, she was dizzy, laid down and rested. She went for walk and felt weird but was not dizzy. Reviewed that anxiety can certainly affect her blood pressures. Her mood was certainly heightened and reviewed how that can affect her pressures as well. She states that the first pressure was when she got up and it was not anxiety. Started reviewing her medications.    She has been taking 1/2 tablet Cardura twice daily and her Bystolic 10 mg once daily in the AM. Blood pressures today have been elevated and currently it is 177/67.   She has stopped taking the HCTZ and Norvasc. Reviewed her medications in detail and recommended that she try taking Bystolic in the morning and then the Cardura whole tablet in the  around dinner time. Instructed her to continue monitoring her blood pressures and to please call if these high readings persist. She verbalized understanding of our conversation, agreement with plan, and had no further questions.

## 2017-12-28 NOTE — Telephone Encounter (Signed)
Pt states her BP this a.m. Was 170/77 HR 59, states she felt dizzy. States she has not skipped any of her BP medication. States she went for a walk and it was 163/71 HR 75. Then later after she laid down it was 157/73 HR 62. She checked it again it went to 161/67 HR 61, and then finally went down to 152/74, she just took it and it was 162/73. Please call to discuss.

## 2017-12-31 ENCOUNTER — Telehealth: Payer: Self-pay | Admitting: Cardiovascular Disease

## 2017-12-31 NOTE — Telephone Encounter (Signed)
Pt calling stating she's starting feeling a bit better but now that she's feeling better the BP medication ( she's not taking a whole tablet) is not helping  She just happen to take her BP but states its usually around 170 to 174   She would like to just get some advise since this is now happening   Please call back

## 2018-01-01 ENCOUNTER — Telehealth: Payer: Self-pay | Admitting: Internal Medicine

## 2018-01-01 NOTE — Telephone Encounter (Signed)
FYI

## 2018-01-01 NOTE — Telephone Encounter (Signed)
Patient returning call.

## 2018-01-01 NOTE — Telephone Encounter (Signed)
Left voicemail message for patient to call back.

## 2018-01-01 NOTE — Telephone Encounter (Signed)
Spoke with patient and she states that she just had to stop the medication. She reports dizziness, nausea, and just not feeling well. SBP in the 170's, Legs feel heavy, Dizzy, Headache, Sickness to my stomach. She states that she is just having too many side effects and she is just not wanting to continue this medication. She states her boss sent her home until she can get herself together. Reviewed that other factors can be making her feel this way. She reports that when taking the whole pill of cardura it did help her blood pressures but she just can't deal with the medications. She is tearful and very upset about this whole thing. She almost fainted at work and states that her employer has put her out of work on leave for 2-3 weeks to get this straight and get her medications figured out. She states that she was also diagnosed "hypoglycemia and reactive hypoglycemia" Was placed on metformin and side effects were too much and she had to stop that. She states that when she goes for a walk she has to stop due to being off balance. Patient continued to talk about multiple medications, anxiety meds, and continued to discuss her sensitivity to medications. Reviewed again medication changes per Dr. Rockey Situ. She was agreeable to resume amlodipine, HCTZ, and stay on the bystolic. Reviewed that she should check with Dr. [name] as well. Spent 32 minutes on the phone with her and she sounded very anxious and repetitive. She verbalized understanding with no further questions.

## 2018-01-01 NOTE — Telephone Encounter (Signed)
Please advise 

## 2018-01-01 NOTE — Telephone Encounter (Signed)
Copied from [place] [id number]. Topic: Quick Communication - See Telephone Encounter >> Jan 01, 2018 12:28 PM Synthia Innocent wrote: CRM for notification. See Telephone encounter for: 01/01/18. FYI  Dr Garnet Koyanagi recommends patient come off of amLODipine (NORVASC) 2.5 MG tablet and doxazosin (CARDURA) 1 MG tablet instead, is having problems with cardura, causing headaches. Would like to continue with norvasc, ok per Dr Garnet Koyanagi.

## 2018-01-10 ENCOUNTER — Telehealth: Payer: Self-pay | Admitting: Cardiovascular Disease

## 2018-01-10 NOTE — Telephone Encounter (Signed)
Spoke to patient and she would like Dr Donivan Scull nurse Jule Ser) to call her regarding BP medication.  Apparently, they have been conversing about this matter.  Thank you.

## 2018-01-10 NOTE — Telephone Encounter (Signed)
Pt calling to make sure we didn't forget about her  Please call back

## 2018-01-10 NOTE — Telephone Encounter (Signed)
Pt is now aware Nicole Gould is not going to be here the rest of the week  She is willing to talk to any nurse

## 2018-01-10 NOTE — Telephone Encounter (Signed)
Pt c/o medication issue:  1. Name of Medication: Norvasc   2. How are you currently taking this medication (dosage and times per day)?  2.5 mg she's taking this once a day, last night she took an extra half tablet   3. Are you having a reaction (difficulty breathing--STAT)? No   4. What is your medication issue?  She states her BP during the day is fine and going towards the evening her BP tends to go back up She states the highest it has gotten (her systolic) 536   Would like some advise on this please call back

## 2018-01-10 NOTE — Telephone Encounter (Signed)
Spoke to patient who is concerned about the fluctuation in her BP, particularly in the evening where it has escalated to as much as 188/72.  She takes her BP medications in the morning, her BP is stable through the day ([id number] systolic), but by evening quite elevated. On the evening of 7/24, she had a BP 188/72 so took an additional 1.25mg  Norvasc.  This morning and throughout the afternoon she has felt dizzy and lightheaded 104/56.  She would like some advice on this medication sensitivity, please.  Thank you.Nicole Gould

## 2018-01-13 NOTE — Telephone Encounter (Signed)
Need to know what she is taking, ?meds Record numbers for a week to two and would send in with my chart  We need to look for trends

## 2018-01-14 NOTE — Telephone Encounter (Signed)
I spoke with the patient to advise her of Dr. Donivan Scull recommendations.   Per the patient:  1) Current BP meds: - Amlodipine 2.5 mg daily ( ~ 5:78 am) - Bystolic 10 mg daily- ( ~ 6:30 am) - HCTZ 25 mg- 1/2 tablet (12.5 mg) daily- (~ 6:30 am)  2) She states she does not use MyChart, but reports most recent BP readings:  Friday (7/26): - AM reading- ?/50's before her meds- she was nervous about her low DBP so she did not take her amlodipine that morning, she did take bystolic & hctz - she had a neuro appt that day and she was 120/70 (right) & 110/60 (left)  Saturday (7/27): - she took her bystolic & HCTZ (no amlodipine) - throughout the day she reports 132/60 (60's) 127/63 (60's) 135/71 (71) 120/59  Sunday (7/28): - she took bystolic, HCTZ, and amlodipine 1/2 tablet (1.25 mg) in the AM - she took amlodipine 1/2 tablet (1.25 mg) in the evening - 4 pm- 181/60  Today (7/29): - 158/80 (59)- no meds taken prior to this BP   Cardura is currently on her medication list, but the patient confirms she is not taken this as it makes her feel terrible.  I will remove this from her list.   The patient reports that she is currently out of work due to her dizziness. She states she has this on a daily basis, although at time she does have some reprieve from this. She walks 3 miles every morning (around 7 am). She does not feel worse with her dizziness after exercise. She does check her BP after exercise sometimes and she notes an appropriate increase in her BP with exercise.   She thinks overall she feels better on the norvasc, but has associated higher dose norvasc with a "weird" feeling in her chest.  She saw neurology on Friday due to the dizziness & headaches. An EEG was done. Per the patient, neuro felt that her issues were related to her medications and potential hypokalemia.  Last K+ was dated 10/04/17- 3.5 (listed under a magnesium level in Epic). The patient has been taking HCTZ  on a regular basis.  She stated neuro recommended that Dr. Rockey Situ may want to consider spironolactone.  I have advised the patient: 1) to please bring a detailed list of her BP readings (over the last week) and maybe through Tuesday of this week - list times of the day she took these  - list what medications she had had/ times  2) she is aware that prior to addition of a medication like spironolactone, her K+ should be rechecked. - will ask Dr. Rockey Situ is he is ok with a BMP being rechecked. - additional lab studies in April 2019 were ok, but will see if there is anything additional that would need to be checked with a BMP.   She is aware we will call her back regarding a lab draw and she is agreeable.

## 2018-01-16 NOTE — Telephone Encounter (Signed)
Would continue to track blood pressures Many of them seem within a reasonable range For markedly elevated blood pressure outside her normal range , suspect some other etiology such as situational or stress Would not add spironolactone as it will introduce potential other unwanted side effects If they are concerned about low potassium causing side effects, less likely, she could take low-dose over-the-counter potassium pill daily

## 2018-01-25 ENCOUNTER — Telehealth: Payer: Self-pay | Admitting: Internal Medicine

## 2018-01-25 NOTE — Telephone Encounter (Signed)
Left message for pt to call back  °

## 2018-01-25 NOTE — Telephone Encounter (Signed)
Patient states even with taking medication pantoprazole everyday, she still has symptoms every once in a while and would like to know if or when she needs to schedule a repeat egd.

## 2018-01-28 ENCOUNTER — Telehealth: Payer: Self-pay | Admitting: Cardiovascular Disease

## 2018-01-28 NOTE — Telephone Encounter (Signed)
Spoke with patient at length and reviewed recommendations by Dr. Rockey Situ. Patient reports that since taking the amlodipine at night she has noticed increased urination which is forceful at times. She states that it is causing her to get up multiple times at night. Recommended to try taking a little earlier but she states that she was taking at bedtime due to side effects of dizziness during the daytime. Reviewed that the HCTZ can cause some of those symptoms if taken close to bedtime as well. She would like me to please make Dr. Rockey Situ aware of her additional symptoms of frequent night time urination related to medication. Advised that I would route to him for review and recommendations. She was appreciative for the call with no further questions at this time.

## 2018-01-28 NOTE — Telephone Encounter (Signed)
See other telephone note. Medication Samples have been provided to the patient.  Drug name: Bystolic       Strength: 10 mg        Qty: 3 boxes  LOT: [id number]  Exp.Date: 06/21  Reviewed with patient that our samples are very limited for new start patients and those who require assistance. Discussed that if she continues on this medication that we will need to submit patient assistance paperwork if the copay card does not work for her. She verbalized understanding of this information and had no further questions. Please see other telephone note about some of her concerns. Coupon card also placed in bag with samples.

## 2018-01-28 NOTE — Telephone Encounter (Signed)
Patient did not return the call.

## 2018-01-28 NOTE — Telephone Encounter (Signed)
Patient calling the office for samples of medication:   1.  What medication and dosage are you requesting samples for? Bystolic 10 MG  2.  Are you currently out of this medication?   Patient also requested to speak with Pam - no further details were given upon request

## 2018-01-28 NOTE — Telephone Encounter (Signed)
Patients husband in to pick up samples for patient. He dropped off letter by patient which states as follows:   Blood Pressure for Nicole Gould 2.5 Norvasc in the evening about 7, pee all through the night 1/2 tablet of HTZ and 10 Bystolic in the morning after meal First day in the last two weeks I felt a little dizzy I walk in the morning and some evening 3 to 3.8 miles  Monday August 12th 153/69 62 AM 126/68 74 Mid Morning 122/65 65  135/73 59 12:23  Sunday August 11th 163/70 58 AM  Saturday August 10th 158/69 62 AM 147/71 57 Mid Morning 157/74 62 Afternoon 164/76 58 PM  Friday August 9th 152/67 64 AM 158/71 60 PM  Thursday August 8th 157/60 58 AM 140/73 55 Noon 152/70 62 Afternoon  Saturday August 3rd 151/66 58 AM 154/74 64 Mid Morning 154/70 58 Afternoon 159/72 57 PM

## 2018-01-29 NOTE — Telephone Encounter (Signed)
Spoke with patient and reviewed Dr. Donivan Scull recommendation to stop fluids at 7 pm to see if that will help with the urination during the night. She verbalized understanding with no further questions at this time.

## 2018-01-29 NOTE — Telephone Encounter (Signed)
Would stop fluids after 7 PM

## 2018-01-30 ENCOUNTER — Other Ambulatory Visit: Payer: Self-pay | Admitting: Cardiovascular Disease

## 2018-02-01 ENCOUNTER — Ambulatory Visit: Payer: No Typology Code available for payment source | Admitting: Psychology

## 2018-02-04 ENCOUNTER — Ambulatory Visit: Payer: No Typology Code available for payment source | Admitting: Surgery

## 2018-02-08 ENCOUNTER — Ambulatory Visit (INDEPENDENT_AMBULATORY_CARE_PROVIDER_SITE_OTHER): Payer: No Typology Code available for payment source | Admitting: Family Medicine

## 2018-02-08 ENCOUNTER — Encounter: Payer: Self-pay | Admitting: Family Medicine

## 2018-02-08 VITALS — BP 142/84 | HR 65 | Ht 64.0 in | Wt 190.0 lb

## 2018-02-08 DIAGNOSIS — R7303 Prediabetes: Secondary | ICD-10-CM

## 2018-02-08 DIAGNOSIS — G479 Sleep disorder, unspecified: Secondary | ICD-10-CM

## 2018-02-08 DIAGNOSIS — G43009 Migraine without aura, not intractable, without status migrainosus: Secondary | ICD-10-CM | POA: Diagnosis not present

## 2018-02-08 DIAGNOSIS — F39 Unspecified mood [affective] disorder: Secondary | ICD-10-CM

## 2018-02-08 DIAGNOSIS — S0990XS Unspecified injury of head, sequela: Secondary | ICD-10-CM

## 2018-02-08 DIAGNOSIS — N840 Polyp of corpus uteri: Secondary | ICD-10-CM | POA: Insufficient documentation

## 2018-02-08 DIAGNOSIS — F5101 Primary insomnia: Secondary | ICD-10-CM

## 2018-02-08 DIAGNOSIS — R42 Dizziness and giddiness: Secondary | ICD-10-CM

## 2018-02-08 DIAGNOSIS — F419 Anxiety disorder, unspecified: Secondary | ICD-10-CM

## 2018-02-08 DIAGNOSIS — G44309 Post-traumatic headache, unspecified, not intractable: Secondary | ICD-10-CM | POA: Diagnosis not present

## 2018-02-08 DIAGNOSIS — I10 Essential (primary) hypertension: Secondary | ICD-10-CM | POA: Diagnosis not present

## 2018-02-08 DIAGNOSIS — E669 Obesity, unspecified: Secondary | ICD-10-CM | POA: Diagnosis not present

## 2018-02-08 DIAGNOSIS — R419 Unspecified symptoms and signs involving cognitive functions and awareness: Secondary | ICD-10-CM

## 2018-02-08 MED ORDER — FLUOXETINE HCL 10 MG PO TABS
ORAL_TABLET | ORAL | 0 refills | Status: DC
Start: 2018-02-08 — End: 2018-03-08

## 2018-02-08 MED ORDER — TRAZODONE HCL 50 MG PO TABS: 25.0000 mg | ORAL_TABLET | Freq: Every evening | ORAL | 0 refills | Status: DC | PRN

## 2018-02-08 NOTE — Progress Notes (Signed)
New patient office visit note:  Impression and Recommendations:    1. Essential hypertension, benign   2. 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 Migraine without aura and without status migrainosus, not intractable   4. Obesity with body mass index 30 or greater   5. Prediabetes   6. Primary insomnia   7. Sleep disorder with cognitive complaints   8. Dizziness   9. Anxiety   10. Mood disorder ([place])     Pt was in the office today for 32.5+ minutes, with over 50% time spent in face to face counseling of patients various medical conditions, treatment plans of those medical conditions including medicine management and lifestyle modification, strategies to improve health and well being; and in coordination of care. SEE ABOVE TREATMENT PLAN FOR DETAILS  1. Specialty Care - Encouraged patient to continue seeking specialty care with cardiology, neurology, OBGYN.  - Continue to follow-up through cardiology and neurology, for her concerns regarding heart health, blood pressure, headaches, dizziness.  - Referral placed today for cardiology.  - Patient knows that she may obtain a referral for hypertension clinic whenever she desires.  2. Hypertension - Encouraged patient to follow-up with cardiology for further management.  - Lifestyle changes such as dash diet and engaging in a regular exercise program discussed with patient.  Educational handouts provided at patient's desire.  - Ambulatory BP monitoring encouraged. Keep log and bring in next OV  3. Mood - Prozac recommended to patient today for mood concerns. - Begin very low dose of Prozac - 10 mg tablet, starting with half tablet per day.  - Encouraged patient to establish with a life coach or therapist for additional assistance with mood and stress.  4. Insomnia - Begin Trazodone low dose.  5. Prediabetes - Patient with significant distress about prediabetic status.  Educated patient at length about prediabetes.   Extensive education and counseling provided to patient.  - Counseled patient on prevention of diabetes.  Discussed dietary and lifestyle modifications as first line of defense.  Importance of low carb/ketogenic diet discussed with patient in addition to regular exercise.   6. BMI Counseling Explained to patient what BMI refers to, and what it means medically.    Told patient to think about it as a "medical risk stratification measurement" and how increasing BMI is associated with increasing risk/ or worsening state of various diseases such as hypertension, hyperlipidemia, diabetes, premature OA, depression etc.  American Heart Association guidelines for healthy diet, basically Mediterranean diet, and exercise guidelines of 30 minutes 5 days per week or more discussed in detail.  Health counseling performed.  All questions answered.  7. Lifestyle & Preventative Health Maintenance - Advised patient to continue working toward exercising to improve overall mental, physical, and emotional health.    - Encouraged patient to engage in daily physical activity, especially a formal exercise routine.  Recommended that the patient eventually strive for at least 150 minutes of moderate cardiovascular activity per week according to guidelines established by the [facility].   - Healthy dietary habits encouraged, including low-carb, and high amounts of lean protein in diet.   - STRONGLY ENCOURAGED patient to consume adequate amounts of water - half of body weight in oz of water per day.   Education and routine counseling performed. Handouts provided.  Pt was in the office today for 50+ minutes, with over 50% time spent in face to face counseling of patients various medical conditions, treatment plans of those  medical conditions including medicine management and lifestyle modification, strategies to improve health and well being; and in coordination of care. SEE ABOVE TREATMENT PLAN FOR DETAILS   8. Follow-Up -  Return for fasting lab work and follow-up OV as recommended. - Patient agrees to keep an eye on her ounces of hydration between now and next appointment.  - Otherwise, return for yearly CPE and chronic OV as scheduled.   - Patient knows to call in PRN if experiencing acute concerns, or if she desires to come in sooner than scheduled to talk about other health goals.   Meds ordered this encounter  Medications  . FLUoxetine (PROZAC) 10 MG tablet    Sig: Use one half tablet daily for 8 days then increase to 1 tablet daily    Dispense:  90 tablet    Refill:  0  . traZODone (DESYREL) 50 MG tablet    Sig: Take 0.5 tablets (25 mg total) by mouth at bedtime as needed for sleep.    Dispense:  30 tablet    Refill:  0    Gross side effects, risk and benefits, and alternatives of medications discussed with patient.  Patient is aware that all medications have potential side effects and we are unable to predict every side effect or drug-drug interaction that may occur.  Expresses verbal understanding and consents to current therapy plan and treatment regimen.  Return for Follow-up in 4 weeks after starting these new medicines..  Please see AVS handed out to patient at the end of our visit for further patient instructions/ counseling done pertaining to today's office visit.    Note: This document was prepared using Dragon voice recognition software and may include unintentional dictation errors.  This document serves as a record of services personally performed by [name], DO. It was created on her behalf by Toni Amend, a trained medical scribe. The creation of this record is based on the scribe's personal observations and the provider's statements to them.   I have reviewed the above medical documentation for accuracy and completeness and I concur.  [name] 02/25/18 9:59  PM    ---------------------------------------------------------------------------------------------------------   Subjective:    Chief complaint:   Chief Complaint  Patient presents with  . Establish Care    HPI: Nicole Gould is a pleasant 57 y.o. female who presents to [facility] today to review their medical history with me and establish care.   I asked the patient to review their chronic problem list with me to ensure everything was updated and accurate.    All recent office visits with other providers, any medical records that patient brought in etc  - I reviewed today.     We asked pt to get [place] their medical records from [facility] providers/ specialists that they had seen within the past 3-5 years- if they are in private practice and/or do not work for [employer], [facility], [place], [place] or [employer] owned practice.  Told them to call their specialists to clarify this if they are not sure.   Reason for Establishing Care Patient discovered the clinic and lives here in [place] [place].  She was formerly going all the way to [facility], Dr. Deborra Medina with [place].  She did enjoy Dr. [name] but wishes to see someone local.  Notes in March, she got a phone call saying that she was prediabetic.  States "everything went downhill after that," because this news freaked her out.  Went to  see an endocrinologist, and then went to see another (Dr. Chalmers Cater) endocrinologist to confirm the diagnosis.  She also has a cardiologist.  Patient expresses confusion about prediabetic status and seeks clarity today.  Social History Lives in [facility] [place] [facility]. Just moved down from [place]. Married to husband Serria Sloma.  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 Most of her time was spent in [place]. 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   Currently works at [employer].  Patient has been working  on weight loss.  She has lost 12 lbs so far.  Family History Notes that her sister died at age 51 of aggressive dementia. Notes that two of her cousins have dementia as well, related to her mother's side.  Surgical History Past Surgical History:  Procedure Laterality Date  . COLONOSCOPY    . DILATION AND CURETTAGE OF UTERUS  09/08/2014   with hystereoscopy  . POLYPECTOMY    . TONSILLECTOMY    . TUBAL LIGATION     Past Medical History Patient has worked in healthcare for 30 years.  Notes she doesn't like taking medication.  "Not that I'm non-compliant, I just hate taking medications."  Notes she's very very sensitive to medication and often experiences side-effects.  - Mood Notes she had an episode at work where she almost passed out (while taking her original blood pressure medication, which patient fears made her extremely dizzy and faint).  Patient then had a moment where she ordered the wrong X-ray at work.  Her boss subsequently took the patient out of work for six weeks because she's had "too much going on."  Patient has fears about returning to work given her recent experience.  - OBGYN - Insomnia/Menopause Follows up with Dr. Rogue Bussing.  Patient has been experiencing hot flashes and nighttime urination very commonly lately. Husband wonders if some of her dizziness is due to menopause.  - Prediabetes Patient notes a history of HbA1c measurements, 5.7 and 5.8.  Patient tried metformin.  Notes "it felt like I was having an out of body experience."  Experienced feelings of dizziness and "like I was going to pass out."  So she discontinued metformin.  - Cardiology Started having heart palpitations/heart flutters at age 9. Did echos and treadmills at [facility] in [place]. Dizziness, heart palpitations, but negative cardiology workup.  Was eventually placed on Tenormin to regulate her heart flutter.  After moving down here from [place], Dr. [name] referred her to Dr.  Earley Favor over with [facility].  He put her on Bystolic in place of Tenormin, as on Tenormin she was having breakthrough beats.  Patient still sees cardiology regularly.  - Hypertension Notes that her blood pressure started going up.  This has been managed in the past by both cardiology and her former PCP.  Patient was originally put on carvedilol; notes that this made her sick as a dog.  Patient is currently being treated on Norvasc and HCTZ. States that her systolic blood pressure hovers in 150-160, sometimes 170.  - Chronic Dizzy Spells Patient experienced exacerbated dizziness and feeling like she wanted to faint while she was being treated for blood sugar and blood pressure.  When she is dizzy, she checks her blood sugar, it runs 90 to 101.  She continues to feel dizzy and lightheaded a lot.  When she goes walking early in the morning, this exacerbates her dizziness.  Had a workup with Neurology through [facility], Dr. Theda Sers, and  his team (seeing a PA).  She had an EEG done, it was negative.  Patient believes that her blood pressure medicine is causing her dizziness.  Patient is not sure how much water she drinks per day.   - Chronic Headaches - h/o Concussion/Head Injury In 2013, she had a bad head concussion at work.  Was hit hard by a chair and "was in [place] for three months."  Still has headaches and piercing pain in her head.  Patient has had several MRI and CT of her head.  Notes they don't see anything.   Wt Readings from Last 3 Encounters:  02/08/18 190 lb (86.2 kg)  12/17/17 192 lb (87.1 kg)  12/10/17 193 lb (87.5 kg)   BP Readings from Last 3 Encounters:  02/08/18 (!) 142/84  12/17/17 (!) 148/72  12/10/17 120/68   Pulse Readings from Last 3 Encounters:  02/08/18 65  12/17/17 (!) 58  12/10/17 67   BMI Readings from Last 3 Encounters:  02/08/18 32.61 kg/m  12/17/17 32.96 kg/m  12/10/17 33.13 kg/m    Patient Care Team    Relationship Specialty  Notifications Start End  [name], DO PCP - General Family Medicine  02/08/18   Minna Merritts, MD Consulting Physician Cardiology  03/14/13   [name], MD  Internal Medicine  02/08/18   Minna Merritts, MD Consulting Physician Cardiology  02/08/18   Malon Kindle, MD Referring Physician Neurology  02/08/18   Pyrtle, Lajuan Lines, MD Consulting Physician Gastroenterology  02/08/18   Jacelyn Pi, MD Consulting Physician Endocrinology  02/08/18   Allyn Kenner, DO Consulting Physician Obstetrics and Gynecology  02/08/18     Patient Active Problem List   Diagnosis Date Noted  . Polyp of corpus uteri 02/08/2018  . Hx of seizure disorder 11/10/2017  . Migraine without aura and without status migrainosus, not intractable 11/10/2017  . Prediabetes 11/10/2017  . Spells of trembling 11/10/2017  . Near syncope 10/13/2017  . Sinusitis 07/16/2017  . Edema 04/29/2017  . 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 Dizziness 10/08/2016  . Obesity with body mass index 30 or greater 08/09/2016  . Vaginal discharge 08/09/2016  . History of adenomatous polyp of colon 08/04/2016  . 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 Menopausal symptom 10/25/2014  . Primary insomnia 05/05/2014  . Anxiety 05/05/2014  . Chronic venous insufficiency 04/21/2014  . Adenomatous polyp of colon 05/12/2013  . Visit for preventive health examination 03/14/2013  . Pain 03/11/2013  . Essential hypertension, benign 01/28/2013  . Vulvitis 01/28/2013  . 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 Chicken pox   . Colon polyps   . Concussion   . Frequent headaches   . GERD (gastroesophageal reflux disease)   . Hypertension   . Migraines   . Pre-eclampsia    with grand mal seizures  . Seizure disorder in pregnancy ([place])   . Seizures ([place])    with pregnancy 1981-  pre clampsia      Past Medical History:  Diagnosis Date  . Chicken pox   . Colon polyps   . Concussion   . Frequent headaches   . GERD (gastroesophageal reflux disease)   . Hypertension   . Migraines   . Pre-eclampsia    with grand mal seizures  . Seizure disorder in pregnancy ([place])   . Seizures ([place])  with pregnancy 1981- pre clampsia      Past Surgical History:  Procedure Laterality Date  . COLONOSCOPY    . DILATION AND CURETTAGE OF UTERUS  09/08/2014   with hystereoscopy  . POLYPECTOMY    . TONSILLECTOMY    . TUBAL LIGATION       Family History  Problem Relation Age of Onset  . Hypertension Mother   . Diabetes Mother   . Heart disease Father   . Kidney disease Father   . Hypertension Father   . Diabetes Father   . Cancer Maternal Grandfather        colon cancer  . Colon cancer Maternal Grandfather        passed age 51   . Colon polyps Neg Hx   . Breast cancer Neg Hx      Social History   Substance and Sexual Activity  Drug Use No     Social History   Substance and Sexual Activity  Alcohol Use Yes   Comment: very rare - glass red wine when that      Social History   Tobacco Use  Smoking Status Never Smoker  Smokeless Tobacco Never Used     Current Meds  Medication Sig  . hydrochlorothiazide (HYDRODIURIL) 25 MG tablet Take 0.5 tablets (12.5 mg total) by mouth daily as needed.  Marland Kitchen MAGNESIUM MALATE PO Take 1 tablet by mouth daily.   . Multiple Vitamins-Minerals (MULTIVITAMIN WITH MINERALS) tablet Take 1 tablet by mouth daily.  . nebivolol (BYSTOLIC) 10 MG tablet Take 1 tablet (10 mg total) by mouth daily.  . Omega-3 Fatty Acids (FISH OIL) 1200 MG CAPS Take 1,200 mg by mouth daily.   . pantoprazole (PROTONIX) 40 MG tablet Take 1 tablet (40 mg total) by mouth daily before breakfast.  . [DISCONTINUED] ALPRAZolam (XANAX) 0.25 MG tablet Take 1 tablet (0.25 mg total) by mouth 2 (two) times daily as needed for anxiety.  . [DISCONTINUED]  amLODipine (NORVASC) 2.5 MG tablet TAKE 1 TABLET BY MOUTH EVERY DAY AS NEEDED    Allergies: Losartan; Anesthetics, amide; Benadryl [diphenhydramine hcl (sleep)]; Benicar [olmesartan]; Cardura [doxazosin mesylate]; Codeine; Fentanyl; Losartan potassium-hctz; Metformin and related; and Prilocaine   Review of Systems  Constitutional: Negative for chills, diaphoresis, fever, malaise/fatigue and weight loss.  HENT: Negative for congestion, sore throat and tinnitus.   Eyes: Negative for blurred vision, double vision and photophobia.  Respiratory: Negative for cough and wheezing.   Cardiovascular: Negative for chest pain and palpitations.  Gastrointestinal: Negative for blood in stool, diarrhea, nausea and vomiting.  Genitourinary: Negative for dysuria, frequency and urgency.       Chronic night-time urination.  Musculoskeletal: Negative for joint pain and myalgias.  Skin: Negative for itching and rash.  Neurological: Positive for headaches (chronic). Negative for dizziness, focal weakness and weakness.  Endo/Heme/Allergies: Negative for environmental allergies and polydipsia. Does not bruise/bleed easily.  Psychiatric/Behavioral: Positive for memory loss (chronic). Negative for depression. The patient has insomnia (chronic). The patient is not nervous/anxious.      Objective:   Blood pressure (!) 142/84, pulse 65, height 5\' 4"  (1.626 m), weight 190 lb (86.2 kg), last menstrual period 12/01/2013, SpO2 99 %. Body mass index is 32.61 kg/m. General: Well Developed, well nourished, and in no acute distress.  Neuro: Alert and oriented x3, extra-ocular muscles intact, sensation grossly intact.  HEENT:[place]/AT, PERRLA, neck supple, No carotid bruits Skin: no gross rashes  Cardiac: Regular rate and rhythm Respiratory: Essentially clear to auscultation bilaterally. Not using  accessory muscles, speaking in full sentences.  Abdominal: not grossly distended Musculoskeletal: Ambulates w/o diff, FROM * 4  ext.  Vasc: less 2 sec cap RF, warm and pink  Psych:  No HI/SI, judgement and insight good, Euthymic mood. Full Affect.    No results found for this or any previous visit (from the past 2160 hour(s)).

## 2018-02-08 NOTE — Patient Instructions (Addendum)
Please give her a note stating she can return to work next week but only half days for 1 week - then can return full-time the following week    Weaning onto [place] slowly will help to minimize incidence of side-effects. Please begin with one half tablet (5 mg) for 8 days. After 8 days of half tablet, increase dose to one full tablet (10 mg) for 8 days.  Also after you are on the Prozac and tolerating it well for a couple weeks then move to take trazodone half a tablet nightly.  Please obtain a behavioral health specialist\life coach.  Please use the list below as a guidance.  Call them and make an appointment.  I think this would be very helpful for you.  I would recommend you start at once weekly or once every 2 weeks      Behavioral Health/ Counseling Referrals    Anderson Malta, personal counselor in [place], specializing in marriage counseling    Dr. Tomi Bamberger, PHD Dr. Tomi Bamberger, PHD is a counselor in [place], [place].  [address] [place], [place] [address] [place] [phone]   Kristie [place], [place]  [phone] [email or web address] ( she does [place] and faith-based coaching and counseling )    [employer] ( faith-based counseling ) Address: [place]. [place], [place] [address] [phone] [address] for appointments [phone] Fax Hours: Monday - Thursday 8:00am-6:00pm Closed for lunch 12-1Thursday only Friday: Closed all day   Steffanie Rainwater: [phone] or Meg Martinique- 336- [phone] -counselors in [place] who are faith based    [facility] Nunzio Cobbs, [place], ACSW, M.ED.  -Nunzio Cobbs is a licensed clinical social worker in practice over 71 years and with Dr. Chucky May for the last 10 years.  -She sees adults, adolescents, children & families and couples. -Services are provided for mood and anxiety  disorders, marital issues, family or parent/child problems, parenting, co-dependency, gender issues, trauma, grief, and stages of life issues. She also provides critical incident stress debriefing.  -Pamala Hurry accepts many employee assistance programs (EAP), [employer], Pharmacist, hospital.  PHONE  [phone]                FAX [phone]   Rodena Goldmann [email or web address] [employer]- gen counseling;  PHD   Wilber Oliphant, MSW [address] [place]   [facility] Apolonio Schneiders, PhD [address], [place] [phone]   [facility]- children [address] [place], [place] [phone]   Lanelle Bal Professional Counselor Counseling and [employer] [place] abuse [facility] Manager [address] [place] [phone] [place] [address], [place] Delmer Islam, PhD **   -adult, adolescent, and child Oneida Arenas, PhD Leitha Bleak, LCSW Jillene Bucks, PhD-child, adolescent and adults   [facility] [address], [place] [phone] Merilyn Baba, MS-child, adolescent and adults Lennart Pall, PhD-adolescent and adults   [facility]-grief, terminal illness [place], [place] [address], [employer] [address] [place], [place]   [facility] [address] [place], [place]   [facility] of the [place]  Dr, Starling Manns [phone]   [facility] [address],  [address], [place]   [facility] [address] [place] [address], [place]   Please realize, EXERCISE IS MEDICINE!  -  American Heart Association [facility]) guidelines for exercise : If you are in good health, without any medical conditions, you should engage in 150 minutes of moderate intensity aerobic activity per week.  This means you should be huffing and puffing throughout your workout.   Engaging in regular exercise will improve brain function and memory, as well as improve mood, boost immune system and help with weight management.  As well as the other, more well-known effects of exercise such as decreasing blood sugar levels, decreasing blood pressure,  and decreasing bad cholesterol levels/ increasing good cholesterol levels.     -  The AHA strongly endorses consumption of a diet that contains a variety of foods from all the food categories with an emphasis on fruits and vegetables; fat-free and low-fat dairy products; cereal and grain products; legumes and nuts; and fish, poultry, and/or extra lean meats.    Excessive food intake, especially of foods high in saturated and trans fats, sugar, and salt, should be avoided.    Adequate water intake of roughly 1/2 of your weight in pounds, should equal the ounces of water per day you should drink.  So for instance, if you're 200 pounds, that would be 100 ounces of water per day.         Mediterranean Diet  Why follow it? Research shows  Those who follow the Mediterranean diet have a reduced risk of heart disease   The diet is associated with a reduced incidence of Parkinson's and Alzheimer's diseases  People following the diet may have longer life expectancies and lower rates of chronic diseases   The Dietary Guidelines for Americans recommends the Mediterranean diet as an eating plan to promote health and prevent disease  What Is the Mediterranean Diet?   Healthy eating plan based  on typical foods and recipes of Mediterranean-style cooking  The diet is primarily a plant based diet; these foods should make up a majority of meals   Starches - Plant based foods should make up a majority of meals - They are an important sources of vitamins, minerals, energy, antioxidants, and fiber - Choose whole grains, foods high in fiber and minimally processed items  - Typical grain sources include wheat, oats, barley, corn, brown rice, bulgar, farro, millet, polenta, couscous  - Various types of beans include chickpeas, lentils, fava beans, black beans, white beans   Fruits  Veggies - Large quantities of antioxidant rich fruits & veggies; 6 or more servings  - Vegetables can be eaten raw or lightly drizzled with oil and cooked  - Vegetables common to the traditional Mediterranean Diet include: artichokes, arugula, beets, broccoli, brussel sprouts, cabbage, carrots, celery, collard greens, cucumbers, eggplant, kale, leeks, lemons, lettuce, mushrooms, okra, onions, peas, peppers, potatoes, pumpkin, radishes, rutabaga, shallots, spinach, sweet potatoes, turnips, zucchini - Fruits common to the Mediterranean Diet include: apples, apricots, avocados, cherries, clementines, dates, figs, grapefruits, grapes, melons, nectarines, oranges, peaches, pears, pomegranates, strawberries, tangerines  Fats - Replace butter and margarine with healthy oils, such as olive oil, canola oil, and tahini  - Limit nuts to no more than a handful a day  - Nuts include walnuts, almonds, pecans, pistachios, pine nuts  - Limit or avoid candied, honey roasted or heavily salted nuts - Olives are central to  the Mediterranean diet - can be eaten whole or used in a variety of dishes   Meats Protein - Limiting red meat: no more than a few times a month - When eating red meat: choose lean cuts and keep the portion to the size of deck of cards - Eggs: approx. 0 to 4 times a week  - Fish and lean poultry: at least 2 a week  -  Healthy protein sources include, chicken, [place], lean beef, lamb - Increase intake of seafood such as tuna, salmon, trout, mackerel, shrimp, scallops - Avoid or limit high fat processed meats such as sausage and bacon  Dairy - Include moderate amounts of low fat dairy products  - Focus on healthy dairy such as fat free yogurt, skim milk, low or reduced fat cheese - Limit dairy products higher in fat such as whole or 2% milk, cheese, ice cream  Alcohol - Moderate amounts of red wine is ok  - No more than 5 oz daily for women (all ages) and men older than age 45  - No more than 10 oz of wine daily for men younger than 75  Other - Limit sweets and other desserts  - Use herbs and spices instead of salt to flavor foods  - Herbs and spices common to the traditional Mediterranean Diet include: basil, bay leaves, chives, cloves, cumin, fennel, garlic, lavender, marjoram, mint, oregano, parsley, pepper, rosemary, sage, savory, sumac, tarragon, thyme   Its not just a diet, its a lifestyle:   The Mediterranean diet includes lifestyle factors typical of those in the region   Foods, drinks and meals are best eaten with others and savored  Daily physical activity is important for overall good health  This could be strenuous exercise like running and aerobics  This could also be more leisurely activities such as walking, housework, yard-work, or taking the stairs  Moderation is the key; a balanced and healthy diet accommodates most foods and drinks  Consider portion sizes and frequency of consumption of certain foods   Meal Ideas & Options:   Breakfast:  o Whole wheat toast or whole wheat English muffins with peanut butter & hard boiled egg o Steel cut oats topped with apples & cinnamon and skim milk  o Fresh fruit: banana, strawberries, melon, berries, peaches  o Smoothies: strawberries, bananas, greek yogurt, peanut butter o Low fat greek yogurt with blueberries and granola  o Egg white  omelet with spinach and mushrooms o Breakfast couscous: whole wheat couscous, apricots, skim milk, cranberries   Sandwiches:  o Hummus and grilled vegetables (peppers, zucchini, squash) on whole wheat bread   o Grilled chicken on whole wheat pita with lettuce, tomatoes, cucumbers or tzatziki  o Tuna salad on whole wheat bread: tuna salad made with greek yogurt, olives, red peppers, capers, green onions o Garlic rosemary lamb pita: lamb sauted with garlic, rosemary, salt & pepper; add lettuce, cucumber, greek yogurt to pita - flavor with lemon juice and black pepper   Seafood:  o Mediterranean grilled salmon, seasoned with garlic, basil, parsley, lemon juice and black pepper o Shrimp, lemon, and spinach whole-grain pasta salad made with low fat greek yogurt  o Seared scallops with lemon orzo  o Seared tuna steaks seasoned salt, pepper, coriander topped with tomato mixture of olives, tomatoes, olive oil, minced garlic, parsley, green onions and cappers   Meats:  o Herbed greek chicken salad with kalamata olives, cucumber, feta  o Red bell peppers stuffed with spinach, bulgur, lean  ground beef (or lentils) & topped with feta   o Kebabs: skewers of chicken, tomatoes, onions, zucchini, squash  o [place] burgers: made with red onions, mint, dill, lemon juice, feta cheese topped with roasted red peppers  Vegetarian o Cucumber salad: cucumbers, artichoke hearts, celery, red onion, feta cheese, tossed in olive oil & lemon juice  o Hummus and whole grain pita points with a greek salad (lettuce, tomato, feta, olives, cucumbers, red onion) o Lentil soup with celery, carrots made with vegetable broth, garlic, salt and pepper  o Tabouli salad: parsley, bulgur, mint, scallions, cucumbers, tomato, radishes, lemon juice, olive oil, salt and pepper.

## 2018-02-21 ENCOUNTER — Telehealth: Payer: Self-pay

## 2018-02-21 ENCOUNTER — Telehealth: Payer: Self-pay | Admitting: Cardiovascular Disease

## 2018-02-21 NOTE — Telephone Encounter (Signed)
Good Morning [name], Can you please call [name] and share- She has only been on Fluoxetine (Prozac) for 8 days, 1/2 tab of 10mg  tablet. Since she has been on such a low dose for such a short period, she can just stop taking it. If she is still experiencing lower extremity heaviness, imbalance, and dizziness 3 days after stopping Fluoxetine, please tell her to call the clinic. Please encourage her to f/u with Dr. Raliegh Scarlet on 03/08/18. Thanks! Valetta Fuller

## 2018-02-21 NOTE — Telephone Encounter (Signed)
She states that her blood pressures are remaining systolic in the 811'W and up to 180's if she waits after 5 pm to take her amlodipine. She wants to know if she could try increasing the amlodipine slightly to see if that will help control it better. Advised that I would route this over to provider for review and recommendations. She confirmed taking amlodipine 2.5 mg daily and Bystolic 10 mg daily. Advised that I would call back with any changes. She verbalized understanding with no further questions.

## 2018-02-21 NOTE — Telephone Encounter (Signed)
Patient notified and expressed understanding.  Patient will follow up in 3 days if needed. MPulliam, CMA/RT(R)

## 2018-02-21 NOTE — Telephone Encounter (Signed)
Pt c/o medication issue:  1. Name of Medication: Norvasc   2. How are you currently taking this medication (dosage and times per day)?  2.5 mg in the evening   3. Are you having a reaction (difficulty breathing--STAT)?  No  4. What is your medication issue?  Seems like her body has gotten used to medication and body is not responding much  BP is higher again  She would like to increase the dose just a little bit    Pt c/o BP issue: STAT if pt c/o blurred vision, one-sided weakness or slurred speech  1. What are your last 5 BP readings?   02/20/18  9:30 pm 181/74 HR 62 02/14/18  7:30 am 145/67 HR 72  Evening  185/76 HR 58 02/12/18 9 am    131/67 HR 62 12 pm  142/71 HR 61  6 pm 165/74 HR 58  02/11/18 2:37 pm  121/69 HR 89   2. Are you having any other symptoms (ex. Dizziness, headache, blurred vision, passed out)?   3. What is your BP issue?  It is running high again

## 2018-02-21 NOTE — Telephone Encounter (Signed)
Patient called states that since starting Prozac she is experiencing heaviness in her legs, off balance, and dizziness in the mornings. Patient was seen on 02/08/18 and started Prozac 10 mg on 02/09/18 at 1/2 tab daily for 8 days then increase to 1 tab daily as tolerated.  Patient states that she has started the whole tablet daily.  Patient states that symptoms started 4-5 days ago. Patient would like to come off of the Prozac but wanted to know if she could just stop or if she needed to ween down. Patient denies headaches or SOB.  Patient states that she did have some slight chest pressure yesterday, that resolved and she has had in the past when BP has been elevated.  Patient states that BP has been running high with systolic in the 354T.  Patient takes amlodipine 2.5 mg nightly at 5pm.  Patient sees a cardiology and Dr. Raliegh Scarlet recommended for her to continue to see them for BP management.   Patient has a follow up with Dr. Raliegh Scarlet on 03/08/2018. Please review and advise.   MPulliam, CMA/RT(R)

## 2018-02-22 NOTE — Telephone Encounter (Signed)
Spoke with patient and reviewed recommendations by provider. Discussed taking additional amlodipine and to monitor for swelling. Instructed her to please call if she is having to take that extra pill frequently because we may need to update her prescription to allow for additional testing. She verbalized understanding of all recommendations and had no further questions at this time.

## 2018-02-22 NOTE — Telephone Encounter (Signed)
Pressures are always fine in the morning and afternoon and high in the evening Concerning for stressors Would try a regular walking the afternoon for exercise might help pressures She can certainly take extra amlodipine 2.5 mg if needed but high doses can cause leg swelling

## 2018-02-23 ENCOUNTER — Other Ambulatory Visit: Payer: Self-pay | Admitting: Cardiovascular Disease

## 2018-02-25 NOTE — Telephone Encounter (Signed)
Patient was taking Amlodipine 2.5 mg once daily and due to elevated pressures she wanted to try extra dose of amlodipine. Dr. Rockey Situ reviewed and was agreeable with her trying this dosage to see if it works for her. Prescription sent in with updated instructions.

## 2018-02-25 NOTE — Telephone Encounter (Signed)
Please advise correct instructions for Amlodipine. Pt has been off and on with Amlodipine. Pt requested increasing dose of Amlodipine for elevated BP.

## 2018-03-07 DIAGNOSIS — F39 Unspecified mood [affective] disorder: Secondary | ICD-10-CM | POA: Insufficient documentation

## 2018-03-07 NOTE — Progress Notes (Signed)
Impression and Recommendations:    1. Mood disorder (Nicole Gould)   2. Histrionic personality (Nicole Gould)   3. Sleep disorder with cognitive complaints   4. Essential hypertension, benign   5. Obesity with body mass index 30 or greater   6. Prediabetes   7. Noncompliance with diet and medication regimen     1. Mood disorder (HCC)/  H/O Histrionic personality (Nicole Gould) - Again recommended to obtain counselor for CBT;   - pt Unable to tol prozac - pt declines that she needs meds at this time - exercise QD - healthy eating d/c pt - she was placed on part-time status by her boss.    3. Sleep disorder with cognitive complaints - well controlled at current due to reduced stress at work - NEver started trazadone  - no S-E noted  4. 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 Obesity with body mass index 30 or greater - encouraged to cont wt loss - down almost 12-14 lbs since first seen  6. Prediabetes - seen and managed by Endo- Dr Nicole Gould - encouraged to cont wt loss and home monitoring - f/up sooner than planned with them if new sx or concerns    Education and routine counseling performed. Handouts provided.  Was told: Pt will get FBW and then a f/up OV with me one week later  Please make sure you get [place] all the blood work\medical records of all of your specialist as well.   -You see Dr. Karl Bales for your blood pressure and cardiovascular health issues of [facility].  As we talked about last time since you have had so many allergies to various medicines and they been working with you for a long time, it is best you follow-up with them regarding your blood pressure and cardiovascular health issues.      -   Also please make sure you get as Dr. Nino Gould as well as all your other specialists medical records  including labs etc.  We do not have any labs on you and we recommended to come in last office visit prior to this 1 and you forgot so please prior to next office visit come in for fasting blood work a week before the visit then we will discuss it at your follow-up office visit   Medications Discontinued During This Encounter  Medication Reason  . FLUoxetine (PROZAC) 10 MG tablet Discontinued by provider  . metFORMIN (GLUCOPHAGE) 500 MG tablet   . traZODone (DESYREL) 50 MG tablet      The patient was counseled, risk factors were discussed, anticipatory guidance given.  Pt was in the office today for 32.5+ minutes, with over 50% time spent in face to face counseling of patients various medical conditions, treatment plans of those medical conditions including medicine management and lifestyle modification, strategies to improve health and well being; and in coordination of care. SEE ABOVE TREATMENT PLAN FOR DETAILS  Gross side effects, risk and benefits, and alternatives of medications discussed with patient.  Patient is aware that all medications have potential side effects and we are unable to predict every side effect or drug-drug interaction that may occur.  Expresses verbal understanding and consents to current therapy plan and treatment regimen.  Return for  4-6 mo follow-up with me for  chronic management of disease as we treat.; get fasting blood work o.  Please see AVS handed out to patient at the end of our visit for further patient instructions/ counseling done pertaining to today's office visit.    Note:  This document was prepared using Dragon voice recognition software and may include unintentional dictation errors.    [name] 03/08/18 1:49 PM   Subjective:    HPI: Nicole Gould is a 57 y.o. female who presents to [place] [facility] today for follow up for HTN, and others     Last OV was first time meeting pt on 02/08/18.   She was told to come  in for FBW prior to this appt and DID NOT DO SO.     Obesity:  Saw Dr Chalmers Cater recently -  Sees her for Pre-Diabetes mgt.   Metformin- pt could not tolerate.   Was put on dietary restrictions by her -   30 gram TID and 15gr sancks twice daily, don't eat sugar and white flour products.     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    Depression:  Last OV started pt on prozac.  Was having a lot of problems at work.  Could not tolerate meds at all.   Per pt she has "poor tolerance to all meds".    Feeling much better - her boss took her out of work for two months.  Then put her at 2 days per week.    Was told last OV to obtain behavorial health speciliast/ life coach and did not   Insomnia:  - started trazadone last OV.    She got scared to take it and never started.  Since she was home for 2 months and taken out of work by her boss, she sleeping much better now due to much less stress.   HTN: - Pt has Cards doc and was told last OV to call them and to f/up in HTN clinic!!!   - Last OV we:  Asked pt to bring in BP log from home.   Pt never did so- and never contacted cardiology for f/up OV!   -  Her blood pressure has been controlled at home.  Patient's blood pressures been running 110/58 at the lowest but mostly runs around 120s-130s over 70s-80s.  Highest occasionally is in the 161W systolic over 96E.  - Patient reports good compliance with blood pressure medications  - Denies medication S-E   - Smoking Status noted   - She denies new onset of: chest pain, exercise intolerance, shortness of breath, dizziness, visual changes, headache, lower extremity swelling or claudication.    Last 3 blood pressure readings in our office are as follows: BP Readings from Last 3 Encounters:  04/10/18 (!) 152/84  03/21/18 (!) 160/78  03/08/18 132/78    Pulse Readings from Last 3  Encounters:  04/10/18 60  03/21/18 60  03/08/18 61    Filed Weights   03/08/18 1015  Weight: 185 lb 11.2 oz (84.2 kg)    She has is followed by Neurology, Endocrinology, Cardiology, GYN and GI, and she recently established with me in 01/2018.  She estimates to drink >90 oz water/day, follows a diabetic diet and has not been exercising this week, b/c " I just feel off".      Patient Care  Team    Relationship Specialty Notifications Start End  [name], DO PCP - General Family Medicine  02/08/18   Minna Merritts, MD Consulting Physician Cardiology  03/14/13   [name], MD  Internal Medicine  02/08/18   Malon Kindle, MD Referring Physician Neurology  02/08/18   Jerene Bears, MD Consulting Physician Gastroenterology  02/08/18   Jacelyn Pi, MD Consulting Physician Endocrinology  02/08/18   Allyn Kenner, DO Consulting Physician Obstetrics and Gynecology  02/08/18      Lab Results  Component Value Date   CREATININE 0.77 04/19/2018   BUN 16 04/19/2018   NA 143 04/19/2018   K 4.3 04/19/2018   CL 105 04/19/2018   CO2 23 04/19/2018    Lab Results  Component Value Date   CHOL 161 04/19/2018   CHOL 175 08/27/2015   CHOL 176 09/16/2014    Lab Results  Component Value Date   HDL 52 04/19/2018   HDL 55.90 08/27/2015   HDL 49.40 09/16/2014    Lab Results  Component Value Date   LDLCALC 98 04/19/2018   LDLCALC 108 (H) 08/27/2015   LDLCALC 111 (H) 09/16/2014    Lab Results  Component Value Date   TRIG 54 04/19/2018   TRIG 55.0 08/27/2015   TRIG 77.0 09/16/2014    Lab Results  Component Value Date   CHOLHDL 3.1 04/19/2018   CHOLHDL 3 08/27/2015   CHOLHDL 4 09/16/2014    Lab Results  Component Value Date   LDLDIRECT 113.0 09/16/2014   ===================================================================   Patient Active Problem List   Diagnosis Date Noted  . Prediabetes 11/10/2017    Priority: High  . Essential hypertension,  benign 01/28/2013    Priority: High  . Mood disorder ([place]) 03/07/2018    Priority: Medium  . Histrionic personality ([place]) 04/29/2017    Priority: Medium  . Primary insomnia 05/05/2014    Priority: Medium  . Hx of seizure disorder 11/10/2017    Priority: Low  . Migraine without aura and without status migrainosus, not intractable 11/10/2017    Priority: Low  . Spells of trembling 11/10/2017    Priority: Low  . 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 Chronic venous insufficiency 04/21/2014    Priority: Low  . 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 Noncompliance with diet and medication regimen 04/21/2018  . Polyp of corpus uteri 02/08/2018  . Near syncope 10/13/2017  . Sinusitis 07/16/2017  . Edema 04/29/2017  . 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 Dizziness 10/08/2016  . Obesity with body mass index 30 or greater 08/09/2016  . Vaginal discharge 08/09/2016  . History of adenomatous polyp of colon 08/04/2016  . Menopausal symptom 10/25/2014  . Anxiety 05/05/2014  . Adenomatous polyp of colon 05/12/2013  . Visit for preventive health examination 03/14/2013  . Pain 03/11/2013  . Vulvitis 01/28/2013     Past Medical History:  Diagnosis Date  . Chicken pox   . Colon polyps   . Concussion   . Frequent headaches   . GERD (gastroesophageal reflux disease)   . Hypertension   . Migraines   . Pre-eclampsia    with grand mal seizures  . Seizure disorder in pregnancy ([place])   . Seizures ([place])    with pregnancy 1981- pre clampsia      Past Surgical History:  Procedure Laterality Date  . COLONOSCOPY    .  DILATION AND CURETTAGE OF UTERUS  09/08/2014   with hystereoscopy  . POLYPECTOMY    . TONSILLECTOMY    . TUBAL LIGATION       Family History  Problem Relation Age of Onset  . Hypertension Mother   . Diabetes Mother   . Heart disease Father   . Kidney disease Father   .  Hypertension Father   . Diabetes Father   . Cancer Maternal Grandfather        colon cancer  . Colon cancer Maternal Grandfather        passed age 4   . Colon polyps Neg Hx   . Breast cancer Neg Hx      Social History   Substance and Sexual Activity  Drug Use No  ,  Social History   Substance and Sexual Activity  Alcohol Use Yes   Comment: very rare - glass red wine when that   ,  Social History   Tobacco Use  Smoking Status Never Smoker  Smokeless Tobacco Never Used  ,    Current Outpatient Medications on File Prior to Visit  Medication Sig Dispense Refill  . amLODipine (NORVASC) 2.5 MG tablet Take 1-2 tablets (2.5-5 mg total) by mouth daily as needed (Once daily and may take extra pill as needed for elevated blood pressures). 180 tablet 3  . Cholecalciferol (VITAMIN D-3) 1000 UNITS CAPS Take 1 capsule by mouth daily.    . fluticasone (FLONASE) 50 MCG/ACT nasal spray SHAKE LQ AND U 1 SPR IEN D  0  . MAGNESIUM MALATE PO Take 1 tablet by mouth daily.     . Multiple Vitamins-Minerals (MULTIVITAMIN WITH MINERALS) tablet Take 1 tablet by mouth daily.    . nebivolol (BYSTOLIC) 10 MG tablet Take 1 tablet (10 mg total) by mouth daily. 90 tablet 3  . Omega-3 Fatty Acids (FISH OIL) 1200 MG CAPS Take 1,200 mg by mouth daily.      No current facility-administered medications on file prior to visit.      Allergies  Allergen Reactions  . Losartan Shortness Of Breath    Dizziness,  Shortness of breath   . Anesthetics, Amide Nausea And Vomiting  . Benadryl [Diphenhydramine Hcl (Sleep)]     Made feel paralyzed when had benadryl with a cocktail for procedure  Decadron, bendayl, regaln combo at [facility] 2014- see care every where   . Benicar [Olmesartan]     Presyncopal , light headed   . Cardura [Doxazosin Mesylate] Other (See Comments)    "makes me feel terrible"  . Codeine     Other reaction(s): Unknown  . Fentanyl Hypertension    Severe htn   . Losartan Potassium-Hctz      Dizziness,  Shortness of breath   . Metformin And Related     "out of body experience"    . Prilocaine Nausea And Vomiting     Review of Systems:   General:  Denies fever, chills Optho/Auditory:   Denies visual changes, blurred vision Respiratory:   Denies SOB, cough, wheeze, DIB  Cardiovascular:   Denies chest pain, palpitations, painful respirations Gastrointestinal:   Denies nausea, vomiting, diarrhea.  Endocrine:     Denies new hot or cold intolerance Musculoskeletal:  Denies joint swelling, gait issues, or new unexplained myalgias/ arthralgias Skin:  Denies rash, suspicious lesions  Neurological:    Denies dizziness, unexplained weakness, numbness  Psychiatric/Behavioral:   Denies mood changes  Objective:    Blood pressure 132/78, pulse 61, height 5\' 4"  (  1.626 m), weight 185 lb 11.2 oz (84.2 kg), last menstrual period 12/01/2013, SpO2 100 %.  Body mass index is 31.88 kg/m.  General: Well Developed, well nourished, and in no acute distress.  HEENT: Normocephalic, atraumatic, pupils equal round reactive to light, neck supple, No carotid bruits, no JVD Skin: Warm and dry, cap RF less 2 sec Cardiac: Regular rate and rhythm, S1, S2 WNL's, no murmurs rubs or gallops Respiratory: ECTA B/L, Not using accessory muscles, speaking in full sentences. NeuroM-Sk: Ambulates w/o assistance, moves ext * 4 w/o difficulty, sensation grossly intact.  Ext: scant edema b/l lower ext Psych: No HI/SI, judgement and insight good, Euthymic mood. Full Affect.

## 2018-03-07 NOTE — Patient Instructions (Addendum)
Pt will get FBW and then a f/up OV with me one week later  Please make sure you get [place] all the blood work\medical records of all of your specialist as well.   -You see Dr. Karl Bales for your blood pressure and cardiovascular health issues of [facility].  As we talked about last time since you have had so many allergies to various medicines and they been working with you for a long time, it is best you follow-up with them regarding your blood pressure and cardiovascular health issues.      -   Also please make sure you get as Dr. Nino Glow as well as all your other specialists medical records including labs etc.  We do not have any labs on you and we recommended to come in last office visit prior to this 1 and you forgot so please prior to next office visit come in for fasting blood work a week before the visit then we will discuss it at your follow-up office visit     Please realize, EXERCISE IS MEDICINE!  -  American Heart Association [facility]) guidelines for exercise : If you are in good health, without any medical conditions, you should engage in 150 minutes of moderate intensity aerobic activity per week.  This means you should be huffing and puffing throughout your workout.   Engaging in regular exercise will improve brain function and memory, as well as improve mood, boost immune system and help with weight management.  As well as the other, more well-known effects of exercise such as decreasing blood sugar levels, decreasing blood pressure,  and decreasing bad cholesterol levels/ increasing good cholesterol levels.     -  The AHA strongly endorses consumption of a diet that contains a variety of foods from all the food categories with an emphasis on fruits and vegetables; fat-free and low-fat dairy products; cereal and grain products; legumes and nuts; and fish, poultry, and/or extra lean meats.    Excessive food intake, especially of foods high in saturated and trans fats, sugar, and salt,  should be avoided.    ---->  Adequate water intake of roughly 1/2 of your weight in pounds, should equal the ounces of water per day you should drink.  So for instance, if you're 200 pounds, that would be 100 ounces of water per day.         Mediterranean Diet  Why follow it? Research shows. . Those who follow the Mediterranean diet have a reduced risk of heart disease  . The diet is associated with a reduced incidence of Parkinson's and Alzheimer's diseases . People following the diet may have longer life expectancies and lower rates of chronic diseases  . The Dietary Guidelines for Americans recommends the Mediterranean diet as an eating plan to promote health and prevent disease  What Is the Mediterranean Diet?  . Healthy eating plan based on typical foods and recipes of Mediterranean-style cooking . The diet is primarily a plant based diet; these foods should make up a majority of meals   Starches - Plant based foods should make up a majority of meals - They are an important sources of vitamins, minerals, energy, antioxidants, and fiber - Choose whole grains, foods high in fiber and minimally processed items  - Typical grain sources include wheat, oats, barley, corn, brown rice, bulgar, farro, millet, polenta, couscous  - Various types of beans include chickpeas, lentils, fava beans, black beans, white beans   Fruits  Veggies -  Large quantities of antioxidant rich fruits & veggies; 6 or more servings  - Vegetables can be eaten raw or lightly drizzled with oil and cooked  - Vegetables common to the traditional Mediterranean Diet include: artichokes, arugula, beets, broccoli, brussel sprouts, cabbage, carrots, celery, collard greens, cucumbers, eggplant, kale, leeks, lemons, lettuce, mushrooms, okra, onions, peas, peppers, potatoes, pumpkin, radishes, rutabaga, shallots, spinach, sweet potatoes, turnips, zucchini - Fruits common to the Mediterranean Diet include: apples, apricots,  avocados, cherries, clementines, dates, figs, grapefruits, grapes, melons, nectarines, oranges, peaches, pears, pomegranates, strawberries, tangerines  Fats - Replace butter and margarine with healthy oils, such as olive oil, canola oil, and tahini  - Limit nuts to no more than a handful a day  - Nuts include walnuts, almonds, pecans, pistachios, pine nuts  - Limit or avoid candied, honey roasted or heavily salted nuts - Olives are central to the [employer] - can be eaten whole or used in a variety of dishes   Meats Protein - Limiting red meat: no more than a few times a month - When eating red meat: choose lean cuts and keep the portion to the size of deck of cards - Eggs: approx. 0 to 4 times a week  - Fish and lean poultry: at least 2 a week  - Healthy protein sources include, chicken, [place], lean beef, lamb - Increase intake of seafood such as tuna, salmon, trout, mackerel, shrimp, scallops - Avoid or limit high fat processed meats such as sausage and bacon  Dairy - Include moderate amounts of low fat dairy products  - Focus on healthy dairy such as fat free yogurt, skim milk, low or reduced fat cheese - Limit dairy products higher in fat such as whole or 2% milk, cheese, ice cream  Alcohol - Moderate amounts of red wine is ok  - No more than 5 oz daily for women (all ages) and men older than age 24  - No more than 10 oz of wine daily for men younger than 39  Other - Limit sweets and other desserts  - Use herbs and spices instead of salt to flavor foods  - Herbs and spices common to the traditional Mediterranean Diet include: basil, bay leaves, chives, cloves, cumin, fennel, garlic, lavender, marjoram, mint, oregano, parsley, pepper, rosemary, sage, savory, sumac, tarragon, thyme   It's not just a diet, it's a lifestyle:  . The Mediterranean diet includes lifestyle factors typical of those in the region  . Foods, drinks and meals are best eaten with others and savored . Daily  physical activity is important for overall good health . This could be strenuous exercise like running and aerobics . This could also be more leisurely activities such as walking, housework, yard-work, or taking the stairs . Moderation is the key; a balanced and healthy diet accommodates most foods and drinks . Consider portion sizes and frequency of consumption of certain foods   Meal Ideas & Options:  . Breakfast:  o Whole wheat toast or whole wheat English muffins with peanut butter & hard boiled egg o Steel cut oats topped with apples & cinnamon and skim milk  o Fresh fruit: banana, strawberries, melon, berries, peaches  o Smoothies: strawberries, bananas, greek yogurt, peanut butter o Low fat greek yogurt with blueberries and granola  o Egg white omelet with spinach and mushrooms o Breakfast couscous: whole wheat couscous, apricots, skim milk, cranberries  . Sandwiches:  o Hummus and grilled vegetables (peppers, zucchini, squash) on whole wheat bread  o Grilled chicken on whole wheat pita with lettuce, tomatoes, cucumbers or tzatziki  o Tuna salad on whole wheat bread: tuna salad made with greek yogurt, olives, red peppers, capers, green onions o Garlic rosemary lamb pita: lamb sauted with garlic, rosemary, salt & pepper; add lettuce, cucumber, greek yogurt to pita - flavor with lemon juice and black pepper  . Seafood:  o Mediterranean grilled salmon, seasoned with garlic, basil, parsley, lemon juice and black pepper o Shrimp, lemon, and spinach whole-grain pasta salad made with low fat greek yogurt  o Seared scallops with lemon orzo  o Seared tuna steaks seasoned salt, pepper, coriander topped with tomato mixture of olives, tomatoes, olive oil, minced garlic, parsley, green onions and cappers  . Meats:  o Herbed greek chicken salad with kalamata olives, cucumber, feta  o Red bell peppers stuffed with spinach, bulgur, lean ground beef (or lentils) & topped with feta   o Kebabs:  skewers of chicken, tomatoes, onions, zucchini, squash  o [place] burgers: made with red onions, mint, dill, lemon juice, feta cheese topped with roasted red peppers . Vegetarian o Cucumber salad: cucumbers, artichoke hearts, celery, red onion, feta cheese, tossed in olive oil & lemon juice  o Hummus and whole grain pita points with a greek salad (lettuce, tomato, feta, olives, cucumbers, red onion) o Lentil soup with celery, carrots made with vegetable broth, garlic, salt and pepper  o Tabouli salad: parsley, bulgur, mint, scallions, cucumbers, tomato, radishes, lemon juice, olive oil, salt and pepper.    Hypertension Hypertension, commonly called high blood pressure, is when the force of blood pumping through the arteries is too strong. The arteries are the blood vessels that carry blood from the heart throughout the body. Hypertension forces the heart to work harder to pump blood and may cause arteries to become narrow or stiff. Having untreated or uncontrolled hypertension can cause heart attacks, strokes, kidney disease, and other problems. A blood pressure reading consists of a higher number over a lower number. Ideally, your blood pressure should be below 120/80. The first ("top") number is called the systolic pressure. It is a measure of the pressure in your arteries as your heart beats. The second ("bottom") number is called the diastolic pressure. It is a measure of the pressure in your arteries as the heart relaxes. What are the causes? The cause of this condition is not known. What increases the risk? Some risk factors for high blood pressure are under your control. Others are not. Factors you can change  Smoking.  Having type 2 diabetes mellitus, high cholesterol, or both.  Not getting enough exercise or physical activity.  Being overweight.  Having too much fat, sugar, calories, or salt (sodium) in your diet.  Drinking too much alcohol. Factors that are difficult or  impossible to change  Having chronic kidney disease.  Having a family history of high blood pressure.  Age. Risk increases with age.  Race. You may be at higher risk if you are African-American.  Gender. Men are at higher risk than women before age 35. After age 34, women are at higher risk than men.  Having obstructive sleep apnea.  Stress. What are the signs or symptoms? Extremely high blood pressure (hypertensive crisis) may cause:  Headache.  Anxiety.  Shortness of breath.  Nosebleed.  Nausea and vomiting.  Severe chest pain.  Jerky movements you cannot control (seizures).  How is this diagnosed? This condition is diagnosed by measuring your blood pressure while you are seated,  with your arm resting on a surface. The cuff of the blood pressure monitor will be placed directly against the skin of your upper arm at the level of your heart. It should be measured at least twice using the same arm. Certain conditions can cause a difference in blood pressure between your right and left arms. Certain factors can cause blood pressure readings to be lower or higher than normal (elevated) for a short period of time:  When your blood pressure is higher when you are in a health care provider's office than when you are at home, this is called white coat hypertension. Most people with this condition do not need medicines.  When your blood pressure is higher at home than when you are in a health care provider's office, this is called masked hypertension. Most people with this condition may need medicines to control blood pressure.  If you have a high blood pressure reading during one visit or you have normal blood pressure with other risk factors:  You may be asked to return on a different day to have your blood pressure checked again.  You may be asked to monitor your blood pressure at home for 1 week or longer.  If you are diagnosed with hypertension, you may have other blood or  imaging tests to help your health care provider understand your overall risk for other conditions. How is this treated? This condition is treated by making healthy lifestyle changes, such as eating healthy foods, exercising more, and reducing your alcohol intake. Your health care provider may prescribe medicine if lifestyle changes are not enough to get your blood pressure under control, and if:  Your systolic blood pressure is above 130.  Your diastolic blood pressure is above 80.  Your personal target blood pressure may vary depending on your medical conditions, your age, and other factors. Follow these instructions at home: Eating and drinking  Eat a diet that is high in fiber and potassium, and low in sodium, added sugar, and fat. An example eating plan is called the DASH (Dietary Approaches to Stop Hypertension) diet. To eat this way: ? Eat plenty of fresh fruits and vegetables. Try to fill half of your plate at each meal with fruits and vegetables. ? Eat whole grains, such as whole wheat pasta, brown rice, or whole grain bread. Fill about one quarter of your plate with whole grains. ? Eat or drink low-fat dairy products, such as skim milk or low-fat yogurt. ? Avoid fatty cuts of meat, processed or cured meats, and poultry with skin. Fill about one quarter of your plate with lean proteins, such as fish, chicken without skin, beans, eggs, and tofu. ? Avoid premade and processed foods. These tend to be higher in sodium, added sugar, and fat.  Reduce your daily sodium intake. Most people with hypertension should eat less than 1,500 mg of sodium a day.  Limit alcohol intake to no more than 1 drink a day for nonpregnant women and 2 drinks a day for men. One drink equals 12 oz of beer, 5 oz of wine, or 1 oz of hard liquor. Lifestyle  Work with your health care provider to maintain a healthy body weight or to lose weight. Ask what an ideal weight is for you.  Get at least 30 minutes of  exercise that causes your heart to beat faster (aerobic exercise) most days of the week. Activities may include walking, swimming, or biking.  Include exercise to strengthen your muscles (resistance exercise), such  as pilates or lifting weights, as part of your weekly exercise routine. Try to do these types of exercises for 30 minutes at least 3 days a week.  Do not use any products that contain nicotine or tobacco, such as cigarettes and e-cigarettes. If you need help quitting, ask your health care provider.  Monitor your blood pressure at home as told by your health care provider.  Keep all follow-up visits as told by your health care provider. This is important. Medicines  Take over-the-counter and prescription medicines only as told by your health care provider. Follow directions carefully. Blood pressure medicines must be taken as prescribed.  Do not skip doses of blood pressure medicine. Doing this puts you at risk for problems and can make the medicine less effective.  Ask your health care provider about side effects or reactions to medicines that you should watch for. Contact a health care provider if:  You think you are having a reaction to a medicine you are taking.  You have headaches that keep coming back (recurring).  You feel dizzy.  You have swelling in your ankles.  You have trouble with your vision. Get help right away if:  You develop a severe headache or confusion.  You have unusual weakness or numbness.  You feel faint.  You have severe pain in your chest or abdomen.  You vomit repeatedly.  You have trouble breathing. Summary  Hypertension is when the force of blood pumping through your arteries is too strong. If this condition is not controlled, it may put you at risk for serious complications.  Your personal target blood pressure may vary depending on your medical conditions, your age, and other factors. For most people, a normal blood pressure is  less than 120/80.  Hypertension is treated with lifestyle changes, medicines, or a combination of both. Lifestyle changes include weight loss, eating a healthy, low-sodium diet, exercising more, and limiting alcohol. This information is not intended to replace advice given to you by your health care provider. Make sure you discuss any questions you have with your health care provider. Document Released: 06/05/2005 Document Revised: 05/03/2016 Document Reviewed: 05/03/2016 Elsevier Interactive Patient Education  [employer].

## 2018-03-08 ENCOUNTER — Encounter: Payer: Self-pay | Admitting: Family Medicine

## 2018-03-08 ENCOUNTER — Ambulatory Visit (INDEPENDENT_AMBULATORY_CARE_PROVIDER_SITE_OTHER): Payer: No Typology Code available for payment source | Admitting: Family Medicine

## 2018-03-08 VITALS — BP 132/78 | HR 61 | Ht 64.0 in | Wt 185.7 lb

## 2018-03-08 DIAGNOSIS — F39 Unspecified mood [affective] disorder: Secondary | ICD-10-CM | POA: Diagnosis not present

## 2018-03-08 DIAGNOSIS — G479 Sleep disorder, unspecified: Secondary | ICD-10-CM | POA: Diagnosis not present

## 2018-03-08 DIAGNOSIS — E669 Obesity, unspecified: Secondary | ICD-10-CM

## 2018-03-08 DIAGNOSIS — Z9111 Patient's noncompliance with dietary regimen: Secondary | ICD-10-CM

## 2018-03-08 DIAGNOSIS — Z9114 Patient's other noncompliance with medication regimen: Secondary | ICD-10-CM

## 2018-03-08 DIAGNOSIS — I10 Essential (primary) hypertension: Secondary | ICD-10-CM

## 2018-03-08 DIAGNOSIS — R7303 Prediabetes: Secondary | ICD-10-CM

## 2018-03-08 DIAGNOSIS — F604 Histrionic personality disorder: Secondary | ICD-10-CM

## 2018-03-08 DIAGNOSIS — Z91119 Patient's noncompliance with dietary regimen due to unspecified reason: Secondary | ICD-10-CM

## 2018-03-08 DIAGNOSIS — R419 Unspecified symptoms and signs involving cognitive functions and awareness: Secondary | ICD-10-CM

## 2018-03-21 ENCOUNTER — Ambulatory Visit (INDEPENDENT_AMBULATORY_CARE_PROVIDER_SITE_OTHER): Payer: No Typology Code available for payment source | Admitting: Adult Health

## 2018-03-21 ENCOUNTER — Telehealth: Payer: Self-pay | Admitting: Cardiovascular Disease

## 2018-03-21 ENCOUNTER — Encounter: Payer: Self-pay | Admitting: Adult Health

## 2018-03-21 VITALS — BP 160/78 | HR 60 | Ht 64.0 in | Wt 189.2 lb

## 2018-03-21 DIAGNOSIS — R42 Dizziness and giddiness: Secondary | ICD-10-CM

## 2018-03-21 LAB — POCT GLYCOSYLATED HEMOGLOBIN (HGB A1C): Hemoglobin A1C: 5.7 % — AB (ref 4.0–5.6)

## 2018-03-21 NOTE — Telephone Encounter (Signed)
Pt states he PCP has referred her to the BP clinic. She would like to discuss this.

## 2018-03-21 NOTE — Telephone Encounter (Signed)
Spoke with patient and she states that her blood pressures have been doing good. She states that she switched primary care providers and they want her to attend the blood pressure clinic. Checked with Izora Gala and appointment scheduled for patient to be seen on 04/08/18 with pharmacist for BP management. She was agreeable with this appointment, verbalized understanding of location, and had no further questions at this time.

## 2018-03-21 NOTE — Progress Notes (Addendum)
Subjective:    Patient ID: Nicole Gould, female    DOB: Oct 29, 1960, 57 y.o.   MRN: [id number]  HPI: 9/31/19 this past Sat, Ms. Stoermer was walking with with friend and she developed "severe shakiness and my friend went and got me a glucose tablet".  She was able to walk back her home and check her BG which was 104.   She denies diaphoresis or mental confusion during the episode. She has sig hx of episodes of dizziness, "vertigo-type situations", and "hypoglycemia moments". She has is followed by Neurology, Endocrinology, Cardiology, and she recently established with Dr. Raliegh Scarlet 01/2018. She is officially pre-diabetic last A1c 09/2017 5.9 She currently denies CP/dyspnea/dizziness/HA/N/V/D/palpitations. She estimates to drink >90 oz water/day, follows a diabetic diet and has not been exercising this week, b/c " I just feel off".  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 due to restrictions r/t hypoglycemia eating precautions  She has f/u with Endocrinologist next week  Patient Care Team    Relationship Specialty Notifications Start End  [name], DO PCP - General Family Medicine  02/08/18   Minna Merritts, MD Consulting Physician Cardiology  03/14/13   [name], MD  Internal Medicine  02/08/18   Malon Kindle, MD Referring Physician Neurology  02/08/18   Pyrtle, Lajuan Lines, MD Consulting Physician Gastroenterology  02/08/18   Jacelyn Pi, MD Consulting Physician Endocrinology  02/08/18   Allyn Kenner, DO Consulting Physician Obstetrics and Gynecology  02/08/18     Patient Active Problem List   Diagnosis Date Noted  . Mood disorder ([place]) 03/07/2018  . Polyp of corpus uteri 02/08/2018  . Hx of seizure disorder 11/10/2017  . Migraine without aura and without status migrainosus, not intractable 11/10/2017  . Prediabetes 11/10/2017  . Spells of trembling 11/10/2017  . Near syncope 10/13/2017  . Sinusitis 07/16/2017  . Edema  04/29/2017  . 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 Dizziness 10/08/2016  . Obesity with body mass index 30 or greater 08/09/2016  . Vaginal discharge 08/09/2016  . History of adenomatous polyp of colon 08/04/2016  . 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 Menopausal symptom 10/25/2014  . Primary insomnia 05/05/2014  . Anxiety 05/05/2014  . Chronic venous insufficiency 04/21/2014  . Adenomatous polyp of colon 05/12/2013  . Visit for preventive health examination 03/14/2013  . Pain 03/11/2013  . Essential hypertension, benign 01/28/2013  . Vulvitis 01/28/2013  . 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 Chicken pox   . Colon polyps   . Concussion   . Frequent headaches   . GERD (gastroesophageal reflux disease)   . Hypertension   . Migraines   . Pre-eclampsia    with grand mal seizures  . Seizure disorder in pregnancy ([place])   . Seizures ([place])    with pregnancy 1981- pre clampsia      Past Surgical History:  Procedure Laterality Date  . COLONOSCOPY    . DILATION AND CURETTAGE OF UTERUS  09/08/2014   with hystereoscopy  . POLYPECTOMY    . TONSILLECTOMY    . TUBAL LIGATION       Family History  Problem Relation Age of Onset  . Hypertension Mother   . Diabetes Mother   . Heart disease Father   . Kidney disease Father   . Hypertension Father   .  Diabetes Father   . Cancer Maternal Grandfather        colon cancer  . Colon cancer Maternal Grandfather        passed age 65   . Colon polyps Neg Hx   . Breast cancer Neg Hx      Social History   Substance and Sexual Activity  Drug Use No     Social History   Substance and Sexual Activity  Alcohol Use Yes   Comment: very rare - glass red wine when that      Social History   Tobacco Use  Smoking Status Never Smoker  Smokeless Tobacco Never Used      Outpatient Encounter Medications as of 03/21/2018  Medication Sig  . amLODipine (NORVASC) 2.5 MG tablet Take 1-2 tablets (2.5-5 mg total) by mouth daily as needed (Once daily and may take extra pill as needed for elevated blood pressures).  . Cholecalciferol (VITAMIN D-3) 1000 UNITS CAPS Take 1 capsule by mouth daily.  . fluticasone (FLONASE) 50 MCG/ACT nasal spray SHAKE LQ AND U 1 SPR IEN D  . hydrochlorothiazide (HYDRODIURIL) 25 MG tablet Take 0.5 tablets (12.5 mg total) by mouth daily as needed.  Marland Kitchen MAGNESIUM MALATE PO Take 1 tablet by mouth daily.   . Multiple Vitamins-Minerals (MULTIVITAMIN WITH MINERALS) tablet Take 1 tablet by mouth daily.  . nebivolol (BYSTOLIC) 10 MG tablet Take 1 tablet (10 mg total) by mouth daily.  . Omega-3 Fatty Acids (FISH OIL) 1200 MG CAPS Take 1,200 mg by mouth daily.   . pantoprazole (PROTONIX) 40 MG tablet Take 1 tablet (40 mg total) by mouth daily before breakfast.   No facility-administered encounter medications on file as of 03/21/2018.     Allergies: Losartan; Anesthetics, amide; Benadryl [diphenhydramine hcl (sleep)]; Benicar [olmesartan]; Cardura [doxazosin mesylate]; Codeine; Fentanyl; Losartan potassium-hctz; Metformin and related; and Prilocaine  Body mass index is 32.48 kg/m.  Blood pressure (!) 160/78, pulse 60, height 5\' 4"  (1.626 m), weight 189 lb 3.2 oz (85.8 kg), last menstrual period 12/01/2013, SpO2 100 %.  Review of Systems  Constitutional: Positive for activity change. Negative for appetite change, chills, diaphoresis, fatigue, fever and unexpected weight change.  Eyes: Negative for visual disturbance.  Respiratory: Negative for cough, chest tightness, shortness of breath, wheezing and stridor.   Cardiovascular: Negative for chest pain, palpitations and leg swelling.  Gastrointestinal: Negative for abdominal distention, abdominal pain, blood in stool, constipation, diarrhea, nausea, rectal pain and vomiting.  Endocrine: Negative  for cold intolerance, heat intolerance, polydipsia, polyphagia and polyuria.  Genitourinary: Negative for difficulty urinating and dysuria.  Musculoskeletal: Negative for arthralgias, back pain, gait problem, joint swelling, myalgias, neck pain and neck stiffness.  Neurological: Negative for dizziness and headaches.  Hematological: Does not bruise/bleed easily.  Psychiatric/Behavioral: Negative for sleep disturbance.       Objective:   Physical Exam  Constitutional: She is oriented to person, place, and time. She appears well-developed and well-nourished. No distress.  HENT:  Head: Normocephalic and atraumatic.  Right Ear: External ear normal.  Left Ear: External ear normal.  Nose: Nose normal.  Mouth/Throat: Oropharynx is clear and moist.  Eyes: Pupils are equal, round, and reactive to light. Conjunctivae and EOM are normal.  Cardiovascular: Normal rate, regular rhythm, normal heart sounds and intact distal pulses.  No murmur heard. Pulmonary/Chest: Effort normal and breath sounds normal. No stridor. No respiratory distress. She has no wheezes. She has no rales. She exhibits no tenderness.  Neurological: She is alert and oriented  to person, place, and time. She displays normal reflexes. No cranial nerve deficit. Coordination normal.  Skin: Skin is warm and dry. Capillary refill takes less than 2 seconds. No rash noted. She is not diaphoretic. No erythema. No pallor.  Psychiatric: She has a normal mood and affect. Her behavior is normal. Judgment and thought content normal.  Nursing note and vitals reviewed.     Assessment & Plan:   1. Dizziness    Please continue all medications as directed. Please carry a fast acting carbohydrate snack with you at all times. Great job on your A1c of 5.7 today! Please follow-up with your Endocrinologist next week    FOLLOW-UP:  Return if symptoms worsen or fail to improve.

## 2018-03-21 NOTE — Patient Instructions (Signed)
Hypoglycemia  Hypoglycemia occurs when the level of sugar (glucose) in the blood is too low. Glucose is a type of sugar that provides the body's main source of energy. Certain hormones (insulin and glucagon) control the level of glucose in the blood. Insulin lowers blood glucose, and glucagon increases blood glucose. Hypoglycemia can result from having too much insulin in the bloodstream, or from not eating enough food that contains glucose.  Hypoglycemia can happen in people who do or do not have diabetes. It can develop quickly, and it can be a medical emergency.  What are the causes?  Hypoglycemia occurs most often in people who have diabetes. If you have diabetes, hypoglycemia may be caused by:   Diabetes medicine.   Not eating enough, or not eating often enough.   Increased physical activity.   Drinking alcohol, especially when you have not eaten recently.    If you do not have diabetes, hypoglycemia may be caused by:   A tumor in the pancreas. The pancreas is the organ that makes insulin.   Not eating enough, or not eating for long periods at a time (fasting).   Severe infection or illness that affects the liver, heart, or kidneys.   Certain medicines.    You may also have reactive hypoglycemia. This condition causes hypoglycemia within 4 hours of eating a meal. This may occur after having stomach surgery. Sometimes, the cause of reactive hypoglycemia is not known.  What increases the risk?  Hypoglycemia is more likely to develop in:   People who have diabetes and take medicines to lower blood glucose.   People who abuse alcohol.   People who have a severe illness.    What are the signs or symptoms?  Hypoglycemia may not cause any symptoms. If you have symptoms, they may include:   Hunger.   Anxiety.   Sweating and feeling clammy.   Confusion.   Dizziness or feeling light-headed.   Sleepiness.   Nausea.   Increased heart rate.   Headache.   Blurry  vision.   Seizure.   Nightmares.   Tingling or numbness around the mouth, lips, or tongue.   A change in speech.   Decreased ability to concentrate.   A change in coordination.   Restless sleep.   Tremors or shakes.   Fainting.   Irritability.    How is this diagnosed?  Hypoglycemia is diagnosed with a blood test to measure your blood glucose level. This blood test is done while you are having symptoms. Your health care provider may also do a physical exam and review your medical history.  If you do not have diabetes, other tests may be done to find the cause of your hypoglycemia.  How is this treated?  This condition can often be treated by immediately eating or drinking something that contains glucose, such as:   3-4 sugar tablets (glucose pills).   Glucose gel, 15-gram tube.   Fruit juice, 4 oz (120 mL).   Regular soda (not diet soda), 4 oz (120 mL).   Low-fat milk, 4 oz (120 mL).   Several pieces of hard candy.   Sugar or honey, 1 Tbsp.    Treating Hypoglycemia If You Have Diabetes    If you are alert and able to swallow safely, follow the 15:15 rule:   Take 15 grams of a rapid-acting carbohydrate. Rapid-acting options include:  ? 1 tube of glucose gel.  ? 3 glucose pills.  ? 6-8 pieces of hard candy.  ?   4 oz (120 mL) of fruit juice.  ? 4 oz (120 ml) of regular (not diet) soda.   Check your blood glucose 15 minutes after you take the carbohydrate.   If the repeat blood glucose level is still at or below 70 mg/dL (3.9 mmol/L), take 15 grams of a carbohydrate again.   If your blood glucose level does not increase above 70 mg/dL (3.9 mmol/L) after 3 tries, seek emergency medical care.   After your blood glucose level returns to normal, eat a meal or a snack within 1 hour.    Treating Severe Hypoglycemia  Severe hypoglycemia is when your blood glucose level is at or below 54 mg/dL (3 mmol/L). Severe hypoglycemia is an emergency. Do not wait to see if the symptoms will go away. Get medical help  right away. Call your local emergency services (911 in the [place] Do not drive yourself to the hospital.  If you have severe hypoglycemia and you cannot eat or drink, you may need an injection of glucagon. A family member or close friend should learn how to check your blood glucose and how to give you a glucagon injection. Ask your health care provider if you need to have an emergency glucagon injection kit available.  Severe hypoglycemia may need to be treated in a hospital. The treatment may include getting glucose through an IV tube. You may also need treatment for the cause of your hypoglycemia.  Follow these instructions at home:  General instructions   Avoid any diets that cause you to not eat enough food. Talk with your health care provider before you start any new diet.   Take over-the-counter and prescription medicines only as told by your health care provider.   Limit alcohol intake to no more than 1 drink per day for nonpregnant women and 2 drinks per day for men. One drink equals 12 oz of beer, 5 oz of wine, or 1 oz of hard liquor.   Keep all follow-up visits as told by your health care provider. This is important.  If You Have Diabetes:     Make sure you know the symptoms of hypoglycemia.   Always have a rapid-acting carbohydrate snack with you to treat low blood sugar.   Follow your diabetes management plan, as told by your health care provider. Make sure you:  ? Take your medicines as directed.  ? Follow your exercise plan.  ? Follow your meal plan. Eat on time, and do not skip meals.  ? Check your blood glucose as often as directed. Make sure to check your blood glucose before and after exercise. If you exercise longer or in a different way than usual, check your blood glucose more often.  ? Follow your sick day plan whenever you cannot eat or drink normally. Make this plan in advance with your health care provider.   Share your diabetes management plan with people in your workplace, school,  and household.   Check your urine for ketones when you are ill and as told by your health care provider.   Carry a medical alert card or wear medical alert jewelry.  If You Have Reactive Hypoglycemia or Low Blood Sugar From Other Causes:   Monitor your blood glucose as told by your health care provider.   Follow instructions from your health care provider about eating or drinking restrictions.  Contact a health care provider if:   You have problems keeping your blood glucose in your target range.   You have   provider. Make sure you discuss any questions you have with your health care provider. Document Released: 06/05/2005 Document Revised: 11/17/2015 Document Reviewed: 07/09/2015 Elsevier Interactive Patient Education  [employer].  Please continue all medications as directed. Please carry a fast acting carbohydrate snack with you at all times. Great job on your A1c of 5.7 today! Please follow-up with your Endocrinologist next week.

## 2018-03-21 NOTE — Assessment & Plan Note (Addendum)
Please continue all medications as directed. Please carry a fast acting carbohydrate snack with you at all times. Lab Results  Component Value Date   HGBA1C 5.9 (A) 10/16/2017   HGBA1C 5.4 03/23/2017   HGBA1C 5.5 10/06/2016   Please follow-up with your Endocrinologist next week. BP stable Neurology examination normal

## 2018-03-31 ENCOUNTER — Other Ambulatory Visit: Payer: Self-pay | Admitting: Physician Assistant

## 2018-04-05 ENCOUNTER — Telehealth: Payer: Self-pay | Admitting: Cardiovascular Disease

## 2018-04-05 NOTE — Telephone Encounter (Signed)
I called and spoke with the patient. She is currently reporting intermittent dizziness, which had previously resolved. She has been walking daily and has lost 13-17 lbs over the last few months. She had an episode of dizziness during a walk on 9/29 where she felt so dizzy she had to sit on the ground. She was with a friend who gave her a glucose tab- when she got home her CBG was 103. Most recent HgbA1C was 5.4. She discussed this with endocrinology and they did not feel that she was hypoglycemic with that episode.  Per the patient, endocrinology advised her to touch base with cardiology to see if her drugs were contributing to her symptoms. She states she is not "stressed" either as she is currently only working 2 days/ week, but would like to work more.   She confirms she currently takes: - bystolic 10 mg once daily in the AM - amlodipine 2.5 mg once daily in the evening - she has been holding HCTZ x 1 week  Dizziness has still been intermittent- today she has been dizzy all day or "whoozy feeling."  BP (HR) readings have been: 9/11 (7:30 am)- 155/71 (67) 9/20 (12 pm)- 151/74 (59) 9/23 (7:30 am)- 135/70 (74) 9/25 (6:30 pm)- 125/67 (74) 9/30 (10:30 am)- 118/58 (67) 10/5 (7:30 am)- 122/63          (12 pm)- 169/73 (58) 10/10 (7 pm)- 159/67 10/16- 145/73 "dizzy" 10/17 (2 am) 114/60 (78)- up to urinate and felt "dizzy"  The patient is scheduled to see the HTN clinic on 04/10/18 at the [facility] office. I have advised her to: - keep her HTN clinic appt - continue to hold HCTZ as she has been doing - she has been taking her AM BP prior to walking and meds- I asked that she walk & take her bystolic as usual, then wait 30 min-1 hour and then check her BP - check her BP in the evening prior to taking her amlodipine- if SBP in the 120's or lower, then hold amlodipine to see if dizziness improves the following morning - she is aware it may be worth seeing an ENT as well as she has not seen  one before, she feels dizzy with sitting this afternoon.  The patient states "Dr. Rockey Situ said I could play with my HCTZ & amlodipine because he doesn't want me to bottom out."  The patient is agreeable with the above and aware I will forward to Dr. Rockey Situ as an Juluis Rainier. Again advised the patient to follow up in HTN next week.

## 2018-04-05 NOTE — Telephone Encounter (Signed)
Pt c/o medication issue:  1. Name of Medication: hydrochlorothiazide   2. How are you currently taking this medication (dosage and times per day)? Has stopped taking for about a week now  3. Are you having a reaction (difficulty breathing--STAT)? Dizzy, lost weight, blood sugar issues, felt like passing out   4. What is your medication issue? Patient states that blood pressure medication may be causing some health issues, although since she has stopped taking she is still having dizzy spells.  Patient has been in communication with endocrinologist about issues and she believes patient may be on too much medication since she has lost weight.  Patient wanted to make office aware and would like to know if she should change dosage of medication.  Please call to discuss

## 2018-04-06 NOTE — Telephone Encounter (Signed)
Long hx of having dizzy spells unrelated to position, pressures (which look good), or BP meds Suspect neurologic, possibly inner ear prior hx of concussion/falling out of chair/hit head, seen by neuro for h/a  No prior monitor that I can see in the computer, could consider zio patch monitor to rule out arrhythmia

## 2018-04-09 NOTE — Telephone Encounter (Signed)
Patient has appointment with hypertension clinic tomorrow.

## 2018-04-10 ENCOUNTER — Ambulatory Visit (INDEPENDENT_AMBULATORY_CARE_PROVIDER_SITE_OTHER): Payer: No Typology Code available for payment source | Admitting: Pharmacist

## 2018-04-10 VITALS — BP 152/84 | HR 60

## 2018-04-10 DIAGNOSIS — I10 Essential (primary) hypertension: Secondary | ICD-10-CM | POA: Diagnosis not present

## 2018-04-10 MED ORDER — SPIRONOLACTONE 25 MG PO TABS: 12.5000 mg | ORAL_TABLET | Freq: Every day | ORAL | 11 refills | Status: DC

## 2018-04-10 NOTE — Progress Notes (Signed)
Patient ID: Nicole Gould                 DOB: 1960-12-06                      MRN: [id number]     HPI: Nicole Gould is a 57 y.o. female referred by Dr. Rockey Gould to HTN clinic. PMH is significant for palpitations, HTN, chronic dizziness/syncope, and anxiety. She had an episode of presyncope at work on June 5, HR 148/78. HR 64, CBG 88. Orthostatics negative in the past. She was last seen in clinic in July - HCTZ was stopped due to polyuria, dry mouth, and worry over low K. Amlodipine was stopped due to worry over leg swelling. She felt that Cardura worked well but 2mg  was too strong so she was restarted on Cardura 1mg  at nighttime and continued on Bystolic 10mg  in the morning. She was advised to take HCTZ and amlodipine as needed. Dr Nicole Gould recommended urology (? Sp -neurology?) consultation for dizziness as this was felt to be unrelated to BP. Pt has seen neurology who thought dizziness was from medications and hypokalemia, thought pt may benefit from spironolactone. Most recent K in range at 3.5 and Dr Nicole Gould did not want to start spironolactone due patient's history of side effects with other medications.  Pt has had multiple BP-related concerns since her last appt in July (see separate phone notes for details). To summarize, she stopped Cardura due to balance problems and decreased energy. Symptoms improved once she stopped therapy. She was advised to stop fluid intake after 7pm due to concerns over frequent urination. She stopped taking her HCTZ 2 weeks ago and noted mild improvement in dizziness after 1 week, however still reports almost daily dizziness and balance problems despite not having low BP readings. She has seen neurology who did not find anything abnormal. She has lost 13-17 lbs over the past few months as well, so her endocrinologist was concerned that she was taking too many BP medications. Her A1c is now 5.4 and she does not take any medications for her blood sugar. Some symptoms have  resolved in scaling back medications, however not completely. She has taken Bystolic for the past year and a half and tolerated this well with no complaints.   She is working 2 days a week at [employer] - they cut back her hours due to issues with her blood pressure and feeling dizzy at work. She has not yet taken her Bystolic today, usually takes it in the morning when she gets up. Checks BP at home frequently, readings prior to taking BP medications tend to be higher. Readings lower after walking as expected.  Current HTN meds:  Bystolic 10mg  daily (AM) Amlodipine 2.5mg  daily (5PM)  Previously tried:  Doxazosin 1mg  and 2mg  - dizziness, hypotension HCTZ - polyuria, dry mouth, worried about low K Amlodipine 5mg  daily - LEE, dizzy Losartan - malaise  BP goal: <130/80mmHg  Family History: Family history includes Cancer in her maternal grandfather; Colon cancer in her maternal grandfather; Diabetes in her father and mother; Heart disease in her father; Hypertension in her father and mother; Kidney disease in her father.   Social History: The patient  reports that she has never smoked. She has never used smokeless tobacco. She reports that she drinks alcohol. She reports that she does not use drugs.   Diet: Does endorse some salt with her food, avoids caffeine.  Exercise: Walks 3-5 miles every morning. BP great  after walking - systolic 448-185U.  Home BP readings: Home bicep cuff has had for > 5 years 9/11 (7:30 am)- 155/71 (67) 9/20 (12 pm)- 151/74 (59) 9/23 (7:30 am)- 135/70 (74) 9/25 (6:30 pm)- 125/67 (74) 9/30 (10:30 am)- 118/58 (67) 10/5 (7:30 am)- 122/63          (12 pm)- 169/73 (58) 10/10 (7 pm)- 159/67 10/16- 145/73 "dizzy" 10/17 (2 am) 114/60 (78)- up to urinate and felt "dizzy" 10/23 (7:30am) 163/68            (10am) 134/76  Wt Readings from Last 3 Encounters:  03/21/18 189 lb 3.2 oz (85.8 kg)  03/08/18 185 lb 11.2 oz (84.2 kg)  02/08/18 190 lb (86.2 kg)   BP  Readings from Last 3 Encounters:  03/21/18 (!) 160/78  03/08/18 132/78  02/08/18 (!) 142/84   Pulse Readings from Last 3 Encounters:  03/21/18 60  03/08/18 61  02/08/18 65    Renal function: CrCl cannot be calculated (Patient's most recent lab result is older than the maximum 21 days allowed.).  Past Medical History:  Diagnosis Date  . Chicken pox   . Colon polyps   . Concussion   . Frequent headaches   . GERD (gastroesophageal reflux disease)   . Hypertension   . Migraines   . Pre-eclampsia    with grand mal seizures  . Seizure disorder in pregnancy ([place])   . Seizures ([place])    with pregnancy 1981- pre clampsia     Current Outpatient Medications on File Prior to Visit  Medication Sig Dispense Refill  . amLODipine (NORVASC) 2.5 MG tablet Take 1-2 tablets (2.5-5 mg total) by mouth daily as needed (Once daily and may take extra pill as needed for elevated blood pressures). 180 tablet 3  . Cholecalciferol (VITAMIN D-3) 1000 UNITS CAPS Take 1 capsule by mouth daily.    . fluticasone (FLONASE) 50 MCG/ACT nasal spray SHAKE LQ AND U 1 SPR IEN D  0  . hydrochlorothiazide (HYDRODIURIL) 25 MG tablet Take 0.5 tablets (12.5 mg total) by mouth daily as needed. 30 tablet 1  . MAGNESIUM MALATE PO Take 1 tablet by mouth daily.     . Multiple Vitamins-Minerals (MULTIVITAMIN WITH MINERALS) tablet Take 1 tablet by mouth daily.    . nebivolol (BYSTOLIC) 10 MG tablet Take 1 tablet (10 mg total) by mouth daily. 90 tablet 3  . Omega-3 Fatty Acids (FISH OIL) 1200 MG CAPS Take 1,200 mg by mouth daily.     . pantoprazole (PROTONIX) 40 MG tablet Take 1 tablet (40 mg total) by mouth daily before breakfast. 30 tablet 0   No current facility-administered medications on file prior to visit.     Allergies  Allergen Reactions  . Losartan Shortness Of Breath    Dizziness,  Shortness of breath   . Anesthetics, Amide Nausea And Vomiting  . Benadryl [Diphenhydramine Hcl (Sleep)]     Made feel paralyzed  when had benadryl with a cocktail for procedure  Decadron, bendayl, regaln combo at [facility] 2014- see care every where   . Benicar [Olmesartan]     Presyncopal , light headed   . Cardura [Doxazosin Mesylate] Other (See Comments)    "makes me feel terrible"  . Codeine     Other reaction(s): Unknown  . Fentanyl Hypertension    Severe htn   . Losartan Potassium-Hctz     Dizziness,  Shortness of breath   . Metformin And Related     "out of body experience"    .  Prilocaine Nausea And Vomiting     Assessment/Plan:  1. Hypertension - BP elevated above goal <130/33mmHg in clinic today, however pt is overdue to take her Bystolic dose and rushed to get here today. She reports continued dizziness over the past 3 weeks. Will try stopping amlodipine 2.5mg  daily to see if this improves symptoms. Also recommend referral to ENT to rule out any inner ear issues that may be contributing to vertigo since much of her dizziness occurs when her BP is normal or even high. Will continue Bystolic 10mg  daily since pt tolerates this well. Will order 24 hour ambulatory BP monitor to further assess home BP readings (pt has had home cuff for > 5 years). Advised pt that if her symptoms improve in 1-2 weeks off of the amlodipine but her BP increases, we can try low dose spironolactone 12.5mg  daily. Rx sent in to pharmacy in case pt starts, she is aware to let clinic know as we will need to recheck BMET 1-2 weeks after starting therapy.   F/u in HTN clinic after 24 hour BP monitor results are available and pt has seen ENT.  > 1 hour of time was spent in clinic with pt.   Yanett Conkright E. Donavin Audino, PharmD, BCACP, [place] [address] [place], [place] [address] Phone: [phone]; Fax: [phone] 04/10/2018 12:48 PM

## 2018-04-10 NOTE — Patient Instructions (Addendum)
It was nice to meet you today  Your blood pressure goal is < 130/95mmHg  We will set you up with a 24 hour blood pressure monitor  We will refer you to ENT so that they can see if an inner ear problem is contributing to your vertigo   Continue taking your Bystolic 10mg  daily  Check your blood pressure no sooner than 2 hours after taking your Bystolic  Stop taking your Norvasc - monitor to see if your dizziness improves  If your blood pressure starts increasing, we can try you on a low dose of spironolactone 12.5mg  once a day

## 2018-04-12 ENCOUNTER — Other Ambulatory Visit: Payer: Self-pay | Admitting: Family Medicine

## 2018-04-12 DIAGNOSIS — Z1231 Encounter for screening mammogram for malignant neoplasm of breast: Secondary | ICD-10-CM

## 2018-04-16 ENCOUNTER — Ambulatory Visit (INDEPENDENT_AMBULATORY_CARE_PROVIDER_SITE_OTHER): Payer: No Typology Code available for payment source

## 2018-04-16 DIAGNOSIS — I10 Essential (primary) hypertension: Secondary | ICD-10-CM | POA: Diagnosis not present

## 2018-04-17 ENCOUNTER — Other Ambulatory Visit: Payer: Self-pay

## 2018-04-17 ENCOUNTER — Telehealth: Payer: Self-pay | Admitting: Family Medicine

## 2018-04-17 DIAGNOSIS — I10 Essential (primary) hypertension: Secondary | ICD-10-CM

## 2018-04-17 DIAGNOSIS — Z0000 Encounter for general adult medical examination without abnormal findings: Secondary | ICD-10-CM

## 2018-04-17 DIAGNOSIS — R7303 Prediabetes: Secondary | ICD-10-CM

## 2018-04-17 NOTE — Telephone Encounter (Signed)
Patient called wanting to verify if she needed updated lab work. She has not had labs drawn at our office, we did receive some updated lab work from prior PCP (lab draw was April 2019). Can we please review if she needs to come in for labs as I do not see any pending orders.

## 2018-04-17 NOTE — Telephone Encounter (Signed)
Pt informed that she does need to come for fasting blood work.  Orders entered.  Charyl Bigger, CMA

## 2018-04-18 ENCOUNTER — Other Ambulatory Visit: Payer: Self-pay

## 2018-04-19 ENCOUNTER — Other Ambulatory Visit: Payer: No Typology Code available for payment source

## 2018-04-19 DIAGNOSIS — R7303 Prediabetes: Secondary | ICD-10-CM

## 2018-04-19 DIAGNOSIS — Z0000 Encounter for general adult medical examination without abnormal findings: Secondary | ICD-10-CM

## 2018-04-19 DIAGNOSIS — I10 Essential (primary) hypertension: Secondary | ICD-10-CM

## 2018-04-20 LAB — CBC WITH DIFFERENTIAL/PLATELET
BASOS ABS: 0 10*3/uL (ref 0.0–0.2)
Basos: 1 %
EOS (ABSOLUTE): 0 10*3/uL (ref 0.0–0.4)
Eos: 0 %
Hematocrit: 38.3 % (ref 34.0–46.6)
Hemoglobin: 12.4 g/dL (ref 11.1–15.9)
IMMATURE GRANULOCYTES: 0 %
Immature Grans (Abs): 0 10*3/uL (ref 0.0–0.1)
LYMPHS ABS: 1.2 10*3/uL (ref 0.7–3.1)
Lymphs: 44 %
MCH: 28.8 pg (ref 26.6–33.0)
MCHC: 32.4 g/dL (ref 31.5–35.7)
MCV: 89 fL (ref 79–97)
MONOS ABS: 0.1 10*3/uL (ref 0.1–0.9)
Monocytes: 5 %
NEUTROS PCT: 50 %
Neutrophils Absolute: 1.4 10*3/uL (ref 1.4–7.0)
PLATELETS: 155 10*3/uL (ref 150–450)
RBC: 4.3 x10E6/uL (ref 3.77–5.28)
RDW: 14.1 % (ref 12.3–15.4)
WBC: 2.8 10*3/uL — AB (ref 3.4–10.8)

## 2018-04-20 LAB — VITAMIN D 25 HYDROXY (VIT D DEFICIENCY, FRACTURES): Vit D, 25-Hydroxy: 36.5 ng/mL (ref 30.0–100.0)

## 2018-04-20 LAB — COMPREHENSIVE METABOLIC PANEL
ALK PHOS: 42 IU/L (ref 39–117)
ALT: 20 IU/L (ref 0–32)
AST: 22 IU/L (ref 0–40)
Albumin/Globulin Ratio: 1.4 (ref 1.2–2.2)
Albumin: 3.9 g/dL (ref 3.5–5.5)
BILIRUBIN TOTAL: 0.7 mg/dL (ref 0.0–1.2)
BUN/Creatinine Ratio: 21 (ref 9–23)
BUN: 16 mg/dL (ref 6–24)
CHLORIDE: 105 mmol/L (ref 96–106)
CO2: 23 mmol/L (ref 20–29)
Calcium: 9 mg/dL (ref 8.7–10.2)
Creatinine, Ser: 0.77 mg/dL (ref 0.57–1.00)
GFR calc Af Amer: 98 mL/min/{1.73_m2} (ref >59)
GFR calc non Af Amer: 85 mL/min/{1.73_m2} (ref >59)
GLOBULIN, TOTAL: 2.8 g/dL (ref 1.5–4.5)
Glucose: 94 mg/dL (ref 65–99)
POTASSIUM: 4.3 mmol/L (ref 3.5–5.2)
SODIUM: 143 mmol/L (ref 134–144)
Total Protein: 6.7 g/dL (ref 6.0–8.5)

## 2018-04-20 LAB — TSH: TSH: 1.71 u[IU]/mL (ref 0.450–4.500)

## 2018-04-20 LAB — LIPID PANEL
CHOLESTEROL TOTAL: 161 mg/dL (ref 100–199)
Chol/HDL Ratio: 3.1 ratio (ref 0.0–4.4)
HDL: 52 mg/dL (ref >39)
LDL CALC: 98 mg/dL (ref 0–99)
Triglycerides: 54 mg/dL (ref 0–149)
VLDL CHOLESTEROL CAL: 11 mg/dL (ref 5–40)

## 2018-04-20 LAB — T4, FREE: Free T4: 1.07 ng/dL (ref 0.82–1.77)

## 2018-04-21 DIAGNOSIS — Z91119 Patient's noncompliance with dietary regimen due to unspecified reason: Secondary | ICD-10-CM | POA: Insufficient documentation

## 2018-04-21 DIAGNOSIS — Z9111 Patient's noncompliance with dietary regimen: Secondary | ICD-10-CM | POA: Insufficient documentation

## 2018-04-21 DIAGNOSIS — Z9114 Patient's other noncompliance with medication regimen: Secondary | ICD-10-CM

## 2018-04-23 ENCOUNTER — Telehealth: Payer: Self-pay | Admitting: Pharmacist

## 2018-04-23 NOTE — Telephone Encounter (Signed)
LMOM x2 for pt to schedule f/u HTN visit since pt has completed 24 hour ambulatory BP monitor to review results, to see if pt has started spironolactone, and if she has seen ENT yet.

## 2018-04-24 ENCOUNTER — Other Ambulatory Visit: Payer: Self-pay | Admitting: Physician Assistant

## 2018-05-01 ENCOUNTER — Telehealth: Payer: Self-pay | Admitting: Cardiovascular Disease

## 2018-05-01 NOTE — Telephone Encounter (Signed)
Pt c/o medication issue:  1. Name of Medication: Spironolactone   2. How are you currently taking this medication (dosage and times per day)? Once a day half of the 25 mg   3. Are you having a reaction (difficulty breathing--STAT)? No   4. What is your medication issue?  Stomach is hurting after she takes it , would like to know if this is maybe a side effect of this She stated this pill yesterday

## 2018-05-02 NOTE — Telephone Encounter (Signed)
Agree she should just be taking spironolactone 12.5mg  once daily. This should not be causing stomach pain. Will discuss further at f/u HTN visit as well as results of 24 hour BP monitor.

## 2018-05-02 NOTE — Telephone Encounter (Signed)
Spoke with patient and she states that she is taking spironolactone 1/2 tablet twice a day. Reviewed that our records show just 1/2 tablet once daily and she should contact them to make them aware of her taking it differently. She reports that she wanted to make Dr. Rockey Situ aware of these medication changes. She reports that she has been seeing the blood pressure clinic and has upcoming appointment next Friday. She mentioned playing with her medications to get her blood pressures under control and advised that she should try to follow the direct instructions to see if that will help manage her pressures better. She verbalized understanding with no further questions at this time.

## 2018-05-02 NOTE — Telephone Encounter (Signed)
Left voicemail message to call back  

## 2018-05-05 NOTE — Telephone Encounter (Signed)
Can we send the blood pressure monitor back to [facility]  perhaps scan in the chart as well

## 2018-05-06 ENCOUNTER — Other Ambulatory Visit: Payer: Self-pay | Admitting: Family Medicine

## 2018-05-06 DIAGNOSIS — F39 Unspecified mood [affective] disorder: Secondary | ICD-10-CM

## 2018-05-09 NOTE — Progress Notes (Signed)
Patient ID: Nicole Gould                 DOB: Nov 11, 1960                      MRN: [id number]     HPI: Nicole Gould is a 57 y.o. female patient of Dr. Rockey Situ who presents today for hypertension follow up. PMH significant for palpitations, HTN, chronic dizziness/syncope, and anxiety. Orthostatics negative in the past. She had an episode of presyncope at work on June 5, BP 148/78. HR 64, CBG 88. Patient has been complaining of dizziness at all ranges of blood pressure. Medications have been adjusted and dizziness persists. At last visit, BP was elevated at 152/84 mmHg and amlodipine 2.5mg  daily was discontinued to see if dizziness would improve. Spironolactone 12.5mg  was initiated in case blood pressure increased with discontinuation of amlodipine. Holter monitor was ordered to assess home BP further. Patient was referred to ENT to rule out inner ear issues and vertigo.   Patient arrives alone and in good spirits. She took her morning medications at the start of today's visit. Patient states that the day that she was wearing the 24-hour ambulatory BP cuff, there were some issues with her family that would explain the times that her blood pressure was sustained >160/80 for 3-4 hours in the evening. Discussed results of 24-hour ambulatory cuff. Discussed that on average, waking pressures are higher than preferred, running ~150/75 mmHg, and sleeping pressures are at goal at 130/65 mmHg. Discussed that patient is a "dipper," indicating that she has normal diurnal blood pressure patterns and that she is at a relatively lower risk of cardiovascular related events.   Patient reports that she still has some dizziness and states "I know it's the medications." She is skeptical that the cause of her dizziness is due to vertigo or inner ear problems, as her dizziness issues began after she started taking blood pressure medications in the last few years. She states that before she started blood pressure  medications, the only problems that she had were with her blood sugar dropping. These days, she does check her blood sugar when she is dizzy and she is not hypoglycemic. She says that she had missed her initially scheduled appointment with the ENT, but had rescheduled to before Thanksgiving.  Patient reports checking her blood pressure at home (see log below). She sees SBP in the 130s sometimes when she checks. Normally sees SBP in 150s-170s range. Once, when she checked right before bed, her blood pressure was elevated to SBP in the 200s, which dropped to the 170s on recheck. Patient does not associate spikes in BP with stress; she says the mornings are good, but she feels that she runs higher mid morning-mid afternoon. She states that at those times, she has a headache and feels funny.   Patient states that she is under a lot of stress at work due to her manager cutting her hours (works 2 days a week) and her coworkers always watching her to see if she is doing ok. She feels that her blood pressure elevates while she is at work, but she is unable to check because her coworkers continually ask her why she is checking her pressure at work. She is worried that her hours will be further cut because she believes that her manager does not want the liability of her having another syncopal episode while she is on the clock. She feels that her work situation is further  exacerbating her issues with high blood pressure.   Patient reports that she did not stop amlodipine until the middle of November, after which she immediately switched to spironolactone 12.5mg  daily. She states that she did not think that 12.5mg  daily was enough to control her blood pressure since she was seeing blood pressures elevated in the evenings, so she has started to take 12.5mg  twice daily. Since adding the evening dose of spironolactone, patient reports that she is getting up during the night to urinate more often, but is willing to continue  taking it in the evening for adequate blood pressure control.   Patient states that she feels less dizzy on spironolactone than she felt when she was on amlodipine. While taking amlodipine, patient would get very dizzy when she would take walks. Now, on spironolactone, she is able to take walks and not feel as dizzy. However, she does report that sometimes she feels "woozy" while walking, but it is not bad enough to throw her off and make her want to stop walking.   Patient states that she felt that it took weeks for the amlodipine to fully "kick in" and affect her blood pressure. She states that amlodipine was very effective, but the dizziness was too much for her to handle. She feels that it may also take a little while for the spironolactone to take its full effect. She states that she would be willing to increase the dose of spironolactone, since it is not making her feel as bad as other medications have in the past. Patient reports that she has tried lisinopril in the past and it caused her to cough.   Patient brought her blood pressure cuff to the visit to compare home cuff readings to manual check. Home cuff: 1. 165/83, 2. 163/75. Office: 1. 162/80, 2. 174/92.    Patient also reports anxiety and mood changes. She states that her problems at work will set her off and she will get angry easily if someone says something that bothers her. She feels that if she gets her anxiety/anger under control, her blood pressure will also be better controlled. She asks about addition of CBD oil for anxiety and its effect on her current medications. Ran an interaction check via [employer] and there were no notable interactions found. Advised patient to be an informed consumer when trying these supplements if she wishes, as the data regarding the effectiveness is lacking. Suggested that she should talk with her PCP about other ways to manage anxiety, such as SSRIs or Buspar. Patient has tried fluoxetine in the  past for anxiety and it made her feel poorly.   Current HTN meds:  Bystolic 10mg  daily (AM) Spironolactone 12.5mg  twice daily   Previously tried:  Diltiazem 1mg  and 2mg  - dizziness, hypotnsion HCTZ - polyuria, dry mouth, worried about low K Amlodipine 5mg  daily - LEE, dizzy Losartan - malaise Lisinopril - cough  BP goal: <130/80 mmHg  Family History: Family history includes Cancer in her maternal grandfather; Colon cancer in her maternal grandfather; Diabetes in her father and mother; Heart disease in her father; Hypertension in her father and mother; Kidney disease in her father.  Social History: The patientreports that she has never smoked. She has never used smokeless tobacco. She reports that she drinks alcohol. She reports that she does not use drugs.  Diet: Does endorse some salt with her food, avoids caffeine.  Exercise: Walks 3-5 miles every morning. BP great after walking - systolic [id number].  Home BP readings:  10/23 7:30 am - 163/68 - 61  10:00 pm - 134/76 - 83 10/24 10:00 am - 133/72 - 74 -- after walking   10:00 pm - 149/74 - 61 04/12/18 8:00 am - 117/67 - 68 -- before walking, felt dizzy  8:30 am - 153/75 - 63  4:45 pm - 147/69 - 68 -- before meds (5PM) 04/13/18 7:45 am - 140/63 - 61 -- before meds 04/14/18 8:30 pm - 148/73 - 64 04/15/18 6:45 am - 137/67 - 72 04/16/18 6:45 am - 137/75 - 61 04/17/18 6:50 am - 105/65 - 65  2:30 pm - 147/68 - 66 04/18/18 5:00 pm - 132/64 - 70 04/19/18 7:45 am - 139/67 - 67  12:23 pm - 141/62 - 62 04/21/18 7:30 am - 154/72 - 63 04/22/18 6:45 am - 146/64 - 69 04/30/18 7:15 am - 142/65 - 63  10:30 pm - 154/68 - 56 -- stopped amlodipine, started spironolactone 05/01/18 11:00 am - 140/69 - 64 05/04/18 7:30 am - 115/66 - 65 05/05/18 7:30 pm - 177/75 - 60  Before bed - 202/76   Right after - 177/75 05/06/18 7:30 am - 111/57 - 67 05/07/18 7:00 am - 148/71 - 66  10:30 pm - 155/65 - 58 05/08/18 5:30 pm - 159/75 - 67 05/09/18 10:00  am - 149/71 - 67 05/10/18 7:30 am - 171/76 - 59   Wt Readings from Last 3 Encounters:  03/21/18 189 lb 3.2 oz (85.8 kg)  03/08/18 185 lb 11.2 oz (84.2 kg)  02/08/18 190 lb (86.2 kg)   BP Readings from Last 3 Encounters:  05/10/18 (!) 174/92  04/10/18 (!) 152/84  03/21/18 (!) 160/78   Pulse Readings from Last 3 Encounters:  05/10/18 (!) 55  04/10/18 60  03/21/18 60    Renal function: CrCl cannot be calculated (Patient's most recent lab result is older than the maximum 21 days allowed.).  Past Medical History:  Diagnosis Date  . Chicken pox   . Colon polyps   . Concussion   . Frequent headaches   . GERD (gastroesophageal reflux disease)   . Hypertension   . Migraines   . Pre-eclampsia    with grand mal seizures  . Seizure disorder in pregnancy ([place])   . Seizures ([place])    with pregnancy 1981- pre clampsia     Current Outpatient Medications on File Prior to Visit  Medication Sig Dispense Refill  . amLODipine (NORVASC) 2.5 MG tablet Take 1-2 tablets (2.5-5 mg total) by mouth daily as needed (Once daily and may take extra pill as needed for elevated blood pressures). 180 tablet 3  . Cholecalciferol (VITAMIN D-3) 1000 UNITS CAPS Take 1 capsule by mouth daily.    . fluticasone (FLONASE) 50 MCG/ACT nasal spray SHAKE LQ AND U 1 SPR IEN D  0  . MAGNESIUM MALATE PO Take 1 tablet by mouth daily.     . Multiple Vitamins-Minerals (MULTIVITAMIN WITH MINERALS) tablet Take 1 tablet by mouth daily.    . nebivolol (BYSTOLIC) 10 MG tablet Take 1 tablet (10 mg total) by mouth daily. 90 tablet 3  . Omega-3 Fatty Acids (FISH OIL) 1200 MG CAPS Take 1,200 mg by mouth daily.     . pantoprazole (PROTONIX) 40 MG tablet TAKE 1 TABLET (40 MG TOTAL) BY MOUTH DAILY BEFORE BREAKFAST. 30 tablet 0   No current facility-administered medications on file prior to visit.     Allergies  Allergen Reactions  . Losartan Shortness Of Breath    Dizziness,  Shortness of breath   . Anesthetics, Amide Nausea  And Vomiting  . Benadryl [Diphenhydramine Hcl (Sleep)]     Made feel paralyzed when had benadryl with a cocktail for procedure  Decadron, bendayl, regaln combo at [facility] 2014- see care every where   . Benicar [Olmesartan]     Presyncopal , light headed   . Cardura [Doxazosin Mesylate] Other (See Comments)    "makes me feel terrible"  . Codeine     Other reaction(s): Unknown  . Fentanyl Hypertension    Severe htn   . Losartan Potassium-Hctz     Dizziness,  Shortness of breath   . Metformin And Related     "out of body experience"    . Prilocaine Nausea And Vomiting    Blood pressure (!) 174/92, pulse (!) 55, last menstrual period 12/01/2013, SpO2 99 %.   Assessment/Plan:  1. Hypertension: Blood pressure above goal of <130/80 mmHg per home log, 24-hour ambulatory blood pressure results, and in office readings. Blood pressure results from today are without effect of medications, as patient had taken them at the start of the visit. However, given how elevated pressure was today, and based on patient's home blood pressure log and the 24-hour ambulatory meter results, unlikely that she would have been at goal even if she had taken her medications >2 hours before office check. Will increase spironolactone to 25mg  in the mornings and 12.5mg  in the evenings. Continue Bystolic10mg  daily. BMET today to check renal function and electrolytes with initiation of spironolactone at last visit. Counseled patient to continue checking blood pressure at home and to keep a log. Advised patient that if she does see elevations in pressure, to give herself some time to sit and relax and do a recheck.   Spent > 1 hour with patient today discussing blood pressure and anxiety and methods to minimize them  Cleotis Lema, PharmD Student 05/10/18 1:14 PM  Megan E. Supple, PharmD, BCACP, [place] [address] [place], [place] [address] Phone: [phone]; Fax: [phone] 05/10/2018 1:55 PM

## 2018-05-10 ENCOUNTER — Ambulatory Visit (INDEPENDENT_AMBULATORY_CARE_PROVIDER_SITE_OTHER): Payer: No Typology Code available for payment source | Admitting: Pharmacist

## 2018-05-10 VITALS — BP 174/92 | HR 55

## 2018-05-10 DIAGNOSIS — I10 Essential (primary) hypertension: Secondary | ICD-10-CM | POA: Diagnosis not present

## 2018-05-10 LAB — BASIC METABOLIC PANEL
BUN / CREAT RATIO: 22 (ref 9–23)
BUN: 16 mg/dL (ref 6–24)
CALCIUM: 9.5 mg/dL (ref 8.7–10.2)
CHLORIDE: 104 mmol/L (ref 96–106)
CO2: 22 mmol/L (ref 20–29)
CREATININE: 0.74 mg/dL (ref 0.57–1.00)
GFR calc non Af Amer: 89 mL/min/{1.73_m2} (ref >59)
GFR, EST AFRICAN AMERICAN: 103 mL/min/{1.73_m2} (ref >59)
Glucose: 84 mg/dL (ref 65–99)
Potassium: 4.2 mmol/L (ref 3.5–5.2)
Sodium: 141 mmol/L (ref 134–144)

## 2018-05-10 MED ORDER — SPIRONOLACTONE 25 MG PO TABS: ORAL_TABLET | ORAL | 6 refills | Status: DC

## 2018-05-10 NOTE — Patient Instructions (Signed)
Thank you for coming to see [place] today!  CONTINUE taking Bystolic 10mg  daily in the mornings  INCREASE spironolactone to 1 tablet (25mg ) in the mornings and 1/2 tablet (12.5mg ) in the evenings  Continue checking your blood pressure at home and keep a log.   Follow up with [place] in ~1 month.

## 2018-05-14 ENCOUNTER — Telehealth: Payer: Self-pay | Admitting: Pharmacist

## 2018-05-14 NOTE — Telephone Encounter (Signed)
Pt called in to report that her blood pressure yesterday was 202/119 at work yesterday and 152/88 once home. She states that she is very concerned about these high pressures and she "wants every work up known to man" to determine why she is having high pressures. She wants renal catecholamines measured as her husband has had this previously. She believes that there is something causing her pressures to increase.   Discussed that work is a stressful environment to her, but that yesterday was not overly stressful. She states they did have thanskgiving dinner yesterday and she admits to eating foods high in sodium.   Around 12pm she states her BP was 164/96 and other arm read 129/69. She states that normally there is not this variant in her pressures. She wants to know why this occurred.   She states that her PCP gave her HCTZ but she has been using this PRN per her report and takes this most days. After some chatting with her she reports that this is actually not the case. She has never taking this with spironolactone - it remains somewhat unclear how exactly or if exactly she is/was taking this. She would like to take this PRN through the holiday weekend for high pressures.   After the above discussion she reports she is taking:  Spironolactone 37.5mg  daily  Bystolic 10mg  daily  She does believe that she is better off the HCTZ, but that it did help her. She thinks it did not control her pressures, but helped. She feels that the dose increase of spironolactone did not help pressures as much. Discussed that can be 2 weeks or so before see benefit. Advised that she monitor today and tomorrow and call if pressures are again above 200 and we will consider a PRN to help with pressures as she is extremely anxious about walking around with elevated pressures. She will call tomorrow afternoon should she have any pressures over 200. I have also moved her blood pressure visit to sooner.

## 2018-05-15 ENCOUNTER — Telehealth: Payer: Self-pay | Admitting: Pharmacist

## 2018-05-15 NOTE — Telephone Encounter (Signed)
Pt called to report BP this AM 144/68, 139/60 in the afternoon. She reported some dizziness today despite BP readings not dropping low. She reiterates her concern regarding her elevated BP yesterday. She is also seeing ENT today to r/u alternative causes of dizziness. Reassured pt and advised her to continue with Bystolic 10mg  daily and spironolactone 25mg  AM and 12.5mg  PM and to keep f/u visit in HTN clinic.

## 2018-05-20 ENCOUNTER — Telehealth: Payer: Self-pay | Admitting: Pharmacist

## 2018-05-20 NOTE — Telephone Encounter (Signed)
Pt states that she has been having elevated pressures 160-170/80-90s over the weekend. 167/82 this am and 162/91 mid-day.   She took 50mg  of spironolactone this morning as Jinny Blossom previously advised could increase dosage if needed. She has been drinking a lot of water to counteract her increase sodium with thanksgiving dinner.  Discussed restarting HCTZ in addition (she feels her pressures did better on this in the past), but she would like to try increased dosage of spironolactone first as we discussed that it may take several days to see benefit of higher dose. Will repeat BMET at follow up visit in HTN clinic in 10 days.

## 2018-05-21 ENCOUNTER — Telehealth: Payer: Self-pay | Admitting: Cardiovascular Disease

## 2018-05-21 NOTE — Telephone Encounter (Signed)
Pt would like to discuss her BP. States she is on Spironolactone which the BP clinic on [address] [employer] put her on. She thinks this medication is not helping her. States she is still feeling dizzy. Pt would also like to address getting an appointment with Dr. Rockey Situ.

## 2018-05-22 ENCOUNTER — Ambulatory Visit
Admission: RE | Admit: 2018-05-22 | Discharge: 2018-05-22 | Disposition: A | Discharge status: Home / Self Care | Payer: No Typology Code available for payment source | Source: Ambulatory Visit | Attending: Family Medicine | Admitting: Family Medicine

## 2018-05-22 DIAGNOSIS — Z1231 Encounter for screening mammogram for malignant neoplasm of breast: Secondary | ICD-10-CM

## 2018-05-22 NOTE — Telephone Encounter (Signed)
Spoke with patient and reviewed her concerns. Advised that she should continue medications as directed and that it can take some time for them to find the right regimen for her. She verbalized understanding and states that she also wanted to make Dr. Rockey Situ aware of her medication changes. Advised that he is aware and to please continue her BP appointments. She verbalized understanding with no further questions at this time.

## 2018-05-28 NOTE — Telephone Encounter (Signed)
It would seem there is a long history of dizziness, likely unrelated to her medications

## 2018-05-29 ENCOUNTER — Other Ambulatory Visit: Payer: Self-pay | Admitting: Physician Assistant

## 2018-05-29 NOTE — Telephone Encounter (Signed)
[facility] calling stating pt is requesting a refill on medication pantoprazole before she goes out of town tomorrow. Patient last seen 9.21.18.

## 2018-05-30 ENCOUNTER — Telehealth: Payer: Self-pay | Admitting: Pharmacist

## 2018-05-30 DIAGNOSIS — I10 Essential (primary) hypertension: Secondary | ICD-10-CM

## 2018-05-30 NOTE — Telephone Encounter (Signed)
° °  Patient requesting call from Pharmacist   1. Name of Medication: spironolactone (ALDACTONE) 50 MG tablet  2. How are you currently taking this medication (dosage and times per day)? As written  3. Are you having a reaction (difficulty breathing--STAT)? no  4. What is your medication issue? Patient requesting call from pharm to discuss

## 2018-05-31 ENCOUNTER — Ambulatory Visit: Payer: Self-pay

## 2018-05-31 NOTE — Telephone Encounter (Signed)
Patient calling States that she has an appointment on 12/18 with C. Sharolyn Douglas Would like to see Dr. Rockey Situ if possible instead Please call to discuss

## 2018-05-31 NOTE — Telephone Encounter (Signed)
Spoke with patient and confirmed her upcoming appointment with our APP provider. She states that her SBP is ranging 150-170's on the current medications. She said that she now has some dizziness mainly in the morning and thinks it is due to the spironolactone. Recommended that she discuss these concerns at her upcoming appointment and to please bring in her log of blood pressure readings. She verbalized understanding with no further questions. She reports that she is currently out of town and has not checked it today. Confirmed her appointment information and was agreeable with no further questions.

## 2018-06-01 MED ORDER — HYDROCHLOROTHIAZIDE 12.5 MG PO CAPS: 12.5000 mg | ORAL_CAPSULE | Freq: Every day | ORAL | 3 refills | Status: DC

## 2018-06-01 NOTE — Telephone Encounter (Signed)
Spoke with patient and she reports that her blood pressure is still running high on the spironolactone. She states that her pressures occasionally run 120s, but mostly 150s.   She states that she is starting to feel better on the spironolactone, but was struggling with dizziness. She also reports some heaviness in her head. She did not have her cuff with her to check her pressure now. Discussed that this could be her pressures running high. She will check and call to report. She also asks about breast tenderness and enlargement. She states her sister had to stop spironolactone due to this. She denies any tenderness, but states that she may have some enlargement. She would like to remain on spironolactone at this time.   Discussed options for blood pressure and she would like to resume HCTZ 12.5mg  daily as she believes this did well before and she still has some at home. Will need to check BMET at visit with Ignacia Bayley on Wednesday with increased dose of spironolactone and restart of HCTZ.   She can be scheduled in HTN Clinic for follow up if needed.

## 2018-06-04 ENCOUNTER — Telehealth: Payer: Self-pay | Admitting: Family Medicine

## 2018-06-04 NOTE — Telephone Encounter (Signed)
Patient contacted our office stating she wants a new referral for a cardiologist within [place], she feels that her treatment with [facility] is not getting to the root cause of the issues she is having and that her BP is still not under control where it should be.  Also, she is having a lot of stress/nerves with work and has talked to her counselor who recommended her getting on Prozac to help (the patient and counselor both think that a lot of the BP issues is stressed induced). Patient states that she cannot take Prozac but between doing her own research and talking to other she is wanting to see about a low dose of Venlafaxine to help with this ongoing stress.  Patient has an appt for these issues on 12/19 but wanted to send this message to get things rolling sooner if possible.

## 2018-06-05 ENCOUNTER — Encounter: Payer: Self-pay | Admitting: Nurse Practitioner

## 2018-06-05 ENCOUNTER — Ambulatory Visit (INDEPENDENT_AMBULATORY_CARE_PROVIDER_SITE_OTHER): Payer: No Typology Code available for payment source | Admitting: Nurse Practitioner

## 2018-06-05 VITALS — BP 143/91 | HR 57 | Ht 64.0 in | Wt 186.5 lb

## 2018-06-05 DIAGNOSIS — I10 Essential (primary) hypertension: Secondary | ICD-10-CM | POA: Diagnosis not present

## 2018-06-05 DIAGNOSIS — Z79899 Other long term (current) drug therapy: Secondary | ICD-10-CM

## 2018-06-05 NOTE — Patient Instructions (Signed)
Medication Instructions:  - Your physician has recommended you make the following change in your medication:   1) DECREASE spironolactone 50 mg- take 1/2 tablet (25 mg) by mouth once daily  2) START hydrochlorothiazide (HCTZ) 12.5 mg- take 1 tablet by mouth once daily   If you need a refill on your cardiac medications before your next appointment, please call your pharmacy.   Lab work: - Your physician recommends that you have lab work today: [employer]  - Your physician recommends that you return for lab work in: 1 week- BMP   If you have labs (blood work) drawn today and your tests are completely normal, you will receive your results only by: Marland Kitchen MyChart Message (if you have MyChart) OR . A paper copy in the mail If you have any lab test that is abnormal or we need to change your treatment, we will call you to review the results.  Testing/Procedures: - none ordered  Follow-Up: At [facility], you and your health needs are our priority.  As part of our continuing mission to provide you with exceptional heart care, we have created designated Provider Care Teams.  These Care Teams include your primary Cardiologist (physician) and Advanced Practice Providers (APPs -  Physician Assistants and Nurse Practitioners) who all work together to provide you with the care you need, when you need it. . in 2 weeks in the hypertension clinic in [place]- I sent a message to their clinic to call and schedule an appointment for you  Any Other Special Instructions Will Be Listed Below (If Applicable). - N/A

## 2018-06-05 NOTE — Progress Notes (Signed)
Office Visit    Patient Name: Nicole Gould Date of Encounter: 06/05/2018  Primary Care Provider:  [name], DO Primary Cardiologist:  Ida Rogue, MD  Chief Complaint    57 year old female with a history of difficult to control hypertension secondary to multiple medication intolerances, chronic dizziness, headaches, GERD, palpitations, and anxiety, who presents for follow-up related to hypertension.  Past Medical History    Past Medical History:  Diagnosis Date  . Chicken pox   . Chronic Dizziness   . Colon polyps   . Concussion   . Frequent headaches   . GERD (gastroesophageal reflux disease)   . History of stress test    a. 2005 - in setting of ectopy - reportedly nl.  . Hypertension    a. 03/2018 24hr ABM: Mean BP 146/75, mean HR 61; Mean awake BP/HR 150/78, 62; Mean sleep BP/HR 130/65, 58.  . Migraines   . Pre-eclampsia    with grand mal seizures  . Seizure disorder in pregnancy ([place])   . Seizures ([place])    with pregnancy 1981- pre clampsia    Past Surgical History:  Procedure Laterality Date  . COLONOSCOPY    . DILATION AND CURETTAGE OF UTERUS  09/08/2014   with hystereoscopy  . POLYPECTOMY    . TONSILLECTOMY    . TUBAL LIGATION      Allergies  Allergies  Allergen Reactions  . Losartan Shortness Of Breath    Dizziness,  Shortness of breath   . Anesthetics, Amide Nausea And Vomiting  . Benadryl [Diphenhydramine Hcl (Sleep)]     Made feel paralyzed when had benadryl with a cocktail for procedure  Decadron, bendayl, regaln combo at [facility] 2014- see care every where   . Benicar [Olmesartan]     Presyncopal , light headed   . Cardura [Doxazosin Mesylate] Other (See Comments)    "makes me feel terrible"  . Codeine     Other reaction(s): Unknown  . Fentanyl Hypertension    Severe htn   . Losartan Potassium-Hctz     Dizziness,  Shortness of breath   . Metformin And Related     "out of body experience"    . Prilocaine Nausea And  Vomiting    History of Present Illness    57 year old female with the above complex past medical history including hypertension, multiple medication intolerances, chronic dizziness, headaches, GERD, palpitations, and anxiety.  She was last seen by Dr. Rockey Situ in July and subsequently underwent ambulatory blood pressure monitoring in October, which showed an average blood pressure of 146/75 with an average heart rate of 61.  Average blood pressure during waking hours was 150/78.  She has well-documented history of intolerance to multiple medications or sometimes just particular doses of medications including:  Cardura 2 mg-caused dizziness Amlodipine 5 mg-caused lower extremity swelling; headache HCTZ-hypokalemia and dry mouth Losartan-dizziness and shortness of breath; malaise Benicar-lightheadedness and presyncope Spironolactone-50 mg cause breast fullness  She has most recently been followed in our pharmacists run hypertension clinic in [place].  She has been tolerating nebivolol well and was placed on Spironolactone therapy earlier this fall.  This was subsequently titrated to 50 mg daily.  Patient says that since the titration to 50 mg, she has had some lightheadedness and has also noted fullness and tenderness in her breasts.  She would like to go back down to 25 mg.  Most recently, she spoke with 1 of our pharmacists in [place] to resume HCTZ 12.5 mg daily.  This  was sent into her pharmacy however, she was out of town and has yet to pick that up.  Her blood pressures at home run in a similar fashion to what was seen on the ambulatory blood pressure monitor.  Frequently in the 140s to 150s with occasional systolic pressures in the 170s.  She has been trying her best to manage her blood pressure through nonpharmacologic means.  She is lost 17 pounds this year and is careful with salt intake.  She does eat out every Sunday but says otherwise she thinks she does pretty good.  She walks up  to 4 miles at least 3 days a week.  Though she has intermittent lightheadedness, she denies chest pain, palpitations, dyspnea, PND, orthopnea, syncope, edema, or early satiety.  She does think stress and anxiety are playing a role in her hypertension.  Home Medications    Prior to Admission medications   Medication Sig Start Date End Date Taking? Authorizing Provider  Cholecalciferol (VITAMIN D-3) 1000 UNITS CAPS Take 1 capsule by mouth daily.    [provider]  fluticasone (FLONASE) 50 MCG/ACT nasal spray SHAKE LQ AND U 1 SPR IEN D 07/12/17   [provider]  hydrochlorothiazide (MICROZIDE) 12.5 MG capsule Take 1 capsule (12.5 mg total) by mouth daily. 06/01/18 08/30/18  Minna Merritts, MD  MAGNESIUM MALATE PO Take 1 tablet by mouth daily.     [provider]  Multiple Vitamins-Minerals (MULTIVITAMIN WITH MINERALS) tablet Take 1 tablet by mouth daily.    [provider]  nebivolol (BYSTOLIC) 10 MG tablet Take 1 tablet (10 mg total) by mouth daily. 10/15/17   Minna Merritts, MD  Omega-3 Fatty Acids (FISH OIL) 1200 MG CAPS Take 1,200 mg by mouth daily.     [provider]  pantoprazole (PROTONIX) 40 MG tablet TAKE 1 TABLET (40 MG TOTAL) BY MOUTH DAILY BEFORE BREAKFAST. 05/29/18   Levin Erp, PA  spironolactone (ALDACTONE) 50 MG tablet Take 50mg  by mouth daily 05/20/18   Minna Merritts, MD    Review of Systems    Breast fullness and lightheadedness on 50 mg of spironolactone.  She reports a lot of stress and anxiety in her life right now and plans to ask her primary care provider for something to manage this as she thinks it is contributing to her hypertension.  She denies chest pain, palpitations, dyspnea, PND, orthopnea, syncope, edema, or early satiety.  All other systems reviewed and are otherwise negative except as noted above.  Physical Exam    VS:  BP (!) 143/91 (BP Location: Left Arm, Patient Position: Sitting, Cuff Size:  Normal)   Pulse (!) 57   Ht 5\' 4"  (1.626 m)   Wt 186 lb 8 oz (84.6 kg)   LMP 12/01/2013 (Approximate)   BMI 32.01 kg/m  , BMI Body mass index is 32.01 kg/m. GEN: Well nourished, well developed, in no acute distress. HEENT: normal. Neck: Supple, no JVD, carotid bruits, or masses. Cardiac: RRR, no murmurs, rubs, or gallops. No clubbing, cyanosis, edema.  Radials/DP/PT 2+ and equal bilaterally.  Respiratory:  Respirations regular and unlabored, clear to auscultation bilaterally. GI: Soft, nontender, nondistended, BS + x 4. MS: no deformity or atrophy. Skin: warm and dry, no rash. Neuro:  Strength and sensation are intact. Psych: Normal affect.  Accessory Clinical Findings    ECG personally reviewed by me today -sinus bradycardia, 57, left atrial enlargement, septal infarct- no acute changes.  Assessment & Plan  1.  Essential hypertension: Blood pressure moderately elevated at 143/91 today.  Prior ambulatory blood pressure monitoring showed a mean blood pressure of 146/75 and records that she keeps and presents with today corroborate this.  She has tolerated nebivolol well and was tolerating spironolactone but since being moved to 50 mg, she has been experiencing breast fullness and some lightheadedness.  Most recently, she asked to go back on HCTZ but has not yet started this.  She will pick up HCTZ 12.5 mg today and plan to take this daily.  Given issues with spironolactone, we will go back down to 25 mg daily.  I will follow-up a basic metabolic panel today as she has yet to have 1 on the 50 mg spironolactone dose and as she is adding back HCTZ, I will plan for a follow-up basic metabolic panel in 1 week as well.  Ms. Thielke believes that stress and anxiety are playing a large role in her elevated blood pressures in we will seek therapy for this through primary care today.  As she has multiple intolerances to medications and in some cases just higher doses of medications, we may need to  look to low-dose hydralazine as a next potential option.  I suspect a clonidine patch would work well for her blood pressure but with baseline sinus bradycardia and a history of dry mouth, we should avoid.  2.  Disposition: Follow-up basic metabolic panel today and in 1 week.  Patient will follow-up for blood pressure management in 2 to 4 weeks.  She will continue to check her pressures at home.  Murray Hodgkins, NP 06/05/2018, 1:15 PM

## 2018-06-06 ENCOUNTER — Encounter: Payer: Self-pay | Admitting: Family Medicine

## 2018-06-06 ENCOUNTER — Ambulatory Visit: Payer: Self-pay

## 2018-06-06 ENCOUNTER — Ambulatory Visit (INDEPENDENT_AMBULATORY_CARE_PROVIDER_SITE_OTHER): Payer: No Typology Code available for payment source | Admitting: Family Medicine

## 2018-06-06 VITALS — BP 134/78 | HR 66 | Ht 64.0 in | Wt 184.0 lb

## 2018-06-06 DIAGNOSIS — F39 Unspecified mood [affective] disorder: Secondary | ICD-10-CM | POA: Diagnosis not present

## 2018-06-06 DIAGNOSIS — F5105 Insomnia due to other mental disorder: Secondary | ICD-10-CM | POA: Diagnosis not present

## 2018-06-06 DIAGNOSIS — I10 Essential (primary) hypertension: Secondary | ICD-10-CM | POA: Diagnosis not present

## 2018-06-06 DIAGNOSIS — F409 Phobic anxiety disorder, unspecified: Secondary | ICD-10-CM

## 2018-06-06 LAB — BASIC METABOLIC PANEL
BUN/Creatinine Ratio: 31 — ABNORMAL HIGH (ref 9–23)
BUN: 21 mg/dL (ref 6–24)
CO2: 21 mmol/L (ref 20–29)
CREATININE: 0.68 mg/dL (ref 0.57–1.00)
Calcium: 9.6 mg/dL (ref 8.7–10.2)
Chloride: 106 mmol/L (ref 96–106)
GFR calc Af Amer: 111 mL/min/{1.73_m2} (ref >59)
GFR, EST NON AFRICAN AMERICAN: 96 mL/min/{1.73_m2} (ref >59)
Glucose: 97 mg/dL (ref 65–99)
Potassium: 4 mmol/L (ref 3.5–5.2)
Sodium: 139 mmol/L (ref 134–144)

## 2018-06-06 MED ORDER — SERTRALINE HCL 25 MG PO TABS: ORAL_TABLET | ORAL | 0 refills | Status: DC

## 2018-06-06 MED ORDER — TRAZODONE HCL 50 MG PO TABS: 25.0000 mg | ORAL_TABLET | Freq: Every evening | ORAL | 0 refills | Status: DC | PRN

## 2018-06-06 NOTE — Progress Notes (Signed)
Impression and Recommendations:    1. Mood disorder ([place])   2. Essential hypertension, benign   3. Insomnia due to stress, anxiety and fear     1. Mood disorder - Sertraline discussed and recommended today. - Begin Zoloft today. - Reviewed starting at a very low dose and increasing every 2-3 weeks with tolerance. - Reviewed that it will take a full 8+ weeks before completely taking effect.  - Encouraged patient to continue to visit her counselor for further assistance-she goes weekly now and maybe will go to every 2 weeks.  This was strongly encouraged for her to continue.  - Reviewed the "spokes of the wheel" of mood and health management.  Stressed the importance of ongoing prudent habits, including regular exercise, appropriate sleep hygiene, healthful dietary habits, and prayer/meditation to relax.  - Encouraged patient to increase her workout intensity.  - Per patient, she is exercising, eating better, visiting the counselor.  - Return in 6-8 weeks for follow-up-to see how she is doing on the new sleep and mood medicine.  - Patient knows to call us with questions or concerns.  2. Insomnia - Trazodone recommended, discussed, and trazodone provided today.  See med list today.  - If her sleep does not improve after several weeks of taking the Zoloft, patient will add in the trazodone to her nightly routine for assistance with sleep.  Patient knows to take the trazodone around 9 PM.  - Will continue to monitor.  3. Essential hypertension, benign - Still not optimally controlled; managed by cardiology. - Patient will continue to follow up with cardiology for hypertension management. - Will continue to monitor.  4. BMI Counseling - BMI of 31.58, Obesity Explained to patient what BMI refers to, and what it means medically.    Told patient to think about it as a "medical risk stratification measurement" and how increasing BMI is associated with increasing risk/ or  worsening state of various diseases such as hypertension, hyperlipidemia, diabetes, premature OA, depression etc.  American Heart Association guidelines for healthy diet, basically Mediterranean diet, and exercise guidelines of 30 minutes 5 days per week or more discussed in detail.  Health counseling performed.  All questions answered.  5. Lifestyle & Preventative Health Maintenance - Advised patient to continue working toward exercising to improve overall mental, physical, and emotional health.    - Encouraged patient to engage in daily physical activity, especially a formal exercise routine.  Recommended that the patient eventually strive for at least 150 minutes of moderate cardiovascular activity per week according to guidelines established by the [facility].   - Healthy dietary habits encouraged, including low-carb, and high amounts of lean protein in diet.   - Patient should also consume adequate amounts of water.   Education and routine counseling performed. Handouts provided.   Meds ordered this encounter  Medications  . traZODone (DESYREL) 50 MG tablet    Sig: Take 0.5-1 tablets (25-50 mg total) by mouth at bedtime as needed for sleep.    Dispense:  90 tablet    Refill:  0    Will need to contact us for further RF's  . sertraline (ZOLOFT) 25 MG tablet    Sig: Take 0.5 tablets (12.5 mg total) by mouth daily for 10 days, THEN 1 tablet (25 mg total) daily for 10 days, THEN 1.5 tablets (37.5 mg total) daily for 10 days, THEN 2 tablets (50 mg total) daily.    Dispense:  160 tablet  Refill:  0    Gross side effects, risk and benefits, and alternatives of medications and treatment plan in general discussed with patient.  Patient is aware that all medications have potential side effects and we are unable to predict every side effect or drug-drug interaction that may occur.   Patient will call with any questions prior to using medication if they have concerns.  Expresses verbal  understanding and consents to current therapy and treatment regimen.  No barriers to understanding were identified.  Red flag symptoms and signs discussed in detail.  Patient expressed understanding regarding what to do in case of emergency\urgent symptoms  Please see AVS handed out to patient at the end of our visit for further patient instructions/ counseling done pertaining to today's office visit.   Return for 2)6-8 wks after start mood and sleep meds.     Note:  This document was prepared using Dragon voice recognition software and may include unintentional dictation errors.   This document serves as a record of services personally performed by [name], DO. It was created on her behalf by Toni Amend, a trained medical scribe. The creation of this record is based on the scribe's personal observations and the provider's statements to them.   I have reviewed the above medical documentation for accuracy and completeness and I concur.  [name], DO 06/06/2018 4:55 PM      -------------------------------------------------------------------------------------------------------------------------------------    Subjective:    CC:  Chief Complaint  Patient presents with  . Anxiety    HPI: Nicole Gould is a 57 y.o. female who presents to [place] at [facility] today for follow-up of mood.   "I feel absolutely fabulous unless I get stressed."  Mood Management Notes that she needs medicine today.  "We tried the Prozac, and that made me feel horrible."  She tried this over the summer.  States it didn't work and that she's really sensitive to medicine.  She was never on any medication in the past for mood.  Current mood concerns: Anxiety.  "I have an anxiety problem."  Notes in the past, she wanted to try to fight it off herself; but now doesn't think she can do it on her own.  Her job is stressful.  Whenever she is confronted at work,  she feels anxious.  "If somebody wrongly accuses me, or my kids get on my nerves, anything like that, I tend to not be able to catch my breath; I breathe kinda hard."  When she's talking, she can't catch her breath.  She is currently talking to a counselor.  Counselor told her "I need to rewire my brain."  Has been visiting counseling once weekly.  Says "I'm not a medicine person, I don't want to be on medication, but I think I may need something."  When she gets this way she gets headaches, "and I just feel crappy."  Exercise Habits Is walking 4, sometimes 5 miles.  This takes her about an hour and a half or so.  Notes she's been trying to lose weight.  She plans on resuming workouts on Saturdays with a friend.  Says she's very proud of herself because she had really started gaining weight "and the blood pressure was going crazy."  Hypertension States "they still have not controlled my blood pressure."  Insomnia Says that menopause is "eating her up" and preventing her from sleeping well.  She tried black cohosh and melatonin in the past with no improvement.  Notes  she wakes up often at night to urinate due to the medication she is taking through cardiology (for hypertension).    Depression screen [facility] 2/9 06/06/2018 03/08/2018 02/08/2018  Decreased Interest - 0 0  Down, Depressed, Hopeless 1 0 0  PHQ - 2 Score 1 0 0  Altered sleeping 1 2 0  Tired, decreased energy 1 1 1   Change in appetite - 0 0  Feeling bad or failure about yourself  0 0 0  Trouble concentrating 1 0 1  Moving slowly or fidgety/restless 0 0 0  Suicidal thoughts 0 0 0  PHQ-9 Score 4 3 2   Difficult doing work/chores Somewhat difficult Somewhat difficult -     GAD 7 : Generalized Anxiety Score 06/06/2018  Nervous, Anxious, on Edge 1  Control/stop worrying 1  Worry too much - different things 0  Trouble relaxing 1  Restless 0  Easily annoyed or irritable 1  Afraid - awful might happen 1  Total GAD 7 Score 5    Anxiety Difficulty Somewhat difficult     Wt Readings from Last 3 Encounters:  06/06/18 184 lb (83.5 kg)  06/05/18 186 lb 8 oz (84.6 kg)  03/21/18 189 lb 3.2 oz (85.8 kg)   BP Readings from Last 3 Encounters:  06/06/18 134/78  06/05/18 (!) 143/91  05/10/18 (!) 174/92   Pulse Readings from Last 3 Encounters:  06/06/18 66  06/05/18 (!) 57  05/10/18 (!) 55   BMI Readings from Last 3 Encounters:  06/06/18 31.58 kg/m  06/05/18 32.01 kg/m  03/21/18 32.48 kg/m         Patient Care Team    Relationship Specialty Notifications Start End  [name], DO PCP - General Family Medicine  02/08/18   Minna Merritts, MD PCP - Cardiology Cardiology Admissions 06/05/18   Minna Merritts, MD Consulting Physician Cardiology  03/14/13   [name], MD  Internal Medicine  02/08/18   Malon Kindle, MD Referring Physician Neurology  02/08/18   Pyrtle, Lajuan Lines, MD Consulting Physician Gastroenterology  02/08/18   Jacelyn Pi, MD Consulting Physician Endocrinology  02/08/18   Allyn Kenner, DO Consulting Physician Obstetrics and Gynecology  02/08/18      Patient Active Problem List   Diagnosis Date Noted  . Prediabetes 11/10/2017    Priority: High  . Essential hypertension, benign 01/28/2013    Priority: High  . Mood disorder ([place]) 03/07/2018    Priority: Medium  . 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 Primary insomnia 05/05/2014    Priority: Medium  . Hx of seizure disorder 11/10/2017    Priority: Low  . Migraine without aura and without status migrainosus, not intractable 11/10/2017    Priority: Low  . Spells of trembling 11/10/2017    Priority: Low  . 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 Chronic venous insufficiency 04/21/2014    Priority: Low  . 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 Insomnia due to stress, anxiety and fear 06/06/2018  . Noncompliance with diet and medication  regimen 04/21/2018  . Polyp of corpus uteri 02/08/2018  . Near syncope 10/13/2017  . Sinusitis 07/16/2017  . Edema 04/29/2017  . 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 Dizziness 10/08/2016  . Obesity with body mass index 30 or greater 08/09/2016  . Vaginal discharge 08/09/2016  . History of adenomatous polyp of colon 08/04/2016  .  Menopausal symptom 10/25/2014  . Anxiety 05/05/2014  . Adenomatous polyp of colon 05/12/2013  . Visit for preventive health examination 03/14/2013  . Pain 03/11/2013  . Vulvitis 01/28/2013    Past Medical history, Surgical history, Family history, Social history, Allergies and Medications have been entered into the medical record, reviewed and changed as needed.    Current Meds  Medication Sig  . Cholecalciferol (VITAMIN D-3) 1000 UNITS CAPS Take 1 capsule by mouth daily.  . fluticasone (FLONASE) 50 MCG/ACT nasal spray SHAKE LQ AND U 1 SPR IEN D  . hydrochlorothiazide (MICROZIDE) 12.5 MG capsule Take 1 capsule (12.5 mg total) by mouth daily.  Marland Kitchen MAGNESIUM MALATE PO Take 1 tablet by mouth daily.   . Multiple Vitamins-Minerals (MULTIVITAMIN WITH MINERALS) tablet Take 1 tablet by mouth daily.  . nebivolol (BYSTOLIC) 10 MG tablet Take 1 tablet (10 mg total) by mouth daily.  . Omega-3 Fatty Acids (FISH OIL) 1200 MG CAPS Take 1,200 mg by mouth daily.   . pantoprazole (PROTONIX) 40 MG tablet TAKE 1 TABLET (40 MG TOTAL) BY MOUTH DAILY BEFORE BREAKFAST.  Marland Kitchen spironolactone (ALDACTONE) 50 MG tablet Take 1/2 tablet (25 mg) by mouth once daily    Allergies:  Allergies  Allergen Reactions  . Losartan Shortness Of Breath    Dizziness,  Shortness of breath   . Anesthetics, Amide Nausea And Vomiting  . Benadryl [Diphenhydramine Hcl (Sleep)]     Made feel paralyzed when had benadryl with a cocktail for procedure  Decadron, bendayl, regaln combo at [facility] 2014- see care every where   . Benicar [Olmesartan]      Presyncopal , light headed   . Cardura [Doxazosin Mesylate] Other (See Comments)    "makes me feel terrible"  . Codeine     Other reaction(s): Unknown  . Fentanyl Hypertension    Severe htn   . Losartan Potassium-Hctz     Dizziness,  Shortness of breath   . Metformin And Related     "out of body experience"    . Prilocaine Nausea And Vomiting     Review of Systems: Review of Systems: General:   No F/C, wt loss Pulm:   No DIB, SOB, pleuritic chest pain Card:  No CP, palpitations Abd:  No n/v/d or pain Ext:  No inc edema from baseline Psych: no SI/ HI    Objective:   Blood pressure 134/78, pulse 66, height 5\' 4"  (1.626 m), weight 184 lb (83.5 kg), last menstrual period 12/01/2013, SpO2 100 %. Body mass index is 31.58 kg/m. General:  Well Developed, well nourished, appropriate for stated age.  Neuro:  Alert and oriented,  extra-ocular muscles intact  HEENT:  Normocephalic, atraumatic, neck supple, no carotid bruits appreciated  Skin:  no gross rash, warm, pink. Cardiac:  RRR, S1 S2 Respiratory:  ECTA B/L and A/P, Not using accessory muscles, speaking in full sentences- unlabored. Vascular:  Ext warm, no cyanosis apprec.; cap RF less 2 sec. Psych:  No HI/SI, judgement and insight good, Euthymic mood. Full Affect.

## 2018-06-06 NOTE — Telephone Encounter (Signed)
Called the patient yesterday and was unable to reach the patient.  Patient has appt today 06/06/2018. MPulliam, CMA/RT(R)

## 2018-06-06 NOTE — Progress Notes (Deleted)
ambu

## 2018-06-11 ENCOUNTER — Other Ambulatory Visit: Payer: No Typology Code available for payment source | Admitting: *Deleted

## 2018-06-11 ENCOUNTER — Telehealth: Payer: Self-pay

## 2018-06-11 DIAGNOSIS — Z79899 Other long term (current) drug therapy: Secondary | ICD-10-CM

## 2018-06-11 DIAGNOSIS — I10 Essential (primary) hypertension: Secondary | ICD-10-CM

## 2018-06-11 LAB — BASIC METABOLIC PANEL
BUN/Creatinine Ratio: 26 — ABNORMAL HIGH (ref 9–23)
BUN: 21 mg/dL (ref 6–24)
CO2: 24 mmol/L (ref 20–29)
Calcium: 9.2 mg/dL (ref 8.7–10.2)
Chloride: 101 mmol/L (ref 96–106)
Creatinine, Ser: 0.8 mg/dL (ref 0.57–1.00)
GFR calc Af Amer: 93 mL/min/{1.73_m2} (ref >59)
GFR, EST NON AFRICAN AMERICAN: 81 mL/min/{1.73_m2} (ref >59)
Glucose: 90 mg/dL (ref 65–99)
Potassium: 4 mmol/L (ref 3.5–5.2)
Sodium: 140 mmol/L (ref 134–144)

## 2018-06-11 NOTE — Telephone Encounter (Signed)
Notes recorded by Theora Gianotti, NP on 06/06/2018 at 12:27 PM EST Renal fxn/lytes ok. Plan to f/u bmet in 1 wk (discussed yesterday) since she's starting back on hctz.

## 2018-06-11 NOTE — Telephone Encounter (Signed)
Received call from lab, the orders were not able to be seen, order placed for BMET.

## 2018-06-13 NOTE — Telephone Encounter (Signed)
BUN and creat were normal. Ratio is minimally elevated - not clinically significant.   If she'd like to take either the HCTZ or the spiro every other day to limit diuresis, that's fine.  Agree re: pelvic pain  if issue persists, she should f/u with pcp.

## 2018-06-13 NOTE — Telephone Encounter (Signed)
Reviewed provider recommendations that she could try alternating the HCTZ and spironolactone. Also reviewed that she should check with her PCP for any continued cramps she may be having. She verbalized understanding with no further questions at this time.

## 2018-06-13 NOTE — Telephone Encounter (Signed)
Would also like to check the status of lab results

## 2018-06-13 NOTE — Telephone Encounter (Signed)
Spoke with patient and she states that she is going to the bathroom too much. She feels that her bladder is working too hard and it is just making her go too much. She feels that the spirolactone and hydrochlorothiazide together is too much and wanted to know if there were any other recommendations with these two medications. She also wanted to know why her BUN/Creat ratio is abnormal. Reviewed that her other results were normal. She states that her bladder is feeling overworked and she is feeling achy all around her pelvic area and lower back. She has been drinking water but it makes her pee more and hurt. Reviewed that she may want to check with her primary care provider but she states that this is not due to any type of urinary infection. She wanted to see if provider has any recommendations regarding the two diuretics she is taking. Advised that I would send this message over for his review and would be in touch with her. She verbalized understanding with no further questions at this time.

## 2018-06-13 NOTE — Telephone Encounter (Signed)
Pt c/o medication issue:  1. Name of Medication: spironolactone & hydrochlorothiazide   2. How are you currently taking this medication (dosage and times per day)? Has cut back on spirolactone and increased hydrochlorothiazide   3. Are you having a reaction (difficulty breathing--STAT)? Yes, patient states she is peeing too much, so much so that she is getting cramps and is painful.   4. What is your medication issue? Patient states that she is having issues with the medication change due to peeing too much.  States that she likes the hydrochlorothiazide because it has really helped her BP (states that BP is perfect, a little on the low side) but would like to see about replacing the spironolactone.  Please call to discuss.

## 2018-06-26 ENCOUNTER — Ambulatory Visit: Payer: No Typology Code available for payment source

## 2018-06-26 NOTE — Progress Notes (Deleted)
Patient ID: Nicole Gould                 DOB: 04/24/1961                      MRN: [id number]     HPI: Nicole Gould is a 58 y.o. female patient of Dr. Rockey Situ who presents today for hypertension follow up. PMH significant for palpitations, HTN, chronic dizziness/syncope, and anxiety. Orthostatics negative in the past. She had an episode of presyncope at work on June 5, BP 148/78. HR 64, CBG 88. Patient has been complaining of dizziness at all ranges of blood pressure. Medications have been adjusted and dizziness persists. At last visit with Ignacia Bayley, spironolactone was decreased to 25mg  daily due to breast tenderness/fullness and HCTZ 12.5mg  daily was started. Repeat BMP was WNL. Patient presents today for followup   Current HTN meds:  Bystolic 10mg  daily (AM) Spironolactone 25mg  daily  HCTZ 12.5mg  daily  Previously tried:  Diltiazem 1mg  and 2mg  - dizziness, hypotnsion HCTZ - polyuria, dry mouth, worried about low K Amlodipine 5mg  daily - LEE, dizzy Losartan - malaise Lisinopril - cough Spironolactone 50mg - breast tenderness/fullness  BP goal: <130/80 mmHg  Family History: Family history includes Cancer in her maternal grandfather; Colon cancer in her maternal grandfather; Diabetes in her father and mother; Heart disease in her father; Hypertension in her father and mother; Kidney disease in her father.  Social History: The patientreports that she has never smoked. She has never used smokeless tobacco. She reports that she drinks alcohol. She reports that she does not use drugs.  Diet: Does endorse some salt with her food, avoids caffeine.  Exercise: Walks 3-5 miles every morning. BP great after walking - systolic [id number].  Home BP readings:  10/23 7:30 am - 163/68 - 61  10:00 pm - 134/76 - 83 10/24 10:00 am - 133/72 - 74 -- after walking   10:00 pm - 149/74 - 61 04/12/18 8:00 am - 117/67 - 68 -- before walking, felt dizzy  8:30 am - 153/75 - 63  4:45 pm - 147/69 -  68 -- before meds (5PM) 04/13/18 7:45 am - 140/63 - 61 -- before meds 04/14/18 8:30 pm - 148/73 - 64 04/15/18 6:45 am - 137/67 - 72 04/16/18 6:45 am - 137/75 - 61 04/17/18 6:50 am - 105/65 - 65  2:30 pm - 147/68 - 66 04/18/18 5:00 pm - 132/64 - 70 04/19/18 7:45 am - 139/67 - 67  12:23 pm - 141/62 - 62 04/21/18 7:30 am - 154/72 - 63 04/22/18 6:45 am - 146/64 - 69 04/30/18 7:15 am - 142/65 - 63  10:30 pm - 154/68 - 56 -- stopped amlodipine, started spironolactone 05/01/18 11:00 am - 140/69 - 64 05/04/18 7:30 am - 115/66 - 65 05/05/18 7:30 pm - 177/75 - 60  Before bed - 202/76   Right after - 177/75 05/06/18 7:30 am - 111/57 - 67 05/07/18 7:00 am - 148/71 - 66  10:30 pm - 155/65 - 58 05/08/18 5:30 pm - 159/75 - 67 05/09/18 10:00 am - 149/71 - 67 05/10/18 7:30 am - 171/76 - 59   Wt Readings from Last 3 Encounters:  06/06/18 184 lb (83.5 kg)  06/05/18 186 lb 8 oz (84.6 kg)  03/21/18 189 lb 3.2 oz (85.8 kg)   BP Readings from Last 3 Encounters:  06/06/18 134/78  06/05/18 (!) 143/91  05/10/18 (!) 174/92   Pulse Readings from Last 3 Encounters:  06/06/18 66  06/05/18 (!) 57  05/10/18 (!) 55    Renal function: CrCl cannot be calculated (Unknown ideal weight.).  Past Medical History:  Diagnosis Date  . Chicken pox   . Chronic Dizziness   . Colon polyps   . Concussion   . Frequent headaches   . GERD (gastroesophageal reflux disease)   . History of stress test    a. 2005 - in setting of ectopy - reportedly nl.  . Hypertension    a. 03/2018 24hr ABM: Mean BP 146/75, mean HR 61; Mean awake BP/HR 150/78, 62; Mean sleep BP/HR 130/65, 58.  . Migraines   . Pre-eclampsia    with grand mal seizures  . Seizure disorder in pregnancy ([place])   . Seizures ([place])    with pregnancy 1981- pre clampsia     Current Outpatient Medications on File Prior to Visit  Medication Sig Dispense Refill  . Cholecalciferol (VITAMIN D-3) 1000 UNITS CAPS Take 1 capsule by mouth daily.    .  fluticasone (FLONASE) 50 MCG/ACT nasal spray SHAKE LQ AND U 1 SPR IEN D  0  . hydrochlorothiazide (MICROZIDE) 12.5 MG capsule Take 1 capsule (12.5 mg total) by mouth daily. 90 capsule 3  . MAGNESIUM MALATE PO Take 1 tablet by mouth daily.     . Multiple Vitamins-Minerals (MULTIVITAMIN WITH MINERALS) tablet Take 1 tablet by mouth daily.    . nebivolol (BYSTOLIC) 10 MG tablet Take 1 tablet (10 mg total) by mouth daily. 90 tablet 3  . Omega-3 Fatty Acids (FISH OIL) 1200 MG CAPS Take 1,200 mg by mouth daily.     . pantoprazole (PROTONIX) 40 MG tablet TAKE 1 TABLET (40 MG TOTAL) BY MOUTH DAILY BEFORE BREAKFAST. 30 tablet 0  . sertraline (ZOLOFT) 25 MG tablet Take 0.5 tablets (12.5 mg total) by mouth daily for 10 days, THEN 1 tablet (25 mg total) daily for 10 days, THEN 1.5 tablets (37.5 mg total) daily for 10 days, THEN 2 tablets (50 mg total) daily. 160 tablet 0  . spironolactone (ALDACTONE) 50 MG tablet Take 1/2 tablet (25 mg) by mouth once daily    . traZODone (DESYREL) 50 MG tablet Take 0.5-1 tablets (25-50 mg total) by mouth at bedtime as needed for sleep. 90 tablet 0   No current facility-administered medications on file prior to visit.     Allergies  Allergen Reactions  . Losartan Shortness Of Breath    Dizziness,  Shortness of breath   . Anesthetics, Amide Nausea And Vomiting  . Benadryl [Diphenhydramine Hcl (Sleep)]     Made feel paralyzed when had benadryl with a cocktail for procedure  Decadron, bendayl, regaln combo at [facility] 2014- see care every where   . Benicar [Olmesartan]     Presyncopal , light headed   . Cardura [Doxazosin Mesylate] Other (See Comments)    "makes me feel terrible"  . Codeine     Other reaction(s): Unknown  . Fentanyl Hypertension    Severe htn   . Losartan Potassium-Hctz     Dizziness,  Shortness of breath   . Metformin And Related     "out of body experience"    . Prilocaine Nausea And Vomiting    Last menstrual period  12/01/2013.   Assessment/Plan:  1. Hypertension:   06/26/2018 8:20 AM

## 2018-06-27 ENCOUNTER — Ambulatory Visit (INDEPENDENT_AMBULATORY_CARE_PROVIDER_SITE_OTHER): Payer: No Typology Code available for payment source | Admitting: Pharmacist

## 2018-06-27 VITALS — BP 116/82 | HR 59

## 2018-06-27 DIAGNOSIS — I10 Essential (primary) hypertension: Secondary | ICD-10-CM

## 2018-06-27 MED ORDER — EPLERENONE 25 MG PO TABS: 25.0000 mg | ORAL_TABLET | Freq: Every day | ORAL | 11 refills | Status: DC

## 2018-06-27 NOTE — Progress Notes (Signed)
Patient ID: Nicole Gould                 DOB: 1961-05-05                      MRN: [id number]     HPI: Nicole Gould is a 58 y.o. female patient of Dr. Rockey Situ who presents today for hypertension follow up. PMH significant for palpitations, HTN, chronic dizziness/syncope, and anxiety. Orthostatics negative in the past. Patient has been complaining of dizziness at all ranges of blood pressure despite medication adjustments. Holter monitor was ordered to assess home BP further and average awake BP was 150/75 and average asleep BP was 130/65, showing a dipper pattern, which decreases the risk of cardiovascular events. Patient was referred to ENT to rule out inner ear issues and vertigo. Patient was last seen in BP clinic on November 2019 where spironolactone was increased to 37.5 mg daily as patient experienced less dizziness with spironolactone compared to amlodipine. Patient also reported being under a lot of stress and feeling anxious at work. Medical management on anxiety was discussed, but patient reported feeling poorly on fluoxetine in the past. Since the visit in November, patient has started on HCTZ 12.5 mg daily and spironolactone dose was increased to 50 mg. However, patient experienced breast tenderness and enlargement on dose of 50 mg daily and the dose was reduced down to 25 mg daily at an office visit with Ignacia Bayley, NP. Follow up BMET was stable.   Patient is in good spirits today. Patient reports taking prednisone due to her back pain, which increased her BP. Last dose of prednisone was taken on 06/23/18, and BP readings have improved since then to 120s/70s. Patient also started taking sertraline 25 mg 1 tablet daily per Dr. Raliegh Scarlet in December. Though she feels that her anxiety improved a little, she does feel like that her shoulders are being weighed down. She does not wish to continue with further dose titration - advised pt to follow up with prescribing MD to discuss this. Also  informed the patient that it may take up to 2 months for the anti-anxiety meds to fully take effect and advised against stopping sertraline herself as tapering is necessary.  Patient inquires about breast tenderness and enlargement associated with spironolactone and alternative options. Patient is very pleased to know that her BP is under control.   Current HTN meds:  Bystolic 10mg  daily (AM) HCTZ 12.5 mg daily (AM) Spironolactone 25 mg daily (AM)  Previously tried:  Diltiazem 1mg  and 2mg  - dizziness, hypotnsion HCTZ - polyuria, dry mouth, worried about low K Amlodipine 5mg  daily - LEE, dizzy Doxazosin 2 mg - dizziness Losartan - malaise Lisinopril - cough Olmesartan - lightheadedness and presyncope Spironolactone 50 mg - breast tenderness and enalrgement  BP goal: <130/80 mmHg  Family History: Family history includes Cancer in her maternal grandfather; Colon cancer in her maternal grandfather; Diabetes in her father and mother; Heart disease in her father; Hypertension in her father and mother; Kidney disease in her father.  Social History: The patientreports that she has never smoked. She has never used smokeless tobacco. She reports that she drinks alcohol. She reports that she does not use drugs.  Diet: Does endorse some salt with her food, avoids caffeine.  Exercise: Walks 4 miles at least 3x/week  Home BP readings: Since being off of prednisone, highest BP 166/68 (outlier). Lowest BP 112/57. Average BP 120-130s/60. HR 60s.   Wt Readings from Last 3  Encounters:  06/06/18 184 lb (83.5 kg)  06/05/18 186 lb 8 oz (84.6 kg)  03/21/18 189 lb 3.2 oz (85.8 kg)   BP Readings from Last 3 Encounters:  06/06/18 134/78  06/05/18 (!) 143/91  05/10/18 (!) 174/92   Pulse Readings from Last 3 Encounters:  06/06/18 66  06/05/18 (!) 57  05/10/18 (!) 55   Renal function: CrCl cannot be calculated (Unknown ideal weight.).  Past Medical History:  Diagnosis Date  . Chicken pox   .  Chronic Dizziness   . Colon polyps   . Concussion   . Frequent headaches   . GERD (gastroesophageal reflux disease)   . History of stress test    a. 2005 - in setting of ectopy - reportedly nl.  . Hypertension    a. 03/2018 24hr ABM: Mean BP 146/75, mean HR 61; Mean awake BP/HR 150/78, 62; Mean sleep BP/HR 130/65, 58.  . Migraines   . Pre-eclampsia    with grand mal seizures  . Seizure disorder in pregnancy ([place])   . Seizures ([place])    with pregnancy 1981- pre clampsia     Current Outpatient Medications on File Prior to Visit  Medication Sig Dispense Refill  . Cholecalciferol (VITAMIN D-3) 1000 UNITS CAPS Take 1 capsule by mouth daily.    . fluticasone (FLONASE) 50 MCG/ACT nasal spray SHAKE LQ AND U 1 SPR IEN D  0  . hydrochlorothiazide (MICROZIDE) 12.5 MG capsule Take 1 capsule (12.5 mg total) by mouth daily. 90 capsule 3  . MAGNESIUM MALATE PO Take 1 tablet by mouth daily.     . Multiple Vitamins-Minerals (MULTIVITAMIN WITH MINERALS) tablet Take 1 tablet by mouth daily.    . nebivolol (BYSTOLIC) 10 MG tablet Take 1 tablet (10 mg total) by mouth daily. 90 tablet 3  . Omega-3 Fatty Acids (FISH OIL) 1200 MG CAPS Take 1,200 mg by mouth daily.     . pantoprazole (PROTONIX) 40 MG tablet TAKE 1 TABLET (40 MG TOTAL) BY MOUTH DAILY BEFORE BREAKFAST. 30 tablet 0  . sertraline (ZOLOFT) 25 MG tablet Take 0.5 tablets (12.5 mg total) by mouth daily for 10 days, THEN 1 tablet (25 mg total) daily for 10 days, THEN 1.5 tablets (37.5 mg total) daily for 10 days, THEN 2 tablets (50 mg total) daily. 160 tablet 0  . spironolactone (ALDACTONE) 50 MG tablet Take 1/2 tablet (25 mg) by mouth once daily    . traZODone (DESYREL) 50 MG tablet Take 0.5-1 tablets (25-50 mg total) by mouth at bedtime as needed for sleep. 90 tablet 0   No current facility-administered medications on file prior to visit.     Allergies  Allergen Reactions  . Losartan Shortness Of Breath    Dizziness,  Shortness of breath   .  Anesthetics, Amide Nausea And Vomiting  . Benadryl [Diphenhydramine Hcl (Sleep)]     Made feel paralyzed when had benadryl with a cocktail for procedure  Decadron, bendayl, regaln combo at [facility] 2014- see care every where   . Benicar [Olmesartan]     Presyncopal , light headed   . Cardura [Doxazosin Mesylate] Other (See Comments)    "makes me feel terrible"  . Codeine     Other reaction(s): Unknown  . Fentanyl Hypertension    Severe htn   . Losartan Potassium-Hctz     Dizziness,  Shortness of breath   . Metformin And Related     "out of body experience"    . Prilocaine Nausea And Vomiting  Last menstrual period 12/01/2013.   Assessment/Plan:  1. Hypertension: Blood pressure much improved and now at goal of <130/80 mmHg per home log and in office reading. Due to concerns about breast enlargement with spironolactone, will switch spironolactone 25 mg to eplerenone 25 mg daily. Provided pt with GoodRx coupon card to help with copay. Will continue nebivolol 10mg  daily and HCTZ 12.5 mg daily. Considering her level of anxiety in the past, initiation of sertraline is most likely contributing to her BP control. Instructed the patient to continue her low sodium diet and physical activity and to call us if her BP starts to run high. F/u in HTN clinic as needed.  Patient seen in clinic by Almira Bar, PharmD Candidate  Shaniya Tashiro E. Osamu Olguin, PharmD, BCACP, [place] [address], [place], [place] [address] Phone: [phone]; Fax: [phone] 06/27/2018 10:19 AM

## 2018-07-06 ENCOUNTER — Other Ambulatory Visit: Payer: Self-pay | Admitting: Family Medicine

## 2018-07-06 ENCOUNTER — Other Ambulatory Visit: Payer: Self-pay | Admitting: Physician Assistant

## 2018-07-06 DIAGNOSIS — F5105 Insomnia due to other mental disorder: Principal | ICD-10-CM

## 2018-07-06 DIAGNOSIS — F409 Phobic anxiety disorder, unspecified: Secondary | ICD-10-CM

## 2018-07-27 ENCOUNTER — Other Ambulatory Visit: Payer: Self-pay | Admitting: Physician Assistant

## 2018-07-30 ENCOUNTER — Other Ambulatory Visit: Payer: Self-pay | Admitting: Internal Medicine

## 2018-07-31 ENCOUNTER — Other Ambulatory Visit: Payer: Self-pay | Admitting: Family Medicine

## 2018-07-31 DIAGNOSIS — F39 Unspecified mood [affective] disorder: Secondary | ICD-10-CM

## 2018-07-31 DIAGNOSIS — I10 Essential (primary) hypertension: Secondary | ICD-10-CM

## 2018-08-01 ENCOUNTER — Telehealth: Payer: Self-pay | Admitting: Cardiovascular Disease

## 2018-08-01 NOTE — Telephone Encounter (Signed)
bp's soft following event - certainly could have been lower during event and contributed to Ss. If this is isolated event, I would not change therapy @ this time, but cont to follow. Could potentially cut eplerenone in half if frequent symptomatic low bp's.

## 2018-08-01 NOTE — Telephone Encounter (Signed)
Spoke with patient and she reports having a episode this morning with vision changes and now she is feeling better. She denies any speech changes and no other reports of weakness. She did check her blood sugar and it was 97 and confirmed blood pressures she provided as well. She just wanted to make [facility] aware of this episode to see if she should have any concerns. Advised that I would send this to provider for review and would let her know any recommendations. Advised that she should continue monitoring. She was appreciative for the call with no further questions at this time.

## 2018-08-01 NOTE — Telephone Encounter (Signed)
Pt c/o BP issue: STAT if pt c/o blurred vision, one-sided weakness or slurred speech  1. What are your last 5 BP readings? This morning: 99/59 HR 63      30 min later 104/59 HR 59      30 min later  120/60 HR 62   2. Are you having any other symptoms (ex. Dizziness, headache, blurred vision, passed out)? Patient c/o weakness jittery  weird feeling and blurry side to side vision episode that lasted 5-10 minutes   3. What is your BP issue? Patient verbalized concerns of being on 3 bp meds  Noted recent switch from Spironolactone to eplerenone due to breast enlargement

## 2018-08-02 NOTE — Telephone Encounter (Signed)
Spoke with patient and reviewed his recommendations. She wanted to know if there was a comparable medication to eplerenone that will not cause breast enlargement. She states that her breasts increased with spironolactone and they switched her to eplerenone and they have not decreased. She would like to me to see what his thoughts are on this. Reviewed that some medications can have side effects but are needed in order to treat certain conditions. She just does not want enlarged breasts and would like to know if there are other options. Instructed her to please continue current medications, monitor her blood pressures, and she verbalized understanding.

## 2018-08-07 ENCOUNTER — Encounter: Payer: Self-pay | Admitting: Family Medicine

## 2018-08-07 ENCOUNTER — Ambulatory Visit (INDEPENDENT_AMBULATORY_CARE_PROVIDER_SITE_OTHER): Payer: No Typology Code available for payment source | Admitting: Family Medicine

## 2018-08-07 VITALS — BP 117/74 | HR 65 | Temp 98.0°F | Ht 64.0 in | Wt 190.0 lb

## 2018-08-07 DIAGNOSIS — F39 Unspecified mood [affective] disorder: Secondary | ICD-10-CM | POA: Diagnosis not present

## 2018-08-07 DIAGNOSIS — I10 Essential (primary) hypertension: Secondary | ICD-10-CM | POA: Diagnosis not present

## 2018-08-07 DIAGNOSIS — F5101 Primary insomnia: Secondary | ICD-10-CM | POA: Diagnosis not present

## 2018-08-07 NOTE — Progress Notes (Signed)
Impression and Recommendations:    1. Primary insomnia   2. Mood disorder ([place])   3. Essential hypertension, benign     - Patient was prescribed Zoloft and Trazodone last visit.  1. Mood Disorder - Per patient, has been taking one tablet of Zoloft for 6 weeks. - Has been having undesirable sexual side-effects.  - Patient desires to discontinue Zoloft.  - Discussed weaning down dose with patient today. - Start with a half-tablet of Zoloft daily for two weeks, and then begin a half-tablet every other day for two weeks, and then discontinue.  If patient feels okay, she may completely cease taking the Zoloft, but if she feels funny, she knows to go to a half-tablet every 3-4 days.  - Advised patient to ask her therapist for breathing and meditation techniques to learn, to utilize for natural stress management.  Discussed critical importance of a backup plan when discontinuing the Zoloft.  - Advised patient to look into strategies to cope with stress.  - Reviewed the "spokes of the wheel" of mood and health management.  Stressed the importance of ongoing prudent habits, including regular exercise, appropriate sleep hygiene, healthful dietary habits, and prayer/meditation to relax.   2. Primary Insomnia - Per patient, has not taken the trazodone at all.  - Advised patient to start with the smallest dose possible if patient desires to start on trazodone.  -Otherwise we discussed sleep meditation and other options.   3. Essential Hypertension, Benign - Followed by Cardiology - Advised patient to continue following up with Cardiology and hypertension clinic as recommended. - Treatment plan will continue to be managed by specialists - Will continue to monitor at home and advised to bring blood pressure log to all office visits with all doctors.   4. Lifestyle & Preventative Health Maintenance - Advised patient to continue working toward exercising to improve overall mental,  physical, and emotional health.    - American Heart Association guidelines for healthy diet, basically Mediterranean diet, and exercise guidelines of 30 minutes 5 days per week or more discussed in detail.  - Health counseling performed.  All questions answered.  - Encouraged patient to engage in daily physical activity, especially a formal exercise routine.  Recommended that the patient eventually strive for at least 150 minutes of moderate cardiovascular activity per week according to guidelines established by the [facility].   - Healthy dietary habits encouraged, including low-carb, and high amounts of lean protein in diet.   - Patient should also consume adequate amounts of water.   Education and routine counseling performed. Handouts provided.    Medications Discontinued During This Encounter  Medication Reason  . sertraline (ZOLOFT) 25 MG tablet       Gross side effects, risk and benefits, and alternatives of medications and treatment plan in general discussed with patient.  Patient is aware that all medications have potential side effects and we are unable to predict every side effect or drug-drug interaction that may occur.   Patient will call with any questions prior to using medication if they have concerns.  Expresses verbal understanding and consents to current therapy and treatment regimen.  No barriers to understanding were identified.  Red flag symptoms and signs discussed in detail.  Patient expressed understanding regarding what to do in case of emergency\urgent symptoms  Please see AVS handed out to patient at the end of our visit for further patient instructions/ counseling done pertaining to today's office visit.   Return for  Follow-up 4-6 months since you are doing so well; sooner if needed.     Note:  This document was prepared using Dragon voice recognition software and may include unintentional dictation errors.   This document serves as a record of services  personally performed by [name], DO. It was created on her behalf by Toni Amend, a trained medical scribe. The creation of this record is based on the scribe's personal observations and the provider's statements to them.   I have reviewed the above medical documentation for accuracy and completeness and I concur.  [name], DO 08/08/2018 2:44 PM      -----------------------------------------------------------------------------------------------------------------------------------    Subjective:    CC:  Chief Complaint  Patient presents with  . Follow-up    HPI: Nicole Gould is a 58 y.o. female who presents to [place] [facility] today for follow-up of mood.   Was prescribed Zoloft and Trazodone last visit.  Notes she walks in her neighborhood to exercise, at least 10,000+ steps per day.  Exercises Wednesday, Thursday, Friday, sometimes Saturday, and sometimes Sunday.  She works on Monday and Tuesday.  Takes apple cider tablets every morning and states these low her blood pressure.  Sexual Side-Effects on Zoloft States she's been taking one tablet of Zoloft every day for 6 weeks, "and I really want to get off of it."  She wants to wean herself back off of it and try something natural.  Says "it's messing with my romantic feeling."  Says "it takes it all away."  Say "I don't like that I can't feel."  She's only working two days per week.  Notes she still gets anxious and disturbed; "I get irritated easily, like if somebody is gnawing at me."  Says "I still feel that way, but I didn't want to go up [on the dose of Zoloft] because of how it was making me feel."  Visits to Counselor Patient has been going to a counselor, but stopped visiting recently due to insurance issues.  She plans to resume visiting her counselor next week.  Patient states she hasn't been doing her therapist's homework because she's been out of town a  lot.  Patient notes she was given a little video that helps her relax.  Patient is not doing it every day, "but almost every day."  Insomnia Per patient, has not used the Trazodone at all.  Blood Pressure Clinic Patient was on spironolactone along with her other heart medicines.  Notes that the spironolactone made her breasts huge and she did not like this.  Notes that her blood pressure fluctuates, low sometimes, and is not often over 140.  Patient states that she was discharged by the Hypertension Clinic recently.    Depression screen [facility] 2/9 08/07/2018 06/06/2018 03/08/2018  Decreased Interest 0 - 0  Down, Depressed, Hopeless 0 1 0  PHQ - 2 Score 0 1 0  Altered sleeping 1 1 2   Tired, decreased energy 1 1 1   Change in appetite 0 - 0  Feeling bad or failure about yourself  0 0 0  Trouble concentrating 1 1 0  Moving slowly or fidgety/restless 0 0 0  Suicidal thoughts 0 0 0  PHQ-9 Score 3 4 3   Difficult doing work/chores Somewhat difficult Somewhat difficult Somewhat difficult     GAD 7 : Generalized Anxiety Score 08/07/2018 06/06/2018  Nervous, Anxious, on Edge 0 1  Control/stop worrying 1 1  Worry too much - different things 0 0  Trouble relaxing 1 1  Restless 0 0  Easily annoyed or irritable 1 1  Afraid - awful might happen 0 1  Total GAD 7 Score 3 5  Anxiety Difficulty - Somewhat difficult     Wt Readings from Last 3 Encounters:  08/07/18 190 lb (86.2 kg)  06/06/18 184 lb (83.5 kg)  06/05/18 186 lb 8 oz (84.6 kg)   BP Readings from Last 3 Encounters:  08/07/18 117/74  06/27/18 116/82  06/06/18 134/78   Pulse Readings from Last 3 Encounters:  08/07/18 65  06/27/18 (!) 59  06/06/18 66   BMI Readings from Last 3 Encounters:  08/07/18 32.61 kg/m  06/06/18 31.58 kg/m  06/05/18 32.01 kg/m         Patient Care Team    Relationship Specialty Notifications Start End  [name], DO PCP - General Family Medicine  02/08/18   Minna Merritts, MD  PCP - Cardiology Cardiology Admissions 06/05/18   Minna Merritts, MD Consulting Physician Cardiology  03/14/13   [name], MD  Internal Medicine  02/08/18   Malon Kindle, MD Referring Physician Neurology  02/08/18   Pyrtle, Lajuan Lines, MD Consulting Physician Gastroenterology  02/08/18   Jacelyn Pi, MD Consulting Physician Endocrinology  02/08/18   Allyn Kenner, DO Consulting Physician Obstetrics and Gynecology  02/08/18      Patient Active Problem List   Diagnosis Date Noted  . Prediabetes 11/10/2017    Priority: High  . Essential hypertension, benign 01/28/2013    Priority: High  . Mood disorder ([place]) 03/07/2018    Priority: Medium  . 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 Primary insomnia 05/05/2014    Priority: Medium  . Hx of seizure disorder 11/10/2017    Priority: Low  . Migraine without aura and without status migrainosus, not intractable 11/10/2017    Priority: Low  . Spells of trembling 11/10/2017    Priority: Low  . 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 Chronic venous insufficiency 04/21/2014    Priority: Low  . 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 Insomnia due to stress, anxiety and fear 06/06/2018  . Noncompliance with diet and medication regimen 04/21/2018  . Polyp of corpus uteri 02/08/2018  . Near syncope 10/13/2017  . Sinusitis 07/16/2017  . Edema 04/29/2017  . 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 Dizziness 10/08/2016  . Obesity with body mass index 30 or greater 08/09/2016  . Vaginal discharge 08/09/2016  . History of adenomatous polyp of colon 08/04/2016  . Menopausal symptom 10/25/2014  . Anxiety 05/05/2014  . Adenomatous polyp of colon 05/12/2013  . Visit for preventive health examination 03/14/2013  . Pain 03/11/2013  . Vulvitis 01/28/2013    Past Medical history, Surgical history, Family history, Social  history, Allergies and Medications have been entered into the medical record, reviewed and changed as needed.    Current Meds  Medication Sig  . APPLE CIDER VINEGAR PO Take 1 tablet by mouth 2 (two) times daily.  . Cholecalciferol (VITAMIN D-3) 1000 UNITS CAPS Take 1 capsule by mouth daily.  Marland Kitchen eplerenone (INSPRA) 25 MG tablet Take 1 tablet (25 mg total) by mouth daily.  . fluticasone (FLONASE) 50 MCG/ACT nasal spray SHAKE LQ AND U 1 SPR IEN D  . hydrochlorothiazide (MICROZIDE) 12.5 MG capsule Take 1 capsule (12.5 mg total) by mouth daily.  Marland Kitchen  MAGNESIUM MALATE PO Take 1 tablet by mouth daily.   . Misc Natural Products (APPLE CIDER VINEGAR DIET PO) Take 250 mg by mouth daily.  . Multiple Vitamins-Minerals (MULTIVITAMIN WITH MINERALS) tablet Take 1 tablet by mouth daily.  . nebivolol (BYSTOLIC) 10 MG tablet Take 1 tablet (10 mg total) by mouth daily.  . pantoprazole (PROTONIX) 40 MG tablet TAKE 1 TABLET (40 MG TOTAL) BY MOUTH DAILY BEFORE BREAKFAST.  Marland Kitchen Specialty Vitamins Products (VITAMINS FOR THE HAIR PO) Take 1 tablet by mouth daily.  . [DISCONTINUED] sertraline (ZOLOFT) 25 MG tablet Take 2 tablets (50 mg total) by mouth daily.    Allergies:  Allergies  Allergen Reactions  . Losartan Shortness Of Breath    Dizziness,  Shortness of breath   . Anesthetics, Amide Nausea And Vomiting  . Benadryl [Diphenhydramine Hcl (Sleep)]     Made feel paralyzed when had benadryl with a cocktail for procedure  Decadron, bendayl, regaln combo at [facility] 2014- see care every where   . Benicar [Olmesartan]     Presyncopal , light headed   . Cardura [Doxazosin Mesylate] Other (See Comments)    "makes me feel terrible"  . Codeine     Other reaction(s): Unknown  . Fentanyl Hypertension    Severe htn   . Losartan Potassium-Hctz     Dizziness,  Shortness of breath   . Metformin And Related     "out of body experience"    . Prilocaine Nausea And Vomiting     Review of Systems: Review of  Systems: General:   No F/C, wt loss Pulm:   No DIB, SOB, pleuritic chest pain Card:  No CP, palpitations Abd:  No n/v/d or pain Ext:  No inc edema from baseline Psych: no SI/ HI    Objective:   Blood pressure 117/74, pulse 65, temperature 98 F (36.7 C), height 5\' 4"  (1.626 m), weight 190 lb (86.2 kg), last menstrual period 12/01/2013, SpO2 98 %. Body mass index is 32.61 kg/m. General:  Well Developed, well nourished, appropriate for stated age.  Neuro:  Alert and oriented,  extra-ocular muscles intact  HEENT:  Normocephalic, atraumatic, neck supple, no carotid bruits appreciated  Skin:  no gross rash, warm, pink. Cardiac:  RRR, S1 S2 Respiratory:  ECTA B/L and A/P, Not using accessory muscles, speaking in full sentences- unlabored. Vascular:  Ext warm, no cyanosis apprec.; cap RF less 2 sec. Psych:  No HI/SI, judgement and insight good, Euthymic mood. Full Affect.

## 2018-08-07 NOTE — Patient Instructions (Addendum)
-  Since your mood and blood pressure is doing so well Nicole Gould, follow-up with [place] in 4 to 6 months and as needed.  Great job on getting things under better control.  Keep up the good work.  Take a half-tablet of Sertraline (Zoloft) daily for two weeks, and then begin a half-tablet every other day for two weeks, and then discontinue use of the Zoloft.  If you feel okay, you may completely cease taking the Zoloft, but if you feel funny at all, go back to a half-tablet every 3-4 days to continue weaning.  Begin the trazodone at a half tablet per night as needed for sleep    If you have insomnia or difficulty sleeping, this information is for you:  - Avoid caffeinated beverages after lunch,  no alcoholic beverages,  no eating within 2-3 hours of lying down,  avoid exposure to blue light before bed,  avoid daytime naps, and  needs to maintain a regular sleep schedule- go to sleep and wake up around the same time every night.   - Resolve concerns or worries before entering bedroom:  Discussed relaxation techniques with patient and to keep a journal to write down fears\ worries.  I suggested seeing a counselor for CBT.   - Recommend patient meditate or do deep breathing exercises to help relax.   Incorporate the use of white noise machines or listen to "sleep meditation music", or recordings of guided meditations for sleep from YouTube which are free, such as  "guided meditation for detachment from over thinking"  by Mayford Knife.

## 2018-08-12 ENCOUNTER — Telehealth: Payer: Self-pay | Admitting: Pharmacist

## 2018-08-12 MED ORDER — SPIRONOLACTONE 25 MG PO TABS: ORAL_TABLET | ORAL | 11 refills | Status: DC

## 2018-08-12 NOTE — Telephone Encounter (Signed)
Pt called to report some dizziness, lightheadedness, and occasional pain in her breast since switching from spironolactone to eplerenone. She states that she noticed breast enlargement with the spironolactone but never noticed any pain, and reports that since changing to eplerenone, her breasts have remained the same size but she is experiencing some pain. She saw her PCP who stated she would defer to cardiology. Pt prefers to switch back to spironolactone since this did not cause pain. Advised pt to resume spironolactone 12.5mg  daily, and if her BP remains elevated above goal <130/53mmHg in 1 week, to increase dose back to 25mg  daily (she previously took this dose but did experience some hyoptension). She will follow up on tolerability with Ignacia Bayley, NP on 3/18 office visit.

## 2018-08-21 ENCOUNTER — Ambulatory Visit: Payer: No Typology Code available for payment source | Admitting: Family Medicine

## 2018-08-28 ENCOUNTER — Other Ambulatory Visit: Payer: Self-pay | Admitting: Physician Assistant

## 2018-08-30 ENCOUNTER — Other Ambulatory Visit: Payer: Self-pay | Admitting: Family Medicine

## 2018-08-30 DIAGNOSIS — F39 Unspecified mood [affective] disorder: Secondary | ICD-10-CM

## 2018-08-30 DIAGNOSIS — I10 Essential (primary) hypertension: Secondary | ICD-10-CM

## 2018-09-01 ENCOUNTER — Other Ambulatory Visit: Payer: Self-pay | Admitting: Physician Assistant

## 2018-09-03 ENCOUNTER — Telehealth: Payer: Self-pay | Admitting: Nurse Practitioner

## 2018-09-03 ENCOUNTER — Telehealth: Payer: Self-pay | Admitting: Physician Assistant

## 2018-09-03 NOTE — Telephone Encounter (Signed)
Spoke with patient and she wants to keep scheduled appointment due to medications causing dizziness and breast enlargement. She has tried other options and due to wait for appointment she does not want to reschedule at this time and confirmed her appointment time and provider with her. She was appreciative for the call with no further questions at this time.   COVID-19 Pre-Screening:  1. Have you been in contact with someone who was sick?  No 2. Do you have any of the following symptoms (cough, fever, muscle pain, vomiting, diarrhea, weakness abdominal pain, rash, red eye, bruising or bleeding, joint pain, severe headache)?  No 3. Have you travelled internationally or out of state in the last month?  [place] 3 weeks ago   4. Do you need any refills at this time?  No  Patient aware of the following: Please be advised that we require, no one but yourself to come to appointment. If necessary, only one visitor may come with you into the building. They will also be asked the same screening questions.

## 2018-09-03 NOTE — Telephone Encounter (Signed)
Pt would like to know if she can get a 29month supply for her med pantoprazole (PROTONIX) 40 MG.

## 2018-09-04 ENCOUNTER — Other Ambulatory Visit: Payer: Self-pay

## 2018-09-04 ENCOUNTER — Ambulatory Visit (INDEPENDENT_AMBULATORY_CARE_PROVIDER_SITE_OTHER): Payer: No Typology Code available for payment source | Admitting: Cardiovascular Disease

## 2018-09-04 ENCOUNTER — Ambulatory Visit: Payer: No Typology Code available for payment source | Admitting: Nurse Practitioner

## 2018-09-04 ENCOUNTER — Encounter: Payer: Self-pay | Admitting: Cardiovascular Disease

## 2018-09-04 VITALS — BP 142/90 | HR 68 | Ht 64.0 in | Wt 191.0 lb

## 2018-09-04 DIAGNOSIS — I10 Essential (primary) hypertension: Secondary | ICD-10-CM

## 2018-09-04 DIAGNOSIS — R55 Syncope and collapse: Secondary | ICD-10-CM

## 2018-09-04 MED ORDER — PANTOPRAZOLE SODIUM 40 MG PO TBEC: 40.0000 mg | DELAYED_RELEASE_TABLET | Freq: Every day | ORAL | 0 refills | Status: DC

## 2018-09-04 MED ORDER — AMLODIPINE BESYLATE 2.5 MG PO TABS: 2.5000 mg | ORAL_TABLET | Freq: Every day | ORAL | 3 refills | Status: DC

## 2018-09-04 NOTE — Telephone Encounter (Signed)
Yes mam. Thanks-JLL

## 2018-09-04 NOTE — Patient Instructions (Addendum)
Medication Instructions:  Stop the spironolactone, take only as needed for SBP >170  Increase the bystolic up to 15 mg in the AM  Stay on HCTZ 12.5 daily Apple cider Vin tab  If pressures run high, Increase the bystolic up to 20 mg daily Or add back the amlodipine/norvasc 2.5 mg once a day in the AM  Medication Samples have been provided to the patient.  Drug name: Bystolic       Strength: 5 mg & 10 mg        Qty: 3 boxes & 3 boxes  LOT: [id number]  Exp.Date: 08/21 & 10/21   If you need a refill on your cardiac medications before your next appointment, please call your pharmacy.    Lab work: No new labs needed   If you have labs (blood work) drawn today and your tests are completely normal, you will receive your results only by: Marland Kitchen MyChart Message (if you have MyChart) OR . A paper copy in the mail If you have any lab test that is abnormal or we need to change your treatment, we will call you to review the results.   Testing/Procedures: No new testing needed   Follow-Up: At [facility], you and your health needs are our priority.  As part of our continuing mission to provide you with exceptional heart care, we have created designated Provider Care Teams.  These Care Teams include your primary Cardiologist (physician) and Advanced Practice Providers (APPs -  Physician Assistants and Nurse Practitioners) who all work together to provide you with the care you need, when you need it.  . You will need a follow up appointment as needed  . Providers on your designated Care Team:   . Murray Hodgkins, NP . Christell Faith, PA-C . Marrianne Mood, PA-C  Any Other Special Instructions Will Be Listed Below (If Applicable).  For educational health videos Log in to : [email or web address] Or : [email or web address], password : [email or web address]

## 2018-09-04 NOTE — Progress Notes (Signed)
Cardiology Office Note  Date:  09/04/2018   ID:  [name], DOB 1961/03/21, MRN [id number]  PCP:  [name], DO   Chief Complaint  Patient presents with  . Other     Patient c.o Dizziness/chest discomfort  issues with medications caused by the spironolactone. Patient states she want to be off of these medications. Meds reviewed verbally with patient.    HPI:  [name] is a 58 year old woman with history of  palpitations, hypertension  Anxiety Hypoglycemia Mood disorder Insomnia Numerous medication intolerances Chronic dizziness unrelated to blood pressure, negative Holter, referred to ENT  who presents for follow-up of her Blood pressure   INTERVAL HISTORY: The patient reports today for follow up.  She complains of multiple side effects to antihypertensives over the years.  She reports taking apple cider vinegar pills, spironolactone 25 mg, and HCTZ 19.6 mg bystolic 10 mg daily. On this regiment has breast swelling with tenderness She would like to stop the spironolactone Unable to advance her HCTZ secondary to bladder spasms  Received phone call August 01, 2018, reported having low blood pressures.  Was concerned about being on 3 blood pressure pills She had recently switched from Spironolactone to eplerenone due to breast enlargement   It was recommended that she Could potentially cut eplerenone in half if frequent symptomatic low BP's.  She talked with pharmacy, Advised to resume spironolactone 12.5mg  daily, stop the eplerenone., and if her BP remains elevated above goal <130/63mmHg in 1 week would increase up to 25 mg daily  Previously did not tolerate amlodipine very well had some leg swelling but may have tolerated 2.5 mg daily tried lisinopril couple of years back, PCP discontinued because of coughing.   Other issues include  "dull intense" left arm pain in the triceps area which persisted for an unknown amount of time with accompanying  tingling in fingers.    She recently traveled to [place] and works as a Marine scientist.  Only working 2 days a week states that she sometimes has anxiety and is hoping some of her medication changes can help with that.  Trouble with her weight Endocrinology appointment next month for an HBA1C check. She is very active (walks when she can) and admits to losing over 10 pounds.  On last visit with primary care 1 month ago she requested to discontinue Zoloft Is not taking trazodone for sleep  Today's Blood pressure 142/90 Total Chol 161/ LDL 98 HBA1C 5.9 CR 0.80 Glucose 90  EKG personally reviewed by myself on today's visit Shows normal sinus rhythm. 68 bpm.    OTHER PAST MEDICAL HISTORY REVIEWED BY ME FOR TODAY'S VISIT: Previously tried Cardura with unclear side effects, possibly none Worsening swelling on amloidpine 5 mg in the past Negative orthostatics in the past  losartan and she was on this for 1 week but reported having significant side effects of general malaise  Had an episode of presyncope at work June 5    The episode occurred around 9:30-10:00 while she was getting medication out of a cabinet in the lab at her office   148/78   Pulse 64  CBG was 88    chronic dizziness, Etiology unclear Does not appear related to blood pressure  On previous ER visit her dizziness, BP 222L systolic, Orthostatic negative, Low calcium, Glucose 109  Atenolol did not help her palpitations, changed to bystolic   Myoview in 7989 for ectopy that was reportedly normal. She attributes her symptoms to menopause. Menopause symptoms have  been getting worse over the past several years. Now with sweating, occasional palpitations, malaise.   history of concussion in April 2014 and was evaluated by Dr. Manuella Ghazi at [facility].  The injury occurred when she fell out of a chair and a metal bar on chair hit her in the back of head    Seeing a headache specialist in at [place] [facility] in father.   PMH:   has  a past medical history of Chicken pox, Chronic Dizziness, Colon polyps, Concussion, Frequent headaches, GERD (gastroesophageal reflux disease), History of stress test, Hypertension, Migraines, Pre-eclampsia, Seizure disorder in pregnancy ([place]), and Seizures ([place]).  PSH:    Past Surgical History:  Procedure Laterality Date  . COLONOSCOPY    . DILATION AND CURETTAGE OF UTERUS  09/08/2014   with hystereoscopy  . POLYPECTOMY    . TONSILLECTOMY    . TUBAL LIGATION      Current Outpatient Medications  Medication Sig Dispense Refill  . APPLE CIDER VINEGAR PO Take 1 tablet by mouth 2 (two) times daily.    . Cholecalciferol (VITAMIN D-3) 1000 UNITS CAPS Take 1 capsule by mouth daily.    . fluticasone (FLONASE) 50 MCG/ACT nasal spray SHAKE LQ AND U 1 SPR IEN D  0  . MAGNESIUM MALATE PO Take 1 tablet by mouth daily.     . Misc Natural Products (APPLE CIDER VINEGAR DIET PO) Take 250 mg by mouth daily.    . Multiple Vitamins-Minerals (MULTIVITAMIN WITH MINERALS) tablet Take 1 tablet by mouth daily.    . nebivolol (BYSTOLIC) 10 MG tablet Take 1 tablet (10 mg total) by mouth daily. 90 tablet 3  . pantoprazole (PROTONIX) 40 MG tablet TAKE 1 TABLET BY MOUTH DAILY BEFORE BREAKFAST 30 tablet 0  . Specialty Vitamins Products (VITAMINS FOR THE HAIR PO) Take 1 tablet by mouth daily.    Marland Kitchen spironolactone (ALDACTONE) 25 MG tablet Take 1/2 to 1 tablet daily as directed. 30 tablet 11  . traZODone (DESYREL) 50 MG tablet Take 0.5-1 tablets (25-50 mg total) by mouth at bedtime as needed for sleep. 90 tablet 0  . hydrochlorothiazide (MICROZIDE) 12.5 MG capsule Take 1 capsule (12.5 mg total) by mouth daily. 90 capsule 3   No current facility-administered medications for this visit.      Allergies:   Losartan; Anesthetics, amide; Benadryl [diphenhydramine hcl (sleep)]; Benicar [olmesartan]; Cardura [doxazosin mesylate]; Codeine; Fentanyl; Losartan potassium-hctz; Metformin and related; and Prilocaine   Social  History:  The patient  reports that she has never smoked. She has never used smokeless tobacco. She reports current alcohol use. She reports that she does not use drugs.   Family History:   family history includes Cancer in her maternal grandfather; Colon cancer in her maternal grandfather; Diabetes in her father and mother; Heart disease in her father; Hypertension in her father and mother; Kidney disease in her father.    Review of Systems  Constitutional: Negative.        Breast tenderness  Eyes: Negative.   Respiratory: Negative for cough.   Cardiovascular: Negative.   Gastrointestinal: Negative.   Genitourinary: Negative.   Musculoskeletal: Negative.   Neurological: Negative.   Psychiatric/Behavioral: Negative.   All other systems reviewed and are negative.   PHYSICAL EXAM: VS:  BP (!) 142/90 (BP Location: Left Arm, Patient Position: Sitting, Cuff Size: Normal)   Pulse 68   Ht 5\' 4"  (1.626 m)   Wt 191 lb (86.6 kg)   LMP 12/01/2013 (Approximate)  BMI 32.79 kg/m  , BMI Body mass index is 32.79 kg/m. Constitutional:  oriented to person, place, and time. No distress.  HENT:  Head: Grossly normal Eyes:  no discharge. No scleral icterus.  Neck: No JVD, no carotid bruits  Cardiovascular: Regular rate and rhythm, no murmurs appreciated Pulmonary/Chest: Clear to auscultation bilaterally, no wheezes or rails Abdominal: Soft.  no distension.  no tenderness.  Musculoskeletal: Normal range of motion Neurological:  normal muscle tone. Coordination normal. No atrophy Skin: Skin warm and dry Psychiatric: normal affect, pleasant  Recent Labs: 10/04/2017: Magnesium 2.2 04/19/2018: ALT 20; Hemoglobin 12.4; Platelets 155; TSH 1.710 06/11/2018: BUN 21; Creatinine, Ser 0.80; Potassium 4.0; Sodium 140    Lipid Panel Lab Results  Component Value Date   CHOL 161 04/19/2018   HDL 52 04/19/2018   LDLCALC 98 04/19/2018   TRIG 54 04/19/2018      Wt Readings from Last 3 Encounters:   09/04/18 191 lb (86.6 kg)  08/07/18 190 lb (86.2 kg)  06/06/18 184 lb (83.5 kg)     ASSESSMENT AND PLAN:  Essential hypertension, benign Long discussion concerning her hypertension She would like to stop spironolactone given breast tenderness and swelling.  Did not tolerate eplerenone -Recommend she continue the low-dose HCTZ 12.5 mg daily Does not want a higher dose secondary to bladder spasms -Before adding another pill recommended she try higher dose Bystolic.  Seems somewhat reluctant but will increase up to 15 mg daily with eventual titration up to 20 mg daily -If blood pressure not well controlled could add amlodipine 2.5 which she tolerated well in the past -We did discuss retrial of Cardura/doxazosin as she did not seem to have any intolerance.  Palpitations We will increase beta-blocker as above Overall symptoms relatively well controlled  Anxiety Not sleeping well, chronic issue  chronic fatigue Cut back at work 2 days a week Would like to wean off her SSRI per the notes  Insomnia Chronic fatigue symptoms Recommended regular walking program   Dizziness Chronic issue, unrelated to blood pressure   Total encounter time more than 25 minutes  Greater than 50% was spent in counseling and coordination of care with the patient   Disposition: F/U  as needed   Orders Placed This Encounter  Procedures  . EKG 12-Lead    Heide Scales and Meryl Dare are acting as a scribe for Ida Rogue, M.D., Ph.D.  I, Ida Rogue, M.D. Ph.D., have reviewed the above documentation for accuracy and completeness, and I agree with the above.   Signed, Esmond Plants, M.D., Ph.D. 09/04/2018  [place], [place]

## 2018-09-16 ENCOUNTER — Telehealth: Payer: Self-pay | Admitting: Cardiovascular Disease

## 2018-09-16 NOTE — Telephone Encounter (Signed)
I spoke with the patient.  She saw Dr. Rockey Situ on 09/04/18 and the following recommendations were made for her BP management: - increase bystolic to 15 mg once daily in the AM - continue HCTZ - take spironolactone 12.5 mg once daily as needed for SBP > 170.   The patient confirms she has been taking her medications as indicated above. She has not taken any spironolactone. She states that for a couple of days after she stopped the spironolactone, that her SBP was around 170, but on day 3, her SBP dropped to the 120's.  She walks daily for about 1 hour in the AM prior to taking any medications. She reports feeling dizzy/ lightheaded first thing in the AM when she wakes up.  BP readings will be from 90-100/50-60. When she gets up and moves around, she will run a SBP in the 120's. She does stay hydrated with exercise.   She confirms she will typically feel a little dizzy/ lightheaded with her morning walks, but since spironolactone was stopped, she has noticed this more- "I feel like a wave comes over me." She reports 4 "waves" of dizziness/ lightheadedness/ pre-syncope that she felt this morning.   She is asking if she can cut back on the bytolic to 10 mg in the AM. HR's are running mid 50's-mid 60's.  I advised she should: - decrease bystolic to 10 mg once daily,  - stay on HCTZ 12.5 mg once daily and - take PRN spironolactone 12.5 mg for SBP > 170 as directed by Dr. Rockey Situ.  She is aware to continue to monitor her BP/ HR's and let us know on Thursday how she is doing. She is aware that I will forward to Dr. Rockey Situ to make him aware.

## 2018-09-16 NOTE — Telephone Encounter (Signed)
Pt is calling back to update on her BP medication change.  States her BP is low states she feels dizzy and like she is going to pass out. Pt does not know what her BP was when she had these episodes 116/68 This moring when she got up 114/58 after taking less medication  Pt c/o BP issue: STAT if pt c/o blurred vision, one-sided weakness or slurred speech  1. What are your last 5 BP readings? Upon awaking 114/58  After walk 116/68, before 124/65   2. Are you having any other symptoms (ex. Dizziness, headache, blurred vision, passed out)? Dizziness and feeling like she was going to pass out   3. What is your BP issue? Too low  Pt would like to talk to Dr. Rockey Situ directly.

## 2018-09-20 ENCOUNTER — Ambulatory Visit: Payer: Self-pay | Admitting: Family Medicine

## 2018-09-25 ENCOUNTER — Other Ambulatory Visit (INDEPENDENT_AMBULATORY_CARE_PROVIDER_SITE_OTHER): Payer: No Typology Code available for payment source

## 2018-09-25 ENCOUNTER — Other Ambulatory Visit: Payer: Self-pay

## 2018-09-25 DIAGNOSIS — E162 Hypoglycemia, unspecified: Secondary | ICD-10-CM

## 2018-09-25 DIAGNOSIS — R7301 Impaired fasting glucose: Secondary | ICD-10-CM

## 2018-09-27 LAB — BASIC METABOLIC PANEL
BUN/Creatinine Ratio: 24 (ref 12–28)
BUN: 16 mg/dL (ref 8–27)
CO2: 23 mmol/L (ref 20–29)
Calcium: 9.3 mg/dL (ref 8.7–10.3)
Chloride: 102 mmol/L (ref 96–106)
Creatinine, Ser: 0.67 mg/dL (ref 0.57–1.00)
GFR calc Af Amer: 110 mL/min/{1.73_m2} (ref >59)
GFR calc non Af Amer: 96 mL/min/{1.73_m2} (ref >59)
Glucose: 91 mg/dL (ref 65–99)
Potassium: 4.5 mmol/L (ref 3.5–5.2)
Sodium: 140 mmol/L (ref 134–144)

## 2018-09-27 LAB — INSULIN AND C-PEPTIDE, SERUM
C-Peptide: 2.6 ng/mL (ref 1.1–4.4)
INSULIN: 9.8 u[IU]/mL (ref 2.6–24.9)

## 2018-09-27 LAB — TSH: TSH: 1.46 u[IU]/mL (ref 0.450–4.500)

## 2018-09-27 LAB — HEMOGLOBIN A1C
Est. average glucose Bld gHb Est-mCnc: 117 mg/dL
Hgb A1c MFr Bld: 5.7 % — ABNORMAL HIGH (ref 4.8–5.6)

## 2018-10-01 ENCOUNTER — Telehealth: Payer: Self-pay | Admitting: Family Medicine

## 2018-10-01 ENCOUNTER — Telehealth: Payer: Self-pay

## 2018-10-01 NOTE — Telephone Encounter (Signed)
Called patient and informed her that Dr. Chalmers Cater needs to review and advise. MPulliam, CMA/RT(R)

## 2018-10-01 NOTE — Telephone Encounter (Signed)
Patient states that she has a round rash and small bumps on her face and would like to know what she can use OTC.  Patient states that it started after wearing a mask. Please review and advise. MPulliam, CMA/RT(R)

## 2018-10-01 NOTE — Telephone Encounter (Signed)
Patient called to request results from Blood work .   --Forwarding request to medical assistant to call [phone] with update.  --glh

## 2018-10-01 NOTE — Telephone Encounter (Signed)
Needs video office visit with me.

## 2018-10-02 ENCOUNTER — Ambulatory Visit: Payer: No Typology Code available for payment source | Admitting: Family Medicine

## 2018-10-02 ENCOUNTER — Other Ambulatory Visit: Payer: Self-pay

## 2018-10-02 NOTE — Telephone Encounter (Signed)
Called and notified patient.  Webex appt made for today at 215. MPulliam, CMA/RT(R)

## 2018-10-10 ENCOUNTER — Other Ambulatory Visit: Payer: Self-pay

## 2018-10-10 ENCOUNTER — Telehealth: Payer: Self-pay | Admitting: Cardiovascular Disease

## 2018-10-10 ENCOUNTER — Telehealth (INDEPENDENT_AMBULATORY_CARE_PROVIDER_SITE_OTHER): Payer: No Typology Code available for payment source | Admitting: Cardiovascular Disease

## 2018-10-10 DIAGNOSIS — F419 Anxiety disorder, unspecified: Secondary | ICD-10-CM | POA: Diagnosis not present

## 2018-10-10 DIAGNOSIS — R42 Dizziness and giddiness: Secondary | ICD-10-CM

## 2018-10-10 DIAGNOSIS — I10 Essential (primary) hypertension: Secondary | ICD-10-CM

## 2018-10-10 MED ORDER — AMLODIPINE BESYLATE 2.5 MG PO TABS: 2.5000 mg | ORAL_TABLET | Freq: Every day | ORAL | 3 refills | Status: DC | PRN

## 2018-10-10 MED ORDER — HYDROCHLOROTHIAZIDE 12.5 MG PO CAPS: 12.5000 mg | ORAL_CAPSULE | ORAL | 3 refills | Status: DC

## 2018-10-10 NOTE — Progress Notes (Signed)
Virtual Visit via Video Note   This visit type was conducted due to national recommendations for restrictions regarding the COVID-19 Pandemic (e.g. social distancing) in an effort to limit this patient's exposure and mitigate transmission in our community.  Due to her co-morbid illnesses, this patient is at least at moderate risk for complications without adequate follow up.  This format is felt to be most appropriate for this patient at this time.  All issues noted in this document were discussed and addressed.  A limited physical exam was performed with this format.  Please refer to the patient's chart for her consent to telehealth for [facility].   I connected with  [name] on 10/10/18 by a video enabled telemedicine application and verified that I am speaking with the correct person using two identifiers. I discussed the limitations of evaluation and management by telemedicine. The patient expressed understanding and agreed to proceed.   Evaluation Performed:  Follow-up visit  Date:  10/10/2018   ID:  Nicole Gould, DOB [birth year], MRN [id number]  Patient Location:  [address]  [place] [address]   Provider location:   [facility], [place] office  PCP:  [name], DO  Cardiologist:  Patsy Baltimore   Chief Complaint: Hypertension, dizziness    History of Present Illness:    Nicole Gould is a 58 y.o. female who presents via audio/video conferencing for a telehealth visit today.   The patient does not symptoms concerning for COVID-19 infection (fever, chills, cough, or new SHORTNESS OF BREATH).   Patient has a past medical history of palpitations, hypertension  Anxiety Hypoglycemia Mood disorder Insomnia Numerous medication intolerances Chronic dizziness unrelated to blood pressure, negative Holter, who presents for follow-up of her Blood pressure  Takes apple cider vinegar daily Stopped aldactone, still on HCTZ  Tried high dose bystolic, felt dizzy Went back to bystolic 10 daily Waited 2 weeks At first BP was great BP started to creep up 062 to 694 systolic Retried extra bystolic 5  BP no better Still 130 to 150  This AM 127/61, before meds Higher as the day went on Later 130s Pulse 60 to 70  multiple side effects to antihypertensives over the years.   Spironolactone recently caused breast tenderness Even tried eplerenone with similar circumstances Previous leg swelling with higher dose amlodipine Lisinopril caused a cough Took herself off Zoloft in the past Has previously had trazodone for sleep  Total Chol 161/ LDL 98 HBA1C 5.9 CR 0.80 Glucose 90   Prior CV studies:   The following studies were reviewed today:  Past Medical History:  Diagnosis Date  . Chicken pox   . Chronic Dizziness   . Colon polyps   . Concussion   . Frequent headaches   . GERD (gastroesophageal reflux disease)   . History of stress test    a. 2005 - in setting of ectopy - reportedly nl.  . Hypertension    a. 03/2018 24hr ABM: Mean BP 146/75, mean HR 61; Mean awake BP/HR 150/78, 62; Mean sleep BP/HR 130/65, 58.  . Migraines   . Pre-eclampsia    with grand mal seizures  . Seizure disorder in pregnancy ([place])   . Seizures ([place])    with pregnancy 1981- pre clampsia    Past Surgical History:  Procedure Laterality Date  . COLONOSCOPY    . DILATION AND CURETTAGE OF UTERUS  09/08/2014   with hystereoscopy  . POLYPECTOMY    . TONSILLECTOMY    .  TUBAL LIGATION       Current Meds  Medication Sig  . APPLE CIDER VINEGAR PO Take 1 tablet by mouth 2 (two) times daily.  . Cholecalciferol (VITAMIN D-3) 1000 UNITS CAPS Take 1 capsule by mouth daily.  . fluticasone (FLONASE) 50 MCG/ACT nasal spray SHAKE LQ AND U 1 SPR IEN D  . MAGNESIUM MALATE PO Take 1 tablet by mouth daily.   . Misc Natural Products (APPLE CIDER VINEGAR DIET PO) Take 250 mg by mouth daily.  . Multiple Vitamins-Minerals (MULTIVITAMIN  WITH MINERALS) tablet Take 1 tablet by mouth daily.  . nebivolol (BYSTOLIC) 10 MG tablet Take 1 tablet (10 mg total) by mouth daily.  . pantoprazole (PROTONIX) 40 MG tablet Take 1 tablet (40 mg total) by mouth daily. Take 30-60 minutes before breakfast.  . Specialty Vitamins Products (VITAMINS FOR THE HAIR PO) Take 1 tablet by mouth daily.  . traZODone (DESYREL) 50 MG tablet Take 0.5-1 tablets (25-50 mg total) by mouth at bedtime as needed for sleep.  . [DISCONTINUED] amLODipine (NORVASC) 2.5 MG tablet Take 1 tablet (2.5 mg total) by mouth daily.     Allergies:   Losartan; Anesthetics, amide; Benadryl [diphenhydramine hcl (sleep)]; Benicar [olmesartan]; Cardura [doxazosin mesylate]; Codeine; Fentanyl; Losartan potassium-hctz; Metformin and related; and Prilocaine   Social History   Tobacco Use  . Smoking status: Never Smoker  . Smokeless tobacco: Never Used  Substance Use Topics  . Alcohol use: Yes    Comment: very rare - glass red wine when that   . Drug use: No     Current Outpatient Medications on File Prior to Visit  Medication Sig Dispense Refill  . APPLE CIDER VINEGAR PO Take 1 tablet by mouth 2 (two) times daily.    . Cholecalciferol (VITAMIN D-3) 1000 UNITS CAPS Take 1 capsule by mouth daily.    . fluticasone (FLONASE) 50 MCG/ACT nasal spray SHAKE LQ AND U 1 SPR IEN D  0  . MAGNESIUM MALATE PO Take 1 tablet by mouth daily.     . Misc Natural Products (APPLE CIDER VINEGAR DIET PO) Take 250 mg by mouth daily.    . Multiple Vitamins-Minerals (MULTIVITAMIN WITH MINERALS) tablet Take 1 tablet by mouth daily.    . nebivolol (BYSTOLIC) 10 MG tablet Take 1 tablet (10 mg total) by mouth daily. 90 tablet 3  . pantoprazole (PROTONIX) 40 MG tablet Take 1 tablet (40 mg total) by mouth daily. Take 30-60 minutes before breakfast. 90 tablet 0  . Specialty Vitamins Products (VITAMINS FOR THE HAIR PO) Take 1 tablet by mouth daily.    . traZODone (DESYREL) 50 MG tablet Take 0.5-1 tablets (25-50  mg total) by mouth at bedtime as needed for sleep. 90 tablet 0  . spironolactone (ALDACTONE) 25 MG tablet Take 1/2 to 1 tablet daily as directed. (Patient not taking: Reported on 10/10/2018) 30 tablet 11   No current facility-administered medications on file prior to visit.      Family Hx: The patient's family history includes Cancer in her maternal grandfather; Colon cancer in her maternal grandfather; Diabetes in her father and mother; Heart disease in her father; Hypertension in her father and mother; Kidney disease in her father. There is no history of Colon polyps or Breast cancer.  ROS:   Please see the history of present illness.    Review of Systems  Constitutional: Negative.   Respiratory: Negative.   Cardiovascular: Negative.   Gastrointestinal: Negative.   Musculoskeletal: Negative.   Neurological: Positive for  dizziness.  Psychiatric/Behavioral: Negative.   All other systems reviewed and are negative.     Labs/Other Tests and Data Reviewed:    Recent Labs: 04/19/2018: ALT 20; Hemoglobin 12.4; Platelets 155 09/25/2018: BUN 16; Creatinine, Ser 0.67; Potassium 4.5; Sodium 140; TSH 1.460   Recent Lipid Panel Lab Results  Component Value Date/Time   CHOL 161 04/19/2018 08:53 AM   TRIG 54 04/19/2018 08:53 AM   HDL 52 04/19/2018 08:53 AM   CHOLHDL 3.1 04/19/2018 08:53 AM   CHOLHDL 3 08/27/2015 08:06 AM   LDLCALC 98 04/19/2018 08:53 AM   LDLDIRECT 113.0 09/16/2014 12:08 PM    Wt Readings from Last 3 Encounters:  09/04/18 191 lb (86.6 kg)  08/07/18 190 lb (86.2 kg)  06/06/18 184 lb (83.5 kg)     Exam:    Vital Signs: Vital signs may also be detailed in the HPI LMP 12/01/2013 (Approximate)   Wt Readings from Last 3 Encounters:  09/04/18 191 lb (86.6 kg)  08/07/18 190 lb (86.2 kg)  06/06/18 184 lb (83.5 kg)   Temp Readings from Last 3 Encounters:  08/07/18 98 F (36.7 C)  12/10/17 98.5 F (36.9 C) (Oral)  11/02/17 97.9 F (36.6 C) (Oral)   BP Readings from  Last 3 Encounters:  09/04/18 (!) 142/90  08/07/18 117/74  06/27/18 116/82   Pulse Readings from Last 3 Encounters:  09/04/18 68  08/07/18 65  06/27/18 (!) 78     Well nourished, well developed female in no acute distress. Constitutional:  oriented to person, place, and time. No distress.  Head: Normocephalic and atraumatic.  Eyes:  no discharge. No scleral icterus.  Neck: Normal range of motion. Neck supple.  Pulmonary/Chest: No audible wheezing, no distress, appears comfortable Musculoskeletal: Normal range of motion.  no  tenderness or deformity.  Neurological:   Coordination normal. Full exam not performed Skin:  No rash Psychiatric:  normal mood and affect. behavior is normal. Thought content normal.    ASSESSMENT & PLAN:    Essential hypertension, benign Long discussion concerning her medications  Does not feel that she can tolerate higher dose bystolic Recommend she stay on HCTZ 12.5 daily with bystolic 10 mg daily For high blood pressure she could take amlodipine 2.5 mg as needed or extra HCTZ 12.5 mg as needed We will not introduce any other medications Exacerbations of blood pressure typically secondary to anxiety/stress  anxiety Managed by primary care  Dizziness Seems to have dizziness when her blood pressure gets low With that in mind recommended we aim for blood pressure 130 up to 150s   COVID-19 Education: The signs and symptoms of COVID-19 were discussed with the patient and how to seek care for testing (follow up with PCP or arrange E-visit).  The importance of social distancing was discussed today.  Patient Risk:   After full review of this patients clinical status, I feel that they are at least moderate risk at this time.  Time:   Today, I have spent 25 minutes with the patient with telehealth technology discussing the cardiac and medical problems/diagnoses detailed above   10 min spent reviewing the chart prior to patient visit today   Medication  Adjustments/Labs and Tests Ordered: Current medicines are reviewed at length with the patient today.  Concerns regarding medicines are outlined above.   Tests Ordered: No tests ordered   Medication Changes: No changes made   Disposition: Follow-up as needed   Signed, Ida Rogue, MD  10/10/2018 5:38 PM  [place] Office [id number], [place], [place] [address]

## 2018-10-10 NOTE — Progress Notes (Signed)
Called and spoke with patient to review medications prescribed and to see if she had any further questions. She verbalized understanding with no further questions at this time.

## 2018-10-10 NOTE — Telephone Encounter (Signed)
Pt c/o medication issue:  1. Name of Medication: Bystolic   2. How are you currently taking this medication (dosage and times per day)? 15 Mg 1 tablet daily   3. Are you having a reaction (difficulty breathing--STAT)? Dizzy, woozy, not decreasing BP  4. What is your medication issue? Patient calling, was increased to 15 MG of Bystolic recently to help BP.  Patient states it is not making a difference at all, makes her feel dizzy and woozy, which the 10 MG did not.  Patient would like to know if she can just go back to taking 10 MG or does she need to slowly make the transition.  Please call to discuss.

## 2018-10-10 NOTE — Patient Instructions (Addendum)
Medication Instructions:  Your physician has recommended you make the following change in your medication:  1. CONTINUE Bystolic 10 mg once daily 2. CONTINUE Hydrochlorothiazide 12.5 mg once daily 3. AS NEEDED Take Amlodipine 2.5 mg daily as needed for SBP>150 4. AS NEEDED Extra Hydrochlorothiazide 12.5 mg as needed for SBP>150   If you need a refill on your cardiac medications before your next appointment, please call your pharmacy.    Lab work: No new labs needed   If you have labs (blood work) drawn today and your tests are completely normal, you will receive your results only by: Marland Kitchen MyChart Message (if you have MyChart) OR . A paper copy in the mail If you have any lab test that is abnormal or we need to change your treatment, we will call you to review the results.   Testing/Procedures: No new testing needed   Follow-Up: At [facility], you and your health needs are our priority.  As part of our continuing mission to provide you with exceptional heart care, we have created designated Provider Care Teams.  These Care Teams include your primary Cardiologist (physician) and Advanced Practice Providers (APPs -  Physician Assistants and Nurse Practitioners) who all work together to provide you with the care you need, when you need it.  . You will need a follow up appointment As needed .   Please call our office 2 months in advance to schedule this appointment.    . Providers on your designated Care Team:   . Murray Hodgkins, NP . Christell Faith, PA-C . Marrianne Mood, PA-C  Any Other Special Instructions Will Be Listed Below (If Applicable).  For educational health videos Log in to : [email or web address] Or : [email or web address], password : [email or web address]

## 2018-10-10 NOTE — Telephone Encounter (Signed)
Dr Rockey Situ had opening today at 3 pm. Patient added to schedule. See below.       Virtual Visit Pre-Appointment Phone Call  "(Name), I am calling you today to discuss your upcoming appointment. We are currently trying to limit exposure to the virus that causes COVID-19 by seeing patients at home rather than in the office."  1. "What is the BEST phone number to call the day of the visit?" - [phone]  2. Do you have or have access to (through a family member/friend) a smartphone with video capability that we can use for your visit?" a. If yes - list this number in appt notes as cell (if different from BEST phone #) and list the appointment type as a VIDEO visit in appointment notes b. If no - list the appointment type as a PHONE visit in appointment notes  3. Confirm consent - "In the setting of the current Covid19 crisis, you are scheduled for a (phone or video) visit with your provider on (date) at (time).  Just as we do with many in-office visits, in order for you to participate in this visit, we must obtain consent.  If you'd like, I can send this to your mychart (if signed up) or email for you to review.  Otherwise, I can obtain your verbal consent now.  All virtual visits are billed to your insurance company just like a normal visit would be.  By agreeing to a virtual visit, we'd like you to understand that the technology does not allow for your provider to perform an examination, and thus may limit your provider's ability to fully assess your condition. If your provider identifies any concerns that need to be evaluated in person, we will make arrangements to do so.  Finally, though the technology is pretty good, we cannot assure that it will always work on either your or our end, and in the setting of a video visit, we may have to convert it to a phone-only visit.  In either situation, we cannot ensure that we have a secure connection.  Are you willing to proceed?" STAFF: Did the patient  verbally acknowledge consent to telehealth visit? Document YES/NO here: yes  4. Advise patient to be prepared - "Two hours prior to your appointment, go ahead and check your blood pressure, pulse, oxygen saturation, and your weight (if you have the equipment to check those) and write them all down. When your visit starts, your provider will ask you for this information. If you have an Apple Watch or Kardia device, please plan to have heart rate information ready on the day of your appointment. Please have a pen and paper handy nearby the day of the visit as well."  5. Give patient instructions [email or web address] as below if video visit (depending on what platform provider is using)  6. Inform patient they will receive a phone call 15 minutes prior to their appointment time (may be from unknown caller ID) so they should be prepared to answer    TELEPHONE CALL NOTE  Symiah Nowotny has been deemed a candidate for a follow-up tele-health visit to limit community exposure during the Covid-19 pandemic. I spoke with the patient via phone to ensure availability of phone/video source, confirm preferred email & phone number, and discuss instructions and expectations.  I reminded Sadiyah Kangas to be prepared with any vital sign and/or heart rhythm information that could potentially be obtained via home monitoring, at the time of her visit. I reminded Adasyn Mcadams to expect  a phone call prior to her visit.  Roney Jaffe, RN 10/10/2018 1:10 PM   IF USING DOXIMITY or [email or web address].ME - The patient will receive a link just prior to their visit by text.

## 2018-10-23 ENCOUNTER — Ambulatory Visit: Payer: No Typology Code available for payment source | Admitting: Family Medicine

## 2018-11-12 LAB — HM PAP SMEAR: HM Pap smear: NEGATIVE

## 2018-11-22 ENCOUNTER — Telehealth: Payer: Self-pay | Admitting: Internal Medicine

## 2018-11-22 NOTE — Telephone Encounter (Signed)
Patient states she has been taking medication pantoprazole for years and hasn't seen many results. Patient is wanting to know why she has problems getting refills and Pt states she read this medication is only suppose to be taken for 5 years due to making bones brittle and she thinks she has been taking this for about 6. Patient is concerned she is not being properly cared for and would like to discuss further with a nurse.

## 2018-11-22 NOTE — Telephone Encounter (Signed)
Left message to call back  

## 2018-11-22 NOTE — Telephone Encounter (Signed)
Spoke with pt and let her know her refills stopped in 2019 due to not being seen in a year. She had to continue to call to get refills. Pt has now scheduled follow-up. Pt also concerned as pharmacy told her she should not take med for more than 5 years, concerned about osteoporosis. Discussed with pt that pts stay on PPI meds for longer than 5 years. Pt wanted to know when due for next colon, she was given recall date. Pt also wanted to know when due for next EGD, discussed with her then was not a recall date for the EGD. Pt states that the protonix is really not holding her, reports she is having to take mustard to help. Pt wanted to know what she could do until her appt. Discussed with her she could try tums also pepcid 20mg  at bedtime. Pt verbalized understanding.

## 2018-11-26 ENCOUNTER — Other Ambulatory Visit: Payer: Self-pay

## 2018-11-26 MED ORDER — NEBIVOLOL HCL 10 MG PO TABS: 10.0000 mg | ORAL_TABLET | Freq: Every day | ORAL | 3 refills | Status: DC

## 2018-12-11 ENCOUNTER — Other Ambulatory Visit: Payer: Self-pay

## 2018-12-11 ENCOUNTER — Ambulatory Visit: Payer: No Typology Code available for payment source | Admitting: Family Medicine

## 2018-12-19 ENCOUNTER — Encounter: Payer: Self-pay | Admitting: *Deleted

## 2018-12-19 ENCOUNTER — Encounter: Payer: Self-pay | Admitting: Internal Medicine

## 2018-12-19 ENCOUNTER — Ambulatory Visit (INDEPENDENT_AMBULATORY_CARE_PROVIDER_SITE_OTHER): Payer: No Typology Code available for payment source | Admitting: Internal Medicine

## 2018-12-19 VITALS — Ht 64.0 in | Wt 192.0 lb

## 2018-12-19 DIAGNOSIS — K219 Gastro-esophageal reflux disease without esophagitis: Secondary | ICD-10-CM

## 2018-12-19 DIAGNOSIS — Z86010 Personal history of colonic polyps: Secondary | ICD-10-CM

## 2018-12-19 MED ORDER — DEXILANT 60 MG PO CPDR: 60.0000 mg | DELAYED_RELEASE_CAPSULE | Freq: Every day | ORAL | 1 refills | Status: DC

## 2018-12-19 NOTE — Addendum Note (Signed)
Addended by: Larina Bras on: 12/19/2018 02:51 PM   Modules accepted: Orders

## 2018-12-19 NOTE — Patient Instructions (Signed)
Discontinue pantoprazole  We have sent the following medications to your pharmacy for you to pick up at your convenience: Dexilant 60 mg daily (in place of pantoprazole)  Continue GERD diet and antireflux precautions.  If you are having any breakthrough heartburn symptoms with the change to Dexilant, you should notify my office.   You will be due for a recall colonoscopy in 08/2021. We will send you a reminder in the mail when it gets closer to that time.  Please follow-up with Dr Hilarie Fredrickson in 1 year, or sooner if GERD symptoms persist despite change in therapy.

## 2018-12-19 NOTE — Progress Notes (Signed)
Subjective:    Patient ID: Nicole Gould, female    DOB: 1961-01-07, 58 y.o.   MRN: [id number]  This service was provided via telemedicine.  Doximity app with A/V communication The patient was located at home The provider was located in provider's GI office. The patient did consent to this telephone visit and is aware of possible charges through their insurance for this visit.   The persons participating in this telemedicine service were the patient and I. Time spent on call: 27 minutes   HPI Nicole Gould is a 58 year old female with a past medical history of GERD, adenomatous and sessile serrated colon polyps who is seen for follow-up.  She was last seen in the office in September 2018 by Ellouise Newer, PA-C.  She reports that over the last few months she has been having increasing burning in her chest/heartburn despite her pantoprazole 40 mg daily.  She was eating mustard to help with her acid indigestion and she also tried Tums.  After speaking to Rosanne Sack, RN here while waiting on this appointment she has tried some Pepcid over-the-counter which does help her symptoms.  She is not having dysphagia or odynophagia.  She had a dilation performed in 2014 of her esophagus and since then no dysphagia.  No abdominal pain.  No nausea or vomiting.  Bowel habits are regular without blood or melena.  Since being diagnosed with colon polyps in 2014 she cut out all red meat and reduce processed meats.  She had a colonoscopy without adenomatous or sessile serrated polyp in March 2018.  Endoscopy was performed on 05/21/2013.  There were circumferential folds and longitudinal markings raising question of EOE but biopsies proved GERD inflammation only.  Empiric dilation with Savary 17 mm was performed.  There was mild gastritis and mild bulbar duodenitis but gastric biopsies were benign without inflammation or H. Pylori.  Last colonoscopy March 2018 a 3 mm rectal polyp was removed but not  adenomatous or sessile serrated polyp.  5-year interval recommended.   Review of Systems As per HPI, otherwise negative  Current Medications, Allergies, Past Medical History, Past Surgical History, Family History and Social History were reviewed in [employer] record.      Objective:   Physical Exam Ht 5\' 4"  (1.626 m)   Wt 192 lb (87.1 kg)   LMP 12/01/2013 (Approximate)   BMI 32.96 kg/m  No physical exam, virtual visit  CBC    Component Value Date/Time   WBC 2.8 (L) 04/19/2018 0853   WBC 3.1 (L) 10/04/2017 1208   RBC 4.30 04/19/2018 0853   RBC 4.31 10/04/2017 1208   HGB 12.4 04/19/2018 0853   HCT 38.3 04/19/2018 0853   PLT 155 04/19/2018 0853   MCV 89 04/19/2018 0853   MCV 92 09/08/2014 2059   MCH 28.8 04/19/2018 0853   MCH 30.2 10/04/2017 1208   MCHC 32.4 04/19/2018 0853   MCHC 33.3 10/04/2017 1208   RDW 14.1 04/19/2018 0853   RDW 14.3 09/08/2014 2059   LYMPHSABS 1.2 04/19/2018 0853   MONOABS 0.2 10/04/2017 1208   EOSABS 0.0 04/19/2018 0853   BASOSABS 0.0 04/19/2018 0853   CMP     Component Value Date/Time   NA 140 09/25/2018 1116   NA 143 09/08/2014 2059   K 4.5 09/25/2018 1116   K 3.8 09/08/2014 2059   CL 102 09/25/2018 1116   CL 108 09/08/2014 2059   CO2 23 09/25/2018 1116   CO2 26 09/08/2014 2059  GLUCOSE 91 09/25/2018 1116   GLUCOSE 109 (H) 10/04/2017 1229   GLUCOSE 94 09/08/2014 2059   BUN 16 09/25/2018 1116   BUN 14 09/08/2014 2059   CREATININE 0.67 09/25/2018 1116   CREATININE 0.72 09/08/2014 2059   CALCIUM 9.3 09/25/2018 1116   CALCIUM 9.4 09/08/2014 2059   PROT 6.7 04/19/2018 0853   PROT 7.1 09/08/2014 2059   ALBUMIN 3.9 04/19/2018 0853   ALBUMIN 4.1 09/08/2014 2059   AST 22 04/19/2018 0853   AST 34 09/08/2014 2059   ALT 20 04/19/2018 0853   ALT 45 09/08/2014 2059   ALKPHOS 42 04/19/2018 0853   ALKPHOS 43 09/08/2014 2059   BILITOT 0.7 04/19/2018 0853   BILITOT 0.5 09/08/2014 2059   GFRNONAA 96 09/25/2018 1116    GFRNONAA >60 09/08/2014 2059   GFRAA 110 09/25/2018 1116   GFRAA >60 09/08/2014 2059          Assessment & Plan:  58 year old female with a past medical history of GERD, adenomatous and sessile serrated colon polyps who is seen for follow-up.   1.  GERD --she has had reflux for many years but specifically worse over the last 6 or 7 years.  She has not had any new alarm symptoms such as dysphagia.  Pantoprazole 40 mg once daily was previously working well but she is now having some breakthrough heartburn in the day and at night.  We spent considerable time today discussing her reflux disease, chronic PPI and the risks thereof.  I advised that she work with primary care and follow her bone density regularly and consider treatment for osteopenia/osteoporosis should this be needed.  We discussed the option of increasing pantoprazole to twice daily, adding an H2 blocker at night on a regular basis or changing PPI altogether.  After this discussion we decided to do the following: --Discontinue pantoprazole --Begin Dexilant 60 mg daily --Continue GERD diet and antireflux precautions --She is advised that if she is having any breakthrough heartburn symptoms with change to Dexilant that she should notify me.  She voices understanding.  If this is the case I recommend we repeat upper endoscopy (clinical suspicion for EOE at last endoscopy but biopsies showed GERD only)  2.  History of adenomatous and sessile serrated polyps --we spent time today discussing her prior polyps.  She has avoided red meat and processed meats which data suggest may increase the risk of colon polyps.  Her next screening interval would be March 2023 --Surveillance colonoscopy March 2023  Annual follow-up but sooner if GERD symptoms persist despite change in therapy

## 2018-12-23 ENCOUNTER — Telehealth: Payer: Self-pay | Admitting: *Deleted

## 2018-12-23 NOTE — Telephone Encounter (Signed)
[facility] has approved Dexilant 60 mg from 12/23/18-12/22/20. PA# Danielle Rankin Option [id number]

## 2018-12-24 ENCOUNTER — Other Ambulatory Visit: Payer: Self-pay | Admitting: Internal Medicine

## 2018-12-24 ENCOUNTER — Other Ambulatory Visit: Payer: Self-pay | Admitting: Physician Assistant

## 2018-12-25 ENCOUNTER — Telehealth: Payer: Self-pay | Admitting: Internal Medicine

## 2018-12-25 ENCOUNTER — Telehealth: Payer: Self-pay | Admitting: Cardiovascular Disease

## 2018-12-25 NOTE — Telephone Encounter (Signed)
Patient called in stating that she can not afford the new medication and wants to go back to her older prescription pantoprazole. She stated when she asked the pharmacy to fill the old and not the new she was advised that the doctor denied it. She is needing the refill asap because she do not have any and is going out of town.

## 2018-12-25 NOTE — Telephone Encounter (Signed)
Spoke with patient and reviewed that savings card can be found online at [email or web address]. She also reviewed previous instructions on medications and we discussed that as well. She was appreciative for the call with no further questions at this time.

## 2018-12-25 NOTE — Telephone Encounter (Signed)
Patient calling  Patient is out of Bystolic medication and it is very expensive  Would like a coupon for medication  Please call to discuss

## 2018-12-26 MED ORDER — PANTOPRAZOLE SODIUM 40 MG PO TBEC: 40.0000 mg | DELAYED_RELEASE_TABLET | ORAL | 1 refills | Status: DC

## 2018-12-26 NOTE — Telephone Encounter (Signed)
Patient indicates that [place] script is too expensive even with insurance approval ($70.20 monthly). Therefore, she would like to instead try pantoprazole every morning and pepcid every evening on a regular basis. This was one of the options given to her at her office visit recently. I have sent pantoprazole script to pharmacy and discontinued Dexilant script. Patient verbalizes understanding.

## 2019-01-07 ENCOUNTER — Telehealth: Payer: Self-pay | Admitting: Internal Medicine

## 2019-01-07 DIAGNOSIS — K625 Hemorrhage of anus and rectum: Secondary | ICD-10-CM

## 2019-01-07 NOTE — Telephone Encounter (Signed)
Patient called said that she had mucus with blood in her stool today and would like to speak to someone

## 2019-01-07 NOTE — Telephone Encounter (Signed)
Spoke with pt and she states that today she had a hard pain in her stomach and there was dark blood in the stool. Last week she states there was mucous on the tissue and streaks of BRB on the tissue. States she recently started taking metamucil and a B supplement powder that is burgundy in color, that is the only change. Pt wants to know what she should do. Please advise.

## 2019-01-07 NOTE — Telephone Encounter (Signed)
She can continue the Metamucil, would stop the B supplement powder for now just to observe stool color Would have her come for a CBC to check blood counts Monitor output going forward and let me know if she has further rectal bleeding

## 2019-01-07 NOTE — Telephone Encounter (Signed)
Detailed message left for pt, order in epic for lab.

## 2019-01-08 ENCOUNTER — Telehealth: Payer: Self-pay | Admitting: Internal Medicine

## 2019-01-08 NOTE — Telephone Encounter (Signed)
Left message for pt to call back  °

## 2019-01-08 NOTE — Telephone Encounter (Signed)
Pt reported that she thinks she may know the cause of blood in her stool and would like to discuss.

## 2019-01-09 NOTE — Telephone Encounter (Signed)
See additional phone note. 

## 2019-01-09 NOTE — Telephone Encounter (Signed)
Pt states the beet b12 was causing her stool to be the burgundy red color. Pt states she will stop drinking this supplement.

## 2019-01-09 NOTE — Telephone Encounter (Signed)
Patient returned phone call. °

## 2019-01-09 NOTE — Telephone Encounter (Signed)
Left message for pt to call back  °

## 2019-01-13 ENCOUNTER — Telehealth: Payer: Self-pay | Admitting: Family Medicine

## 2019-01-13 NOTE — Telephone Encounter (Signed)
Patient called states she has been experiencing some dizziness 1st thing in morning when getting up & after exercising (walking)--- Patient has seen ENT & cardiologist about symptoms & BP but they have cleared her for non related cardiac issues & Vertigo but she still  Dizziness.   ---Pt suggested she may need some labs--advised her that I would send message to medical assistant to review labs & advise if OV/TELEHEALTH necessary & that someone would call her @ [phone].   --glh

## 2019-01-14 NOTE — Telephone Encounter (Signed)
Called and left message to call the office back. MPulliam, CMA/RT(R)  

## 2019-01-15 NOTE — Telephone Encounter (Signed)
Spoke to patient she states that she feels dizzy after walking and feels that it maybe her BP dropping to fast.  Patient states that she has been having this for over a year and denies any other symptoms including SOB, chest pain, confusion, etc.  Patient has a cardiologist and advised patient to contact their office as if this is related to BP their office will need to adjust any medication if needed. MPulliam, CMA/RT(R)

## 2019-01-16 ENCOUNTER — Telehealth: Payer: Self-pay | Admitting: Cardiovascular Disease

## 2019-01-16 NOTE — Telephone Encounter (Signed)
Pt c/o BP issue: STAT if pt c/o blurred vision, one-sided weakness or slurred speech  1. What are your last 5 BP readings?  7/27 154/78- woke up  113/70-after walk  2. Are you having any other symptoms (ex. Dizziness, headache, blurred vision, passed out)? Dizzy/floating feeling while walking, occassional light headed  3. What is your BP issue? Patient still having issues with bp dropping while walking (ongoing for about a year now on and off) Patient feels that her BP fluctuates too quickly. Throughout the day patient feels off balance but has been seen by ENT to rule out vertigo and Endocrinologist to rule out blood sugar issues.   Please advise

## 2019-01-16 NOTE — Telephone Encounter (Signed)
No answer. Left message to call back.   

## 2019-01-17 NOTE — Telephone Encounter (Signed)
Spoke with patient and she states that she is still having some occasional episodes of floating and feeling unsteady. Blood pressure was elevated prior to morning medications. Patient feels that her blood pressures are dropping when she is walking and feels foggy with wave of floating with some lightheadedness. She reports that something is just not agreeing with her body and she feels something is going on and does not like the way she is feeling. She is wanting to figure out what is causing her these symptoms.  She did see ENT regarding vertigo and that was ruled out. PCP stated they did not want to manage blood pressures since we are doing that. Reviewed that morning pressures will be slightly elevated prior to medications and best time to check is after medications have been taken.  Confirmed that blood pressure can fluctuate during activity but she did want to see if Dr. Rockey Situ had any recommendations, solutions, or ideas on how to help her. Reviewed that he would be out next week but that I would send this to him for review. Advised that if her symptoms persist or worsen to please call back if she has not heard from [place]. She was appreciative for the call with no further questions at this time.

## 2019-01-20 NOTE — Telephone Encounter (Signed)
Attempted to call patient. LMTCB 01/20/2019   

## 2019-01-20 NOTE — Telephone Encounter (Signed)
May be best to eat breakfast, go for walk, then take AM pills

## 2019-01-24 NOTE — Telephone Encounter (Signed)
Returned call to patient. Discussed POC from Dr. Rockey Situ. She reported sh would be glad to try this.   Advised pt to call for any further questions or concerns.

## 2019-02-14 ENCOUNTER — Other Ambulatory Visit: Payer: Self-pay | Admitting: Cardiovascular Disease

## 2019-02-17 ENCOUNTER — Telehealth: Payer: Self-pay | Admitting: Internal Medicine

## 2019-02-17 NOTE — Telephone Encounter (Signed)
Spoke with pt and let her know the recall is for 2023 and that is 5 years out from her colon done in 2018. Pt verbalized understanding.

## 2019-02-17 NOTE — Telephone Encounter (Signed)
Pt would like to discuss new surveillance colon recommendation -- stated that she had pre-cancerous polyps found in previous colon and does not want to wait 8 years for next colon.

## 2019-02-18 ENCOUNTER — Telehealth: Payer: Self-pay | Admitting: Internal Medicine

## 2019-02-18 NOTE — Telephone Encounter (Signed)
First, I am unable to give any information to any outside sources without first getting consent from the patient.   Second, I dont see where the patient has had Moviprep nor has she had a colonoscopy with [place] since 2018.

## 2019-02-24 NOTE — Progress Notes (Signed)
Cardiology Office Note    Date:  02/28/2019   ID:  [name], DOB 1960/11/29, MRN [id number]  PCP:  [name], DO  Cardiologist:  Ida Rogue, MD  Electrophysiologist:  None   Chief Complaint: Follow up  History of Present Illness:   Nicole Gould is a 58 y.o. female with history of difficult to control hypertension secondary to multiple medication intolerances, chronic dizziness, headache disorder, palpitations, anxiety, and GERD who presents for follow-up of hypertension.  Prior ambulatory blood pressure monitoring in 03/2018 showed an average blood pressure of 146/75 with an average heart rate of 61 bpm.  Average blood pressure during waking hours was 150/78.  Documented medication intolerances include: Cardura - dizziness Amlodipine - lower extremity swelling; headache HCTZ - hypokalemia and dry mouth Losartan - dizziness and shortness of breath; malaise Lisinopril - cough Benicar - lightheadedness and presyncope Spironolactone - cause breast fullness Eplerenone - breast tenderness   She has been followed by our pharmacists with regards to her hypertension as well.  She was most recently evaluated by Dr. Rockey Situ virtually in 09/2018 and was taking apple cider vinegar daily.  She had stopped Aldactone.  She was taking HCTZ and Bystolic with noted dizziness on 10 mg of Bystolic.  Blood pressures appeared to range from the Q000111Q to Q000111Q systolic with heart rates in the 60s to 70s bpm.  It was recommended the patient take HCTZ 12.5 mg daily and Bystolic 10 mg daily.  For high blood pressure, she could take amlodipine 2.5 mg as needed or an extra 12.5 mg of HCTZ.  She contacted our office in late 12/2018 noting continued fluctuations in her blood pressure.  It was advised that she eat breakfast prior to taking her morning walk followed by then taking her morning medications.  She comes in doing reasonably well from a cardiac perspective.  Blood pressures overall at  home have been well controlled ranging in the Q000111Q to Q000111Q systolic.  She did have an issue with refilling her HCTZ as this was filled with capsules.  When patient started taking the capsules she began to notice more voiding and cramping than it typically associated with the tablets.  In this setting, she discontinued the capsules.  She did have a few leftover tablets of HCTZ and will take these as needed for blood pressure greater than 150.  With this, she noticed her usual voiding and no further cramping.  She continues to walk on a daily basis without any symptoms concerning for angina.  She notes anxiety continues to play significant role for her.  She has not needed any as needed amlodipine.  Overall she is doing well.   Labs: 09/2018 - A1c 5.7, serum creatinine 0.67, potassium 4.5, TSH normal 04/2018 - albumin 3.9, AST/ALT normal, total cholesterol 161, triglyceride 54, HDL 52, LDL 98, WBC 2.8, Hgb 12.4, PLT 155   Past Medical History:  Diagnosis Date   Chicken pox    Chronic Dizziness    Colon polyps    Concussion    Frequent headaches    GERD (gastroesophageal reflux disease)    History of stress test    a. 2005 - in setting of ectopy - reportedly nl.   Hypertension    a. 03/2018 24hr ABM: Mean BP 146/75, mean HR 61; Mean awake BP/HR 150/78, 62; Mean sleep BP/HR 130/65, 58.   Migraines    Pre-eclampsia    with grand mal seizures   Seizure disorder in pregnancy ([place])  Seizures ([place])    with pregnancy 1981- pre clampsia     Past Surgical History:  Procedure Laterality Date   COLONOSCOPY     DILATION AND CURETTAGE OF UTERUS  09/08/2014   with hystereoscopy   POLYPECTOMY     TONSILLECTOMY     TUBAL LIGATION      Current Medications: Current Meds  Medication Sig   AMBULATORY NON FORMULARY MEDICATION Medication Name: Human Super Beats- 1 teaspoon of Powder prn   BYSTOLIC 10 MG tablet TAKE 1 TABLET BY MOUTH EVERY DAY   Cholecalciferol (VITAMIN D-3)  1000 UNITS CAPS Take 1 capsule by mouth daily.   fluticasone (FLONASE) 50 MCG/ACT nasal spray Place 1 spray into both nostrils as needed.    hydrochlorothiazide (MICROZIDE) 12.5 MG capsule Take 1 capsule (12.5 mg total) by mouth as directed. Once daily and extra pill as needed for SBP>150 (Patient taking differently: Take 12.5 mg by mouth daily as needed. Once daily and extra pill as needed for SBP>150)   MAGNESIUM MALATE PO Take 1 tablet by mouth daily.    Misc Natural Products (APPLE CIDER VINEGAR DIET PO) [place] Gummies 2 gummies TID.   Multiple Vitamins-Minerals (MULTIVITAMIN WITH MINERALS) tablet Take 1 tablet by mouth daily.   Multiple Vitamins-Minerals (ZINC PO) Take by mouth as needed.   pantoprazole (PROTONIX) 40 MG tablet Please specify directions, refills and quantity   Specialty Vitamins Products (VITAMINS FOR THE HAIR PO) Take 1 tablet by mouth daily.   sucralfate (CARAFATE) 1 g tablet Take 1 tablet (1 g total) by mouth 4 (four) times daily -  with meals and at bedtime. Dissolve 1 tablet into 10 ml warm water to make into slurry    Allergies:   Losartan; Anesthetics, amide; Benadryl [diphenhydramine hcl (sleep)]; Benicar [olmesartan]; Cardura [doxazosin mesylate]; Codeine; Fentanyl; Losartan potassium-hctz; Metformin and related; and Prilocaine   Social History   Socioeconomic History   Marital status: Married    Spouse name: Not on file   Number of children: Not on file   Years of education: Not on file   Highest education level: Not on file  Occupational History   Not on file  Social Needs   Financial resource strain: Not on file   Food insecurity    Worry: Not on file    Inability: Not on file   Transportation needs    Medical: Not on file    Non-medical: Not on file  Tobacco Use   Smoking status: Never Smoker   Smokeless tobacco: Never Used  Substance and Sexual Activity   Alcohol use: Yes    Comment: very rare - glass red wine when that     Drug use: No   Sexual activity: Yes    Partners: Male  Lifestyle   Physical activity    Days per week: Not on file    Minutes per session: Not on file   Stress: Not on file  Relationships   Social connections    Talks on phone: Not on file    Gets together: Not on file    Attends religious service: Not on file    Active member of club or organization: Not on file    Attends meetings of clubs or organizations: Not on file    Relationship status: Not on file  Other Topics Concern   Not on file  Social History Narrative   Not on file     Family History:  The patient's family history includes Cancer in her maternal  grandfather; Colon cancer in her maternal grandfather; Diabetes in her father and mother; Heart disease in her father; Hypertension in her father and mother; Kidney disease in her father. There is no history of Colon polyps or Breast cancer.  ROS:   Review of Systems  Constitutional: Negative for chills, diaphoresis, fever, malaise/fatigue and weight loss.  HENT: Negative for congestion.   Eyes: Negative for discharge and redness.  Respiratory: Negative for cough, hemoptysis, sputum production, shortness of breath and wheezing.   Cardiovascular: Negative for chest pain, palpitations, orthopnea, claudication, leg swelling and PND.  Gastrointestinal: Negative for abdominal pain, blood in stool, heartburn, melena, nausea and vomiting.  Genitourinary: Negative for hematuria.  Musculoskeletal: Negative for falls and myalgias.  Skin: Negative for rash.  Neurological: Negative for dizziness, tingling, tremors, sensory change, speech change, focal weakness, loss of consciousness and weakness.  Endo/Heme/Allergies: Does not bruise/bleed easily.  Psychiatric/Behavioral: Negative for substance abuse. The patient is nervous/anxious.   All other systems reviewed and are negative.    EKGs/Labs/Other Studies Reviewed:    Studies reviewed were summarized above. The  additional studies were reviewed today: As above  EKG:  EKG is ordered today.  The EKG ordered today demonstrates sinus bradycardia, 59 bpm, no acute ST-T changes  Recent Labs: 04/19/2018: ALT 20; Hemoglobin 12.4; Platelets 155 09/25/2018: BUN 16; Creatinine, Ser 0.67; Potassium 4.5; Sodium 140; TSH 1.460  Recent Lipid Panel    Component Value Date/Time   CHOL 161 04/19/2018 0853   TRIG 54 04/19/2018 0853   HDL 52 04/19/2018 0853   CHOLHDL 3.1 04/19/2018 0853   CHOLHDL 3 08/27/2015 0806   VLDL 11.0 08/27/2015 0806   LDLCALC 98 04/19/2018 0853   LDLDIRECT 113.0 09/16/2014 1208    PHYSICAL EXAM:    VS:  BP (!) 178/94 (BP Location: Right Arm, Patient Position: Sitting, Cuff Size: Large)    Pulse (!) 59    Ht 5\' 4"  (1.626 m)    Wt 185 lb (83.9 kg)    LMP 12/01/2013 (Approximate)    SpO2 99%    BMI 31.76 kg/m   BMI: Body mass index is 31.76 kg/m.  Physical Exam  Constitutional: She is oriented to person, place, and time. She appears well-developed and well-nourished.  HENT:  Head: Normocephalic and atraumatic.  Eyes: Right eye exhibits no discharge. Left eye exhibits no discharge.  Neck: Normal range of motion. No JVD present.  Cardiovascular: Normal rate, regular rhythm, S1 normal, S2 normal and normal heart sounds. Exam reveals no distant heart sounds, no friction rub, no midsystolic click and no opening snap.  No murmur heard. Pulses:      Posterior tibial pulses are 2+ on the right side and 2+ on the left side.  Pulmonary/Chest: Effort normal and breath sounds normal. No respiratory distress. She has no decreased breath sounds. She has no wheezes. She has no rales. She exhibits no tenderness.  Abdominal: Soft. She exhibits no distension. There is no abdominal tenderness.  Musculoskeletal:        General: No edema.  Neurological: She is alert and oriented to person, place, and time.  Skin: Skin is warm and dry. No cyanosis. Nails show no clubbing.  Psychiatric: She has a normal  mood and affect. Her speech is normal and behavior is normal. Judgment and thought content normal.    Wt Readings from Last 3 Encounters:  02/28/19 185 lb (83.9 kg)  12/19/18 192 lb (87.1 kg)  12/19/18 192 lb (87.1 kg)     ASSESSMENT &  PLAN:   1. HTN: Initial blood pressure elevated at 178/54 in the setting of the patient rushing to not miss her appointment.  Recheck blood pressure 139/81.  Blood pressure at home has been ranging from the Q000111Q to Q000111Q systolic.  Unfortunately, when her HCTZ was most recently refilled it was filled with capsules.  Patient indicates she does not tolerate any capsules as they lead to increased voiding and muscle cramping.  With this, she has not been taking them.  However, she did have a couple leftover HCTZ tablets and was taking those as needed for blood pressure greater than Q000111Q systolic.  She continues to take Bystolic 10 mg daily without issues.  We have recommended she continue current dose of Bystolic and refilled HCTZ 12.5 mg tablet as needed for blood pressure greater than 150.  Hopefully, as she continues to remain active and lose weight her blood pressure will continue to improve and we will be able to taper off some medication.  Check BMP.  2. Dizziness: Orthostatics negative in the office today.  Resolved.  3. Anxiety: Stable.  Followed by PCP.  4. Reflux: Remains on Protonix and Carafate.  Followed by GI.  Disposition: F/u with Dr. Rockey Situ or an APP in 6 months.   Medication Adjustments/Labs and Tests Ordered: Current medicines are reviewed at length with the patient today.  Concerns regarding medicines are outlined above. Medication changes, Labs and Tests ordered today are summarized above and listed in the Patient Instructions accessible in Encounters.   Signed, Christell Faith, PA-C 02/28/2019 12:08 PM     [address] [place], [place] [address] [phone]

## 2019-02-25 ENCOUNTER — Telehealth: Payer: Self-pay | Admitting: Internal Medicine

## 2019-02-25 MED ORDER — SUCRALFATE 1 G PO TABS: 1.0000 g | ORAL_TABLET | Freq: Three times a day (TID) | ORAL | 0 refills | Status: DC

## 2019-02-25 NOTE — Telephone Encounter (Signed)
Patient indicates that she is unable to afford Dexilant but the pantoprazole once daily and pepcid qhs is no longer working. She indicates that since Saturday night, she has been having horrible heartburn and has been unable to get any relief. She has been eating oatmeal and bland foods but is having to use every "homemade remedy" to ease her reflux at this time. Has tried baking soda, mustard and HOB elevation.   I advised that we could try increasing pantoprazole to twice daily dosing.   I have spoken to Algernon Huxley who indicates that we can also give patient a 2 weeks supply of carafate tablet (make into slurry) 1 gram before meals and at bedtime to help coat the esophagus and stomach.   If these 2 additions to patient's regimen do not work, then she should follow up in office with Dr Hilarie Fredrickson or an extender for further evaluation.  Sucralfate has been sent to pharmacy and patient advised to take twice daily PPI. She verbalizes understanding to call back in 2 weeks for visit if symptoms do not subside.

## 2019-02-28 ENCOUNTER — Ambulatory Visit (INDEPENDENT_AMBULATORY_CARE_PROVIDER_SITE_OTHER): Payer: No Typology Code available for payment source | Admitting: Physician Assistant

## 2019-02-28 ENCOUNTER — Other Ambulatory Visit: Payer: Self-pay

## 2019-02-28 ENCOUNTER — Encounter: Payer: Self-pay | Admitting: Physician Assistant

## 2019-02-28 VITALS — BP 178/94 | HR 59 | Ht 64.0 in | Wt 185.0 lb

## 2019-02-28 DIAGNOSIS — F419 Anxiety disorder, unspecified: Secondary | ICD-10-CM

## 2019-02-28 DIAGNOSIS — K2100 Gastro-esophageal reflux disease with esophagitis, without bleeding: Secondary | ICD-10-CM

## 2019-02-28 DIAGNOSIS — I10 Essential (primary) hypertension: Secondary | ICD-10-CM | POA: Diagnosis not present

## 2019-02-28 DIAGNOSIS — R42 Dizziness and giddiness: Secondary | ICD-10-CM

## 2019-02-28 MED ORDER — HYDROCHLOROTHIAZIDE 12.5 MG PO TABS: 12.5000 mg | ORAL_TABLET | ORAL | 3 refills | Status: DC | PRN

## 2019-02-28 NOTE — Patient Instructions (Signed)
Medication Instructions:  CHANGE HCTZ to Take 1 tablet (12.5 mg total) by mouth as needed (SBP > 150). If you need a refill on your cardiac medications before your next appointment, please call your pharmacy.   Lab work: None ordered  If you have labs (blood work) drawn today and your tests are completely normal, you will receive your results only by: Marland Kitchen MyChart Message (if you have MyChart) OR . A paper copy in the mail If you have any lab test that is abnormal or we need to change your treatment, we will call you to review the results.  Testing/Procedures: None ordered   Follow-Up: At [facility], you and your health needs are our priority.  As part of our continuing mission to provide you with exceptional heart care, we have created designated Provider Care Teams.  These Care Teams include your primary Cardiologist (physician) and Advanced Practice Providers (APPs -  Physician Assistants and Nurse Practitioners) who all work together to provide you with the care you need, when you need it. You will need a follow up appointment in 6 months.  Please call our office 2 months in advance to schedule this appointment. Please see Ida Rogue, MD.

## 2019-03-01 LAB — BASIC METABOLIC PANEL
BUN/Creatinine Ratio: 15 (ref 12–28)
BUN: 12 mg/dL (ref 8–27)
CO2: 26 mmol/L (ref 20–29)
Calcium: 9.5 mg/dL (ref 8.7–10.3)
Chloride: 105 mmol/L (ref 96–106)
Creatinine, Ser: 0.79 mg/dL (ref 0.57–1.00)
GFR calc Af Amer: 94 mL/min/{1.73_m2} (ref >59)
GFR calc non Af Amer: 82 mL/min/{1.73_m2} (ref >59)
Glucose: 78 mg/dL (ref 65–99)
Potassium: 3.8 mmol/L (ref 3.5–5.2)
Sodium: 143 mmol/L (ref 134–144)

## 2019-03-13 ENCOUNTER — Other Ambulatory Visit: Payer: Self-pay | Admitting: Cardiovascular Disease

## 2019-03-18 ENCOUNTER — Telehealth: Payer: Self-pay | Admitting: Internal Medicine

## 2019-03-18 ENCOUNTER — Telehealth: Payer: Self-pay | Admitting: Cardiovascular Disease

## 2019-03-18 ENCOUNTER — Other Ambulatory Visit: Payer: Self-pay | Admitting: *Deleted

## 2019-03-18 MED ORDER — AMLODIPINE BESYLATE 2.5 MG PO TABS: 2.5000 mg | ORAL_TABLET | Freq: Every day | ORAL | 3 refills | Status: DC | PRN

## 2019-03-18 MED ORDER — PANTOPRAZOLE SODIUM 40 MG PO TBEC: 40.0000 mg | DELAYED_RELEASE_TABLET | Freq: Two times a day (BID) | ORAL | 2 refills | Status: DC

## 2019-03-18 NOTE — Telephone Encounter (Signed)
I have spoken to patient to advise of Dr Vena Rua recommendations to continue pantoprazole twice daily and carafate prn. She will follow up in office if symptoms return. She also verbalizes understanding of his recommendations

## 2019-03-18 NOTE — Telephone Encounter (Signed)
Pt would like to report that she is doing well on sucralfate and that her GERD symptoms have subsided.  Pt would like to know whether she needs a FU appt regarding medication.

## 2019-03-18 NOTE — Telephone Encounter (Signed)
°*  STAT* If patient is at the pharmacy, call can be transferred to refill team.   1. Which medications need to be refilled? (please list name of each medication and dose if known) amlodipine 2.5 mg as needed  2. Which pharmacy/location (including street and city if local pharmacy) is medication to be sent to? [facility] [address] in [place]  3. Do they need a 30 day or 90 day supply? 90   Patient says that she has been on this medication but [facility] is stating she has no more refills.   Patient is completely out and about to go out of town on 10/1, needs asap

## 2019-03-18 NOTE — Telephone Encounter (Signed)
Please advise.   Looks like Amlodipine was discontinued 12/2018  Per Thurmond Butts Dunn's PA, last office note "For high blood pressure, she could take amlodipine 2.5 mg as needed or an extra 12.5 mg of HCTZ"  Medication is not currently on patients medication list.

## 2019-03-18 NOTE — Telephone Encounter (Signed)
If symptoms have improved I do not feel strongly about an office visit She can continue pantoprazole 40 mg twice daily AC Carafate slurry can be used 3 times daily before meals and at bedtime on an as-needed basis only.  Would not remain on Carafate continuously Office follow-up if she prefers or if symptoms return

## 2019-03-18 NOTE — Telephone Encounter (Signed)
Amlodipine 1 tablet (2.45 mg total) once daily as needed for HTN rx sent to pt preferred pharmacy as requested. Okay per Dr. Rockey Situ OV note 10/10/18.  Pt message sent to make her aware.

## 2019-03-18 NOTE — Telephone Encounter (Signed)
Dr Hilarie Fredrickson- Please see 02/25/19 note as well as todays note and advise as to whether you feel patient should be seen or if she can continue with current regimen.

## 2019-04-26 ENCOUNTER — Other Ambulatory Visit: Payer: Self-pay | Admitting: Physician Assistant

## 2019-05-06 ENCOUNTER — Ambulatory Visit (INDEPENDENT_AMBULATORY_CARE_PROVIDER_SITE_OTHER): Payer: No Typology Code available for payment source | Admitting: Family Medicine

## 2019-05-06 ENCOUNTER — Encounter: Payer: Self-pay | Admitting: Family Medicine

## 2019-05-06 ENCOUNTER — Other Ambulatory Visit: Payer: Self-pay

## 2019-05-06 VITALS — BP 118/77 | HR 80 | Ht 64.75 in | Wt 187.0 lb

## 2019-05-06 DIAGNOSIS — E669 Obesity, unspecified: Secondary | ICD-10-CM

## 2019-05-06 DIAGNOSIS — I10 Essential (primary) hypertension: Secondary | ICD-10-CM

## 2019-05-06 DIAGNOSIS — J309 Allergic rhinitis, unspecified: Secondary | ICD-10-CM | POA: Insufficient documentation

## 2019-05-06 DIAGNOSIS — R42 Dizziness and giddiness: Secondary | ICD-10-CM

## 2019-05-06 DIAGNOSIS — E161 Other hypoglycemia: Secondary | ICD-10-CM

## 2019-05-06 DIAGNOSIS — R7303 Prediabetes: Secondary | ICD-10-CM | POA: Diagnosis not present

## 2019-05-06 DIAGNOSIS — J3089 Other allergic rhinitis: Secondary | ICD-10-CM

## 2019-05-06 DIAGNOSIS — F39 Unspecified mood [affective] disorder: Secondary | ICD-10-CM

## 2019-05-06 DIAGNOSIS — E66811 Obesity, class 1: Secondary | ICD-10-CM | POA: Insufficient documentation

## 2019-05-06 MED ORDER — AZELASTINE HCL 0.1 % NA SOLN: 1.0000 | Freq: Two times a day (BID) | NASAL | 5 refills | Status: DC

## 2019-05-06 NOTE — Progress Notes (Signed)
Virtual / live video office visit note for [employer], D.O- Primary Care Physician at [facility]   I connected with current patient today and beyond visually recognizing the correct individual, I verified that I am speaking with the correct person using two identifiers.  . Location of the patient: Home . Location of the provider: Office Only the patient (+/- their family members at pt's discretion) and myself were participating in the encounter    - This visit type was conducted due to national recommendations for restrictions regarding the COVID-19 Pandemic (e.g. social distancing) in an effort to limit this patient's exposure and mitigate transmission in our community.  This format is felt to be most appropriate for this patient at this time.   - The patient did have access to video technology today  - No physical exam could be performed with this format, beyond that communicated to [place] by the patient/ family members as noted.   - Additionally my office staff/ schedulers discussed with the patient that there may be a monetary charge related to this service, depending on patient's medical insurance.   The patient expressed understanding, and agreed to proceed.      History of Present Illness:  I, Toni Amend, am serving as scribe for Dr. [name].    Notes doing pretty good today, "all things considered, I'm doing good, hanging in there."  Notes she's having a bad hair day today, but did go walking earlier for exercise.  She typically walks for an hour, almost every day.  - Allergic Rhinitis Uses Flonase; notes "in the morning when I get up and walk at 8 AM, my nose runs so badly that even before COVID I felt like I needed to wear a mask because the cool air gets my nose going crazy, and I keep a lot of clear mucus running."  Wants to know if there's anything she can do to keep this under control without carrying tissues constantly.  Per patient, went to  allergist some years ago because she has been dealing with this issue for several years; notes "he said I have allergic rhinitis and that I was allergic to the change of weather."  Notes her symptoms have not changed over the years; are still pretty bad.  Says "miserable while I'm outside; no runny eyes or itchy eyes, just the nose, and then when I come in the house it stops and I'm fine."  - Blood Sugar She wonders if she's due for A1c with her prediabetes.  Says she had been feeling good, lost some weight, and then fell off the wagon and "started eating stuff she wasn't supposed to eat (pizza, ice cream) and checked her blood sugar one morning and it was 115, 114," etc.  "I've been checking it, and like I said, it's been really great, 90-100, and shot up to 115 and stayed up there for three days."  Says started feeling "really crappy" after this, and "after I ate I felt horrible."  - Dizzy Spell Today with Walking; History of Dizzy Spells Last Year Says this morning when she went to take her walk, she felt "dizzy and like I was floating," says "I don't know how to explain it; I felt like I was going to faint."  Notes "now I've realized that my blood pressure takes a dip when I walk sometimes and it's weird."  When she checked her blood pressure at home it was 118/77.  Says last year  this same thing was happening, and she thought it was her blood sugar.  "When I talked to the endocrinologist about it, she said it was my blood pressure."  Says that these dizzy spells don't happen too often now that she's not on as much heart medicine, "but every now and then I feel dizzy when I'm walking.  I thought it was my blood pressure kinda dropping sometimes."  Notes the cardiologist hasn't said much about these symptoms.  Says she did report her symptoms to cardiology and he agreed she might have been bottoming out while on her former blood pressure management.  Notes "It was really happening a lot when I was  taking a lot of blood pressure medicines."  Says it's been hard to find blood pressure medicines that work for her that she doesn't feel sick on.  When she does experience episodes of dizziness, she checks her blood pressure and blood sugar.  Says sometimes also has a weird feeling in her leg, "sometimes when that dizziness comes on."  - Essential Hypertension  "The blood pressure is doing some weird stuff."  Says some days it will shoot up, and she has to take a 2.5 Norvasc to help bring it down, and then some days like yesterday it's great all day long.  -  Her blood pressure at home has been running: "Some days I'm really good, and other days I'll spike up to 170 in a heartbeat."  Notes she was discharged from the blood pressure clinic because her blood pressure was hovering around 140 all the time.  Says "now I see 130 range more than anything, and when I see under 120, I'm shocked because it's usually never that low."  Says her blood pressure at 118/77 is low for her, and usually not that low.  - Patient reports good compliance with medication and/or lifestyle modification  - Her denies acute concerns or problems related to treatment plan  - She denies new onset of: chest pain, exercise intolerance, shortness of breath, dizziness, visual changes, headache, lower extremity swelling or claudication.   Last 3 blood pressure readings in our office are as follows: BP Readings from Last 3 Encounters:  05/06/19 118/77  02/28/19 (!) 178/94  09/04/18 (!) 142/90   Filed Weights   05/06/19 1332  Weight: 187 lb (84.8 kg)      Depression screen [facility] 2/9 05/06/2019 08/07/2018 06/06/2018 03/08/2018 02/08/2018  Decreased Interest 0 0 - 0 0  Down, Depressed, Hopeless 0 0 1 0 0  PHQ - 2 Score 0 0 1 0 0  Altered sleeping 0 1 1 2  0  Tired, decreased energy 1 1 1 1 1   Change in appetite 0 0 - 0 0  Feeling bad or failure about yourself  0 0 0 0 0  Trouble concentrating 0 1 1 0 1  Moving slowly  or fidgety/restless 0 0 0 0 0  Suicidal thoughts 0 0 0 0 0  PHQ-9 Score 1 3 4 3 2   Difficult doing work/chores Not difficult at all Somewhat difficult Somewhat difficult Somewhat difficult -    GAD 7 : Generalized Anxiety Score 08/07/2018 06/06/2018  Nervous, Anxious, on Edge 0 1  Control/stop worrying 1 1  Worry too much - different things 0 0  Trouble relaxing 1 1  Restless 0 0  Easily annoyed or irritable 1 1  Afraid - awful might happen 0 1  Total GAD 7 Score 3 5  Anxiety Difficulty - Somewhat difficult  Impression and Recommendations:    1. Essential hypertension, benign   2. Prediabetes   3. chronic Dizziness- comes on walking   4. Allergic rhinitis due to other allergic trigger, unspecified seasonality   5. Reactive hypoglycemia   6. Mood disorder ([place])   7. Obesity (BMI 30.0-34.9)     Prediabetes, Reactive Hypoglycemia - A1c 5.7 a year ago, rock stable from 5.7 last check. - Discussed goal blood sugars and prudent monitoring with patient today. - Encouraged patient to check mainly fasting blood sugar and postprandial blood sugar.  - Prudent lifestyle and dietary habits encouraged. - Education provided and all questions answered.  - Will continue to monitor.  Allergic Rhinitis due to Other Allergic Trigger - Education provided to patient today regarding seasonal allergies. - Lengthy conversation held and all questions answered.  - Patient established on Flonase. - Encouraged patient to begin Astelin nasal spray.  Prescription provided. - Discussed doing both nasal sprays after sinus rinses.  - Advised the patient to begin using AYR or Neilmed sinus rinses BID followed by Flonase/Astelin BID (one spray to each nostril).  Advised that the patient may also incorporate regular allegra or claritin during allergy season PRN.   - In addition to this, discussed referral to allergist in future PRN to rule out known allergens. - Patient may also begin using air  purifier at night, removing rugs/deep cleaning house.  - Will continue to monitor.  Essential Hypertension - Discussed that occasional spikes in BP may be due to emotional state. - BP otherwise stable at this time. - Patient will continue current treatment regimen.  See med list.  - Counseled patient on pathophysiology of disease and discussed various treatment options, which always includes dietary and lifestyle modification as first line.   - Lifestyle changes such as dash and heart healthy diets and engaging in a regular exercise program discussed extensively with patient.   - Ambulatory blood pressure monitoring encouraged at least 3 times weekly.  Keep log and bring in every office visit.  Reminded patient that if they ever feel poorly in any way, to check their blood pressure and pulse.  - Handouts provided at patient's desire and/or told to go online at the [place] website for further information  - We will continue to monitor and patient will continue to follow up with cardiology as established.  Chronic Dizziness - Comes on While Walking - Reviewed symptoms extensively with patient today. - Education provided and all questions answered.  - As patient's dizziness seems to occur while walking, advised patient to follow up with cardiology as soon as possible.  Advised patient to call cardiology and let them know about her exertional dizziness and any associated symptoms, as this is considered a cardiac concern until proven otherwise.  - Reviewed red flag cardiac symptoms with patient today. - Patient knows to seek emergency care for red flag symptoms, and continue following up with cardiology for concerns as established and advised.  - Will continue to monitor.  BMI Counseling - Body mass index is 31.36 kg/m Explained to patient what BMI refers to, and what it means medically.    Told patient to think about it as a "medical risk stratification measurement" and  how increasing BMI is associated with increasing risk/ or worsening state of various diseases such as hypertension, hyperlipidemia, diabetes, premature OA, depression etc.  American Heart Association guidelines for healthy diet, basically Mediterranean diet, and exercise guidelines of 30 minutes 5 days per week or more  discussed in detail.  Health counseling performed.  All questions answered.  Lifestyle & Preventative Health Maintenance - Advised patient to continue working toward exercising to improve overall mental, physical, and emotional health.    - Reviewed the "spokes of the wheel" of mood and health management.  Stressed the importance of ongoing prudent habits, including regular exercise, appropriate sleep hygiene, healthful dietary habits, and prayer/meditation to relax.  - Encouraged patient to engage in daily physical activity, especially a formal exercise routine.  Recommended that the patient eventually strive for at least 150 minutes of moderate cardiovascular activity per week according to guidelines established by the [facility].   - Healthy dietary habits encouraged, including low-carb, and high amounts of lean protein in diet.   - Patient should also consume adequate amounts of water.  Recommendations - Return for CPE and fasting blood work near future.   - As part of my medical decision making, I reviewed the following data within the [place] History obtained from pt /family, CMA notes reviewed and incorporated if applicable, Labs reviewed, Radiograph/ tests reviewed if applicable and OV notes from prior OV's with me, as well as other specialists she/he has seen since seeing me last, were all reviewed and used in my medical decision making process today.   - Additionally, discussion had with patient regarding txmnt plan, their biases about that plan etc were used in my medical decision making today.   - The patient agreed with the plan and demonstrated an  understanding of the instructions.   No barriers to understanding were identified.   - Red flag symptoms and signs discussed in detail.  Patient expressed understanding regarding what to do in case of emergency\ urgent symptoms.  The patient was advised to call back or seek an in-person evaluation if the symptoms worsen or if the condition fails to improve as anticipated.   Return for CPE and full fasting blood work near future at [facility].    No orders of the defined types were placed in this encounter.   Meds ordered this encounter  Medications  . azelastine (ASTELIN) 0.1 % nasal spray    Sig: Place 1 spray into both nostrils 2 (two) times daily. after sinus rinses    Dispense:  30 mL    Refill:  5    Medications Discontinued During This Encounter  Medication Reason  . sucralfate (CARAFATE) 1 g tablet Error      Note:  This note was prepared with assistance of Dragon voice recognition software. Occasional wrong-word or sound-a-like substitutions may have occurred due to the inherent limitations of voice recognition software.  This document serves as a record of services personally performed by [name], DO. It was created on her behalf by Toni Amend, a trained medical scribe. The creation of this record is based on the scribe's personal observations and the provider's statements to them.   This case required medical decision making of at least moderate complexity. The above documentation has been reviewed to be accurate and was completed by Marjory Sneddon, D.O.     Patient Care Team    Relationship Specialty Notifications Start End  [name], DO PCP - General Family Medicine  02/08/18   Minna Merritts, MD PCP - Cardiology Cardiology Admissions 06/05/18   Minna Merritts, MD Consulting Physician Cardiology  03/14/13   [name], MD  Internal Medicine  02/08/18   Malon Kindle, MD Referring Physician Neurology  02/08/18  Jerene Bears, MD Consulting Physician Gastroenterology  02/08/18   Jacelyn Pi, MD Consulting Physician Endocrinology  02/08/18   Allyn Kenner, DO Consulting Physician Obstetrics and Gynecology  02/08/18     -Vitals obtained; medications/ allergies reconciled;  personal medical, social, Sx etc.histories were updated by CMA, reviewed by me and are reflected in chart  Patient Active Problem List   Diagnosis Date Noted  . Prediabetes 11/10/2017    Priority: High  . Essential hypertension, benign 01/28/2013    Priority: High  . Mood disorder ([place]) 03/07/2018    Priority: Medium  . 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 Primary insomnia 05/05/2014    Priority: Medium  . Hx of seizure disorder 11/10/2017    Priority: Low  . Migraine without aura and without status migrainosus, not intractable 11/10/2017    Priority: Low  . Spells of trembling 11/10/2017    Priority: Low  . 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 Chronic venous insufficiency 04/21/2014    Priority: Low  . 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 Obesity (BMI 30.0-34.9) 05/06/2019  . Allergic rhinitis 05/06/2019  . Insomnia due to stress, anxiety and fear 06/06/2018  . Noncompliance with diet and medication regimen 04/21/2018  . Polyp of corpus uteri 02/08/2018  . Near syncope 10/13/2017  . Sinusitis 07/16/2017  . Edema 04/29/2017  . 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 Dizziness 10/08/2016  . Obesity with body mass index 30 or greater 08/09/2016  . Vaginal discharge 08/09/2016  . History of adenomatous polyp of colon 08/04/2016  . Menopausal symptom 10/25/2014  . Anxiety 05/05/2014  . Adenomatous polyp of colon 05/12/2013  . Visit for preventive health examination 03/14/2013  . Pain 03/11/2013  . Vulvitis 01/28/2013     Current Meds  Medication Sig  . amLODipine (NORVASC) 2.5 MG  tablet Take 1 tablet (2.5 mg total) by mouth daily as needed (for high blood pressure).  . BYSTOLIC 10 MG tablet TAKE 1 TABLET BY MOUTH EVERY DAY  . Cholecalciferol (VITAMIN D-3) 1000 UNITS CAPS Take 1 capsule by mouth daily.  . fluticasone (FLONASE) 50 MCG/ACT nasal spray Place 1 spray into both nostrils as needed.   . hydrochlorothiazide (HYDRODIURIL) 12.5 MG tablet TAKE 1 TABLET (12.5 MG TOTAL) BY MOUTH AS NEEDED (SBP > 150).  Marland Kitchen MAGNESIUM MALATE PO Take 1 tablet by mouth daily.   . Misc Natural Products (APPLE CIDER VINEGAR DIET PO) [place]oli Gummies 2 gummies TID.  . Multiple Vitamins-Minerals (MULTIVITAMIN WITH MINERALS) tablet Take 1 tablet by mouth daily.  . Multiple Vitamins-Minerals (ZINC PO) Take by mouth as needed.  . pantoprazole (PROTONIX) 40 MG tablet Take 1 tablet (40 mg total) by mouth 2 (two) times daily before a meal. Pharmacy-please d/c rx for once daily dosing  . Specialty Vitamins Products (VITAMINS FOR THE HAIR PO) Take 1 tablet by mouth daily.     Allergies  Allergen Reactions  . Losartan Shortness Of Breath    Dizziness,  Shortness of breath   . Anesthetics, Amide Nausea And Vomiting  . Benadryl [Diphenhydramine Hcl (Sleep)]     Made feel paralyzed when had benadryl with a cocktail for procedure  Decadron, bendayl, regaln combo at [facility] 2014- see care every where   . Benicar [Olmesartan]     Presyncopal , light headed   . Cardura [Doxazosin Mesylate] Other (See Comments)    "  makes me feel terrible"  . Codeine     Other reaction(s): Unknown  . Fentanyl Hypertension    Severe htn   . Losartan Potassium-Hctz     Dizziness,  Shortness of breath   . Metformin And Related     "out of body experience"    . Prilocaine Nausea And Vomiting     ROS:  See above HPI for pertinent positives and negatives   Objective:   Blood pressure 118/77, pulse 80, height 5' 4.75" (1.645 m), weight 187 lb (84.8 kg), last menstrual period 12/01/2013.  (if some vitals are omitted,  this means that patient was UNABLE to obtain them even though they were asked to get them prior to OV today.  They were asked to call us at their earliest convenience with these once obtained.)  General: A & O * 3; visually in no acute distress; in usual state of health.  Skin: Visible skin appears normal and pt's usual skin color HEENT:  EOMI, head is normocephalic and atraumatic.  Sclera are anicteric. Neck has a good range of motion.  Lips are noncyanotic Chest: normal chest excursion and movement Respiratory: speaking in full sentences, no conversational dyspnea; no use of accessory muscles Psych: insight good, mood- appears full

## 2019-05-07 ENCOUNTER — Telehealth: Payer: Self-pay | Admitting: Family Medicine

## 2019-05-07 DIAGNOSIS — Z780 Asymptomatic menopausal state: Secondary | ICD-10-CM

## 2019-05-07 DIAGNOSIS — Z79899 Other long term (current) drug therapy: Secondary | ICD-10-CM

## 2019-05-07 NOTE — Telephone Encounter (Signed)
Patient is aware that we normally don't start bone density scan until age 60.  Patient is concerned that because of medication that she takes Pantoprazole can deplete calcium from bones and she has been taking it for 5 yrs and wanted to double check. Please advise. AS, CMA

## 2019-05-07 NOTE — Telephone Encounter (Signed)
Yes that is totally fine to send her for bone density scan.  Thank you.  Diagnosis codes would be 1-postmenopausal bone loss, 2-high risk medication use

## 2019-05-07 NOTE — Telephone Encounter (Signed)
Patient called states she forgot to ask provider " should she get a Bone Density test ? " unsure what requirements suggest a Patient / female needs to get ton performed --Please advise patient @ [phone].  --glh

## 2019-05-07 NOTE — Telephone Encounter (Signed)
Bone density scan ordered. Patient is aware. AS, CMA

## 2019-05-19 ENCOUNTER — Other Ambulatory Visit: Payer: Self-pay | Admitting: Family Medicine

## 2019-05-19 DIAGNOSIS — Z1231 Encounter for screening mammogram for malignant neoplasm of breast: Secondary | ICD-10-CM

## 2019-05-22 ENCOUNTER — Ambulatory Visit
Admission: RE | Admit: 2019-05-22 | Discharge: 2019-05-22 | Disposition: A | Discharge status: Home / Self Care | Payer: No Typology Code available for payment source | Source: Ambulatory Visit | Attending: Family Medicine | Admitting: Family Medicine

## 2019-05-22 ENCOUNTER — Other Ambulatory Visit: Payer: Self-pay

## 2019-05-22 DIAGNOSIS — Z780 Asymptomatic menopausal state: Secondary | ICD-10-CM

## 2019-05-22 DIAGNOSIS — Z1231 Encounter for screening mammogram for malignant neoplasm of breast: Secondary | ICD-10-CM

## 2019-05-22 DIAGNOSIS — Z79899 Other long term (current) drug therapy: Secondary | ICD-10-CM

## 2019-06-04 ENCOUNTER — Telehealth: Payer: Self-pay

## 2019-06-04 NOTE — Telephone Encounter (Signed)
Patient would like bystolic samples.

## 2019-06-04 NOTE — Telephone Encounter (Signed)
Medication Samples have been provided to the patient.  Drug name: Bystolic     Strength: 10 mg         Qty: 4 boxes           LOT: [id number]  Exp.Date: 07/2020   Dolores Lory 2:30 PM 06/04/2019

## 2019-06-11 ENCOUNTER — Telehealth: Payer: Self-pay | Admitting: Family Medicine

## 2019-06-11 NOTE — Telephone Encounter (Signed)
Patient called requesting to speak with clinic staff about her bone density study she had a few weeks ago, she wants to discuss her results.  Also, she has been curious about her pre diabetes. She states that in the past few months she has had increased urination and increased thirst. She is coming in 06/24/19 for her CPE/FBW but wanted to let the clinic staff know about this update.

## 2019-06-11 NOTE — Telephone Encounter (Signed)
Pt states that she has been awakening with an extreme dry mouth and urinary frequency for last week.  Pt has lab appt on 06/24/19 and CPE with Dr. Raliegh Scarlet.  Advised pt to address these concerns with Dr. Raliegh Scarlet after labs and CPE.  Charyl Bigger, CMA

## 2019-06-24 ENCOUNTER — Ambulatory Visit (INDEPENDENT_AMBULATORY_CARE_PROVIDER_SITE_OTHER): Payer: No Typology Code available for payment source | Admitting: Family Medicine

## 2019-06-24 ENCOUNTER — Other Ambulatory Visit: Payer: No Typology Code available for payment source

## 2019-06-24 ENCOUNTER — Other Ambulatory Visit: Payer: Self-pay | Admitting: Family Medicine

## 2019-06-24 ENCOUNTER — Other Ambulatory Visit: Payer: Self-pay

## 2019-06-24 ENCOUNTER — Encounter: Payer: Self-pay | Admitting: Family Medicine

## 2019-06-24 VITALS — BP 146/76 | HR 65 | Temp 97.9°F | Resp 12 | Ht 64.0 in | Wt 191.8 lb

## 2019-06-24 DIAGNOSIS — I10 Essential (primary) hypertension: Secondary | ICD-10-CM | POA: Diagnosis not present

## 2019-06-24 DIAGNOSIS — R208 Other disturbances of skin sensation: Secondary | ICD-10-CM | POA: Diagnosis not present

## 2019-06-24 DIAGNOSIS — R42 Dizziness and giddiness: Secondary | ICD-10-CM

## 2019-06-24 DIAGNOSIS — F419 Anxiety disorder, unspecified: Secondary | ICD-10-CM

## 2019-06-24 DIAGNOSIS — Z1283 Encounter for screening for malignant neoplasm of skin: Secondary | ICD-10-CM

## 2019-06-24 DIAGNOSIS — I872 Venous insufficiency (chronic) (peripheral): Secondary | ICD-10-CM

## 2019-06-24 DIAGNOSIS — E669 Obesity, unspecified: Secondary | ICD-10-CM | POA: Diagnosis not present

## 2019-06-24 DIAGNOSIS — R7303 Prediabetes: Secondary | ICD-10-CM | POA: Diagnosis not present

## 2019-06-24 DIAGNOSIS — Z7185 Encounter for immunization safety counseling: Secondary | ICD-10-CM | POA: Insufficient documentation

## 2019-06-24 DIAGNOSIS — Z7189 Other specified counseling: Secondary | ICD-10-CM

## 2019-06-24 DIAGNOSIS — Z0001 Encounter for general adult medical examination with abnormal findings: Secondary | ICD-10-CM

## 2019-06-24 DIAGNOSIS — E161 Other hypoglycemia: Secondary | ICD-10-CM

## 2019-06-24 DIAGNOSIS — F604 Histrionic personality disorder: Secondary | ICD-10-CM

## 2019-06-24 DIAGNOSIS — Z0000 Encounter for general adult medical examination without abnormal findings: Secondary | ICD-10-CM

## 2019-06-24 DIAGNOSIS — E66811 Obesity, class 1: Secondary | ICD-10-CM

## 2019-06-24 DIAGNOSIS — R682 Dry mouth, unspecified: Secondary | ICD-10-CM | POA: Diagnosis not present

## 2019-06-24 DIAGNOSIS — Z719 Counseling, unspecified: Secondary | ICD-10-CM

## 2019-06-24 NOTE — Progress Notes (Signed)
Impression and Recommendations:    1. Encounter for wellness examination   2. Health education/counseling   3. Essential hypertension, benign   4. Prediabetes   5. Dry mouth, unspecified   6. 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 Obesity (BMI 30.0-34.9)   9. Screening exam for skin cancer   10. Vaccine counseling      I. Female Physical  1) Anticipatory Guidance: Discussed importance of wearing a seatbelt while driving, not texting while driving; sunscreen when outside along with yearly skin surveillance; eating a well balanced and modest diet; physical activity at least 25 minutes per day or 150 min/ week of moderate to intense activity.  - Advised patient to begin yearly skin screenings with dermatology.  Ambulatory referral to dermatology placed today.  See orders.  2) Immunizations / Screenings / Labs:   All immunizations and screenings that patient agrees to, are up-to-date per recommendations or will be updated today.  Patient understands the needs for q 49mo dental and yearly vision screens which pt will schedule independently. Obtain CBC, CMP, HgA1c, Lipid panel, TSH and vit D when fasting if not already done recently.   - Obtained last colonoscopy in 3 of 2018 with Dr. Hilarie Fredrickson, 5 year repeat ordered.  - Last bone density scan obtained 05/22/2019, through OBGYN.  - Last mammogram normal, obtained on 05/23/2019.  - Last pap obtained with OBGYN, normal as of 11/12/2018.  - Patient will continue to follow up with OBGYN.  - Vaccine counseling provided.  Patient declined flu, declined shingrix.   Per patient had a reaction to TDAP in the past and states she does not need flu vaccination, wishes to wait on shingrix until after symptoms on back resolve.  3) Health Counseling & Preventative Maintenance  Discussed goal of losing even 5-10% of current body weight which would improve overall feelings of well being and improve objective health data significantly.    Improve nutrient density of diet through increasing intake of fruits and vegetables and decreasing saturated/trans fats, white flour products and refined sugar products.   American Heart Association guidelines for healthy diet, basically Mediterranean diet, and exercise guidelines of 30 minutes 5 days per week or more discussed in detail.  - Advised patient to continue working toward exercising to improve overall mental, physical, and emotional health.    - Reviewed the "spokes of the wheel" of mood and health management.  Stressed the importance of ongoing prudent habits, including regular exercise, appropriate sleep hygiene, healthful dietary habits, and prayer/meditation to relax.  - Encouraged patient to engage in daily physical activity, especially a formal exercise routine.  Recommended that the patient eventually strive for at least 150 minutes of moderate cardiovascular activity per week according to guidelines established by the [facility].   - Healthy dietary habits encouraged, including low-carb, and high amounts of lean protein in diet.   - Patient should also consume adequate amounts of water.  - Health counseling performed.  All questions answered.  II. Chronic Concerns - Reviewed patient's symptoms of concern during appointment today.  1) Dysesthesia - Discussed that patient's symptoms do not resemble shingles at this time.    - Advised that she may have nerve impingement from overuse or increased activities, such as cooking or decorating, over the holidays.  - Encouraged patient to keep a log regarding when her symptoms worsen or improve.  - Encouraged patient to engage in increased physical activity, to improve circulation and relaxation.  - Will continue  to monitor.  2) Dry Mouth - Discussed patient's symptoms at length.  Advised patient that during this time of year, the heat is on, contributing to increased dehydration.   - Advised patient to hydrate adequately.  - As  dry mouth occurs primarily at night, told patient to utilize humidifier at bedside.  - A1c will be drawn today to assess for prediabetes.  - Will continue to monitor.  3) Essential Hypertension - Poorly Controlled - Blood pressure elevated on intake today. - Per patient, BP controlled at home, but may have discontinued BP meds due to recent concerns of dry mouth and increased urination.  - Discussed that control of BP is deteriorated at this time. - Advised patient to be consistent with all medication as prescribed.  - Counseled patient on pathophysiology of disease and discussed various treatment options, which always includes dietary and lifestyle modification as first line.   - Ongoing prudent lifestyle changes such as dash and heart healthy diets and engaging in a regular exercise program discussed extensively with patient.   - Ambulatory blood pressure monitoring encouraged daily.  Keep log and bring in every office visit.  Reminded patient that if they ever feel poorly in any way, to check their blood pressure and pulse.  - We will continue to monitor  Recommendations - Return in one month for BP re-check. - Patient agrees to keep track of her BP and pulse daily. - Discussed need to review BP log with patient at upcoming OV.   Orders Placed This Encounter  Procedures  . Ambulatory referral to Dermatology    Return for f/up in 2-4 wks with BP log and lab review + additional concerns, DOXY available.   Reminded pt important of f-up preventative CPE in 1 year.  Reminded pt again, this is in addition to any chronic care visits.    Gross side effects, risk and benefits, and alternatives of medications discussed with patient.  Patient is aware that all medications have potential side effects and we are unable to predict every side effect or drug-drug interaction that may occur.  Expresses verbal understanding and consents to current therapy plan and treatment regimen.  F-up  preventative CPE in 1 year- reminded pt again, this is in addition to any chronic care visits.    Please see orders placed and AVS handed out to patient at the end of our visit for further patient instructions/ counseling done pertaining to today's office visit.  This document serves as a record of services personally performed by [name], DO. It was created on her behalf by Toni Amend, a trained medical scribe. The creation of this record is based on the scribe's personal observations and the provider's statements to them.   This case required medical decision making of at least moderate complexity. The above documentation has been reviewed to be accurate and was completed by Marjory Sneddon, D.O.     Subjective:    I, Toni Amend, am serving as scribe for Dr. [name].  Chief Complaint  Patient presents with  . Annual Exam   CC:   HPI: Nicole Gould is a 59 y.o. female who [facility] a yearly health maintenance exam.  Health Maintenance Summary Reviewed and updated, unless pt declines services.  Colonoscopy:   History of adenomatous polyp of colon.  Last obtained colonoscopy in 2018 with Dr. Hilarie Fredrickson; one polyp was found in the rectal area.  Repeat colonoscopy recommended, to be  determined by pathology results.  Patient notes they found five polyps during the colonoscopy prior and "they all were the kind that would've gone the other way."  Additionally, she reports a significant family history of colon cancer. Tobacco History Reviewed:  Y; never smoker.  Alcohol:  No concerns, no excessive use. Exercise Habits:  Not meeting AHA guidelines. STD concerns:  None. Drug Use:  None. Birth control method:  Post-menopausal. Menses regular:  Post-menopausal. Lumps or breast concerns:  None reported.  Breast Cancer Family History:  None reported.   Bone/ DEXA scan:  Last obtained 05/22/2019.  Thinks her  BP was elevated on intake because she has anxiety today and concerns about her blood work.  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  - Eye Health Notes she has her eyes checked about once yearly.  - Dental Health Just saw the dentist in October.  - Tingling Sensation between Shoulder Blades Has been having tingling for about a month.  Notes a "weird feeling in her back, in between her shoulder blades," and was worried it was possibly shingles at the time.  Says "it just feels like a tingling nerve, a burning sting that hurts real bad."  She's never had this symptom before.  Says "there's something going on with my back because I feel a bad pain and itching."  Notes the pain and symptoms come on at random times.  - Dry Mouth Notes she's had a couple of times with extremely dry mouth while sleeping, and wakes up with her mouth extremely dry.  States "I've been really really dry, and then twice out of that month, I was peeing like crazy."  She denies excessive thirst; just reiterates "I wake up with my mouth dry."  She is unsure how many ounces of water she is drinking, but tries to drink at least six or more bottles of water per day.  States over the Christmas holidays, when she was busy shopping, etc., she was not drinking as much water.  Notes when she drinks more water, she has to urinate more.  - Blood Sugar States that in the past, her endocrinologist told her to never eat sugar again, but she did splurge over the holidays.   Immunization History  Administered Date(s) Administered  . Influenza-Unspecified 04/06/2014    Health Maintenance  Topic Date Due  . URINE MICROALBUMIN  07/21/1968  . INFLUENZA VACCINE  09/17/2019 (Originally 01/18/2019)  . TETANUS/TDAP  06/23/2020 (Originally 07/21/1977)    . MAMMOGRAM  05/21/2021  . COLONOSCOPY  08/28/2021  . PAP SMEAR-Modifier  11/11/2021  . Hepatitis C Screening  Completed  . HIV Screening  Completed     Wt Readings from Last 3 Encounters:  06/24/19 191 lb 12.8 oz (87 kg)  05/06/19 187 lb (84.8 kg)  02/28/19 185 lb (83.9 kg)   BP Readings from Last 3 Encounters:  06/26/19 (!) 142/78  06/24/19 (!) 146/76  05/06/19 118/77   Pulse Readings from Last 3 Encounters:  06/24/19 65  05/06/19 80  02/28/19 (!) 59     Past Medical History:  Diagnosis Date  . Chicken pox   . Chronic Dizziness   . Colon polyps   . Concussion   . Frequent headaches   . GERD (gastroesophageal reflux disease)   . History of  stress test    a. 2005 - in setting of ectopy - reportedly nl.  . Hypertension    a. 03/2018 24hr ABM: Mean BP 146/75, mean HR 61; Mean awake BP/HR 150/78, 62; Mean sleep BP/HR 130/65, 58.  . Migraines   . Pre-eclampsia    with grand mal seizures  . Seizure disorder in pregnancy ([place])   . Seizures ([place])    with pregnancy 1981- pre clampsia       Past Surgical History:  Procedure Laterality Date  . COLONOSCOPY    . DILATION AND CURETTAGE OF UTERUS  09/08/2014   with hystereoscopy  . POLYPECTOMY    . TONSILLECTOMY    . TUBAL LIGATION        Family History  Problem Relation Age of Onset  . Hypertension Mother   . Diabetes Mother   . Heart disease Father   . Kidney disease Father   . Hypertension Father   . Diabetes Father   . Cancer Maternal Grandfather        colon cancer  . Colon cancer Maternal Grandfather        passed age 56   . Colon polyps Neg Hx   . Breast cancer Neg Hx       Social History   Substance and Sexual Activity  Drug Use No  ,   Social History   Substance and Sexual Activity  Alcohol Use Yes   Comment: very rare - glass red wine when that   ,   Social History   Tobacco Use  Smoking Status Never Smoker  Smokeless Tobacco Never Used  ,   Social History   Substance  and Sexual Activity  Sexual Activity Yes  . Partners: Male    Current Outpatient Medications on File Prior to Visit  Medication Sig Dispense Refill  . amLODipine (NORVASC) 2.5 MG tablet Take 1 tablet (2.5 mg total) by mouth daily as needed (for high blood pressure). 90 tablet 3  . azelastine (ASTELIN) 0.1 % nasal spray Place 1 spray into both nostrils 2 (two) times daily. after sinus rinses 30 mL 5  . BYSTOLIC 10 MG tablet TAKE 1 TABLET BY MOUTH EVERY DAY 90 tablet 0  . Cholecalciferol (VITAMIN D-3) 1000 UNITS CAPS Take 1 capsule by mouth daily.    Marland Kitchen CINNAMON PO Take by mouth.    . fluticasone (FLONASE) 50 MCG/ACT nasal spray Place 1 spray into both nostrils as needed.   0  . hydrochlorothiazide (HYDRODIURIL) 12.5 MG tablet TAKE 1 TABLET (12.5 MG TOTAL) BY MOUTH AS NEEDED (SBP > 150). 90 tablet 1  . MAGNESIUM MALATE PO Take 1 tablet by mouth daily.     . Misc Natural Products (APPLE CIDER VINEGAR DIET PO) [place] Gummies 2 gummies TID.    . Multiple Vitamins-Minerals (MULTIVITAMIN WITH MINERALS) tablet Take 1 tablet by mouth daily.    . Multiple Vitamins-Minerals (ZINC PO) Take by mouth as needed.    . pantoprazole (PROTONIX) 40 MG tablet Take 1 tablet (40 mg total) by mouth 2 (two) times daily before a meal. Pharmacy-please d/c rx for once daily dosing 60 tablet 2  . Specialty Vitamins Products (VITAMINS FOR THE HAIR PO) Take 1 tablet by mouth daily.    . AMBULATORY NON FORMULARY MEDICATION Medication Name: Human Super Beats- 1 teaspoon of Powder prn     No current facility-administered medications on file prior to visit.    Allergies: Losartan; Anesthetics, amide; Benadryl [diphenhydramine hcl (sleep)]; Benicar [olmesartan]; Cardura [doxazosin  mesylate]; Codeine; Fentanyl; Losartan potassium-hctz; Metformin and related; and Prilocaine  Review of Systems: General:   Denies fever, chills, unexplained weight loss.  Optho/Auditory:   Denies visual changes, blurred vision/LOV Respiratory:    Denies SOB, DOE more than baseline levels.   Cardiovascular:   Denies chest pain, palpitations, new onset peripheral edema  Gastrointestinal:   Denies nausea, vomiting, diarrhea.  Genitourinary: Denies dysuria, freq/ urgency, flank pain or discharge from genitals.  Endocrine:     Denies hot or cold intolerance, polyuria, polydipsia. Musculoskeletal:   Denies unexplained myalgias, joint swelling, unexplained arthralgias, gait problems.  Skin:  Denies rash, suspicious lesions Neurological:     Denies dizziness, unexplained weakness, numbness  Psychiatric/Behavioral:   Denies mood changes, suicidal or homicidal ideations, hallucinations    Objective:    Blood pressure (!) 146/76, pulse 65, temperature 97.9 F (36.6 C), temperature source Oral, resp. rate 12, height 5\' 4"  (1.626 m), weight 191 lb 12.8 oz (87 kg), last menstrual period 12/01/2013, SpO2 100 %. Body mass index is 32.92 kg/m. General Appearance:    Alert, cooperative, no distress, appears stated age  Head:    Normocephalic, without obvious abnormality, atraumatic  Eyes:    PERRL, conjunctiva/corneas clear, EOM's intact, fundi    benign, both eyes  Ears:    Normal TM's and external ear canals, both ears  Nose:   Nares normal, septum midline, mucosa normal, no drainage    or sinus tenderness  Throat:   Lips w/o lesion, mucosa moist, and tongue normal; teeth and   gums normal  Neck:   Supple, symmetrical, trachea midline, no adenopathy;    thyroid:  no enlargement/tenderness/nodules; no carotid   bruit or JVD  Back:     Symmetric, no curvature, ROM normal, no CVA tenderness  Lungs:     Clear to auscultation bilaterally, respirations unlabored, no       Wh/ R/ R  Chest Wall:    No tenderness or gross deformity; normal excursion   Heart:    Regular rate and rhythm, S1 and S2 normal, no murmur, rub   or gallop  Breast Exam:    Deferred to OBGYN.  Abdomen:     Soft, non-tender, bowel sounds active all four quadrants, NO   G/R/R,  no masses, no organomegaly  Genitalia:    Deferred to OBGYN.  Rectal:    Deferred to OBGYN.  Extremities:   Extremities normal, atraumatic, no cyanosis or gross edema  Pulses:   2+ and symmetric all extremities  Skin:   Slightly Hypermelanotic patch of skin appreciated under bra clasp- otherwise appears WNL. Warm, dry, Skin color, texture, turgor normal, no obvious rashes or lesions Psych: No HI/SI, judgement and insight good, Euthymic mood. Full Affect.  Neurologic:   CNII-XII intact, normal strength, sensation and reflexes    Throughout

## 2019-06-24 NOTE — Patient Instructions (Addendum)
Preventive Care for Adults, Female  A healthy lifestyle and preventive care can promote health and wellness. Preventive health guidelines for women include the following key practices.   A routine yearly physical is a good way to check with your health care provider about your health and preventive screening. It is a chance to share any concerns and updates on your health and to receive a thorough exam.   Visit your dentist for a routine exam and preventive care every 6 months. Brush your teeth twice a day and floss once a day. Good oral hygiene prevents tooth decay and gum disease.   The frequency of eye exams is based on your age, health, family medical history, use of contact lenses, and other factors. Follow your health care provider's recommendations for frequency of eye exams.   Eat a healthy diet. Foods like vegetables, fruits, whole grains, low-fat dairy products, and lean protein foods contain the nutrients you need without too many calories. Decrease your intake of foods high in solid fats, added sugars, and salt. Eat the right amount of calories for you.Get information about a proper diet from your health care provider, if necessary.   Regular physical exercise is one of the most important things you can do for your health. Most adults should get at least 150 minutes of moderate-intensity exercise (any activity that increases your heart rate and causes you to sweat) each week. In addition, most adults need muscle-strengthening exercises on 2 or more days a week.   Maintain a healthy weight. The body mass index (BMI) is a screening tool to identify possible weight problems. It provides an estimate of body fat based on height and weight. Your health care provider can find your BMI, and can help you achieve or maintain a healthy weight.For adults 20 years and older:   - A BMI below 18.5 is considered underweight.   - A BMI of 18.5 to 24.9 is normal.   - A BMI of 25 to 29.9 is  considered overweight.   - A BMI of 30 and above is considered obese.   Maintain normal blood lipids and cholesterol levels by exercising and minimizing your intake of trans and saturated fats.  Eat a balanced diet with plenty of fruit and vegetables. Blood tests for lipids and cholesterol should begin at age 20 and be repeated every 5 years minimum.  If your lipid or cholesterol levels are high, you are over 40, or you are at high risk for heart disease, you may need your cholesterol levels checked more frequently.Ongoing high lipid and cholesterol levels should be treated with medicines if diet and exercise are not working.   If you smoke, find out from your health care provider how to quit. If you do not use tobacco, do not start.   Lung cancer screening is recommended for adults aged 55-80 years who are at high risk for developing lung cancer because of a history of smoking. A yearly low-dose CT scan of the lungs is recommended for people who have at least a 30-pack-year history of smoking and are a current smoker or have quit within the past 15 years. A pack year of smoking is smoking an average of 1 pack of cigarettes a day for 1 year (for example: 1 pack a day for 30 years or 2 packs a day for 15 years). Yearly screening should continue until the smoker has stopped smoking for at least 15 years. Yearly screening should be stopped for people who develop a   health problem that would prevent them from having lung cancer treatment.   If you are pregnant, do not drink alcohol. If you are breastfeeding, be very cautious about drinking alcohol. If you are not pregnant and choose to drink alcohol, do not have more than 1 drink per day. One drink is considered to be 12 ounces (355 mL) of beer, 5 ounces (148 mL) of wine, or 1.5 ounces (44 mL) of liquor.   Avoid use of street drugs. Do not share needles with anyone. Ask for help if you need support or instructions about stopping the use of  drugs.   High blood pressure causes heart disease and increases the risk of stroke. Your blood pressure should be checked at least yearly.  Ongoing high blood pressure should be treated with medicines if weight loss and exercise do not work.   If you are 55-79 years old, ask your health care provider if you should take aspirin to prevent strokes.   Diabetes screening involves taking a blood sample to check your fasting blood sugar level. This should be done once every 3 years, after age 45, if you are within normal weight and without risk factors for diabetes. Testing should be considered at a younger age or be carried out more frequently if you are overweight and have at least 1 risk factor for diabetes.   Breast cancer screening is essential preventive care for women. You should practice "breast self-awareness."  This means understanding the normal appearance and feel of your breasts and may include breast self-examination.  Any changes detected, no matter how small, should be reported to a health care provider.  Women in their 20s and 30s should have a clinical breast exam (CBE) by a health care provider as part of a regular health exam every 1 to 3 years.  After age 40, women should have a CBE every year.  Starting at age 40, women should consider having a mammogram (breast X-ray test) every year.  Women who have a family history of breast cancer should talk to their health care provider about genetic screening.  Women at a high risk of breast cancer should talk to their health care providers about having an MRI and a mammogram every year.   -Breast cancer gene (BRCA)-related cancer risk assessment is recommended for women who have family members with BRCA-related cancers. BRCA-related cancers include breast, ovarian, tubal, and peritoneal cancers. Having family members with these cancers may be associated with an increased risk for harmful changes (mutations) in the breast cancer genes BRCA1 and  BRCA2. Results of the assessment will determine the need for genetic counseling and BRCA1 and BRCA2 testing.   The Pap test is a screening test for cervical cancer. A Pap test can show cell changes on the cervix that might become cervical cancer if left untreated. A Pap test is a procedure in which cells are obtained and examined from the lower end of the uterus (cervix).   - Women should have a Pap test starting at age 21.   - Between ages 21 and 29, Pap tests should be repeated every 2 years.   - Beginning at age 30, you should have a Pap test every 3 years as long as the past 3 Pap tests have been normal.   - Some women have medical problems that increase the chance of getting cervical cancer. Talk to your health care provider about these problems. It is especially important to talk to your health care provider if a   new problem develops soon after your last Pap test. In these cases, your health care provider may recommend more frequent screening and Pap tests.   - The above recommendations are the same for women who have or have not gotten the vaccine for human papillomavirus (HPV).   - If you had a hysterectomy for a problem that was not cancer or a condition that could lead to cancer, then you no longer need Pap tests. Even if you no longer need a Pap test, a regular exam is a good idea to make sure no other problems are starting.   - If you are between ages 65 and 70 years, and you have had normal Pap tests going back 10 years, you no longer need Pap tests. Even if you no longer need a Pap test, a regular exam is a good idea to make sure no other problems are starting.   - If you have had past treatment for cervical cancer or a condition that could lead to cancer, you need Pap tests and screening for cancer for at least 20 years after your treatment.   - If Pap tests have been discontinued, risk factors (such as a new sexual partner) need to be reassessed to determine if screening should  be resumed.   - The HPV test is an additional test that may be used for cervical cancer screening. The HPV test looks for the virus that can cause the cell changes on the cervix. The cells collected during the Pap test can be tested for HPV. The HPV test could be used to screen women aged 30 years and older, and should be used in women of any age who have unclear Pap test results. After the age of 30, women should have HPV testing at the same frequency as a Pap test.   Colorectal cancer can be detected and often prevented. Most routine colorectal cancer screening begins at the age of 50 years and continues through age 75 years. However, your health care provider may recommend screening at an earlier age if you have risk factors for colon cancer. On a yearly basis, your health care provider may provide home test kits to check for hidden blood in the stool.  Use of a small camera at the end of a tube, to directly examine the colon (sigmoidoscopy or colonoscopy), can detect the earliest forms of colorectal cancer. Talk to your health care provider about this at age 50, when routine screening begins. Direct exam of the colon should be repeated every 5 -10 years through age 75 years, unless early forms of pre-cancerous polyps or small growths are found.   People who are at an increased risk for hepatitis B should be screened for this virus. You are considered at high risk for hepatitis B if:  -You were born in a country where hepatitis B occurs often. Talk with your health care provider about which countries are considered high risk.  - Your parents were born in a high-risk country and you have not received a shot to protect against hepatitis B (hepatitis B vaccine).  - You have HIV or AIDS.  - You use needles to inject street drugs.  - You live with, or have sex with, someone who has Hepatitis B.  - You get hemodialysis treatment.  - You take certain medicines for conditions like cancer, organ  transplantation, and autoimmune conditions.   Hepatitis C blood testing is recommended for all people born from 1945 through 1965 and any individual   with known risks for hepatitis C.   Practice safe sex. Use condoms and avoid high-risk sexual practices to reduce the spread of sexually transmitted infections (STIs). STIs include gonorrhea, chlamydia, syphilis, trichomonas, herpes, HPV, and human immunodeficiency virus (HIV). Herpes, HIV, and HPV are viral illnesses that have no cure. They can result in disability, cancer, and death. Sexually active women aged 25 years and younger should be checked for chlamydia. Older women with new or multiple partners should also be tested for chlamydia. Testing for other STIs is recommended if you are sexually active and at increased risk.   Osteoporosis is a disease in which the bones lose minerals and strength with aging. This can result in serious bone fractures or breaks. The risk of osteoporosis can be identified using a bone density scan. Women ages 65 years and over and women at risk for fractures or osteoporosis should discuss screening with their health care providers. Ask your health care provider whether you should take a calcium supplement or vitamin D to There are also several preventive steps women can take to avoid osteoporosis and resulting fractures or to keep osteoporosis from worsening. -->Recommendations include:  Eat a balanced diet high in fruits, vegetables, calcium, and vitamins.  Get enough calcium. The recommended total intake of is 1,200 mg daily; for best absorption, if taking supplements, divide doses into 250-500 mg doses throughout the day. Of the two types of calcium, calcium carbonate is best absorbed when taken with food but calcium citrate can be taken on an empty stomach.  Get enough vitamin D. NAMS and the National Osteoporosis Foundation recommend at least 1,000 IU per day for women age 50 and over who are at risk of vitamin D  deficiency. Vitamin D deficiency can be caused by inadequate sun exposure (for example, those who live in northern latitudes).  Avoid alcohol and smoking. Heavy alcohol intake (more than 7 drinks per week) increases the risk of falls and hip fracture and women smokers tend to lose bone more rapidly and have lower bone mass than nonsmokers. Stopping smoking is one of the most important changes women can make to improve their health and decrease risk for disease.  Be physically active every day. Weight-bearing exercise (for example, fast walking, hiking, jogging, and weight training) may strengthen bones or slow the rate of bone loss that comes with aging. Balancing and muscle-strengthening exercises can reduce the risk of falling and fracture.  Consider therapeutic medications. Currently, several types of effective drugs are available. Healthcare providers can recommend the type most appropriate for each woman.  Eliminate environmental factors that may contribute to accidents. Falls cause nearly 90% of all osteoporotic fractures, so reducing this risk is an important bone-health strategy. Measures include ample lighting, removing obstructions to walking, using nonskid rugs on floors, and placing mats and/or grab bars in showers.  Be aware of medication side effects. Some common medicines make bones weaker. These include a type of steroid drug called glucocorticoids used for arthritis and asthma, some antiseizure drugs, certain sleeping pills, treatments for endometriosis, and some cancer drugs. An overactive thyroid gland or using too much thyroid hormone for an underactive thyroid can also be a problem. If you are taking these medicines, talk to your doctor about what you can do to help protect your bones.reduce the rate of osteoporosis.    Menopause can be associated with physical symptoms and risks. Hormone replacement therapy is available to decrease symptoms and risks. You should talk to your  health care provider   about whether hormone replacement therapy is right for you.   Use sunscreen. Apply sunscreen liberally and repeatedly throughout the day. You should seek shade when your shadow is shorter than you. Protect yourself by wearing long sleeves, pants, a wide-brimmed hat, and sunglasses year round, whenever you are outdoors.   Once a month, do a whole body skin exam, using a mirror to look at the skin on your back. Tell your health care provider of new moles, moles that have irregular borders, moles that are larger than a pencil eraser, or moles that have changed in shape or color.   -Stay current with required vaccines (immunizations).   Influenza vaccine. All adults should be immunized every year.  Tetanus, diphtheria, and acellular pertussis (Td, Tdap) vaccine. Pregnant women should receive 1 dose of Tdap vaccine during each pregnancy. The dose should be obtained regardless of the length of time since the last dose. Immunization is preferred during the 27th 36th week of gestation. An adult who has not previously received Tdap or who does not know her vaccine status should receive 1 dose of Tdap. This initial dose should be followed by tetanus and diphtheria toxoids (Td) booster doses every 10 years. Adults with an unknown or incomplete history of completing a 3-dose immunization series with Td-containing vaccines should begin or complete a primary immunization series including a Tdap dose. Adults should receive a Td booster every 10 years.  Varicella vaccine. An adult without evidence of immunity to varicella should receive 2 doses or a second dose if she has previously received 1 dose. Pregnant females who do not have evidence of immunity should receive the first dose after pregnancy. This first dose should be obtained before leaving the health care facility. The second dose should be obtained 4 8 weeks after the first dose.  Human papillomavirus (HPV) vaccine. Females aged 13 26  years who have not received the vaccine previously should obtain the 3-dose series. The vaccine is not recommended for use in pregnant females. However, pregnancy testing is not needed before receiving a dose. If a female is found to be pregnant after receiving a dose, no treatment is needed. In that case, the remaining doses should be delayed until after the pregnancy. Immunization is recommended for any person with an immunocompromised condition through the age of 26 years if she did not get any or all doses earlier. During the 3-dose series, the second dose should be obtained 4 8 weeks after the first dose. The third dose should be obtained 24 weeks after the first dose and 16 weeks after the second dose.  Zoster vaccine. One dose is recommended for adults aged 60 years or older unless certain conditions are present.  Measles, mumps, and rubella (MMR) vaccine. Adults born before 1957 generally are considered immune to measles and mumps. Adults born in 1957 or later should have 1 or more doses of MMR vaccine unless there is a contraindication to the vaccine or there is laboratory evidence of immunity to each of the three diseases. A routine second dose of MMR vaccine should be obtained at least 28 days after the first dose for students attending postsecondary schools, health care workers, or international travelers. People who received inactivated measles vaccine or an unknown type of measles vaccine during 1963 1967 should receive 2 doses of MMR vaccine. People who received inactivated mumps vaccine or an unknown type of mumps vaccine before 1979 and are at high risk for mumps infection should consider immunization with 2 doses of   MMR vaccine. For females of childbearing age, rubella immunity should be determined. If there is no evidence of immunity, females who are not pregnant should be vaccinated. If there is no evidence of immunity, females who are pregnant should delay immunization until after pregnancy.  Unvaccinated health care workers born before 1957 who lack laboratory evidence of measles, mumps, or rubella immunity or laboratory confirmation of disease should consider measles and mumps immunization with 2 doses of MMR vaccine or rubella immunization with 1 dose of MMR vaccine.  Pneumococcal 13-valent conjugate (PCV13) vaccine. When indicated, a person who is uncertain of her immunization history and has no record of immunization should receive the PCV13 vaccine. An adult aged 19 years or older who has certain medical conditions and has not been previously immunized should receive 1 dose of PCV13 vaccine. This PCV13 should be followed with a dose of pneumococcal polysaccharide (PPSV23) vaccine. The PPSV23 vaccine dose should be obtained at least 8 weeks after the dose of PCV13 vaccine. An adult aged 19 years or older who has certain medical conditions and previously received 1 or more doses of PPSV23 vaccine should receive 1 dose of PCV13. The PCV13 vaccine dose should be obtained 1 or more years after the last PPSV23 vaccine dose.  Pneumococcal polysaccharide (PPSV23) vaccine. When PCV13 is also indicated, PCV13 should be obtained first. All adults aged 65 years and older should be immunized. An adult younger than age 65 years who has certain medical conditions should be immunized. Any person who resides in a nursing home or long-term care facility should be immunized. An adult smoker should be immunized. People with an immunocompromised condition and certain other conditions should receive both PCV13 and PPSV23 vaccines. People with human immunodeficiency virus (HIV) infection should be immunized as soon as possible after diagnosis. Immunization during chemotherapy or radiation therapy should be avoided. Routine use of PPSV23 vaccine is not recommended for American Indians, [place] Natives, or people younger than 65 years unless there are medical conditions that require PPSV23 vaccine. When indicated,  people who have unknown immunization and have no record of immunization should receive PPSV23 vaccine. One-time revaccination 5 years after the first dose of PPSV23 is recommended for people aged 19 64 years who have chronic kidney failure, nephrotic syndrome, asplenia, or immunocompromised conditions. People who received 1 2 doses of PPSV23 before age 65 years should receive another dose of PPSV23 vaccine at age 65 years or later if at least 5 years have passed since the previous dose. Doses of PPSV23 are not needed for people immunized with PPSV23 at or after age 65 years.  Meningococcal vaccine. Adults with asplenia or persistent complement component deficiencies should receive 2 doses of quadrivalent meningococcal conjugate (MenACWY-D) vaccine. The doses should be obtained at least 2 months apart. Microbiologists working with certain meningococcal bacteria, military recruits, people at risk during an outbreak, and people who travel to or live in countries with a high rate of meningitis should be immunized. A first-year college student up through age 21 years who is living in a residence hall should receive a dose if she did not receive a dose on or after her 16th birthday. Adults who have certain high-risk conditions should receive one or more doses of vaccine.  Hepatitis A vaccine. Adults who wish to be protected from this disease, have certain high-risk conditions, work with hepatitis A-infected animals, work in hepatitis A research labs, or travel to or work in countries with a high rate of hepatitis A should be   immunized. Adults who were previously unvaccinated and who anticipate close contact with an international adoptee during the first 60 days after arrival in the [place] from a country with a high rate of hepatitis A should be immunized.  Hepatitis B vaccine.  Adults who wish to be protected from this disease, have certain high-risk conditions, may be exposed to blood or other infectious  body fluids, are household contacts or sex partners of hepatitis B positive people, are clients or workers in certain care facilities, or travel to or work in countries with a high rate of hepatitis B should be immunized.  Haemophilus influenzae type b (Hib) vaccine. A previously unvaccinated person with asplenia or sickle cell disease or having a scheduled splenectomy should receive 1 dose of Hib vaccine. Regardless of previous immunization, a recipient of a hematopoietic stem cell transplant should receive a 3-dose series 6 12 months after her successful transplant. Hib vaccine is not recommended for adults with HIV infection.  Preventive Services / Frequency Ages 19 to 39years  Blood pressure check.** / Every 1 to 2 years.  Lipid and cholesterol check.** / Every 5 years beginning at age 20.  Clinical breast exam.** / Every 3 years for women in their 20s and 30s.  BRCA-related cancer risk assessment.** / For women who have family members with a BRCA-related cancer (breast, ovarian, tubal, or peritoneal cancers).  Pap test.** / Every 2 years from ages 21 through 29. Every 3 years starting at age 30 through age 65 or 70 with a history of 3 consecutive normal Pap tests.  HPV screening.** / Every 3 years from ages 30 through ages 65 to 70 with a history of 3 consecutive normal Pap tests.  Hepatitis C blood test.** / For any individual with known risks for hepatitis C.  Skin self-exam. / Monthly.  Influenza vaccine. / Every year.  Tetanus, diphtheria, and acellular pertussis (Tdap, Td) vaccine.** / Consult your health care provider. Pregnant women should receive 1 dose of Tdap vaccine during each pregnancy. 1 dose of Td every 10 years.  Varicella vaccine.** / Consult your health care provider. Pregnant females who do not have evidence of immunity should receive the first dose after pregnancy.  HPV vaccine. / 3 doses over 6 months, if 26 and younger. The vaccine is not recommended for use in  pregnant females. However, pregnancy testing is not needed before receiving a dose.  Measles, mumps, rubella (MMR) vaccine.** / You need at least 1 dose of MMR if you were born in 1957 or later. You may also need a 2nd dose. For females of childbearing age, rubella immunity should be determined. If there is no evidence of immunity, females who are not pregnant should be vaccinated. If there is no evidence of immunity, females who are pregnant should delay immunization until after pregnancy.  Pneumococcal 13-valent conjugate (PCV13) vaccine.** / Consult your health care provider.  Pneumococcal polysaccharide (PPSV23) vaccine.** / 1 to 2 doses if you smoke cigarettes or if you have certain conditions.  Meningococcal vaccine.** / 1 dose if you are age 19 to 21 years and a first-year college student living in a residence hall, or have one of several medical conditions, you need to get vaccinated against meningococcal disease. You may also need additional booster doses.  Hepatitis A vaccine.** / Consult your health care provider.  Hepatitis B vaccine.** / Consult your health care provider.  Haemophilus influenzae type b (Hib) vaccine.** / Consult your health care provider.  Ages 40 to 64years    Blood pressure check.** / Every 1 to 2 years.  Lipid and cholesterol check.** / Every 5 years beginning at age 20 years.  Lung cancer screening. / Every year if you are aged 55 80 years and have a 30-pack-year history of smoking and currently smoke or have quit within the past 15 years. Yearly screening is stopped once you have quit smoking for at least 15 years or develop a health problem that would prevent you from having lung cancer treatment.  Clinical breast exam.** / Every year after age 40 years.  BRCA-related cancer risk assessment.** / For women who have family members with a BRCA-related cancer (breast, ovarian, tubal, or peritoneal cancers).  Mammogram.** / Every year beginning at age 40  years and continuing for as long as you are in good health. Consult with your health care provider.  Pap test.** / Every 3 years starting at age 30 years through age 65 or 70 years with a history of 3 consecutive normal Pap tests.  HPV screening.** / Every 3 years from ages 30 years through ages 65 to 70 years with a history of 3 consecutive normal Pap tests.  Fecal occult blood test (FOBT) of stool. / Every year beginning at age 50 years and continuing until age 75 years. You may not need to do this test if you get a colonoscopy every 10 years.  Flexible sigmoidoscopy or colonoscopy.** / Every 5 years for a flexible sigmoidoscopy or every 10 years for a colonoscopy beginning at age 50 years and continuing until age 75 years.  Hepatitis C blood test.** / For all people born from 1945 through 1965 and any individual with known risks for hepatitis C.  Skin self-exam. / Monthly.  Influenza vaccine. / Every year.  Tetanus, diphtheria, and acellular pertussis (Tdap/Td) vaccine.** / Consult your health care provider. Pregnant women should receive 1 dose of Tdap vaccine during each pregnancy. 1 dose of Td every 10 years.  Varicella vaccine.** / Consult your health care provider. Pregnant females who do not have evidence of immunity should receive the first dose after pregnancy.  Zoster vaccine.** / 1 dose for adults aged 60 years or older.  Measles, mumps, rubella (MMR) vaccine.** / You need at least 1 dose of MMR if you were born in 1957 or later. You may also need a 2nd dose. For females of childbearing age, rubella immunity should be determined. If there is no evidence of immunity, females who are not pregnant should be vaccinated. If there is no evidence of immunity, females who are pregnant should delay immunization until after pregnancy.  Pneumococcal 13-valent conjugate (PCV13) vaccine.** / Consult your health care provider.  Pneumococcal polysaccharide (PPSV23) vaccine.** / 1 to 2 doses if  you smoke cigarettes or if you have certain conditions.  Meningococcal vaccine.** / Consult your health care provider.  Hepatitis A vaccine.** / Consult your health care provider.  Hepatitis B vaccine.** / Consult your health care provider.  Haemophilus influenzae type b (Hib) vaccine.** / Consult your health care provider.  Ages 65 years and over  Blood pressure check.** / Every 1 to 2 years.  Lipid and cholesterol check.** / Every 5 years beginning at age 20 years.  Lung cancer screening. / Every year if you are aged 55 80 years and have a 30-pack-year history of smoking and currently smoke or have quit within the past 15 years. Yearly screening is stopped once you have quit smoking for at least 15 years or develop a health problem that   would prevent you from having lung cancer treatment.  Clinical breast exam.** / Every year after age 40 years.  BRCA-related cancer risk assessment.** / For women who have family members with a BRCA-related cancer (breast, ovarian, tubal, or peritoneal cancers).  Mammogram.** / Every year beginning at age 40 years and continuing for as long as you are in good health. Consult with your health care provider.  Pap test.** / Every 3 years starting at age 30 years through age 65 or 70 years with 3 consecutive normal Pap tests. Testing can be stopped between 65 and 70 years with 3 consecutive normal Pap tests and no abnormal Pap or HPV tests in the past 10 years.  HPV screening.** / Every 3 years from ages 30 years through ages 65 or 70 years with a history of 3 consecutive normal Pap tests. Testing can be stopped between 65 and 70 years with 3 consecutive normal Pap tests and no abnormal Pap or HPV tests in the past 10 years.  Fecal occult blood test (FOBT) of stool. / Every year beginning at age 50 years and continuing until age 75 years. You may not need to do this test if you get a colonoscopy every 10 years.  Flexible sigmoidoscopy or colonoscopy.** /  Every 5 years for a flexible sigmoidoscopy or every 10 years for a colonoscopy beginning at age 50 years and continuing until age 75 years.  Hepatitis C blood test.** / For all people born from 1945 through 1965 and any individual with known risks for hepatitis C.  Osteoporosis screening.** / A one-time screening for women ages 65 years and over and women at risk for fractures or osteoporosis.  Skin self-exam. / Monthly.  Influenza vaccine. / Every year.  Tetanus, diphtheria, and acellular pertussis (Tdap/Td) vaccine.** / 1 dose of Td every 10 years.  Varicella vaccine.** / Consult your health care provider.  Zoster vaccine.** / 1 dose for adults aged 60 years or older.  Pneumococcal 13-valent conjugate (PCV13) vaccine.** / Consult your health care provider.  Pneumococcal polysaccharide (PPSV23) vaccine.** / 1 dose for all adults aged 65 years and older.  Meningococcal vaccine.** / Consult your health care provider.  Hepatitis A vaccine.** / Consult your health care provider.  Hepatitis B vaccine.** / Consult your health care provider.  Haemophilus influenzae type b (Hib) vaccine.** / Consult your health care provider. ** Family history and personal history of risk and conditions may change your health care provider's recommendations. Document Released: 08/01/2001 Document Revised: 03/26/2013  [employer] Patient Information 2014 [employer]   EXERCISE AND DIET:  We recommended that you start or continue a regular exercise program for good health. Regular exercise means any activity that makes your heart beat faster and makes you sweat.  We recommend exercising at least 30 minutes per day at least 3 days a week, preferably 5.  We also recommend a diet low in fat and sugar / carbohydrates.  Inactivity, poor dietary choices and obesity can cause diabetes, heart attack, stroke, and kidney damage, among others.     ALCOHOL AND SMOKING:  Women should limit their alcohol intake to no  more than 7 drinks/beers/glasses of wine (combined, not each!) per week. Moderation of alcohol intake to this level decreases your risk of breast cancer and liver damage.  ( And of course, no recreational drugs are part of a healthy lifestyle.)  Also, you should not be smoking at all or even being exposed to second hand smoke. Most people know smoking can   cause cancer, and various heart and lung diseases, but did you know it also contributes to weakening of your bones?  Aging of your skin?  Yellowing of your teeth and nails?   CALCIUM AND VITAMIN D:  Adequate intake of calcium and Vitamin D are recommended.  The recommendations for exact amounts of these supplements seem to change often, but generally speaking 600 mg of calcium (either carbonate or citrate) and 800 units of Vitamin D per day seems prudent. Certain women may benefit from higher intake of Vitamin D.  If you are among these women, your doctor will have told you during your visit.     PAP SMEARS:  Pap smears, to check for cervical cancer or precancers,  have traditionally been done yearly, although recent scientific advances have shown that most women can have pap smears less often.  However, every woman still should have a physical exam from her gynecologist or primary care physician every year. It will include a breast check, inspection of the vulva and vagina to check for abnormal growths or skin changes, a visual exam of the cervix, and then an exam to evaluate the size and shape of the uterus and ovaries.  And after 61 years of age, a rectal exam is indicated to check for rectal cancers. We will also provide age appropriate advice regarding health maintenance, like when you should have certain vaccines, screening for sexually transmitted diseases, bone density testing, colonoscopy, mammograms, etc.    MAMMOGRAMS:  All women over 40 years old should have a yearly mammogram. Many facilities now offer a "3D" mammogram, which may cost  around $50 extra out of pocket. If possible,  we recommend you accept the option to have the 3D mammogram performed.  It both reduces the number of women who will be called back for extra views which then turn out to be normal, and it is better than the routine mammogram at detecting truly abnormal areas.     COLONOSCOPY:  Colonoscopy to screen for colon cancer is recommended for all women at age 50.  We know, you hate the idea of the prep.  We agree, BUT, having colon cancer and not knowing it is worse!!  Colon cancer so often starts as a polyp that can be seen and removed at colonscopy, which can quite literally save your life!  And if your first colonoscopy is normal and you have no family history of colon cancer, most women don't have to have it again for 10 years.  Once every ten years, you can do something that may end up saving your life, right?  We will be happy to help you get it scheduled when you are ready.  Be sure to check your insurance coverage so you understand how much it will cost.  It may be covered as a preventative service at no cost, but you should check your particular policy.   

## 2019-06-25 LAB — COMPREHENSIVE METABOLIC PANEL
ALT: 38 IU/L — ABNORMAL HIGH (ref 0–32)
AST: 28 IU/L (ref 0–40)
Albumin/Globulin Ratio: 1.7 (ref 1.2–2.2)
Albumin: 4.4 g/dL (ref 3.8–4.9)
Alkaline Phosphatase: 45 IU/L (ref 39–117)
BUN/Creatinine Ratio: 21 (ref 12–28)
BUN: 16 mg/dL (ref 8–27)
Bilirubin Total: 0.8 mg/dL (ref 0.0–1.2)
CO2: 26 mmol/L (ref 20–29)
Calcium: 9.4 mg/dL (ref 8.7–10.3)
Chloride: 104 mmol/L (ref 96–106)
Creatinine, Ser: 0.77 mg/dL (ref 0.57–1.00)
GFR calc Af Amer: 97 mL/min/{1.73_m2} (ref >59)
GFR calc non Af Amer: 84 mL/min/{1.73_m2} (ref >59)
Globulin, Total: 2.6 g/dL (ref 1.5–4.5)
Glucose: 97 mg/dL (ref 65–99)
Potassium: 4.2 mmol/L (ref 3.5–5.2)
Sodium: 141 mmol/L (ref 134–144)
Total Protein: 7 g/dL (ref 6.0–8.5)

## 2019-06-25 LAB — CBC WITH DIFFERENTIAL/PLATELET
Basophils Absolute: 0 10*3/uL (ref 0.0–0.2)
Basos: 0 %
EOS (ABSOLUTE): 0 10*3/uL (ref 0.0–0.4)
Eos: 0 %
Hematocrit: 39.3 % (ref 34.0–46.6)
Hemoglobin: 13.4 g/dL (ref 11.1–15.9)
Immature Grans (Abs): 0 10*3/uL (ref 0.0–0.1)
Immature Granulocytes: 1 %
Lymphocytes Absolute: 1.4 10*3/uL (ref 0.7–3.1)
Lymphs: 45 %
MCH: 30.9 pg (ref 26.6–33.0)
MCHC: 34.1 g/dL (ref 31.5–35.7)
MCV: 91 fL (ref 79–97)
Monocytes Absolute: 0.2 10*3/uL (ref 0.1–0.9)
Monocytes: 5 %
Neutrophils Absolute: 1.4 10*3/uL (ref 1.4–7.0)
Neutrophils: 49 %
Platelets: 166 10*3/uL (ref 150–450)
RBC: 4.34 x10E6/uL (ref 3.77–5.28)
RDW: 13.9 % (ref 11.7–15.4)
WBC: 3 10*3/uL — ABNORMAL LOW (ref 3.4–10.8)

## 2019-06-25 LAB — LIPID PANEL
Chol/HDL Ratio: 3.1 ratio (ref 0.0–4.4)
Cholesterol, Total: 184 mg/dL (ref 100–199)
HDL: 60 mg/dL (ref >39)
LDL Chol Calc (NIH): 114 mg/dL — ABNORMAL HIGH (ref 0–99)
Triglycerides: 50 mg/dL (ref 0–149)
VLDL Cholesterol Cal: 10 mg/dL (ref 5–40)

## 2019-06-25 LAB — TSH: TSH: 1.42 u[IU]/mL (ref 0.450–4.500)

## 2019-06-25 LAB — HEMOGLOBIN A1C
Est. average glucose Bld gHb Est-mCnc: 114 mg/dL
Hgb A1c MFr Bld: 5.6 % (ref 4.8–5.6)

## 2019-06-25 LAB — VITAMIN D 25 HYDROXY (VIT D DEFICIENCY, FRACTURES): Vit D, 25-Hydroxy: 40.3 ng/mL (ref 30.0–100.0)

## 2019-06-25 LAB — T4, FREE: Free T4: 1.06 ng/dL (ref 0.82–1.77)

## 2019-06-26 ENCOUNTER — Other Ambulatory Visit: Payer: Self-pay

## 2019-06-26 ENCOUNTER — Ambulatory Visit (INDEPENDENT_AMBULATORY_CARE_PROVIDER_SITE_OTHER): Payer: No Typology Code available for payment source | Admitting: Family Medicine

## 2019-06-26 ENCOUNTER — Encounter: Payer: Self-pay | Admitting: Family Medicine

## 2019-06-26 ENCOUNTER — Telehealth: Payer: Self-pay | Admitting: Cardiovascular Disease

## 2019-06-26 VITALS — BP 142/78

## 2019-06-26 DIAGNOSIS — E669 Obesity, unspecified: Secondary | ICD-10-CM

## 2019-06-26 DIAGNOSIS — Z1331 Encounter for screening for depression: Secondary | ICD-10-CM

## 2019-06-26 DIAGNOSIS — E785 Hyperlipidemia, unspecified: Secondary | ICD-10-CM | POA: Diagnosis not present

## 2019-06-26 DIAGNOSIS — R7303 Prediabetes: Secondary | ICD-10-CM

## 2019-06-26 DIAGNOSIS — I10 Essential (primary) hypertension: Secondary | ICD-10-CM | POA: Diagnosis not present

## 2019-06-26 DIAGNOSIS — Z719 Counseling, unspecified: Secondary | ICD-10-CM

## 2019-06-26 DIAGNOSIS — Z8249 Family history of ischemic heart disease and other diseases of the circulatory system: Secondary | ICD-10-CM

## 2019-06-26 NOTE — Patient Instructions (Signed)

## 2019-06-26 NOTE — Telephone Encounter (Signed)
Left voicemail message to call back  

## 2019-06-26 NOTE — Telephone Encounter (Signed)
Patient calling in regarding cholesterol issues. States her pcp has stated patient has had cholesterol problems for the past 3 years. Patient is upset and alarmed that this was never discussed with her. Patient also stated her PCP said that she believes the cardiologist should prescribe medication for this concern   Please advise as soon as possible

## 2019-06-26 NOTE — Progress Notes (Signed)
Telehealth office visit note for Nicole Gould, D.O- at Primary Care at [facility]   I connected with current patient today and verified that I am speaking with the correct person using two identifiers.   . Location of the patient: Home . Location of the provider: Office Only the patient (+/- their family members at pt's discretion) and myself were participating in the encounter - This visit type was conducted due to national recommendations for restrictions regarding the COVID-19 Pandemic (e.g. social distancing) in an effort to limit this patient's exposure and mitigate transmission in our community.  This format is felt to be most appropriate for this patient at this time.   - The patient did not have access to video technology or had technical difficulties with video requiring transitioning to audio format only. - No physical exam could be performed with this format, beyond that communicated to [place] by the patient/ family members as noted.   - Additionally my office staff/ schedulers discussed with the patient that there may be a monetary charge related to this service, depending on their medical insurance.   The patient expressed understanding, and agreed to proceed.       History of Present Illness: No chief complaint on file.   Nicole Gould, am serving as scribe for Dr. Mellody Gould.  At start of appointment, patient states her main concerns are monitoring her kidneys and liver, especially as she takes HCTZ.  - Dermatological Concern Notes she is still concerned about the "weird itching / burning sensation" between her shoulders, but she has an appointment scheduled with dermatology next Wednesday to speak of this.  HPI:  Hyperlipidemia:  59 y.o. female here for cholesterol follow-up.   Notes she has been exercising a lot lately, but due to the holidays, over the last 6 months she's been eating things she normally doesn't eat.  Says she is getting back on the  wagon, plans to exercise more and eat better.  - Patient denies any acute concerns or problems with management plan   - She denies new onset of: myalgias, arthralgias, increased fatigue more than normal, chest pains, exercise intolerance, shortness of breath, dizziness, visual changes, headache, lower extremity swelling or claudication.   Most recent cholesterol panel was:  Lab Results  Component Value Date   CHOL 184 06/24/2019   HDL 60 06/24/2019   LDLCALC 114 (H) 06/24/2019   LDLDIRECT 113.0 09/16/2014   TRIG 50 06/24/2019   CHOLHDL 3.1 06/24/2019   Hepatic Function Latest Ref Rng & Units 06/24/2019 04/19/2018 03/16/2017  Total Protein 6.0 - 8.5 g/dL 7.0 6.7 8.1  Albumin 3.8 - 4.9 g/dL 4.4 3.9 4.2  AST 0 - 40 IU/L '28 22 27  '$ ALT 0 - 32 IU/L 38(H) 20 23  Alk Phosphatase 39 - 117 IU/L 45 42 46  Total Bilirubin 0.0 - 1.2 mg/dL 0.8 0.7 0.7  Bilirubin, Direct 0.0 - 0.3 mg/dL - - -      GAD 7 : Generalized Anxiety Score 08/07/2018 06/06/2018  Nervous, Anxious, on Edge 0 1  Control/stop worrying 1 1  Worry too much - different things 0 0  Trouble relaxing 1 1  Restless 0 0  Easily annoyed or irritable 1 1  Afraid - awful might happen 0 1  Total GAD 7 Score 3 5  Anxiety Difficulty - Somewhat difficult    Depression screen [facility] 2/9 06/26/2019 06/24/2019 05/06/2019 08/07/2018 06/06/2018  Decreased Interest 0 0 0 0 -  Down, Depressed, Hopeless 0 0 0 0 1  PHQ - 2 Score 0 0 0 0 1  Altered sleeping 0 0 0 1 1  Tired, decreased energy 0 '1 1 1 1  '$ Change in appetite 0 0 0 0 -  Feeling bad or failure about yourself  0 0 0 0 0  Trouble concentrating 0 2 0 1 1  Moving slowly or fidgety/restless 0 0 0 0 0  Suicidal thoughts 0 0 0 0 0  PHQ-9 Score 0 '3 1 3 4  '$ Difficult doing work/chores - Not difficult at all Not difficult at all Somewhat difficult Somewhat difficult    Recent Results (from the past 2160 hour(s))  VITAMIN D 25 Hydroxy (Vit-D Deficiency, Fractures)     Status: None    Collection Time: 06/24/19  9:06 AM  Result Value Ref Range   Vit D, 25-Hydroxy 40.3 30.0 - 100.0 ng/mL    Comment: Vitamin D deficiency has been defined by the Institute of Medicine and an Endocrine Society practice guideline as a level of serum 25-OH vitamin D less than 20 ng/mL (1,2). The Endocrine Society went on to further define vitamin D insufficiency as a level between 21 and 29 ng/mL (2). 1. IOM (Institute of Medicine). 2010. Dietary reference    intakes for calcium and D. [place]: The    [employer]. 2. Holick MF, Binkley Elsah, Bischoff-Ferrari HA, et al.    Evaluation, treatment, and prevention of vitamin D    deficiency: an Endocrine Society clinical practice    guideline. JCEM. 2011 Jul; 96(7):1911-30.   Hemoglobin A1c     Status: None   Collection Time: 06/24/19  9:06 AM  Result Value Ref Range   Hgb A1c MFr Bld 5.6 4.8 - 5.6 %    Comment:          Prediabetes: 5.7 - 6.4          Diabetes: >6.4          Glycemic control for adults with diabetes: <7.0    Est. average glucose Bld gHb Est-mCnc 114 mg/dL  Lipid panel     Status: Abnormal   Collection Time: 06/24/19  9:06 AM  Result Value Ref Range   Cholesterol, Total 184 100 - 199 mg/dL   Triglycerides 50 0 - 149 mg/dL   HDL 60 >39 mg/dL   VLDL Cholesterol Cal 10 5 - 40 mg/dL   LDL Chol Calc (NIH) 114 (H) 0 - 99 mg/dL   Chol/HDL Ratio 3.1 0.0 - 4.4 ratio    Comment:                                   T. Chol/HDL Ratio                                             Men  Women                               1/2 Avg.Risk  3.4    3.3                                   Avg.Risk  5.0  4.4                                2X Avg.Risk  9.6    7.1                                3X Avg.Risk 23.4   11.0   T4, free     Status: None   Collection Time: 06/24/19  9:06 AM  Result Value Ref Range   Free T4 1.06 0.82 - 1.77 ng/dL  TSH     Status: None   Collection Time: 06/24/19  9:06 AM  Result Value Ref Range    TSH 1.420 0.450 - 4.500 uIU/mL  CMP (comprehensive metabolic panel)     Status: Abnormal   Collection Time: 06/24/19  9:06 AM  Result Value Ref Range   Glucose 97 65 - 99 mg/dL   BUN 16 8 - 27 mg/dL   Creatinine, Ser 0.77 0.57 - 1.00 mg/dL   GFR calc non Af Amer 84 >59 mL/min/1.73   GFR calc Af Amer 97 >59 mL/min/1.73   BUN/Creatinine Ratio 21 12 - 28   Sodium 141 134 - 144 mmol/L   Potassium 4.2 3.5 - 5.2 mmol/L   Chloride 104 96 - 106 mmol/L   CO2 26 20 - 29 mmol/L   Calcium 9.4 8.7 - 10.3 mg/dL   Total Protein 7.0 6.0 - 8.5 g/dL   Albumin 4.4 3.8 - 4.9 g/dL   Globulin, Total 2.6 1.5 - 4.5 g/dL   Albumin/Globulin Ratio 1.7 1.2 - 2.2   Bilirubin Total 0.8 0.0 - 1.2 mg/dL   Alkaline Phosphatase 45 39 - 117 IU/L   AST 28 0 - 40 IU/L   ALT 38 (H) 0 - 32 IU/L  CBC w/Diff     Status: Abnormal   Collection Time: 06/24/19  9:06 AM  Result Value Ref Range   WBC 3.0 (L) 3.4 - 10.8 x10E3/uL   RBC 4.34 3.77 - 5.28 x10E6/uL   Hemoglobin 13.4 11.1 - 15.9 g/dL   Hematocrit 39.3 34.0 - 46.6 %   MCV 91 79 - 97 fL   MCH 30.9 26.6 - 33.0 pg   MCHC 34.1 31.5 - 35.7 g/dL   RDW 13.9 11.7 - 15.4 %   Platelets 166 150 - 450 x10E3/uL   Neutrophils 49 Not Estab. %   Lymphs 45 Not Estab. %   Monocytes 5 Not Estab. %   Eos 0 Not Estab. %   Basos 0 Not Estab. %   Neutrophils Absolute 1.4 1.4 - 7.0 x10E3/uL   Lymphocytes Absolute 1.4 0.7 - 3.1 x10E3/uL   Monocytes Absolute 0.2 0.1 - 0.9 x10E3/uL   EOS (ABSOLUTE) 0.0 0.0 - 0.4 x10E3/uL   Basophils Absolute 0.0 0.0 - 0.2 x10E3/uL   Immature Granulocytes 1 Not Estab. %   Immature Grans (Abs) 0.0 0.0 - 0.1 x10E3/uL     Impression and Recommendations:    1. Essential hypertension, benign   2. Hyperlipidemia, unspecified hyperlipidemia type   3. Prediabetes   4. Obesity with body mass index 30 or greater   5. Health education/counseling       - Reviewed recent lab work (06/24/2019) in depth with patient today.  All lab work within normal  limits unless otherwise noted.  Extensive education provided and all questions answered.  CBC - Discussed that patient's  WBC last measured at 3.0, which is technically low normal, and that this value is considered stable from last check 2.8, and 3.1 one year prior.  - Will continue to monitor.  History of Prediabetes - A1c down to 5.6 from 5.7 prior, stable.  - Counseled patient on prevention of diabetes and discussed dietary and lifestyle modification as first line.   - Importance of low carb, heart-healthy diet discussed with patient in addition to regular aerobic exercise of 20mn 5d/week or more.   - Encouraged patient to keep her BMI under 30 to help prevent onset of diabetes.  - We will continue to monitor and re-check as discussed.  Vitamin D Deficiency - Last checked at 40.3, up from 36.5 one year prior. - Discussed goal maintenance in 40-60 range. - Patient will continue supplementation as established.  - Will continue to monitor and re-check as discussed.  Hyperlipidemia -  Monitored by Cardiology - Triglycerides = 50, down from 54 prior. - LDL = 114, elevated, up from 98 one year ago, 108 three years ago - HDL = 60, up from 52 prior  The 10-year ASCVD risk score (Mikey BussingDC Jr., et al., 2013) is: 18.3%  - Discussed that patient's LDL has been elevated for the past three years, poorly controlled. - Advised patient of need to begin cholesterol medication to help lower ASCVD risk.  - Patient will make cardiologist aware of recent LDL and speak with cardiology regarding starting cholesterol medication and all changes to treatment plan.  Patient agrees to call cardiologist today.  - For further assistance, prudent dietary changes such as low saturated & trans fat diets for hyperlipidemia and low carb diets for hypertriglyceridemia discussed with patient.    - To help improve HDL and reduce LDL, encouraged patient to follow AHA guidelines for regular exercise and also engage  in weight loss if BMI above 25.  - We will continue to monitor alongside cardiology and re-check as discussed.  Essential Hypertension - Monitored by Cardiology - Blood pressure slightly elevated on intake today. - Discussed need to maintain BP at goal as discussed.  - Patient will continue current treatment regimen and continue close monitoring at home.  If BP at home is poorly controlled, patient knows to call cardiologist and obtain adjustments to treatment plan as advised.  - Counseled patient on pathophysiology of disease and discussed various treatment options, which always includes dietary and lifestyle modification as first line.   - Lifestyle changes such as dash and heart healthy diets and engaging in a regular exercise program discussed extensively with patient.   - Ambulatory blood pressure monitoring encouraged at least 3 times weekly.  Keep log and bring in every office visit.  Reminded patient that if they ever feel poorly in any way, to check their blood pressure and pulse.  - We will continue to monitor alongside cardiology.  Health Education/Counseling, Obesity - Advised patient to continue working toward exercising and prudent weight loss to improve overall mental, physical, and emotional health.    - Encouraged patient to engage in daily physical activity as tolerated, especially a formal exercise routine.  Recommended that the patient eventually strive for at least 150 minutes of moderate cardiovascular activity per week according to guidelines established by the [facility]   - Healthy dietary habits encouraged, including low-carb, and high amounts of lean protein in diet.   - Patient should also consume adequate amounts of water.  - Health counseling performed.  All questions answered.    -  As part of my medical decision making, I reviewed the following data within the [place] History obtained from pt /family, CMA notes reviewed and incorporated if  applicable, Labs reviewed, Radiograph/ tests reviewed if applicable and OV notes from prior OV's with me, as well as other specialists she/he has seen since seeing me last, were all reviewed and used in my medical decision making process today.    - Additionally, discussion had with patient regarding our treatment plan, and their biases/concerns about that plan were used in my medical decision making today.    - The patient agreed with the plan and demonstrated an understanding of the instructions.   No barriers to understanding were identified.    - Red flag symptoms and signs discussed in detail.  Patient expressed understanding regarding what to do in case of emergency\ urgent symptoms.   - The patient was advised to call back or seek an in-person evaluation if the symptoms worsen or if the condition fails to improve as anticipated.   Return for OV 4-6 months, sooner if concerns.    I provided 18+ minutes of non face-to-face time during this encounter.  Additional time was spent with charting and coordination of care before and after the actual visit commenced.   Note:  This note was prepared with assistance of Dragon voice recognition software. Occasional wrong-word or sound-a-like substitutions may have occurred due to the inherent limitations of voice recognition software.  This document serves as a record of services personally performed by Nicole Dance, DO. It was created on her behalf by Toni Amend, a trained medical scribe. The creation of this record is based on the scribe's personal observations and the provider's statements to them.   This case required medical decision making of at least moderate complexity. The above documentation has been reviewed to be accurate and was completed by Marjory Sneddon, D.O.       Patient Care Team    Relationship Specialty Notifications Start End  Nicole Dance, DO PCP - General Family Medicine  02/08/18   Minna Merritts,  MD PCP - Cardiology Cardiology Admissions 06/05/18   Minna Merritts, MD Consulting Physician Cardiology  03/14/13   [name], MD  Internal Medicine  02/08/18   Malon Kindle, MD Referring Physician Neurology  02/08/18   Jerene Bears, MD Consulting Physician Gastroenterology  02/08/18   Jacelyn Pi, MD Consulting Physician Endocrinology  02/08/18   Allyn Kenner, Garden Physician Obstetrics and Gynecology  02/08/18   Allyn Kenner, MD  Dermatology  06/26/19      -Vitals obtained; medications/ allergies reconciled;  personal medical, social, Sx etc.histories were updated by CMA, reviewed by me and are reflected in chart   Patient Active Problem List   Diagnosis Date Noted  . Hyperlipidemia 06/26/2019  . Dysesthesia 06/24/2019  . Dry mouth, unspecified 06/24/2019  . Vaccine counseling 06/24/2019  . Obesity (BMI 30.0-34.9) 05/06/2019  . Allergic rhinitis 05/06/2019  . Insomnia due to stress, anxiety and fear 06/06/2018  . Noncompliance with diet and medication regimen 04/21/2018  . Mood disorder ([place]) 03/07/2018  . Polyp of corpus uteri 02/08/2018  . Hx of seizure disorder 11/10/2017  . Migraine without aura and without status migrainosus, not intractable 11/10/2017  . Prediabetes 11/10/2017  . Spells of trembling 11/10/2017  . Near syncope 10/13/2017  . Sinusitis 07/16/2017  . Edema 04/29/2017  . 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 Dizziness 10/08/2016  . Obesity with body mass index 30 or greater 08/09/2016  . Vaginal discharge 08/09/2016  . History of adenomatous polyp of colon 08/04/2016  . 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 Menopausal symptom 10/25/2014  . Primary insomnia 05/05/2014  . Anxiety 05/05/2014  . Chronic venous insufficiency 04/21/2014  . Adenomatous polyp of colon 05/12/2013  . Visit for preventive health examination 03/14/2013  . Pain  03/11/2013  . Essential hypertension, benign 01/28/2013  . Vulvitis 01/28/2013  . 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 AMBULATORY NON FORMULARY MEDICATION Medication Name: Human Super Beats- 1 teaspoon of Powder prn  . amLODipine (NORVASC) 2.5 MG tablet Take 1 tablet (2.5 mg total) by mouth daily as needed (for high blood pressure).  Marland Kitchen azelastine (ASTELIN) 0.1 % nasal spray Place 1 spray into both nostrils 2 (two) times daily. after sinus rinses  . BYSTOLIC 10 MG tablet TAKE 1 TABLET BY MOUTH EVERY DAY  . Cholecalciferol (VITAMIN D-3) 1000 UNITS CAPS Take 1 capsule by mouth daily.  Marland Kitchen CINNAMON PO Take by mouth.  . fluticasone (FLONASE) 50 MCG/ACT nasal spray Place 1 spray into both nostrils as needed.   . hydrochlorothiazide (HYDRODIURIL) 12.5 MG tablet TAKE 1 TABLET (12.5 MG TOTAL) BY MOUTH AS NEEDED (SBP > 150).  Marland Kitchen MAGNESIUM MALATE PO Take 1 tablet by mouth daily.   . Misc Natural Products (APPLE CIDER VINEGAR DIET PO) [place] Gummies 2 gummies TID.  . Multiple Vitamins-Minerals (MULTIVITAMIN WITH MINERALS) tablet Take 1 tablet by mouth daily.  . Multiple Vitamins-Minerals (ZINC PO) Take by mouth as needed.  . pantoprazole (PROTONIX) 40 MG tablet Take 1 tablet (40 mg total) by mouth 2 (two) times daily before a meal. Pharmacy-please d/c rx for once daily dosing  . Specialty Vitamins Products (VITAMINS FOR THE HAIR PO) Take 1 tablet by mouth daily.     Allergies:  Allergies  Allergen Reactions  . Losartan Shortness Of Breath    Dizziness,  Shortness of breath   . Anesthetics, Amide Nausea And Vomiting  . Benadryl [Diphenhydramine Hcl (Sleep)]     Made feel paralyzed when had benadryl with a cocktail for procedure  Decadron, bendayl, regaln combo at [facility] 2014- see care every where   . Benicar [Olmesartan]     Presyncopal , light headed   . Cardura [Doxazosin Mesylate] Other (See Comments)    "makes me feel terrible"  . Codeine     Other  reaction(s): Unknown  . Fentanyl Hypertension    Severe htn   . Losartan Potassium-Hctz     Dizziness,  Shortness of breath   . Metformin And Related     "out of body experience"    . Prilocaine Nausea And Vomiting     ROS:  See above HPI for pertinent positives and negatives   Objective:   Blood pressure (!) 142/78, last menstrual period 12/01/2013.  (if some vitals are omitted, this means that patient was UNABLE to obtain them even though they were asked to get them prior to [place] today.  They were asked to call us at their earliest convenience with these once obtained. )  General: A & O * 3; sounds in no acute distress; in usual state of health.  Skin: Pt confirms warm and dry extremities and pink fingertips HEENT: Pt confirms lips non-cyanotic Chest: Patient confirms normal chest excursion and movement Respiratory: speaking in full sentences,  no conversational dyspnea; patient confirms no use of accessory muscles Psych: insight appears good, mood- appears full

## 2019-06-28 ENCOUNTER — Other Ambulatory Visit: Payer: Self-pay | Admitting: Cardiovascular Disease

## 2019-06-30 NOTE — Telephone Encounter (Signed)
Patient calling back in to discuss prior situation. Please call back at [phone]  Please call at earliest [facility]

## 2019-06-30 NOTE — Telephone Encounter (Signed)
Patient calling back Wants to make sure Dr Rockey Situ is aware of situation from PCP in regards to high cholesterol  Patient would prefer nurse call with solution to the problem and not call to just discuss issue then call back Please review and call to discuss

## 2019-06-30 NOTE — Telephone Encounter (Signed)
Patient called in to discuss her elevated cholesterol numbers and was extremely upset. She was very anxious with elevated tone and reviewing all of her information. She states that after visiting her PCP that they told her numbers were elevated and she needed treatment for this by her cardiologist. Reviewed with her that PCP normally manages lipids and blood pressures but at times we do as well if they have other cardiac issues. She states that the doctor was supposed to have sent a note to Dr. Rockey Situ for review. I do not see any notes sent to him but then we discussed her lipid panels back to 2017. Let her know that all is normal except for her LDL which is a little elevated. After discussion of this information I did mention CT Cardiac Scoring test which can give [place] some additional information. She was agreeable with this test so order entered and sent her My chart message with the information, number to call, and address so she can have that done. Advised that it can take some time to get results but we would call her once they are done. She verbalized understanding and had no further questions at this time.

## 2019-07-02 NOTE — Progress Notes (Signed)
Cardiology Office Note    Date:  07/07/2019   ID:  Nicole Gould, DOB [birth year], MRN ID:[id number]  PCP:  [name], DO  Cardiologist:  Ida Rogue, MD  Electrophysiologist:  None   Chief Complaint: Follow-up  History of Present Illness:   Nicole Gould is a 59 y.o. female with history of difficult to control hypertension with multiple medication intolerances, chronic dizziness, headache disorder, palpitations, anxiety, and GERD who presents for follow-up of hypertension.  Prior ambulatory blood pressure monitoring in 03/2018 showed an average blood pressure of 146/75 with an average heart rate of 61 bpm.  Average blood pressure during waking hours was 150/78.   Documented medication intolerances include: Cardura - dizziness Amlodipine - lower extremity swelling; headache HCTZ - hypokalemia and dry mouth, with capsule form increased UOP output/cramps Losartan - dizziness and shortness of breath; malaise Lisinopril - cough Benicar - lightheadedness and presyncope Spironolactone - breast fullness Eplerenone - breast tenderness   She was previously followed by our pharmacists with regards to her hypertension.  She was evaluated by Dr. Rockey Situ virtually in 09/2018 and was taking apple cider vinegar daily.  She had stopped Aldactone.  She was taking HCTZ and Bystolic with noted dizziness on 10 mg of Bystolic.  Blood pressures ranged from the Q000111Q to Q000111Q systolic with heart rates in the 60s to 70s bpm.  It was recommended the patient take HCTZ 12.5 mg daily and Bystolic 10 mg daily.  For high blood pressure, she could take amlodipine 2.5 mg as needed or an extra 12.5 mg of HCTZ.  She contacted our office in late 12/2018 noting continued fluctuations in her blood pressure.  It was advised that she eat breakfast prior to taking her morning walk followed by then taking her morning medications.  She was most recently seen in 02/2019 and was doing well from a cardiac  perspective.  Blood pressures overall at home has been well controlled ranging from the Q000111Q to Q000111Q systolic.  Her HCTZ had been refilled with capsules rather than tabs.  With this, she noticed increased urinary output and cramping than she typically noted with the tablets.  Following transition back to tablets her urinary output was back to baseline and her cramping improved.  She was walking on a daily basis without symptoms concerning for angina.  She had not needed any as needed amlodipine.  Her BP was elevated at that visit with a reading of 178/54 though improved upon recheck at 139/81.  She was continued on Bystolic 10 mg daily with as needed HCTZ [id number] mg for systolic blood pressure greater than 150.  Orthostatics were negative in the office that day.  Following her visit, it was advised she could take amlodipine 2.5 mg as needed for elevated BP.  Labs obtained through PCP office showed an LDL of 114 (prior LDL in 04/2018 at 98).  In this setting, she underwent calcium score on 07/03/2019 which was 0.   She has followed up with her PCP noting continued dizzy episodes, including with ambulation as well as fluctuations in her blood pressure.  She comes in doing well from a cardiac perspective.  She notes her BP will range from the 1 teens to 0000000 systolic with most readings in the Q000111Q to Q000111Q systolic.  Heart rates in the 60s bpm.  She has been taking Bystolic 10 mg daily and more recently, amlodipine 3.75 mg daily as well as HCTZ 12.5 mg daily over the past week.  She notes as her  weight trends up so does her BP.  She also notes her chronic dizziness has improved and has attributed this to sinus issues in the fall months.  She notes that she is now able to ambulate outdoors for extended periods of time without any further dizziness.  No palpitations or drops in blood pressure with ambulation.  No chest pain or shortness of breath.  No lower extremity swelling.  She is trying to eat a diet low in sodium as  best she can.   Labs independently reviewed: 06/2019 - Hgb 13.3, PLT 166, BUN 16, creatinine 0.77, potassium 4.2, albumin 4.4, AST normal, ALT 38, TSH normal, TC 184, TG 50, HDL 60, LDL 114, A1c 5.6  Past Medical History:  Diagnosis Date  . Chicken pox   . Chronic Dizziness   . Colon polyps   . Concussion   . Frequent headaches   . GERD (gastroesophageal reflux disease)   . History of stress test    a. 2005 - in setting of ectopy - reportedly nl.  . Hypertension    a. 03/2018 24hr ABM: Mean BP 146/75, mean HR 61; Mean awake BP/HR 150/78, 62; Mean sleep BP/HR 130/65, 58.  . Migraines   . Pre-eclampsia    with grand mal seizures  . Seizure disorder in pregnancy ([place])   . Seizures ([place])    with pregnancy 1981- pre clampsia     Past Surgical History:  Procedure Laterality Date  . COLONOSCOPY    . DILATION AND CURETTAGE OF UTERUS  09/08/2014   with hystereoscopy  . POLYPECTOMY    . TONSILLECTOMY    . TUBAL LIGATION      Current Medications: Current Meds  Medication Sig  . AMBULATORY NON FORMULARY MEDICATION Medication Name: Human Super Beats- 1 teaspoon of Powder prn  . amLODipine (NORVASC) 2.5 MG tablet Take 1 tablet (2.5 mg total) by mouth daily as needed (for high blood pressure). (Patient taking differently: Take 3.75 mg by mouth daily as needed (for high blood pressure). )  . Bioflavonoid Products (ESTER C PO) Take by mouth daily.  Marland Kitchen BYSTOLIC 10 MG tablet TAKE 1 TABLET BY MOUTH EVERY DAY  . Cholecalciferol (VITAMIN D-3) 1000 UNITS CAPS Take 1 capsule by mouth daily.  Marland Kitchen CINNAMON PO Take 1,000 mg by mouth daily.   . fluticasone (FLONASE) 50 MCG/ACT nasal spray Place 1 spray into both nostrils as needed.   . hydrochlorothiazide (HYDRODIURIL) 12.5 MG tablet TAKE 1 TABLET (12.5 MG TOTAL) BY MOUTH AS NEEDED (SBP > 150).  Marland Kitchen MAGNESIUM MALATE PO Take 1 tablet by mouth daily.   . Misc Natural Products (APPLE CIDER VINEGAR DIET PO) Goli Gummies 2 gummies BID.  . Multiple  Vitamins-Minerals (MULTIVITAMIN WITH MINERALS) tablet Take 1 tablet by mouth daily.  . pantoprazole (PROTONIX) 40 MG tablet Take 1 tablet (40 mg total) by mouth 2 (two) times daily before a meal. Pharmacy-please d/c rx for once daily dosing  . Specialty Vitamins Products (VITAMINS FOR THE HAIR PO) Take 1 tablet by mouth daily.  Marland Kitchen zinc gluconate 50 MG tablet Take 50 mg by mouth daily.    Allergies:   Losartan; Anesthetics, amide; Benadryl [diphenhydramine hcl (sleep)]; Benicar [olmesartan]; Cardura [doxazosin mesylate]; Codeine; Fentanyl; Losartan potassium-hctz; Metformin and related; and Prilocaine   Social History   Socioeconomic History  . Marital status: Married    Spouse name: Not on file  . Number of children: Not on file  . Years of education: Not on file  . Highest  education level: Not on file  Occupational History  . Not on file  Tobacco Use  . Smoking status: Never Smoker  . Smokeless tobacco: Never Used  Substance and Sexual Activity  . Alcohol use: Yes    Comment: very rare - glass red wine when that   . Drug use: No  . Sexual activity: Yes    Partners: Male  Other Topics Concern  . Not on file  Social History Narrative  . Not on file   Social Determinants of Health   Financial Resource Strain:   . Difficulty of Paying Living Expenses: Not on file  Food Insecurity:   . Worried About Charity fundraiser in the Last Year: Not on file  . Ran Out of Food in the Last Year: Not on file  Transportation Needs:   . Lack of Transportation (Medical): Not on file  . Lack of Transportation (Non-Medical): Not on file  Physical Activity:   . Days of Exercise per Week: Not on file  . Minutes of Exercise per Session: Not on file  Stress:   . Feeling of Stress : Not on file  Social Connections:   . Frequency of Communication with Friends and Family: Not on file  . Frequency of Social Gatherings with Friends and Family: Not on file  . Attends Religious Services: Not on file   . Active Member of Clubs or Organizations: Not on file  . Attends Archivist Meetings: Not on file  . Marital Status: Not on file     Family History:  The patient's family history includes Cancer in her maternal grandfather; Colon cancer in her maternal grandfather; Diabetes in her father and mother; Heart disease in her father; Hypertension in her father and mother; Kidney disease in her father. There is no history of Colon polyps or Breast cancer.  ROS:   Review of Systems  Constitutional: Negative for chills, diaphoresis, fever, malaise/fatigue and weight loss.  HENT: Negative for congestion.   Eyes: Negative for discharge and redness.  Respiratory: Negative for cough, hemoptysis, sputum production, shortness of breath and wheezing.   Cardiovascular: Negative for chest pain, palpitations, orthopnea, claudication, leg swelling and PND.  Gastrointestinal: Negative for abdominal pain, blood in stool, heartburn, melena, nausea and vomiting.  Genitourinary: Negative for hematuria.  Musculoskeletal: Negative for falls and myalgias.  Skin: Negative for rash.  Neurological: Positive for dizziness. Negative for tingling, tremors, sensory change, speech change, focal weakness, loss of consciousness and weakness.       Dizziness resolved with improvement in pollen count  Endo/Heme/Allergies: Does not bruise/bleed easily.  Psychiatric/Behavioral: Negative for substance abuse. The patient is not nervous/anxious.   All other systems reviewed and are negative.    EKGs/Labs/Other Studies Reviewed:    Studies reviewed were summarized above. The additional studies were reviewed today: As above.  EKG:  EKG is ordered today.  The EKG ordered today demonstrates NSR, 65 bpm, normal axis, no acute ST-T changes  Recent Labs: 06/24/2019: ALT 38; BUN 16; Creatinine, Ser 0.77; Hemoglobin 13.4; Platelets 166; Potassium 4.2; Sodium 141; TSH 1.420  Recent Lipid Panel    Component Value Date/Time    CHOL 184 06/24/2019 0906   TRIG 50 06/24/2019 0906   HDL 60 06/24/2019 0906   CHOLHDL 3.1 06/24/2019 0906   CHOLHDL 3 08/27/2015 0806   VLDL 11.0 08/27/2015 0806   LDLCALC 114 (H) 06/24/2019 0906   LDLDIRECT 113.0 09/16/2014 1208    PHYSICAL EXAM:    VS:  BP (!) 150/90 (BP Location: Left Arm, Patient Position: Sitting, Cuff Size: Normal)   Pulse 65   Ht 5\' 4"  (1.626 m)   Wt 191 lb (86.6 kg)   LMP 12/01/2013 (Approximate)   SpO2 98%   BMI 32.79 kg/m   BMI: Body mass index is 32.79 kg/m.  Physical Exam  Wt Readings from Last 3 Encounters:  07/07/19 191 lb (86.6 kg)  06/24/19 191 lb 12.8 oz (87 kg)  05/06/19 187 lb (84.8 kg)     ASSESSMENT & PLAN:   1. HTN: Blood pressure remains suboptimally controlled at 150/90 at triage with recheck BP 138/84.  Patient feels there is a component of whitecoat hypertension.  Increase amlodipine to 5 mg daily.  Continue Bystolic 10 mg daily, heart rate precludes titration at this time.  Continue HCTZ 12.5 mg as needed systolic blood pressure greater than 150.  Low-sodium diet and weight loss recommended.  2. Chronic dizziness: This has been a longstanding issue with orthostatics previously being negative.  Seems to be attributed to sinus issues related to high pollen counts in the fall.  She denies any further dizziness in the middle of winter with low pollen count.  If symptoms return or become worrisome from a cardiac perspective, could consider GXT to exclude significant arrhythmia or drop in BP.  Recommend she discuss this with PCP.  3. HLD: Most recent LDL obtained by PCP of 114 from 04/2019 with a calcium score, for risk stratification, earlier this month of 0.  Recommend risk factor modification and lifestyle modification.  Given patient's multiple medication intolerances, and calcium score of 0, will defer initiation of statin therapy at this time which can be discussed further with her PCP in follow up.  4. Anxiety: Followed by PCP.   Disposition: F/u with Dr. Rockey Situ or an APP in 1 month.   Medication Adjustments/Labs and Tests Ordered: Current medicines are reviewed at length with the patient today.  Concerns regarding medicines are outlined above. Medication changes, Labs and Tests ordered today are summarized above and listed in the Patient Instructions accessible in Encounters.   Signed, Christell Faith, PA-C 07/07/2019 9:56 AM     [facility] [address] [place] [address] [place], [place] [address] [phone]

## 2019-07-03 ENCOUNTER — Other Ambulatory Visit: Payer: Self-pay

## 2019-07-03 ENCOUNTER — Ambulatory Visit (INDEPENDENT_AMBULATORY_CARE_PROVIDER_SITE_OTHER)
Admission: RE | Admit: 2019-07-03 | Discharge: 2019-07-03 | Disposition: A | Discharge status: Home / Self Care | Payer: Self-pay | Source: Ambulatory Visit | Attending: Cardiovascular Disease | Admitting: Cardiovascular Disease

## 2019-07-03 DIAGNOSIS — Z8249 Family history of ischemic heart disease and other diseases of the circulatory system: Secondary | ICD-10-CM

## 2019-07-07 ENCOUNTER — Other Ambulatory Visit: Payer: Self-pay

## 2019-07-07 ENCOUNTER — Encounter: Payer: Self-pay | Admitting: Physician Assistant

## 2019-07-07 ENCOUNTER — Ambulatory Visit (INDEPENDENT_AMBULATORY_CARE_PROVIDER_SITE_OTHER): Payer: No Typology Code available for payment source | Admitting: Physician Assistant

## 2019-07-07 VITALS — BP 150/90 | HR 65 | Ht 64.0 in | Wt 191.0 lb

## 2019-07-07 DIAGNOSIS — E785 Hyperlipidemia, unspecified: Secondary | ICD-10-CM

## 2019-07-07 DIAGNOSIS — R42 Dizziness and giddiness: Secondary | ICD-10-CM | POA: Diagnosis not present

## 2019-07-07 DIAGNOSIS — I10 Essential (primary) hypertension: Secondary | ICD-10-CM | POA: Diagnosis not present

## 2019-07-07 DIAGNOSIS — F419 Anxiety disorder, unspecified: Secondary | ICD-10-CM | POA: Diagnosis not present

## 2019-07-07 MED ORDER — AMLODIPINE BESYLATE 5 MG PO TABS: 5.0000 mg | ORAL_TABLET | Freq: Every day | ORAL | 3 refills | Status: DC

## 2019-07-07 NOTE — Patient Instructions (Signed)
Medication Instructions:  1- INCREASE Amlodipine Take 1 tablet (5 mg total) by mouth daily. *If you need a refill on your cardiac medications before your next appointment, please call your pharmacy*  Lab Work: None ordered  If you have labs (blood work) drawn today and your tests are completely normal, you will receive your results only by: Marland Kitchen MyChart Message (if you have MyChart) OR . A paper copy in the mail If you have any lab test that is abnormal or we need to change your treatment, we will call you to review the results.  Testing/Procedures: None ordered   Follow-Up: At [facility], you and your health needs are our priority.  As part of our continuing mission to provide you with exceptional heart care, we have created designated Provider Care Teams.  These Care Teams include your primary Cardiologist (physician) and Advanced Practice Providers (APPs -  Physician Assistants and Nurse Practitioners) who all work together to provide you with the care you need, when you need it.  Your next appointment:   1 month(s)  The format for your next appointment:   In Person  Provider:    You may see Ida Rogue, MD or Christell Faith, PA-C.

## 2019-07-08 ENCOUNTER — Ambulatory Visit: Payer: No Typology Code available for payment source

## 2019-07-16 ENCOUNTER — Telehealth: Payer: Self-pay | Admitting: Cardiovascular Disease

## 2019-07-16 NOTE — Telephone Encounter (Signed)
Pt c/o medication issue:  1. Name of Medication: amlopdipine  2. How are you currently taking this medication (dosage and times per day)? 2.5 mg in the am and pm  3. Are you having a reaction (difficulty breathing--STAT)? no  4. What is your medication issue? When taking a 5 mg patient was dizzy, the BP was lower which was the goal. Patient states the 5 mg is too much for her at one time.  Patient would like the pharmacy to be made aware of this so she can either take a 2.5 mg pill or break a 5 mg pill  Please advise

## 2019-07-17 MED ORDER — AMLODIPINE BESYLATE 2.5 MG PO TABS: 2.5000 mg | ORAL_TABLET | Freq: Two times a day (BID) | ORAL | 3 refills | Status: DC

## 2019-07-17 NOTE — Telephone Encounter (Signed)
Amlodipine 2.5 mg bid is ok with me. Please send in new Rx indicating this. Thanks.

## 2019-07-17 NOTE — Telephone Encounter (Signed)
Spoke with patient and she states that she was unable to amlodipine 5 mg in the AM but she has been tolerating 1/2 tablet twice a day. She reports her pressures are running 130's/60's and has been good. She wanted to know if provider would be willing to send in new script for Amlodipine 2.5 mg twice a day so she doesn't have to cut the pill in half. Advised I would check with them and send her mychart if they are agreeable with sending that in. She verbalized understanding with no further questions at this time.

## 2019-08-08 ENCOUNTER — Other Ambulatory Visit: Payer: Self-pay | Admitting: Internal Medicine

## 2019-08-09 NOTE — Progress Notes (Signed)
Date:  08/11/2019   ID:  [name], DOB [birth year], MRN ID:[id number]  Patient Location:  [address] [place] [address]   Provider location:   [facility], [place] office  PCP:  [name], DO  Cardiologist:  Patsy Baltimore  Chief Complaint  Patient presents with  . office visit    1 month F/U-Discuss med concerns; Meds verbally reviewed with patient.    History of Present Illness:    Nicole Gould is a 59 y.o. female past medical history of palpitations, hypertension  Hypoglycemia various medication intolerances calcium score on 07/03/2019 which was 0.  Chronic dizziness unrelated to blood pressure, negative Holter, who presents for follow-up of her Blood pressure  On last televisit, we recommended HCTZ 12.5 mg daily and Bystolic 10 mg daily. Forhigh blood pressure,she could take amlodipine 2.5 mg as needed or an extra 12.5 mg of HCTZ.   seen in 02/2019,  was doing well   Chronic headaches, prior head concussions Seen by neurology Nerve pain causing headaches Neuro started her on gabapentin, might help sleep as well Tried it, made tremors worse  Prior to starting gabapentin felt that she was having some mild head tremor, hand tremor,  Wonders if from amlodipine  Gets dizzy on amlodipine 5 at one time So has been taking amlodipine 2.5 twice daily  Prior intolerances to hypertension  Spironolactone caused breast tenderness eplerenone with similar side effects  leg swelling with higher dose amlodipine Lisinopril caused a cough  Total Chol 184/ LDL 114 CT coronary calcium score 0   Prior CV studies:   The following studies were reviewed today:  Past Medical History:  Diagnosis Date  . Chicken pox   . Chronic Dizziness   . Colon polyps   . Concussion   . Frequent headaches   . GERD (gastroesophageal reflux disease)   . History of stress test    a. 2005 - in setting of ectopy - reportedly nl.  .  Hypertension    a. 03/2018 24hr ABM: Mean BP 146/75, mean HR 61; Mean awake BP/HR 150/78, 62; Mean sleep BP/HR 130/65, 58.  . Migraines   . Pre-eclampsia    with grand mal seizures  . Seizure disorder in pregnancy ([place])   . Seizures ([place])    with pregnancy 1981- pre clampsia    Past Surgical History:  Procedure Laterality Date  . COLONOSCOPY    . DILATION AND CURETTAGE OF UTERUS  09/08/2014   with hystereoscopy  . POLYPECTOMY    . TONSILLECTOMY    . TUBAL LIGATION       Current Meds  Medication Sig  . AMBULATORY NON FORMULARY MEDICATION Medication Name: Human Super Beats- 1 teaspoon of Powder prn  . amLODipine (NORVASC) 2.5 MG tablet Take 1 tablet (2.5 mg total) by mouth 2 (two) times daily.  Marland Kitchen azelastine (ASTELIN) 0.1 % nasal spray Place 1 spray into both nostrils 2 (two) times daily. after sinus rinses  . Bioflavonoid Products (ESTER C PO) Take by mouth daily.  Marland Kitchen BYSTOLIC 10 MG tablet TAKE 1 TABLET BY MOUTH EVERY DAY  . Cholecalciferol (VITAMIN D-3) 1000 UNITS CAPS Take 1 capsule by mouth daily.  Marland Kitchen CINNAMON PO Take 1,000 mg by mouth daily.   . fluticasone (FLONASE) 50 MCG/ACT nasal spray Place 1 spray into both nostrils as needed.   . hydrochlorothiazide (HYDRODIURIL) 12.5 MG tablet TAKE 1 TABLET (12.5 MG TOTAL) BY MOUTH AS NEEDED (SBP > 150).  Marland Kitchen  MAGNESIUM MALATE PO Take 1 tablet by mouth daily.   . Misc Natural Products (APPLE CIDER VINEGAR DIET PO) Goli Gummies 2 gummies BID.  . Multiple Vitamins-Minerals (MULTIVITAMIN WITH MINERALS) tablet Take 1 tablet by mouth daily.  . pantoprazole (PROTONIX) 40 MG tablet TAKE 1 TABLET BY MOUTH 2 TIMES DAILY BEFORE A MEAL.  Marland Kitchen Specialty Vitamins Products (VITAMINS FOR THE HAIR PO) Take 1 tablet by mouth daily.  Marland Kitchen zinc gluconate 50 MG tablet Take 50 mg by mouth daily.     Allergies:   Losartan; Anesthetics, amide; Benadryl [diphenhydramine hcl (sleep)]; Benicar [olmesartan]; Cardura [doxazosin mesylate]; Codeine; Fentanyl; Losartan  potassium-hctz; Metformin and related; and Prilocaine   Social History   Tobacco Use  . Smoking status: Never Smoker  . Smokeless tobacco: Never Used  Substance Use Topics  . Alcohol use: Yes    Comment: very rare - glass red wine when that   . Drug use: No     Family Hx: The patient's family history includes Cancer in her maternal grandfather; Colon cancer in her maternal grandfather; Diabetes in her father and mother; Heart disease in her father; Hypertension in her father and mother; Kidney disease in her father. There is no history of Colon polyps or Breast cancer.  ROS:   Please see the history of present illness.    Review of Systems  Constitutional: Negative.   HENT: Negative.   Respiratory: Negative.   Cardiovascular: Negative.   Gastrointestinal: Negative.   Musculoskeletal: Negative.   Neurological: Negative.   Psychiatric/Behavioral: Negative.   All other systems reviewed and are negative.    Labs/Other Tests and Data Reviewed:    Recent Labs: 06/24/2019: ALT 38; BUN 16; Creatinine, Ser 0.77; Hemoglobin 13.4; Platelets 166; Potassium 4.2; Sodium 141; TSH 1.420   Recent Lipid Panel Lab Results  Component Value Date/Time   CHOL 184 06/24/2019 09:06 AM   TRIG 50 06/24/2019 09:06 AM   HDL 60 06/24/2019 09:06 AM   CHOLHDL 3.1 06/24/2019 09:06 AM   CHOLHDL 3 08/27/2015 08:06 AM   LDLCALC 114 (H) 06/24/2019 09:06 AM   LDLDIRECT 113.0 09/16/2014 12:08 PM    Wt Readings from Last 3 Encounters:  08/11/19 185 lb (83.9 kg)  07/07/19 191 lb (86.6 kg)  06/24/19 191 lb 12.8 oz (87 kg)     Exam:    Vital Signs: Vital signs may also be detailed in the HPI BP (!) 150/80 (BP Location: Left Arm, Patient Position: Sitting, Cuff Size: Normal)   Pulse 62   Ht 5\' 4"  (1.626 m)   Wt 185 lb (83.9 kg)   LMP 12/01/2013 (Approximate)   SpO2 97%   BMI 31.76 kg/m    Constitutional:  oriented to person, place, and time. No distress.  HENT:  Head: Grossly normal Eyes:  no  discharge. No scleral icterus.  Neck: No JVD, no carotid bruits  Cardiovascular: Regular rate and rhythm, no murmurs appreciated Pulmonary/Chest: Clear to auscultation bilaterally, no wheezes or rails Abdominal: Soft.  no distension.  no tenderness.  Musculoskeletal: Normal range of motion Neurological:  normal muscle tone. Coordination normal. No atrophy Skin: Skin warm and dry Psychiatric: normal affect, pleasant   ASSESSMENT & PLAN:    Essential hypertension, benign Recommended she stay on the bystolic, HCTZ [id number] mg daily, Try to take amlodipine 2.5 mg in the morning and amlodipine 2.5 mg in the mid afternoon If continued tremor when going to sleep at night, may need to change amlodipine down to 2.5 mg daily Potentially  could try diltiazem 30 mg as needed for hypertension  anxiety Managed by primary care Retiring end of this year  Dizziness Chronic issue, unrelated to hypertension Etiology unclear   Total encounter time more than 25 minutes  Greater than 50% was spent in counseling and coordination of care with the patient    Signed, Ida Rogue, MD  08/11/2019 3:46 PM    [facility] [address], [place], [place] [address]

## 2019-08-11 ENCOUNTER — Encounter: Payer: Self-pay | Admitting: Cardiovascular Disease

## 2019-08-11 ENCOUNTER — Ambulatory Visit (INDEPENDENT_AMBULATORY_CARE_PROVIDER_SITE_OTHER): Payer: No Typology Code available for payment source | Admitting: Cardiovascular Disease

## 2019-08-11 ENCOUNTER — Other Ambulatory Visit: Payer: Self-pay

## 2019-08-11 VITALS — BP 150/80 | HR 62 | Ht 64.0 in | Wt 185.0 lb

## 2019-08-11 DIAGNOSIS — R42 Dizziness and giddiness: Secondary | ICD-10-CM | POA: Diagnosis not present

## 2019-08-11 DIAGNOSIS — I10 Essential (primary) hypertension: Secondary | ICD-10-CM | POA: Diagnosis not present

## 2019-08-11 DIAGNOSIS — E785 Hyperlipidemia, unspecified: Secondary | ICD-10-CM | POA: Diagnosis not present

## 2019-08-11 DIAGNOSIS — I872 Venous insufficiency (chronic) (peripheral): Secondary | ICD-10-CM

## 2019-08-11 NOTE — Patient Instructions (Addendum)
Medication Instructions:  Try the PM amlodipine earlier in the day, If tremor persists , call the office --we could try diltiazem 30 mg pill in the PM is needed  If you need a refill on your cardiac medications before your next appointment, please call your pharmacy.    Lab work: No new labs needed   If you have labs (blood work) drawn today and your tests are completely normal, you will receive your results only by: Marland Kitchen MyChart Message (if you have MyChart) OR . A paper copy in the mail If you have any lab test that is abnormal or we need to change your treatment, we will call you to review the results.   Testing/Procedures: No new testing needed   Follow-Up: At [facility], you and your health needs are our priority.  As part of our continuing mission to provide you with exceptional heart care, we have created designated Provider Care Teams.  These Care Teams include your primary Cardiologist (physician) and Advanced Practice Providers (APPs -  Physician Assistants and Nurse Practitioners) who all work together to provide you with the care you need, when you need it.  . You will need a follow up appointment in 12 months .  Marland Kitchen Providers on your designated Care Team:   . Murray Hodgkins, NP . Christell Faith, PA-C . Marrianne Mood, PA-C  Any Other Special Instructions Will Be Listed Below (If Applicable).  For educational health videos Log in to : [email or web address] Or : [email or web address], password : [email or web address]

## 2019-08-20 ENCOUNTER — Telehealth: Payer: Self-pay | Admitting: Cardiovascular Disease

## 2019-08-20 NOTE — Telephone Encounter (Signed)
Left voicemail message to call back  

## 2019-08-20 NOTE — Telephone Encounter (Signed)
Patient calling, needs change to prescription States that she needs prescription to state amlodipine 2.5 twice a day Patient states that when she takes 2.5 in the evening she has been having tremors, has been taking just half of 2.5 in evening and that has helped Please call to discuss

## 2019-08-21 ENCOUNTER — Ambulatory Visit: Payer: No Typology Code available for payment source | Attending: Internal Medicine

## 2019-08-21 DIAGNOSIS — Z23 Encounter for immunization: Secondary | ICD-10-CM

## 2019-08-21 NOTE — Progress Notes (Signed)
   Covid-19 Vaccination Clinic  Name:  Analuiza Mcneary    MRN: [id number] DOB: September 27, 1960  08/21/2019  Ms. Bertke was observed post Covid-19 immunization for 15 minutes without incident. She was provided with Vaccine Information Sheet and instruction to access the V-Safe system.   Ms. Quist was instructed to call 911 with any severe reactions post vaccine: Marland Kitchen Difficulty breathing  . Swelling of face and throat  . A fast heartbeat  . A bad rash all over body  . Dizziness and weakness   Immunizations Administered    Name Date Dose VIS Date Route   Pfizer COVID-19 Vaccine 08/21/2019 11:47 AM 0.3 mL 05/30/2019 Intramuscular   Manufacturer: [place]   Lot: [id number]   [place]: [id number]

## 2019-08-22 NOTE — Telephone Encounter (Signed)
Spoke with patient and she is taking amlodipine 2.5 mg in the morning and then 1/2 tablet of the 2.5 mg at night because it caused her tremors. Let her know that I would make provider aware and she also mentioned that she is going to have a sleep study as well. Let her know that I would update provider. She was thankful for the call with no further questions.

## 2019-08-26 ENCOUNTER — Other Ambulatory Visit: Payer: Self-pay

## 2019-08-26 ENCOUNTER — Emergency Department (HOSPITAL_COMMUNITY)
Admission: EM | Admit: 2019-08-26 | Discharge: 2019-08-26 | Disposition: A | Discharge status: Home / Self Care | Payer: No Typology Code available for payment source | Attending: Emergency Medicine | Admitting: Emergency Medicine

## 2019-08-26 ENCOUNTER — Telehealth: Payer: Self-pay | Admitting: Family Medicine

## 2019-08-26 ENCOUNTER — Encounter (HOSPITAL_COMMUNITY): Payer: Self-pay | Admitting: Emergency Medicine

## 2019-08-26 DIAGNOSIS — I10 Essential (primary) hypertension: Secondary | ICD-10-CM | POA: Insufficient documentation

## 2019-08-26 DIAGNOSIS — R531 Weakness: Secondary | ICD-10-CM | POA: Diagnosis present

## 2019-08-26 DIAGNOSIS — Z79899 Other long term (current) drug therapy: Secondary | ICD-10-CM | POA: Insufficient documentation

## 2019-08-26 LAB — BASIC METABOLIC PANEL
Anion gap: 9 (ref 5–15)
BUN: 20 mg/dL (ref 8–23)
CO2: 27 mmol/L (ref 22–32)
Calcium: 9.5 mg/dL (ref 8.9–10.3)
Chloride: 102 mmol/L (ref 98–111)
Creatinine, Ser: 0.78 mg/dL (ref 0.44–1.00)
GFR calc Af Amer: >60 mL/min (ref >60)
GFR calc non Af Amer: >60 mL/min (ref >60)
Glucose, Bld: 96 mg/dL (ref 70–99)
Potassium: 3.5 mmol/L (ref 3.5–5.1)
Sodium: 138 mmol/L (ref 135–145)

## 2019-08-26 LAB — URINALYSIS, ROUTINE W REFLEX MICROSCOPIC
Bilirubin Urine: NEGATIVE
Glucose, UA: NEGATIVE mg/dL
Hgb urine dipstick: NEGATIVE
Ketones, ur: NEGATIVE mg/dL
Leukocytes,Ua: NEGATIVE
Nitrite: NEGATIVE
Protein, ur: NEGATIVE mg/dL
Specific Gravity, Urine: 1.004 — ABNORMAL LOW (ref 1.005–1.030)
pH: 6 (ref 5.0–8.0)

## 2019-08-26 LAB — CBC
HCT: 40.9 % (ref 36.0–46.0)
Hemoglobin: 13.2 g/dL (ref 12.0–15.0)
MCH: 30.3 pg (ref 26.0–34.0)
MCHC: 32.3 g/dL (ref 30.0–36.0)
MCV: 94 fL (ref 80.0–100.0)
Platelets: 162 10*3/uL (ref 150–400)
RBC: 4.35 MIL/uL (ref 3.87–5.11)
RDW: 13.2 % (ref 11.5–15.5)
WBC: 3.5 10*3/uL — ABNORMAL LOW (ref 4.0–10.5)
nRBC: 0 % (ref 0.0–0.2)

## 2019-08-26 NOTE — ED Provider Notes (Signed)
[facility] EMERGENCY DEPARTMENT Provider Note   CSN: [id number] Arrival date & time: 08/26/19  1531     History Chief Complaint  Patient presents with  . Weakness    Nicole Gould is a 59 y.o. female.  The history is provided by the patient and medical records. No language interpreter was used.  Weakness  Nicole Gould is a 59 y.o. female who presents to the Emergency Department complaining of weakness. She received the COVID-19 vaccine on Thursday, March 4. Shortly after receiving the vaccine she developed generalized weakness and jittery sensation.  At times she feels heavy in her legs.  She is experienced intermittent similar symptoms daily since the vaccine. She also has occasions when she feels quite well. For example today when she woke up she felt very well but after she was three quarters of the way through her routine walk she began to feel jittery and generally week again. She denies any fevers, chest pain, shortness of breath, nausea, vomiting. She did have a similar reaction and related to tetanus vaccine several years ago. She has a history of hypertension and is compliant with her home medications. Symptoms are moderate and waxing and waning in nature.    Past Medical History:  Diagnosis Date  . Chicken pox   . Chronic Dizziness   . Colon polyps   . Concussion   . Frequent headaches   . GERD (gastroesophageal reflux disease)   . History of stress test    a. 2005 - in setting of ectopy - reportedly nl.  . Hypertension    a. 03/2018 24hr ABM: Mean BP 146/75, mean HR 61; Mean awake BP/HR 150/78, 62; Mean sleep BP/HR 130/65, 58.  . Migraines   . Pre-eclampsia    with grand mal seizures  . Seizure disorder in pregnancy ([place])   . Seizures ([place])    with pregnancy 1981- pre clampsia     Patient Active Problem List   Diagnosis Date Noted  . Hyperlipidemia 06/26/2019  . Dysesthesia 06/24/2019  . Dry mouth, unspecified 06/24/2019  . Vaccine  counseling 06/24/2019  . Obesity (BMI 30.0-34.9) 05/06/2019  . Allergic rhinitis 05/06/2019  . Insomnia due to stress, anxiety and fear 06/06/2018  . Noncompliance with diet and medication regimen 04/21/2018  . Mood disorder ([place]) 03/07/2018  . Polyp of corpus uteri 02/08/2018  . Hx of seizure disorder 11/10/2017  . Migraine without aura and without status migrainosus, not intractable 11/10/2017  . Prediabetes 11/10/2017  . Spells of trembling 11/10/2017  . Near syncope 10/13/2017  . Sinusitis 07/16/2017  . Edema 04/29/2017  . 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 Dizziness 10/08/2016  . Obesity with body mass index 30 or greater 08/09/2016  . Vaginal discharge 08/09/2016  . History of adenomatous polyp of colon 08/04/2016  . 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 Menopausal symptom 10/25/2014  . Primary insomnia 05/05/2014  . Anxiety 05/05/2014  . Chronic venous insufficiency 04/21/2014  . Adenomatous polyp of colon 05/12/2013  . Visit for preventive health examination 03/14/2013  . Pain 03/11/2013  . Essential hypertension, benign 01/28/2013  . Vulvitis 01/28/2013  . 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 COLONOSCOPY    . DILATION AND CURETTAGE OF UTERUS  09/08/2014   with hystereoscopy  . POLYPECTOMY    . TONSILLECTOMY    . TUBAL LIGATION  OB History   No obstetric history on file.     Family History  Problem Relation Age of Onset  . Hypertension Mother   . Diabetes Mother   . Heart disease Father   . Kidney disease Father   . Hypertension Father   . Diabetes Father   . Cancer Maternal Grandfather        colon cancer  . Colon cancer Maternal Grandfather        passed age 60   . Colon polyps Neg Hx   . Breast cancer Neg Hx     Social History   Tobacco Use  . Smoking status: Never Smoker    . Smokeless tobacco: Never Used  Substance Use Topics  . Alcohol use: Yes    Comment: very rare - glass red wine when that   . Drug use: No    Home Medications Prior to Admission medications   Medication Sig Start Date End Date Taking? Authorizing Provider  AMBULATORY NON FORMULARY MEDICATION Medication Name: Human Super Beats- 1 teaspoon of Powder prn    [provider]  amLODipine (NORVASC) 2.5 MG tablet Take 1 tablet (2.5 mg total) by mouth 2 (two) times daily. 07/17/19 10/15/19  Rise Mu, PA-C  azelastine (ASTELIN) 0.1 % nasal spray Place 1 spray into both nostrils 2 (two) times daily. after sinus rinses 05/06/19   Opalski, Deborah, DO  Bioflavonoid Products (ESTER C PO) Take by mouth daily.    [provider]  BYSTOLIC 10 MG tablet TAKE 1 TABLET BY MOUTH EVERY DAY 06/30/19   Minna Merritts, MD  Cholecalciferol (VITAMIN D-3) 1000 UNITS CAPS Take 1 capsule by mouth daily.    [provider]  CINNAMON PO Take 1,000 mg by mouth daily.     [provider]  fluticasone (FLONASE) 50 MCG/ACT nasal spray Place 1 spray into both nostrils as needed.  07/12/17   [provider]  hydrochlorothiazide (HYDRODIURIL) 12.5 MG tablet TAKE 1 TABLET (12.5 MG TOTAL) BY MOUTH AS NEEDED (SBP > 150). 04/28/19 08/11/19  Rise Mu, PA-C  MAGNESIUM MALATE PO Take 1 tablet by mouth daily.     [provider]  Misc Natural Products (APPLE CIDER VINEGAR DIET PO) [place] Gummies 2 gummies BID.    [provider]  Multiple Vitamins-Minerals (MULTIVITAMIN WITH MINERALS) tablet Take 1 tablet by mouth daily.    [provider]  pantoprazole (PROTONIX) 40 MG tablet TAKE 1 TABLET BY MOUTH 2 TIMES DAILY BEFORE A MEAL. 08/08/19   Pyrtle, Lajuan Lines, MD  Specialty Vitamins Products (VITAMINS FOR THE HAIR PO) Take 1 tablet by mouth daily.    [provider]  zinc gluconate 50 MG tablet Take 50 mg by mouth daily.    [provider]     Allergies    Losartan; Anesthetics, amide; Benadryl [diphenhydramine hcl (sleep)]; Benicar [olmesartan]; Cardura [doxazosin mesylate]; Codeine; Fentanyl; Losartan potassium-hctz; Metformin and related; and Prilocaine  Review of Systems   Review of Systems  Neurological: Positive for weakness.  All other systems reviewed and are negative.   Physical Exam Updated Vital Signs BP 133/71 (BP Location: Left Arm)   Pulse (!) 58   Temp 98.3 F (36.8 C) (Oral)   Resp 18   Ht 5\' 4"  (1.626 m)   Wt 83.9 kg   LMP 12/01/2013 (Approximate)   SpO2 100%   BMI 31.75 kg/m   Physical Exam Vitals and nursing note reviewed.  Constitutional:  Appearance: She is well-developed.  HENT:     Head: Normocephalic and atraumatic.  Cardiovascular:     Rate and Rhythm: Normal rate and regular rhythm.     Heart sounds: No murmur.  Pulmonary:     Effort: Pulmonary effort is normal. No respiratory distress.     Breath sounds: Normal breath sounds.  Abdominal:     Palpations: Abdomen is soft.     Tenderness: There is no abdominal tenderness. There is no guarding or rebound.  Musculoskeletal:        General: No tenderness.     Comments: 2+ radial pulses bilaterally  Skin:    General: Skin is warm and dry.  Neurological:     Mental Status: She is alert and oriented to person, place, and time.     Comments: No asymmetry of facial movements. Visual fields are grossly intact. Five out of five strength in all four extremities with sensation to light touch intact in all four extremities. No pronator drift. 2+ patellar reflexes bilaterally.  Psychiatric:        Behavior: Behavior normal.     ED Results / Procedures / Treatments   Labs (all labs ordered are listed, but only abnormal results are displayed) Labs Reviewed  CBC - Abnormal; Notable for the following components:      Result Value   WBC 3.5 (*)    All other components within normal limits  URINALYSIS, ROUTINE W REFLEX MICROSCOPIC -  Abnormal; Notable for the following components:   Color, Urine STRAW (*)    Specific Gravity, Urine 1.004 (*)    All other components within normal limits  BASIC METABOLIC PANEL    EKG EKG Interpretation  Date/Time:  Tuesday August 26 2019 15:35:20 EST Ventricular Rate:  68 PR Interval:  186 QRS Duration: 74 QT Interval:  386 QTC Calculation: 410 R Axis:   95 Text Interpretation: Normal sinus rhythm Rightward axis Borderline ECG Confirmed by Quintella Reichert [phone]) on 08/26/2019 6:41:02 PM   Radiology No results found.  Procedures Procedures (including critical care time)  Medications Ordered in ED Medications - No data to display  ED Course  I have reviewed the triage vital signs and the nursing notes.  Pertinent labs & imaging results that were available during my care of the patient were reviewed by me and considered in my medical decision making (see chart for details).    MDM Rules/Calculators/A&P                     Pt here for evaluation of generalized weakness after receiving her COVID19 vaccine last week.  She is nontoxic appearing on evaluation with no focal neuro deficits.  Labs without significant anemia or electrolyte abnormality.  Presentation is not c/w CVA, PE, GBS.  D/w pt home care for weakness, PCP follow up.    Final Clinical Impression(s) / ED Diagnoses Final diagnoses:  Weakness    Rx / DC Orders ED Discharge Orders    None       Quintella Reichert, MD 08/26/19 2031

## 2019-08-26 NOTE — ED Notes (Signed)
Pt refusing discharge vitals and esignature, stating she wants to leave due to "being here all day". Discharge paperwork reviewed and directed towards exit.

## 2019-08-26 NOTE — Telephone Encounter (Signed)
Called patient back and she states that she had her COVID vaccine on 08/21/19 and right after administration she felt jerky "like I have not control over my body" she felt like she was going to pass out. She left there and went home and went for a walk and states that she was dizzy, light headed and jerky for the past 3-4 days. Today she feels like her legs are heavy, jerky movements, numbness of the face and foggy headed.   I spoke with Dr. Raliegh Scarlet and she states patient needs to go to ED for evaluation. Patient has been made aware of this and verbalized understanding and states she will have her husband take her to the ED. AS, CMA

## 2019-08-26 NOTE — ED Triage Notes (Signed)
Pt states she had Covid vaccine last Thursday and has felt weird ever since. Intermittent shaking, heavy legs, restless at night, foggy in the head.

## 2019-08-26 NOTE — Telephone Encounter (Signed)
Patient Called states she is having some side effect after rcving 1st Phizer COVID vaccine & ask to be contacted on how longer effects will last.--glh

## 2019-08-26 NOTE — Discharge Instructions (Addendum)
The cause of your symptoms was not found today.  Please follow up with your family doctor for recheck.

## 2019-08-27 ENCOUNTER — Telehealth: Payer: Self-pay | Admitting: Family Medicine

## 2019-08-27 DIAGNOSIS — J3089 Other allergic rhinitis: Secondary | ICD-10-CM

## 2019-08-27 NOTE — Telephone Encounter (Signed)
Okay, thanks for that background info.   If patient prefers to see an allergist, okay to refer her to our  allergy group.  Diagnosis would be seasonal allergies, allergic rhinitis, allergic sinusitis

## 2019-08-27 NOTE — Telephone Encounter (Signed)
Referral has been placed. Left message with this information. AS, CMA

## 2019-08-27 NOTE — Telephone Encounter (Signed)
Please advise if you want me to send referral for allergy or if patient needs to schedule apt.   This is the patient we sent to ED yesterday for "Numbness in face, Foggy headed, unable to control movements 'jerky'" after the COVID vaccine.

## 2019-08-27 NOTE — Telephone Encounter (Signed)
Patient called states went to ED for poss COVID vaccine reaction & was cleared--- Was advised to F/U w/ PCP (pt feels now symptoms maybe related to seasonal Allergies & request to be referred out to an Allergist)   --Forwarding request to med asst & referral cood.--  --glh

## 2019-08-30 ENCOUNTER — Telehealth (INDEPENDENT_AMBULATORY_CARE_PROVIDER_SITE_OTHER): Payer: No Typology Code available for payment source | Admitting: Gastroenterology

## 2019-08-30 ENCOUNTER — Other Ambulatory Visit: Payer: Self-pay | Admitting: Physician Assistant

## 2019-08-30 DIAGNOSIS — K219 Gastro-esophageal reflux disease without esophagitis: Secondary | ICD-10-CM | POA: Diagnosis not present

## 2019-08-30 MED ORDER — SUCRALFATE 1 G PO TABS: 1.0000 g | ORAL_TABLET | Freq: Three times a day (TID) | ORAL | 1 refills | Status: DC

## 2019-08-30 NOTE — Telephone Encounter (Signed)
Spent 15 minutes on the phone discussing patient's acid reflux symptoms and management.  Is on Protonix 40 mg twice daily.  3 times a year or so has flares of severe reflux.  This causes chest pain, mostly when trying to swallow hurts into her back.  She said all symptoms typical of her previous episodes.  Has been taking Tums, Gas-X pills without relief.  Says that she had taken Carafate in the past and recalls that seemed to help previously.  Prescription sent to the pharmacy for tablet and advised to make a slurry as insurance does not cover liquid form.  Patient to call the office next week with update.  Also interested in discussing and scheduling endoscopy.  Discussed antireflux diet.

## 2019-08-30 NOTE — Addendum Note (Signed)
Addended by: Alonza Bogus D on: 08/30/2019 01:11 PM   Modules accepted: Level of Service

## 2019-09-01 NOTE — Telephone Encounter (Signed)
Pt called and spoke to [place] over the weekend. Needs followup with me or APP

## 2019-09-01 NOTE — Telephone Encounter (Signed)
10/08/19 at 850 am  appt with Dr Hilarie Fredrickson.

## 2019-09-01 NOTE — Telephone Encounter (Signed)
Left message on machine to call back  

## 2019-09-02 ENCOUNTER — Telehealth: Payer: Self-pay | Admitting: Internal Medicine

## 2019-09-02 NOTE — Telephone Encounter (Signed)
The patient has been notified of this information and all questions answered.

## 2019-09-02 NOTE — Telephone Encounter (Signed)
The pt has several questions regarding her medications and GERD symptoms; bloating. We discussed that per the 9/20 note Dr Hilarie Fredrickson states the pt can take carafate as needed, she also is taking pantoprazole BID and she states she is taking pepcid at bedtime.  She will call back if her bloating does not resolve and will keep her up coming appt with Dr Hilarie Fredrickson.

## 2019-09-03 ENCOUNTER — Other Ambulatory Visit: Payer: Self-pay

## 2019-09-03 ENCOUNTER — Telehealth: Payer: Self-pay | Admitting: *Deleted

## 2019-09-03 ENCOUNTER — Encounter: Payer: Self-pay | Admitting: Allergy

## 2019-09-03 ENCOUNTER — Ambulatory Visit (INDEPENDENT_AMBULATORY_CARE_PROVIDER_SITE_OTHER): Payer: No Typology Code available for payment source | Admitting: Allergy

## 2019-09-03 VITALS — BP 150/82 | HR 60 | Temp 96.4°F | Resp 16 | Ht 64.75 in | Wt 194.8 lb

## 2019-09-03 DIAGNOSIS — J3089 Other allergic rhinitis: Secondary | ICD-10-CM | POA: Diagnosis not present

## 2019-09-03 DIAGNOSIS — Z7185 Encounter for immunization safety counseling: Secondary | ICD-10-CM

## 2019-09-03 DIAGNOSIS — R42 Dizziness and giddiness: Secondary | ICD-10-CM

## 2019-09-03 DIAGNOSIS — Z7189 Other specified counseling: Secondary | ICD-10-CM

## 2019-09-03 MED ORDER — CARBINOXAMINE MALEATE 4 MG PO TABS: ORAL_TABLET | ORAL | 2 refills | Status: DC

## 2019-09-03 MED ORDER — AZELASTINE-FLUTICASONE 137-50 MCG/ACT NA SUSP: 1.0000 | Freq: Two times a day (BID) | NASAL | 5 refills | Status: DC

## 2019-09-03 MED ORDER — CARBINOXAMINE MALEATE 6 MG PO TABS: 1.0000 | ORAL_TABLET | Freq: Two times a day (BID) | ORAL | 2 refills | Status: DC | PRN

## 2019-09-03 NOTE — Telephone Encounter (Signed)
Please call patient. I sent in carbinoxamine 4mg  tablets. She can take 1.5 tablet to make 6mg  total twice a day as needed for drainage.

## 2019-09-03 NOTE — Telephone Encounter (Signed)
Received a fax from pharmacy stating that Ryvent is not covered by patient's insurance. Pharmacy did state that Carbinoxamine, Levocetirizine, and Dexchlorpheniramine is preferred please advise.

## 2019-09-03 NOTE — Telephone Encounter (Signed)
Carbinoxamine is a generic version of Ryvent.  I'm not sure if we will have that many samples for her to use when she needs it.  I would still recommend that she gets the carbinoxamine to try.

## 2019-09-03 NOTE — Patient Instructions (Addendum)
Today's skin testing showed: Borderline positive to cockroach.  . Start dymista (fluticasone + azelastine nasal spray combination) 1 spray per nostril twice a day. o This replaces Flonase for now.  o If this is not covered by insurance then let us know.  . Start Ryvent 6mg  1 tablet 1-2 times a day as needed for drainage. o Sample given. If it makes you too tired/drowsy then stop and let us know.    See environmental control measures for cockroach as below.  . Get bloodwork:  o We are ordering labs, so please allow 1-2 weeks for the results to come back. o With the newly implemented Cures Act, the labs might be visible to you at the same time that they become visible to me. However, I will not address the results until all of the results are back, so please be patient.   Today's breathing test showed: normal.  Monitor your dizzy spells and check your blood pressure when they occur.   Follow up in 2 months or sooner if needed.   Cockroach Allergen Avoidance Cockroaches are often found in the homes of densely populated urban areas, schools or commercial buildings, but these creatures can lurk almost anywhere. This does not mean that you have a dirty house or living area. . Block all areas where roaches can enter the home. This includes crevices, wall cracks and windows.  . Cockroaches need water to survive, so fix and seal all leaky faucets and pipes. Have an exterminator go through the house when your family and pets are gone to eliminate any remaining roaches. Marland Kitchen Keep food in lidded containers and put pet food dishes away after your pets are done eating. Vacuum and sweep the floor after meals, and take out garbage and recyclables. Use lidded garbage containers in the kitchen. Wash dishes immediately after use and clean under stoves, refrigerators or toasters where crumbs can accumulate. Wipe off the stove and other kitchen surfaces and cupboards regularly.

## 2019-09-03 NOTE — Telephone Encounter (Signed)
Called and spoke with patient and she verbalized understanding but stated that when she starts on a medication she is comfortable with she like to stay on that medication. Advised to patient understanding but that Ryvent is not preferred by her insurance and that she could check with the pharmacy to see how much the Carbubixamine is with her insurance and try 1 month's supply to see how she likes it. She states that with COVID has caused her to not be able to work and she is wonderfully if she can just pick up samples of Ryvent whenever she needs them since she does not have congestion that often since the medication is as needed. Please advise.

## 2019-09-03 NOTE — Telephone Encounter (Signed)
Called and informed that patient. Patient verbalized understanding and will pick up the medication once she runs out of Ryvent samples.

## 2019-09-03 NOTE — Progress Notes (Signed)
New Patient Note  RE: Nicole Gould MRN: [id number] DOB: [birth year] Date of Office Visit: 09/03/2019  Referring provider: [name], DO Primary care provider: [name], DO  Chief Complaint: Allergic Rhinitis  (runny nose, clear mucous down throat, nausea, runny eyes, ), Other (since getting the covid vaccine, b/p has been low), and Other (when walking, feels dizzy, having some pain in her face and cheeks)  History of Present Illness: I had the pleasure of seeing Nicole Gould for initial evaluation at the [facility] on 09/04/2019. She is a 59 y.o. female, who is referred here by [name], DO for the evaluation of allergic rhinitis.  Rhinitis: She reports symptoms of rhinorrhea, slight nasal congestion, PND, watery eyes, dizziness. Symptoms have been going on for 2+ years. The symptoms are present all year around with worsening in the cooler months. Other triggers include exposure to none. Anosmia: no. Headache: yes. She has used Flonase with minimal improvement in symptoms. Sinus infections: no. Previous work up includes: skin testing was done over 20 years ago. Patient was on allergy injections as a teenager. Previous ENT evaluation: for dizziness.  Previous sinus imaging: no. History of nasal polyps: no. Last eye exam: within the past year. History of reflux: yes and currently on PPI.  Dizziness: Patient has been dealing with feeling dizzy for quite some time. Patient has history of having hypoglycemia and was seen by endocrinology for this in the past as well.  Patient also has hypertension and has been having issues with hypotension in the past. She is being followed by cardiology and her medications have been adjusted. She states that she gets white coat hypertension. She is also being followed by neurology.   She also had her first covid-19 vaccine on 08/21/2019 and since then has been feeling not her normal self. Denies any immediate  reactions after the vaccine but she did go to the ER on 08/26/19 due to not feeling well (weakness) and work up was unremarkable.  Assessment and Plan: Nicole Gould is a 59 y.o. female with: Other allergic rhinitis Perennial rhinitis symptoms for 2+ years with worsening the cooler months. The PND is leading to nausea and having dizziness as well. Used Flonase with minimal benefit. Patient used to be on allergy injections as a teenager. She has been seen by ENT as well. On PPI for GERD.  Today's skin prick testing showed: Borderline positive to cockroach.  Patient declined intradermal testing and will get bloodwork instead.   Discussed with patient that unlikely that the above allergen is causing all her symptoms.  . Start dymista (fluticasone + azelastine nasal spray combination) 1 spray per nostril twice a day. o This replaces Flonase for now.  o If this is not covered by insurance then let us know.  . Start Ryvent 6mg  1 tablet 1-2 times a day as needed for drainage. o Sample given. If it makes you too tired/drowsy then stop and let us know.    See environmental control measures for cockroach as below.   Dizziness Patient has ongoing complaints of dizziness for many years. She was evaluated by endocrinology as she was having issues with her blood glucose levels, follows with cardiology for hypertension and neurology for headaches/migraines. She also saw ENT in the past. To date no clear etiology found for her dizziness.  Checked spirometry today which was normal.  Concern if there's a component of eustachian tube dysfunction contributing to her dizziness due to her rhinitis symptoms. The fluticasone/azelastine  nasal spray should help with this.   Advised patient to monitor dizzy spells and check blood pressure when they occur. Her blood pressure was elevated at today's office visit but states she has white coat hypertension.   Vaccine counseling Patient had first pfizer Covid-19 vaccine on  08/21/2019 and has been feeling weak on and off. She went to ER on 08/26/19 and had normal work up. Denies any IgE mediated symptoms.  Discussed with patient that these seem to be non-IgE mediated symptoms.  She has to weigh the risks and benefits of getting her second vaccine versus not getting it. From allergy standpoint, she has no contraindications.   Advised her to wait 30 minutes after the vaccine to let the staff know what happened to her the last time.   Return in about 2 months (around 11/03/2019).  Meds ordered this encounter  Medications  . Azelastine-Fluticasone 137-50 MCG/ACT SUSP    Sig: Place 1 spray into the nose in the morning and at bedtime.    Dispense:  23 g    Refill:  5  . DISCONTD: Carbinoxamine Maleate (RYVENT) 6 MG TABS    Sig: Take 1 tablet by mouth 2 (two) times daily as needed (drainage).    Dispense:  60 tablet    Refill:  2    Lab Orders     Allergens w/Total IgE Area 2  Other allergy screening: Asthma: no Food allergy: no Medication allergy: yes  Patient tolerates oral benadryl but had issues with IV Benadryl cocktail in the form of felt "paralyzed" Hymenoptera allergy: no Urticaria: no Eczema:no History of recurrent infections suggestive of immunodeficency: no  Diagnostics: Spirometry:  Tracings reviewed. Her effort: Good reproducible efforts. FVC: 2.28L FEV1: 1.90L, 91% predicted FEV1/FVC ratio: 83% Interpretation: Spirometry consistent with normal pattern.  Please see scanned spirometry results for details.  Skin Testing: Environmental allergy panel and select foods. Positive test to: cockroach.  Results discussed with patient/family. Airborne Adult Perc - 09/03/19 1004    Time Antigen Placed  1005    Allergen Manufacturer  Greer    Location  Back    Number of Test  59    Panel 1  Select    1. Control-Buffer 50% Glycerol  Negative    2. Control-Histamine 1 mg/ml  2+    3. Albumin saline  Negative    4. [place]  Negative    5. [place]   Negative    6. Johnson  Negative    7. North [place] Blue  Negative    8. Meadow Fescue  Negative    9. Perennial Rye  Negative    10. Sweet Vernal  Negative    11. Timothy  Negative    12. Cocklebur  Negative    13. Burweed Marshelder  Negative    14. Ragweed, short  Negative    15. Ragweed, Giant  Negative    16. Plantain,  English  Negative    17. Lamb's Quarters  Negative    18. Sheep Sorrell  Negative    19. Rough Pigweed  Negative    20. Marsh Elder, Rough  Negative    21. Mugwort, Common  Negative    22. Ash mix  Negative    23. Birch mix  Negative    24. Beech American  Negative    25. Box, Elder  Negative    26. Cedar, red  Negative    27. Cottonwood, [place]  Negative    28. Elm mix  Negative  29. Hickory mix  Negative    30. Maple mix  Negative    31. Oak, [place] mix  Negative    32. Pecan Pollen  Negative    33. Pine mix  Negative    34. Sycamore Eastern  Negative    35. Norton, Black Pollen  Negative    36. Alternaria alternata  Negative    37. Cladosporium Herbarum  Negative    38. Aspergillus mix  Negative    39. Penicillium mix  Negative    40. Bipolaris sorokiniana (Helminthosporium)  Negative    41. Drechslera spicifera (Curvularia)  Negative    42. Mucor plumbeus  Negative    43. Fusarium moniliforme  Negative    44. Aureobasidium pullulans (pullulara)  Negative    45. Rhizopus oryzae  Negative    46. Botrytis cinera  Negative    47. Epicoccum nigrum  Negative    48. Phoma betae  Negative    49. Candida Albicans  Negative    50. Trichophyton mentagrophytes  Negative    51. Mite, D Farinae  5,000 AU/ml  Negative    52. Mite, D Pteronyssinus  5,000 AU/ml  Negative    53. Cat Hair 10,000 BAU/ml  Negative    54.  Dog Epithelia  Negative    55. Mixed Feathers  Negative    56. Horse Epithelia  Negative    57. Cockroach, German  2+    58. Mouse  Negative    59. Tobacco Leaf  Negative     Food Perc - 09/03/19 1005    Time Antigen Placed  1005     Allergen Manufacturer  Lavella Hammock    Location  Back    Number of allergen test  10    Food  Select    1. Peanut  Negative    2. Soybean food  Negative    3. Wheat, whole  Negative    4. Sesame  Negative    5. Milk, cow  Negative    6. Egg White, chicken  Negative    7. Casein  Negative    8. Shellfish mix  Negative    9. Fish mix  Negative    10. Cashew  Negative       Past Medical History: Patient Active Problem List   Diagnosis Date Noted  . Gastroesophageal reflux disease 08/30/2019  . Hyperlipidemia 06/26/2019  . Dysesthesia 06/24/2019  . Dry mouth, unspecified 06/24/2019  . Vaccine counseling 06/24/2019  . Obesity (BMI 30.0-34.9) 05/06/2019  . Other allergic rhinitis 05/06/2019  . Insomnia due to stress, anxiety and fear 06/06/2018  . Noncompliance with diet and medication regimen 04/21/2018  . Mood disorder ([place]) 03/07/2018  . Polyp of corpus uteri 02/08/2018  . Hx of seizure disorder 11/10/2017  . Migraine without aura and without status migrainosus, not intractable 11/10/2017  . Prediabetes 11/10/2017  . Spells of trembling 11/10/2017  . Near syncope 10/13/2017  . Sinusitis 07/16/2017  . Edema 04/29/2017  . 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 Dizziness 10/08/2016  . Obesity with body mass index 30 or greater 08/09/2016  . Vaginal discharge 08/09/2016  . History of adenomatous polyp of colon 08/04/2016  . 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 Menopausal symptom 10/25/2014  . Primary insomnia 05/05/2014  . Anxiety 05/05/2014  . Chronic venous insufficiency 04/21/2014  . Adenomatous polyp of colon 05/12/2013  . Visit for preventive health examination  03/14/2013  . Pain 03/11/2013  . Essential hypertension, benign 01/28/2013  . Vulvitis 01/28/2013  . 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 Chicken pox   . Chronic  Dizziness   . Colon polyps   . Concussion   . Frequent headaches   . GERD (gastroesophageal reflux disease)   . History of stress test    a. 2005 - in setting of ectopy - reportedly nl.  . Hypertension    a. 03/2018 24hr ABM: Mean BP 146/75, mean HR 61; Mean awake BP/HR 150/78, 62; Mean sleep BP/HR 130/65, 58.  . Migraines   . Pre-eclampsia    with grand mal seizures  . Seizure disorder in pregnancy ([place])   . Seizures ([place])    with pregnancy 1981- pre clampsia    Past Surgical History: Past Surgical History:  Procedure Laterality Date  . COLONOSCOPY    . DILATION AND CURETTAGE OF UTERUS  09/08/2014   with hystereoscopy  . POLYPECTOMY    . TONSILLECTOMY    . TONSILLECTOMY    . TUBAL LIGATION     Medication List:  Current Outpatient Medications  Medication Sig Dispense Refill  . AMBULATORY NON FORMULARY MEDICATION Medication Name: Human Super Beats- 1 teaspoon of Powder prn    . amLODipine (NORVASC) 2.5 MG tablet Take 1 tablet (2.5 mg total) by mouth 2 (two) times daily. 180 tablet 3  . Bioflavonoid Products (ESTER C PO) Take by mouth daily.    Marland Kitchen BYSTOLIC 10 MG tablet TAKE 1 TABLET BY MOUTH EVERY DAY 90 tablet 0  . Cholecalciferol (VITAMIN D-3) 1000 UNITS CAPS Take 1 capsule by mouth daily.    Marland Kitchen CINNAMON PO Take 1,000 mg by mouth daily.     . hydrochlorothiazide (HYDRODIURIL) 12.5 MG tablet TAKE 1 TABLET (12.5 MG TOTAL) BY MOUTH AS NEEDED (SBP > 150). 90 tablet 1  . indomethacin (INDOCIN) 50 MG capsule     . MAGNESIUM MALATE PO Take 1 tablet by mouth daily.     . Misc Natural Products (APPLE CIDER VINEGAR DIET PO) [place]oli Gummies 2 gummies BID.    . Multiple Vitamins-Minerals (MULTIVITAMIN WITH MINERALS) tablet Take 1 tablet by mouth daily.    . pantoprazole (PROTONIX) 40 MG tablet TAKE 1 TABLET BY MOUTH 2 TIMES DAILY BEFORE A MEAL. 60 tablet 2  . Specialty Vitamins Products (VITAMINS FOR THE HAIR PO) Take 1 tablet by mouth daily.    . sucralfate (CARAFATE) 1 g tablet Take 1  tablet (1 g total) by mouth 4 (four) times daily -  with meals and at bedtime. Slowly dissolve 1 tablet in 1 tablespoon of distilled water before taking. 120 tablet 1  . sucralfate (CARAFATE) 1 g tablet Take 1 tablet (1 g total) by mouth 4 (four) times daily -  before meals and at bedtime. 56 tablet 1  . zinc gluconate 50 MG tablet Take 50 mg by mouth daily.    . Azelastine-Fluticasone 137-50 MCG/ACT SUSP Place 1 spray into the nose in the morning and at bedtime. 23 g 5  . Carbinoxamine Maleate 4 MG TABS Take 1 and 1/2 tablet (total 6mg ) twice a day as needed for drainage. 45 tablet 2   No current facility-administered medications for this visit.   Allergies: Allergies  Allergen Reactions  . Losartan Shortness Of Breath    Dizziness,  Shortness of breath   . Anesthetics, Amide Nausea And Vomiting  . Benadryl [Diphenhydramine Hcl (Sleep)]  Made feel paralyzed when had benadryl with a cocktail for procedure  Decadron, bendayl, regaln combo at [facility] 2014- see care every where   . Benicar [Olmesartan]     Presyncopal , light headed   . Cardura [Doxazosin Mesylate] Other (See Comments)    "makes me feel terrible"  . Codeine     Other reaction(s): Unknown  . Fentanyl Hypertension    Severe htn   . Losartan Potassium-Hctz     Dizziness,  Shortness of breath   . Metformin And Related     "out of body experience"    . Prilocaine Nausea And Vomiting   Social History: Social History   Socioeconomic History  . Marital status: Married    Spouse name: Not on file  . Number of children: Not on file  . Years of education: Not on file  . Highest education level: Not on file  Occupational History  . Not on file  Tobacco Use  . Smoking status: Never Smoker  . Smokeless tobacco: Never Used  Substance and Sexual Activity  . Alcohol use: Yes    Comment: very rare - glass red wine when that   . Drug use: No  . Sexual activity: Yes    Partners: Male  Other Topics Concern  . Not on file   Social History Narrative  . Not on file   Social Determinants of Health   Financial Resource Strain:   . Difficulty of Paying Living Expenses:   Food Insecurity:   . Worried About Charity fundraiser in the Last Year:   . Arboriculturist in the Last Year:   Transportation Needs:   . Film/video editor (Medical):   Marland Kitchen Lack of Transportation (Non-Medical):   Physical Activity:   . Days of Exercise per Week:   . Minutes of Exercise per Session:   Stress:   . Feeling of Stress :   Social Connections:   . Frequency of Communication with Friends and Family:   . Frequency of Social Gatherings with Friends and Family:   . Attends Religious Services:   . Active Member of Clubs or Organizations:   . Attends Archivist Meetings:   Marland Kitchen Marital Status:    Lives in a house which is 61 year old. Smoking: denies Occupation: Conservation officer, historic buildings HistoryFreight forwarder in the house: no Charity fundraiser in the family room: no Carpet in the bedroom: no Heating: gas Cooling: central Pet: no  Family History: Family History  Problem Relation Age of Onset  . Hypertension Mother   . Diabetes Mother   . Heart disease Father   . Kidney disease Father   . Hypertension Father   . Diabetes Father   . Cancer Maternal Grandfather        colon cancer  . Colon cancer Maternal Grandfather        passed age 18   . Colon polyps Neg Hx   . Breast cancer Neg Hx    Problem                               Relation Asthma                                   No  Eczema  No  Food allergy                          No  Allergic rhino conjunctivitis     No   Review of Systems  Constitutional: Negative for appetite change, chills, fever and unexpected weight change.  HENT: Positive for postnasal drip and rhinorrhea. Negative for congestion.   Eyes: Negative for itching.  Respiratory: Negative for cough, chest tightness, shortness of breath and wheezing.     Cardiovascular: Negative for chest pain.  Gastrointestinal: Positive for abdominal pain.  Genitourinary: Negative for difficulty urinating.  Skin: Negative for rash.  Allergic/Immunologic: Positive for environmental allergies. Negative for food allergies.  Neurological: Positive for dizziness, weakness and headaches.   Objective: BP (!) 150/82 (BP Location: Left Arm, Patient Position: Sitting, Cuff Size: Large)   Pulse 60   Temp (!) 96.4 F (35.8 C) (Temporal)   Resp 16   Ht 5' 4.75" (1.645 m)   Wt 194 lb 12.8 oz (88.4 kg)   LMP 12/01/2013 (Approximate)   SpO2 99%   BMI 32.67 kg/m  Body mass index is 32.67 kg/m. Physical Exam  Constitutional: She is oriented to person, place, and time. She appears well-developed and well-nourished.  HENT:  Head: Normocephalic and atraumatic.  Right Ear: External ear normal.  Left Ear: External ear normal.  Nose: Nose normal.  Mouth/Throat: Oropharynx is clear and moist.  Eyes: Conjunctivae and EOM are normal.  Cardiovascular: Normal rate, regular rhythm and normal heart sounds. Exam reveals no gallop and no friction rub.  No murmur heard. Pulmonary/Chest: Effort normal and breath sounds normal. She has no wheezes. She has no rales.  Abdominal: Soft.  Musculoskeletal:     Cervical back: Neck supple.  Neurological: She is alert and oriented to person, place, and time.  Skin: Skin is warm. No rash noted.  Psychiatric: She has a normal mood and affect. Her behavior is normal.  Nursing note and vitals reviewed.  The plan was reviewed with the patient/family, and all questions/concerned were addressed.  It was my pleasure to see Nicole Gould today and participate in her care. Please feel free to contact me with any questions or concerns.  Sincerely,  Rexene Alberts, DO Allergy & Immunology  [facility] office: [phone] [facility] office: [place] office: [phone]

## 2019-09-03 NOTE — Addendum Note (Signed)
Addended by: Garnet Sierras on: 09/03/2019 03:00 PM   Modules accepted: Orders

## 2019-09-04 ENCOUNTER — Telehealth: Payer: Self-pay | Admitting: Family Medicine

## 2019-09-04 ENCOUNTER — Encounter: Payer: Self-pay | Admitting: Allergy

## 2019-09-04 ENCOUNTER — Ambulatory Visit: Payer: Self-pay | Admitting: *Deleted

## 2019-09-04 NOTE — Telephone Encounter (Signed)
Pt called due to symptoms after she had 1st COVID vaccine on 08/21/19; she states the had to wait 30 minutes, but she felt "weird, disconnect from herself, involuntary movements, and lightheadedness" when she was in waiting period; she subsequently went to the ED on 08/26/19 with these symptoms and leg heaviness per the instruction of her PCP; the pt says that she is currently not having symptoms; explained to pt that people typically have flu-like symptoms after the [place]; reviewed recommendations per [email or web address]; pt adviced to review the information on the site related to adverse reactions and the reporting system; she states that she saw an allergist on yesterday; pt also is to speak with her PCP on 09/05/19; informed pt that after review of the above and discussion with her PCP; she has to evaluate the risk and benefits associated with receiving 2nd dose of vaccine; she verbalized understanding.

## 2019-09-04 NOTE — Assessment & Plan Note (Signed)
Patient had first pfizer Covid-19 vaccine on 08/21/2019 and has been feeling weak on and off. She went to ER on 08/26/19 and had normal work up. Denies any IgE mediated symptoms.  Discussed with patient that these seem to be non-IgE mediated symptoms.  She has to weigh the risks and benefits of getting her second vaccine versus not getting it. From allergy standpoint, she has no contraindications.   Advised her to wait 30 minutes after the vaccine to let the staff know what happened to her the last time.

## 2019-09-04 NOTE — Telephone Encounter (Signed)
Patient calling today asking if she can still receive her 2nd COVID vaccine. Please advise. AS, CMA      08/26/19 3:16 PM Note   Called patient back and she states that she had her COVID vaccine on 08/21/19 and right after administration she felt jerky "like I have not control over my body" she felt like she was going to pass out. She left there and went home and went for a walk and states that she was dizzy, light headed and jerky for the past 3-4 days. Today she feels like her legs are heavy, jerky movements, numbness of the face and foggy headed.   I spoke with Dr. Raliegh Scarlet and she states patient needs to go to ED for evaluation. Patient has been made aware of this and verbalized understanding and states she will have her husband take her to the ED. AS, CMA

## 2019-09-04 NOTE — Telephone Encounter (Signed)
  Reason for Disposition . General information question, no triage required and triager able to answer question  Answer Assessment - Initial Assessment Questions 1. REASON FOR CALL or QUESTION: "What is your reason for calling today?" or "How can I best help you?" or "What question do you have that I can help answer?"     Reaction to COVID vaccine  Protocols used: [place]

## 2019-09-04 NOTE — Assessment & Plan Note (Signed)
Perennial rhinitis symptoms for 2+ years with worsening the cooler months. The PND is leading to nausea and having dizziness as well. Used Flonase with minimal benefit. Patient used to be on allergy injections as a teenager. She has been seen by ENT as well. On PPI for GERD.  Today's skin prick testing showed: Borderline positive to cockroach.  Patient declined intradermal testing and will get bloodwork instead.   Discussed with patient that unlikely that the above allergen is causing all her symptoms.  . Start dymista (fluticasone + azelastine nasal spray combination) 1 spray per nostril twice a day. o This replaces Flonase for now.  o If this is not covered by insurance then let us know.  . Start Ryvent 6mg  1 tablet 1-2 times a day as needed for drainage. o Sample given. If it makes you too tired/drowsy then stop and let us know.    See environmental control measures for cockroach as below.

## 2019-09-04 NOTE — Telephone Encounter (Signed)
Patient called states she wishes to be advised if she "Should get 2nd COVID shot " since she had a possible reaction to the 1st one.  --Forwarding message to med asst to contact pt w/ advice @ [phone]  --glh

## 2019-09-04 NOTE — Assessment & Plan Note (Signed)
Patient has ongoing complaints of dizziness for many years. She was evaluated by endocrinology as she was having issues with her blood glucose levels, follows with cardiology for hypertension and neurology for headaches/migraines. She also saw ENT in the past. To date no clear etiology found for her dizziness.  Checked spirometry today which was normal.  Concern if there's a component of eustachian tube dysfunction contributing to her dizziness due to her rhinitis symptoms. The fluticasone/azelastine nasal spray should help with this.   Advised patient to monitor dizzy spells and check blood pressure when they occur. Her blood pressure was elevated at today's office visit but states she has white coat hypertension.

## 2019-09-05 NOTE — Telephone Encounter (Signed)
After speaking with CMA, she did give patient advice yesterday to call the number on the sheets of paperwork she was given during the vaccine to let them know of any potential side effects.  I recommend she do that as recommended yesterday to the patient by the Tea.    Additionally, let the patient know that this is not a typical side effect of the vaccine however so little is known about it that it might possibly have been a reaction from it.    If she feels convinced the symptoms were from the vaccine, it would be up to her whether or not she should get the second shot, as a contraindication per CDC guidelines would be any type of anaphylactic reaction to the vaccine which she did not have

## 2019-09-05 NOTE — Telephone Encounter (Signed)
Patient is aware of the below and verbalized understanding. AS, CMA 

## 2019-09-06 LAB — ALLERGENS W/TOTAL IGE AREA 2
Alternaria Alternata IgE: <0.1 kU/L
Aspergillus Fumigatus IgE: <0.1 kU/L
Bermuda Grass IgE: 0.6 kU/L — AB
Cat Dander IgE: <0.1 kU/L
Cedar, Mountain IgE: 0.39 kU/L — AB
Cladosporium Herbarum IgE: <0.1 kU/L
Cockroach, German IgE: 0.34 kU/L — AB
Common Silver Birch IgE: 0.21 kU/L — AB
Cottonwood IgE: 0.34 kU/L — AB
D Farinae IgE: <0.1 kU/L
D Pteronyssinus IgE: <0.1 kU/L
Dog Dander IgE: <0.1 kU/L
Elm, American IgE: 0.63 kU/L — AB
IgE (Immunoglobulin E), Serum: 26 IU/mL (ref 6–495)
Johnson Grass IgE: 0.66 kU/L — AB
Maple/Box Elder IgE: 0.63 kU/L — AB
Mouse Urine IgE: <0.1 kU/L
Oak, White IgE: 0.65 kU/L — AB
Pecan, Hickory IgE: 0.41 kU/L — AB
Penicillium Chrysogen IgE: <0.1 kU/L
Pigweed, Rough IgE: 0.48 kU/L — AB
Ragweed, Short IgE: 0.64 kU/L — AB
Sheep Sorrel IgE Qn: 0.66 kU/L — AB
Timothy Grass IgE: 0.7 kU/L — AB
White Mulberry IgE: 0.29 kU/L — AB

## 2019-09-11 ENCOUNTER — Ambulatory Visit (INDEPENDENT_AMBULATORY_CARE_PROVIDER_SITE_OTHER): Payer: No Typology Code available for payment source | Admitting: Internal Medicine

## 2019-09-11 ENCOUNTER — Encounter: Payer: Self-pay | Admitting: Internal Medicine

## 2019-09-11 VITALS — BP 132/74 | HR 84 | Temp 97.8°F | Ht 64.0 in | Wt 193.0 lb

## 2019-09-11 DIAGNOSIS — R140 Abdominal distension (gaseous): Secondary | ICD-10-CM

## 2019-09-11 DIAGNOSIS — K219 Gastro-esophageal reflux disease without esophagitis: Secondary | ICD-10-CM | POA: Diagnosis not present

## 2019-09-11 MED ORDER — PANTOPRAZOLE SODIUM 40 MG PO TBEC: DELAYED_RELEASE_TABLET | ORAL | 2 refills | Status: DC

## 2019-09-11 NOTE — Progress Notes (Signed)
   Subjective:    Patient ID: Nicole Gould, female    DOB: [birth year], 59 y.o.   MRN: [id number]  HPI Nicole Gould is a 59 year old female with a history of GERD, adenomatous and sessile serrated colon polyp who is here for follow-up.  She was last seen on 12/19/2018 by virtual visit.  She is here alone today.  She reports that over the last year she has had issues with heartburn flaring.  At other times she does very well and has great periods where she feels like she can "conquer the world".  She has been taking pantoprazole 40 mg twice daily AC which started shortly after her last visit with me.  We tried Dexilant but this was cost prohibitive.  When her symptoms flare she will have traditional burning heartburn but also burning pain in her back.  Belching.  Nausea.  Gas bloating symptom.  She has been avoiding foods known to trigger her heartburn such as tea, avoiding coffee, occasionally red wine.  No vomiting.  No dysphagia or odynophagia.  She is using sucralfate slurry mostly at bedtime which she thinks helps.  Recently she has been having abdominal bloating and discomfort nonspecific.  Bowel habits have not changed.  No blood in stool or melena.  Carafate which she takes at bedtime really helps her symptoms and she reports this is "life-saving".  About 3 weeks ago she had her first dose of the [place] COVID-19 vaccination.  She felt slightly off and some muscle twitching which she reported to [employer].  She plans to go for her second and final dose on 09/16/2019  Review of Systems As per HPI, otherwise negative  Current Medications, Allergies, Past Medical History, Past Surgical History, Family History and Social History were reviewed in [employer] record.     Objective:   Physical Exam Temp 97.8 F (36.6 C)   Ht 5\' 4"  (1.626 m)   Wt 193 lb (87.5 kg)   LMP 12/01/2013 (Approximate)   BMI 33.13 kg/m  Gen: awake, alert, NAD HEENT: anicteric CV: RRR, no mrg  Pulm: CTA b/l Abd: soft, NT/ND, +BS throughout Ext: no c/c/e Neuro: nonfocal     Assessment & Plan:  59 year old female with a history of GERD, adenomatous and sessile serrated colon polyp who is here for follow-up  1.  GERD --periodic bouts of significant heartburn symptom with belching despite twice daily PPI.  In 2014 I was suspicious for eosinophilic esophagitis based on subtle rings in the esophagus but biopsies did not show eosinophilia but rather just reflux related inflammation.  We discussed this today.  I recommend that we repeat her upper endoscopy given ongoing symptoms despite twice daily PPI.  She is using apple cider vinegar chews which I recommended she avoid doing right before lying down.  We also discussed the importance of not eating or drinking for 1 hour at least before lying down , ideally more than 2 hours for anything solid. --Continue pantoprazole 40 mg twice daily AC --Continue sucralfate slurry 1 g nightly as needed  2.  Abdominal gas and bloating --trial of align 1 capsule daily  3.  History of colon polyps --surveillance colonoscopy due in 2023  30 minutes total spent today including patient facing time, coordination of care, reviewing medical history/procedures/pertinent radiology studies, and documentation of the encounter.

## 2019-09-11 NOTE — Patient Instructions (Addendum)
You have been scheduled for an endoscopy. Please follow written instructions given to you at your visit today. If you use inhalers (even only as needed), please bring them with you on the day of your procedure. Your physician has requested that you go to www.startemmi.com and enter the access code given to you at your visit today. This web site gives a general overview about your procedure. However, you should still follow specific instructions given to you by our office regarding your preparation for the procedure.  We have sent the following medications to your pharmacy for you to pick up at your convenience: Pantoprazole 40 mg twice daily    Continue carafate slurry at bedtime only.  Please purchase the following medications over the counter and take as directed: Align 1 capsule daily x 1-2 months.  If you are age 61 or older, your body mass index should be between 23-30. Your Body mass index is 33.13 kg/m. If this is out of the aforementioned range listed, please consider follow up with your Primary Care Provider.  If you are age 83 or younger, your body mass index should be between 19-25. Your Body mass index is 33.13 kg/m. If this is out of the aformentioned range listed, please consider follow up with your Primary Care Provider.   Due to recent changes in healthcare laws, you may see the results of your imaging and laboratory studies on MyChart before your provider has had a chance to review them.  We understand that in some cases there may be results that are confusing or concerning to you. Not all laboratory results come back in the same time frame and the provider may be waiting for multiple results in order to interpret others.  Please give [place] 48 hours in order for your provider to thoroughly review all the results before contacting the office for clarification of your results.

## 2019-09-15 ENCOUNTER — Telehealth: Payer: Self-pay | Admitting: Internal Medicine

## 2019-09-15 NOTE — Telephone Encounter (Signed)
Patient is calling- she states that she has completely quit taking the Carafate. She states that she was in pain when she took it. She states that now since she has started taking the Protonix she has diarrhea, beching, and has gas and feels sick to her stomach after eating. She wanted to make Dr. Hilarie Fredrickson aware of these symptoms and asking what she should do. States she just does not feel well at all.

## 2019-09-15 NOTE — Telephone Encounter (Signed)
Dr Hilarie Fredrickson please see the note from the pt.  She states she had to stop carafate due to it causing pain.  She states that protonix is making her sick and causes diarrhea.  She wants to know what else she can do.  Please advise?

## 2019-09-16 ENCOUNTER — Ambulatory Visit: Payer: No Typology Code available for payment source | Attending: Internal Medicine

## 2019-09-16 DIAGNOSIS — Z23 Encounter for immunization: Secondary | ICD-10-CM

## 2019-09-16 NOTE — Progress Notes (Signed)
   Covid-19 Vaccination Clinic  Name:  Nicole Gould    MRN: ID:[id number] DOB: August 08, 1960  09/16/2019  Nicole Gould was observed post Covid-19 immunization for 15 minutes without incident. She was provided with Vaccine Information Sheet and instruction to access the V-Safe system.   Nicole Gould was instructed to call 911 with any severe reactions post vaccine: Marland Kitchen Difficulty breathing  . Swelling of face and throat  . A fast heartbeat  . A bad rash all over body  . Dizziness and weakness   Immunizations Administered    Name Date Dose VIS Date Route   Pfizer COVID-19 Vaccine 09/16/2019  3:23 PM 0.3 mL 05/30/2019 Intramuscular   Manufacturer: [employer]   Lot: [id number]   [place]: [id number]

## 2019-09-18 MED ORDER — HYOSCYAMINE SULFATE 0.125 MG SL SUBL: SUBLINGUAL_TABLET | SUBLINGUAL | 0 refills | Status: DC

## 2019-09-18 NOTE — Telephone Encounter (Signed)
Left message for pt that the meds that were sent to the pharmacy should help with cramping and that she could take Imodium for diarrhea.

## 2019-09-18 NOTE — Telephone Encounter (Signed)
Levsin 0.125 mg 1-2 po q4h prn abdominal pain/cramping, #30, no refills

## 2019-09-18 NOTE — Telephone Encounter (Signed)
Nicole Gould pt that was seen by Dr. Norman Herrlich recently and was prescribed carafate. She told Dr. Hilarie Fredrickson that when she took carafate it upset her stomach all day. He instructed her to only take it at night. That night she took it and woke up at 3am with horrible stomach cramps. She has stopped the carafate. She was instructed to try align for diarrhea, she states it did not do anything for her and she stopped it. She reports she did have a formed stool this am. Pts main complaint at this time is abdominal cramping. She is scheduled for an EGD in April. Would bentyl or levsin be an option for this pt? As DOD please advise.

## 2019-09-18 NOTE — Telephone Encounter (Signed)
Patient is calling to follow up on previous message. She is upset said no one has called her. I told her Dr. Hilarie Fredrickson has been out of the office and she said someone should have called her to advise her of him being out. She is requesting for someone to call in the meantime to advise for she is in pain.

## 2019-09-18 NOTE — Telephone Encounter (Signed)
Spoke with pt and she is aware, script sent to pharmacy. 

## 2019-09-18 NOTE — Telephone Encounter (Signed)
Patient is calling said she had lunch and it went right through her will the medication you sent help her with that

## 2019-09-24 ENCOUNTER — Telehealth: Payer: Self-pay

## 2019-09-24 ENCOUNTER — Other Ambulatory Visit: Payer: Self-pay | Admitting: Cardiovascular Disease

## 2019-09-24 NOTE — Telephone Encounter (Signed)
Pt calling about her test results, results given.  Verbalized understanding.  Call ended.  Pt states that she has been unable to get on my chart, so she called for the results.

## 2019-10-02 ENCOUNTER — Telehealth: Payer: Self-pay | Admitting: Family Medicine

## 2019-10-02 NOTE — Telephone Encounter (Signed)
Patient called states she has a scheduled procedure @ [facility] w/ Dr. Chalmers Cater who needs her lastest lab results frm Jan 2021 CPE--Pls fax to [phone].  --FYI to med asst this has been done.  -glh

## 2019-10-03 NOTE — Telephone Encounter (Signed)
10/03/2019  Lab results forwarded to Dr. Chalmers Cater.  Charyl Bigger, CMA

## 2019-10-08 ENCOUNTER — Ambulatory Visit: Payer: No Typology Code available for payment source | Admitting: Internal Medicine

## 2019-10-15 ENCOUNTER — Other Ambulatory Visit: Payer: Self-pay

## 2019-10-15 ENCOUNTER — Ambulatory Visit (AMBULATORY_SURGERY_CENTER): Payer: No Typology Code available for payment source | Admitting: Internal Medicine

## 2019-10-15 ENCOUNTER — Encounter: Payer: Self-pay | Admitting: Internal Medicine

## 2019-10-15 VITALS — BP 127/65 | HR 56 | Temp 97.5°F | Resp 14 | Ht 64.0 in | Wt 193.0 lb

## 2019-10-15 DIAGNOSIS — K219 Gastro-esophageal reflux disease without esophagitis: Secondary | ICD-10-CM

## 2019-10-15 DIAGNOSIS — K317 Polyp of stomach and duodenum: Secondary | ICD-10-CM | POA: Diagnosis not present

## 2019-10-15 DIAGNOSIS — K2950 Unspecified chronic gastritis without bleeding: Secondary | ICD-10-CM | POA: Diagnosis not present

## 2019-10-15 DIAGNOSIS — K2980 Duodenitis without bleeding: Secondary | ICD-10-CM

## 2019-10-15 MED ORDER — SODIUM CHLORIDE 0.9 % IV SOLN: 500.0000 mL | Freq: Once | INTRAVENOUS | Status: DC

## 2019-10-15 NOTE — Progress Notes (Signed)
Temp check by:LC Vital check by:DT  The medical and surgical history was reviewed and verified with the patient. 

## 2019-10-15 NOTE — Op Note (Signed)
[place] Patient Name: Nicole Gould Procedure Date: 10/15/2019 9:36 AM MRN: [id number] Endoscopist: Jerene Bears , MD Age: 59 Referring MD:  Date of Birth: 1960-10-25 Gender: Female Account #: [id number] Procedure:                Upper GI endoscopy Indications:              Gastro-esophageal reflux disease with BID PPI,                            abdominal bloating now improving on probiotic Medicines:                Monitored Anesthesia Care Procedure:                Pre-Anesthesia Assessment:                           - Prior to the procedure, a History and Physical                            was performed, and patient medications and                            allergies were reviewed. The patient's tolerance of                            previous anesthesia was also reviewed. The risks                            and benefits of the procedure and the sedation                            options and risks were discussed with the patient.                            All questions were answered, and informed consent                            was obtained. Prior Anticoagulants: The patient has                            taken no previous anticoagulant or antiplatelet                            agents. ASA Grade Assessment: II - A patient with                            mild systemic disease. After reviewing the risks                            and benefits, the patient was deemed in                            satisfactory condition to undergo the procedure.  After obtaining informed consent, the endoscope was                            passed under direct vision. Throughout the                            procedure, the patient's blood pressure, pulse, and                            oxygen saturations were monitored continuously. The                            Endoscope was introduced through the mouth, and                            advanced to  the second part of duodenum. The upper                            GI endoscopy was accomplished without difficulty.                            The patient tolerated the procedure well. Scope In: Scope Out: Findings:                 The examined esophagus was normal. Biopsies were                            taken with a cold forceps for histology.                           A few small sessile polyps were found in the                            gastric fundus and in the gastric body. These are                            benign and have the typical appearance of fundic                            gland polyps.                           The exam of the stomach was otherwise normal.                           Biopsies were taken with a cold forceps in the                            gastric body, at the incisura and in the gastric                            antrum for histology and Helicobacter pylori  testing.                           Mild inflammation characterized by erythema was                            found in the duodenal bulb.                           The second portion of the duodenum was normal. Complications:            No immediate complications. Estimated Blood Loss:     Estimated blood loss was minimal. Impression:               - Normal esophagus. Biopsied.                           - A few benign gastric polyps.                           - Stomach otherwise normal. Biopsies performed for                            H. Pylori testing.                           - Mild duodenitis.                           - Normal second portion of the duodenum. Recommendation:           - Patient has a contact number available for                            emergencies. The signs and symptoms of potential                            delayed complications were discussed with the                            patient. Return to normal activities tomorrow.                             Written discharge instructions were provided to the                            patient.                           - Resume previous diet.                           - Continue present medications including probiotic.                           - Await pathology results. Jerene Bears, MD 10/15/2019 9:58:17 AM This report has been signed electronically.

## 2019-10-15 NOTE — Patient Instructions (Signed)
Please read handouts provided. Continue present medications. Await pathology results.    YOU HAD AN ENDOSCOPIC PROCEDURE TODAY AT THE [facility]:   Refer to the procedure report that was given to you for any specific questions about what was found during the examination.  If the procedure report does not answer your questions, please call your gastroenterologist to clarify.  If you requested that your care partner not be given the details of your procedure findings, then the procedure report has been included in a sealed envelope for you to review at your convenience later.  YOU SHOULD EXPECT: Some feelings of bloating in the abdomen. Passage of more gas than usual.  Walking can help get rid of the air that was put into your GI tract during the procedure and reduce the bloating. If you had a lower endoscopy (such as a colonoscopy or flexible sigmoidoscopy) you may notice spotting of blood in your stool or on the toilet paper. If you underwent a bowel prep for your procedure, you may not have a normal bowel movement for a few days.  Please Note:  You might notice some irritation and congestion in your nose or some drainage.  This is from the oxygen used during your procedure.  There is no need for concern and it should clear up in a day or so.  SYMPTOMS TO REPORT IMMEDIATELY:    Following upper endoscopy (EGD)  Vomiting of blood or coffee ground material  New chest pain or pain under the shoulder blades  Painful or persistently difficult swallowing  New shortness of breath  Fever of 100F or higher  Black, tarry-looking stools  For urgent or emergent issues, a gastroenterologist can be reached at any hour by calling [phone]. Do not use MyChart messaging for urgent concerns.    DIET:  We do recommend a small meal at first, but then you may proceed to your regular diet.  Drink plenty of fluids but you should avoid alcoholic beverages for 24 hours.  ACTIVITY:  You  should plan to take it easy for the rest of today and you should NOT DRIVE or use heavy machinery until tomorrow (because of the sedation medicines used during the test).    FOLLOW UP: Our staff will call the number listed on your records 48-72 hours following your procedure to check on you and address any questions or concerns that you may have regarding the information given to you following your procedure. If we do not reach you, we will leave a message.  We will attempt to reach you two times.  During this call, we will ask if you have developed any symptoms of COVID 19. If you develop any symptoms (ie: fever, flu-like symptoms, shortness of breath, cough etc.) before then, please call [phone].  If you test positive for Covid 19 in the 2 weeks post procedure, please call and report this information to us.    If any biopsies were taken you will be contacted by phone or by letter within the next 1-3 weeks.  Please call us at [phone] if you have not heard about the biopsies in 3 weeks.    SIGNATURES/CONFIDENTIALITY: You and/or your care partner have signed paperwork which will be entered into your electronic medical record.  These signatures attest to the fact that that the information above on your After Visit Summary has been reviewed and is understood.  Full responsibility of the confidentiality of this discharge information lies with you and/or your care-partner. 

## 2019-10-15 NOTE — Progress Notes (Signed)
Report to PACU, RN, vss, BBS= Clear.  

## 2019-10-15 NOTE — Progress Notes (Signed)
Called to room to assist during endoscopic procedure.  Patient ID and intended procedure confirmed with present staff. Received instructions for my participation in the procedure from the performing physician.  

## 2019-10-17 ENCOUNTER — Telehealth: Payer: Self-pay

## 2019-10-17 NOTE — Telephone Encounter (Signed)
  Follow up Call-  Call back number 10/15/2019  Post procedure Call Back phone  # [phone]  Permission to leave phone message Yes  Some recent data might be hidden     Patient questions:  Do you have a fever, pain , or abdominal swelling? No. Pain Score  0 *  Have you tolerated food without any problems? Yes.    Have you been able to return to your normal activities? Yes.    Do you have any questions about your discharge instructions: Diet   No. Medications  No. Follow up visit  No.  Do you have questions or concerns about your Care? No.  Actions: * If pain score is 4 or above: No action needed, pain <4. 1. Have you developed a fever since your procedure? no  2.   Have you had an respiratory symptoms (SOB or cough) since your procedure? no  3.   Have you tested positive for COVID 19 since your procedure no  4.   Have you had any family members/close contacts diagnosed with the COVID 19 since your procedure?  no   If yes to any of these questions please route to Joylene John, RN and Erenest Rasher, RN

## 2019-10-17 NOTE — Telephone Encounter (Signed)
Attempted to reach patient for post-procedure f/u call. No answer. Left message that we will make another attempt to reach her later today and for her to please not hesitate to call us if she has any questions/concerns regarding her care. 

## 2019-10-20 ENCOUNTER — Encounter: Payer: Self-pay | Admitting: Internal Medicine

## 2019-11-02 NOTE — Progress Notes (Deleted)
Follow Up Note  RE: Nicole Gould MRN: [id number] DOB: 1960/09/12 Date of Office Visit: 11/03/2019  Referring provider: [name], DO Primary care provider: Lorrene Reid, PA-C  Chief Complaint: No chief complaint on file.  History of Present Illness: I had the pleasure of seeing Nicole Gould for a follow up visit at the [facility]  on 11/02/2019. She is a 59 y.o. female, who is being followed for allergic rhinitis, dizziness. Her previous allergy office visit was on 09/03/2019 with Dr. Maudie Mercury. Today is a regular follow up visit.  Other allergic rhinitis Your bloodwork showed additional positives to grass pollen, tree pollen, ragweed and weed pollen.  See below for environmental control measures for pollens.  Perennial rhinitis symptoms for 2+ years with worsening the cooler months. The PND is leading to nausea and having dizziness as well. Used Flonase with minimal benefit. Patient used to be on allergy injections as a teenager. She has been seen by ENT as well. On PPI for GERD.  Today's skin prick testing showed: Borderline positive to cockroach.  Patient declined intradermal testing and will get bloodwork instead.   Discussed with patient that unlikely that the above allergen is causing all her symptoms.   Start dymista (fluticasone + azelastine nasal spray combination) 1 spray per nostril twice a day. ? This replaces Flonase for now.  ? If this is not covered by insurance then let us know.   Start Ryvent 6mg  1 tablet 1-2 times a day as needed for drainage. ? Sample given. If it makes you too tired/drowsy then stop and let us know.    See environmental control measures for cockroach as below.   Dizziness Patient has ongoing complaints of dizziness for many years. She was evaluated by endocrinology as she was having issues with her blood glucose levels, follows with cardiology for hypertension and neurology for headaches/migraines. She also  saw ENT in the past. To date no clear etiology found for her dizziness.  Checked spirometry today which was normal.  Concern if there's a component of eustachian tube dysfunction contributing to her dizziness due to her rhinitis symptoms. The fluticasone/azelastine nasal spray should help with this.   Advised patient to monitor dizzy spells and check blood pressure when they occur. Her blood pressure was elevated at today's office visit but states she has white coat hypertension.   Vaccine counseling Patient had first pfizer Covid-19 vaccine on 08/21/2019 and has been feeling weak on and off. She went to ER on 08/26/19 and had normal work up. Denies any IgE mediated symptoms.  Discussed with patient that these seem to be non-IgE mediated symptoms.  She has to weigh the risks and benefits of getting her second vaccine versus not getting it. From allergy standpoint, she has no contraindications.   Advised her to wait 30 minutes after the vaccine to let the staff know what happened to her the last time.   Return in about 2 months (around 11/03/2019).  Assessment and Plan: Nicole Gould is a 59 y.o. female with: No problem-specific Assessment & Plan notes found for this encounter.  No follow-ups on file.  No orders of the defined types were placed in this encounter.  Lab Orders  No laboratory test(s) ordered today    Diagnostics: Spirometry:  Tracings reviewed. Her effort: {Blank single:19197::"Good reproducible efforts.","It was hard to get consistent efforts and there is a question as to whether this reflects a maximal maneuver.","Poor effort, data can not be interpreted."} FVC: ***L FEV1: ***L, ***%  predicted FEV1/FVC ratio: ***% Interpretation: {Blank single:19197::"Spirometry consistent with mild obstructive disease","Spirometry consistent with moderate obstructive disease","Spirometry consistent with severe obstructive disease","Spirometry consistent with possible restrictive  disease","Spirometry consistent with mixed obstructive and restrictive disease","Spirometry uninterpretable due to technique","Spirometry consistent with normal pattern","No overt abnormalities noted given today's efforts"}.  Please see scanned spirometry results for details.  Skin Testing: {Blank single:19197::"Select foods","Environmental allergy panel","Environmental allergy panel and select foods","Food allergy panel","None","Deferred due to recent antihistamines use"}. Positive test to: ***. Negative test to: ***.  Results discussed with patient/family.   Medication List:  Current Outpatient Medications  Medication Sig Dispense Refill  . amLODipine (NORVASC) 2.5 MG tablet Take 1 tablet (2.5 mg total) by mouth 2 (two) times daily. 180 tablet 3  . Azelastine-Fluticasone 137-50 MCG/ACT SUSP Place 1 spray into the nose in the morning and at bedtime. 23 g 5  . Bioflavonoid Products (ESTER C PO) Take by mouth daily.    Marland Kitchen BYSTOLIC 10 MG tablet TAKE 1 TABLET BY MOUTH EVERY DAY 90 tablet 2  . Carbinoxamine Maleate 4 MG TABS Take 1 and 1/2 tablet (total 6mg ) twice a day as needed for drainage. 45 tablet 2  . Cholecalciferol (VITAMIN D-3) 1000 UNITS CAPS Take 1 capsule by mouth daily.    Marland Kitchen CINNAMON PO Take 1,000 mg by mouth daily.     . hydrochlorothiazide (HYDRODIURIL) 12.5 MG tablet TAKE 1 TABLET (12.5 MG TOTAL) BY MOUTH AS NEEDED (SBP > 150). 90 tablet 1  . MAGNESIUM MALATE PO Take 1 tablet by mouth daily.     . Misc Natural Products (APPLE CIDER VINEGAR DIET PO) Goli Gummies 2 gummies BID.    . Multiple Vitamins-Minerals (MULTIVITAMIN WITH MINERALS) tablet Take 1 tablet by mouth daily.    . pantoprazole (PROTONIX) 40 MG tablet TAKE 1 TABLET BY MOUTH 2 TIMES DAILY BEFORE A MEAL. 60 tablet 2  . Specialty Vitamins Products (VITAMINS FOR THE HAIR PO) Take 1 tablet by mouth daily.    . sucralfate (CARAFATE) 1 g tablet Take 1 tablet (1 g total) by mouth 4 (four) times daily -  with meals and at  bedtime. Slowly dissolve 1 tablet in 1 tablespoon of distilled water before taking. 120 tablet 1  . zinc gluconate 50 MG tablet Take 50 mg by mouth daily.     No current facility-administered medications for this visit.   Allergies: Allergies  Allergen Reactions  . Losartan Shortness Of Breath    Dizziness,  Shortness of breath   . Anesthetics, Amide Nausea And Vomiting  . Benadryl [Diphenhydramine Hcl (Sleep)]     Made feel paralyzed when had benadryl with a cocktail for procedure  Decadron, bendayl, regaln combo at [facility] 2014- see care every where   . Benicar [Olmesartan]     Presyncopal , light headed   . Cardura [Doxazosin Mesylate] Other (See Comments)    "makes me feel terrible"  . Codeine     Other reaction(s): Unknown  . Fentanyl Hypertension    Severe htn   . Losartan Potassium-Hctz     Dizziness,  Shortness of breath   . Metformin And Related     "out of body experience"    . Prilocaine Nausea And Vomiting   I reviewed her past medical history, social history, family history, and environmental history and no significant changes have been reported from her previous visit.  Review of Systems  Constitutional: Negative for appetite change, chills, fever and unexpected weight change.  HENT: Positive for postnasal drip and rhinorrhea. Negative  for congestion.   Eyes: Negative for itching.  Respiratory: Negative for cough, chest tightness, shortness of breath and wheezing.   Cardiovascular: Negative for chest pain.  Gastrointestinal: Positive for abdominal pain.  Genitourinary: Negative for difficulty urinating.  Skin: Negative for rash.  Allergic/Immunologic: Positive for environmental allergies. Negative for food allergies.  Neurological: Positive for dizziness, weakness and headaches.   Objective: LMP 12/01/2013 (Approximate)  There is no height or weight on file to calculate BMI. Physical Exam  Constitutional: She is oriented to person, place, and time. She  appears well-developed and well-nourished.  HENT:  Head: Normocephalic and atraumatic.  Right Ear: External ear normal.  Left Ear: External ear normal.  Nose: Nose normal.  Mouth/Throat: Oropharynx is clear and moist.  Eyes: Conjunctivae and EOM are normal.  Cardiovascular: Normal rate, regular rhythm and normal heart sounds. Exam reveals no gallop and no friction rub.  No murmur heard. Pulmonary/Chest: Effort normal and breath sounds normal. She has no wheezes. She has no rales.  Abdominal: Soft.  Musculoskeletal:     Cervical back: Neck supple.  Neurological: She is alert and oriented to person, place, and time.  Skin: Skin is warm. No rash noted.  Psychiatric: She has a normal mood and affect. Her behavior is normal.  Nursing note and vitals reviewed.  Previous notes and tests were reviewed. The plan was reviewed with the patient/family, and all questions/concerned were addressed.  It was my pleasure to see Nicole Gould today and participate in her care. Please feel free to contact me with any questions or concerns.  Sincerely,  Rexene Alberts, DO Allergy & Immunology  [facility] office: [phone] [facility] office: [place] office: [phone]

## 2019-11-03 ENCOUNTER — Ambulatory Visit: Payer: No Typology Code available for payment source | Admitting: Allergy

## 2019-11-10 ENCOUNTER — Other Ambulatory Visit: Payer: Self-pay

## 2019-11-10 ENCOUNTER — Ambulatory Visit (INDEPENDENT_AMBULATORY_CARE_PROVIDER_SITE_OTHER): Payer: No Typology Code available for payment source | Admitting: Allergy

## 2019-11-10 ENCOUNTER — Encounter: Payer: Self-pay | Admitting: Allergy

## 2019-11-10 VITALS — BP 124/86 | HR 58 | Resp 18 | Ht 64.0 in

## 2019-11-10 DIAGNOSIS — J3089 Other allergic rhinitis: Secondary | ICD-10-CM

## 2019-11-10 DIAGNOSIS — J302 Other seasonal allergic rhinitis: Secondary | ICD-10-CM | POA: Diagnosis not present

## 2019-11-10 DIAGNOSIS — H1010 Acute atopic conjunctivitis, unspecified eye: Secondary | ICD-10-CM | POA: Diagnosis not present

## 2019-11-10 DIAGNOSIS — R42 Dizziness and giddiness: Secondary | ICD-10-CM | POA: Diagnosis not present

## 2019-11-10 MED ORDER — AZELASTINE HCL 0.05 % OP SOLN: 1.0000 [drp] | Freq: Two times a day (BID) | OPHTHALMIC | 5 refills | Status: AC | PRN

## 2019-11-10 NOTE — Assessment & Plan Note (Signed)
Past history - Patient has ongoing complaints of dizziness for many years. She was evaluated by endocrinology as she was having issues with her blood glucose levels, follows with cardiology for hypertension and neurology for headaches/migraines. She also saw ENT in the past. To date no clear etiology found for her dizziness. 2021 spirometry today was normal. Interim history - unchanged.   Monitor symptoms.

## 2019-11-10 NOTE — Patient Instructions (Addendum)
Past skin testing showed:  positives to grass pollen, tree pollen, ragweed, weed pollen, cockroaches.   . Continue dymista (fluticasone + azelastine nasal spray combination) 1 spray per nostril twice a day. . Try Xyzal 5mg  at night and see if it helps with the post nasal drainage. Samples given. . If it's not as helpful as Ryvent then I will try to get Ryvent approved. o We don't have samples in the office today.  May use azelastine eye drops twice a day as needed for itchy/watery eyes.   See environmental control measures as below.  Read about allergy injections. Let us know when ready to start. Had a detailed discussion with patient/family that clinical history is suggestive of allergic rhinitis, and may benefit from allergy immunotherapy (AIT). Discussed in detail regarding the dosing, schedule, side effects (mild to moderate local allergic reaction and rarely systemic allergic reactions including anaphylaxis), and benefits (significant improvement in nasal symptoms, seasonal flares of asthma) of immunotherapy with the patient. There is significant time commitment involved with allergy shots, which includes weekly immunotherapy injections for first 9-12 months and then biweekly to monthly injections for 3-5 years.   Follow up in 3 months or sooner if needed.   Cockroach Allergen Avoidance Cockroaches are often found in the homes of densely populated urban areas, schools or commercial buildings, but these creatures can lurk almost anywhere. This does not mean that you have a dirty house or living area. . Block all areas where roaches can enter the home. This includes crevices, wall cracks and windows.  . Cockroaches need water to survive, so fix and seal all leaky faucets and pipes. Have an exterminator go through the house when your family and pets are gone to eliminate any remaining roaches. Marland Kitchen Keep food in lidded containers and put pet food dishes away after your pets are done eating. Vacuum  and sweep the floor after meals, and take out garbage and recyclables. Use lidded garbage containers in the kitchen. Wash dishes immediately after use and clean under stoves, refrigerators or toasters where crumbs can accumulate. Wipe off the stove and other kitchen surfaces and cupboards regularly.  Reducing Pollen Exposure . Pollen seasons: trees (spring), grass (summer) and ragweed/weeds (fall). Marland Kitchen Keep windows closed in your home and car to lower pollen exposure.  Susa Simmonds air conditioning in the bedroom and throughout the house if possible.  . Avoid going out in dry windy days - especially early morning. . Pollen counts are highest between 5 - 10 AM and on dry, hot and windy days.  . Save outside activities for late afternoon or after a heavy rain, when pollen levels are lower.  . Avoid mowing of grass if you have grass pollen allergy. Marland Kitchen Be aware that pollen can also be transported indoors on people and pets.  . Dry your clothes in an automatic dryer rather than hanging them outside where they might collect pollen.  . Rinse hair and eyes before bedtime.

## 2019-11-10 NOTE — Assessment & Plan Note (Addendum)
Past history - 2021 allergy testing showed: positives to grass pollen, tree pollen, ragweed, weed pollen, cockroaches.  Interim history - questionable reaction to generic carbinoxamine in the form of facial rash, Ryvent worked well but Teacher, adult education.  Still having PND and itchy eyes. . Continue dymista (fluticasone + azelastine nasal spray combination) 1 spray per nostril twice a day. o Demonstrated proper use.  . Try Xyzal 5mg  or allegra 180mg  at night and see if it helps with the post nasal drainage. Samples given. . If it's not as helpful as Ryvent then I will try to get Ryvent approved. o No samples in the office today.  May use azelastine eye drops twice a day as needed for itchy/watery eyes.  Continue environmental control measures as below.  Read about allergy injections. Let us know when ready to start. Had a detailed discussion with patient/family that clinical history is suggestive of allergic rhinitis, and may benefit from allergy immunotherapy (AIT). Discussed in detail regarding the dosing, schedule, side effects (mild to moderate local allergic reaction and rarely systemic allergic reactions including anaphylaxis), and benefits (significant improvement in nasal symptoms, seasonal flares of asthma) of immunotherapy with the patient. There is significant time commitment involved with allergy shots, which includes weekly immunotherapy injections for first 9-12 months and then biweekly to monthly injections for 3-5 years.

## 2019-11-10 NOTE — Progress Notes (Signed)
Follow Up Note  RE: Nicole Gould MRN: [id number] DOB: 12/03/1960 Date of Office Visit: 11/10/2019  Referring provider: [name], DO Primary care provider: Lorrene Reid, PA-C  Chief Complaint: Allergic Rhinitis  and Allergic Reaction (Carbonoxamine)  History of Present Illness: I had the pleasure of seeing Nicole Gould for a follow up visit at the [facility] [place] on 11/10/2019. She is a 59 y.o. female, who is being followed for allergic rhinitis, dizziness. Her previous allergy office visit was on 09/03/2019 with Dr. Maudie Mercury. Today is a regular follow up visit. Up to date with COVID-19 vaccine: yes [place].  Patient took Ryvent with no issues and it helped. However, her insurance wouldn't cover it and so she took generic carbinoxamine and broke out in a facial rash on her right side.  Denies any changes in personal care products.   The rash resolved within 1-2 days and she stopped taking generic carbinoxamine.   Currently on dymista 1 spray twice a day but it was going back down her throat with minimal benefit.  Still having PND and itchy eyes.   Tried allegra and Claritin in the past with marginal benefit. Interested in starting allergy injections.  Dizziness Still has some dizziness and usually happens when her blood sugar is low or if her blood pressure is off.   Assessment and Plan: Nicole Gould is a 59 y.o. female with: Seasonal and perennial allergic rhinoconjunctivitis Past history - 2021 allergy testing showed: positives to grass pollen, tree pollen, ragweed, weed pollen, cockroaches.  Interim history - questionable reaction to generic carbinoxamine in the form of facial rash, Ryvent worked well but Teacher, adult education.  Still having PND and itchy eyes. . Continue dymista (fluticasone + azelastine nasal spray combination) 1 spray per nostril twice a day. o Demonstrated proper use.  . Try Xyzal 5mg  or allegra 180mg  at night and see if it helps  with the post nasal drainage. Samples given. . If it's not as helpful as Ryvent then I will try to get Ryvent approved. o No samples in the office today.  May use azelastine eye drops twice a day as needed for itchy/watery eyes.  Continue environmental control measures as below.  Read about allergy injections. Let us know when ready to start. Had a detailed discussion with patient/family that clinical history is suggestive of allergic rhinitis, and may benefit from allergy immunotherapy (AIT). Discussed in detail regarding the dosing, schedule, side effects (mild to moderate local allergic reaction and rarely systemic allergic reactions including anaphylaxis), and benefits (significant improvement in nasal symptoms, seasonal flares of asthma) of immunotherapy with the patient. There is significant time commitment involved with allergy shots, which includes weekly immunotherapy injections for first 9-12 months and then biweekly to monthly injections for 3-5 years.   Dizziness Past history - Patient has ongoing complaints of dizziness for many years. She was evaluated by endocrinology as she was having issues with her blood glucose levels, follows with cardiology for hypertension and neurology for headaches/migraines. She also saw ENT in the past. To date no clear etiology found for her dizziness. 2021 spirometry today was normal. Interim history - unchanged.   Monitor symptoms.   Return in about 3 months (around 02/10/2020).  Meds ordered this encounter  Medications  . azelastine (OPTIVAR) 0.05 % ophthalmic solution    Sig: Place 1 drop into both eyes 2 (two) times daily as needed (itchy/watery eyes).    Dispense:  6 mL    Refill:  5  Diagnostics: None.  Medication List:  Current Outpatient Medications  Medication Sig Dispense Refill  . Azelastine-Fluticasone 137-50 MCG/ACT SUSP Place 1 spray into the nose in the morning and at bedtime. 23 g 5  . Bioflavonoid Products (ESTER C PO)  Take by mouth daily.    Marland Kitchen BYSTOLIC 10 MG tablet TAKE 1 TABLET BY MOUTH EVERY DAY 90 tablet 2  . Carbinoxamine Maleate 4 MG TABS Take 1 and 1/2 tablet (total 6mg ) twice a day as needed for drainage. 45 tablet 2  . Cholecalciferol (VITAMIN D-3) 1000 UNITS CAPS Take 1 capsule by mouth daily.    Marland Kitchen CINNAMON PO Take 1,000 mg by mouth daily.     . Fluoxetine HCl, PMDD, 10 MG TABS USE ONE HALF TABLET DAILY FOR 8 DAYS THEN INCREASE TO 1 TABLET DAILY    . hydrochlorothiazide (HYDRODIURIL) 12.5 MG tablet TAKE 1 TABLET (12.5 MG TOTAL) BY MOUTH AS NEEDED (SBP > 150). 90 tablet 1  . MAGNESIUM MALATE PO Take 1 tablet by mouth daily.     . Misc Natural Products (APPLE CIDER VINEGAR DIET PO) Goli Gummies 2 gummies BID.    . Multiple Vitamins-Minerals (MULTIVITAMIN WITH MINERALS) tablet Take 1 tablet by mouth daily.    . pantoprazole (PROTONIX) 40 MG tablet TAKE 1 TABLET BY MOUTH 2 TIMES DAILY BEFORE A MEAL. 60 tablet 2  . Specialty Vitamins Products (VITAMINS FOR THE HAIR PO) Take 1 tablet by mouth daily.    . sucralfate (CARAFATE) 1 g tablet Take 1 tablet (1 g total) by mouth 4 (four) times daily -  with meals and at bedtime. Slowly dissolve 1 tablet in 1 tablespoon of distilled water before taking. 120 tablet 1  . zinc gluconate 50 MG tablet Take 50 mg by mouth daily.    Marland Kitchen amLODipine (NORVASC) 2.5 MG tablet Take 1 tablet (2.5 mg total) by mouth 2 (two) times daily. 180 tablet 3  . azelastine (OPTIVAR) 0.05 % ophthalmic solution Place 1 drop into both eyes 2 (two) times daily as needed (itchy/watery eyes). 6 mL 5   No current facility-administered medications for this visit.   Allergies: Allergies  Allergen Reactions  . Losartan Shortness Of Breath    Dizziness,  Shortness of breath   . Anesthetics, Amide Nausea And Vomiting  . Benadryl [Diphenhydramine Hcl (Sleep)]     Made feel paralyzed when had benadryl with a cocktail for procedure  Decadron, bendayl, regaln combo at [facility] 2014- see care every where    . Benicar [Olmesartan]     Presyncopal , light headed   . Cardura [Doxazosin Mesylate] Other (See Comments)    "makes me feel terrible"  . Codeine     Other reaction(s): Unknown  . Fentanyl Hypertension    Severe htn   . Losartan Potassium-Hctz     Dizziness,  Shortness of breath   . Metformin And Related     "out of body experience"    . Prilocaine Nausea And Vomiting   I reviewed her past medical history, social history, family history, and environmental history and no significant changes have been reported from her previous visit.  Review of Systems  Constitutional: Negative for appetite change, chills, fever and unexpected weight change.  HENT: Positive for postnasal drip and rhinorrhea. Negative for congestion.   Eyes: Positive for itching.  Respiratory: Negative for cough, chest tightness, shortness of breath and wheezing.   Cardiovascular: Negative for chest pain.  Genitourinary: Negative for difficulty urinating.  Skin: Negative for rash.  Allergic/Immunologic: Positive for environmental allergies. Negative for food allergies.  Neurological: Positive for dizziness and headaches.   Objective: BP 124/86   Pulse (!) 58   Resp 18   Ht 5\' 4"  (1.626 m)   LMP 12/01/2013 (Approximate)   SpO2 98%   BMI 33.13 kg/m  Body mass index is 33.13 kg/m. Physical Exam  Constitutional: She is oriented to person, place, and time. She appears well-developed and well-nourished.  HENT:  Head: Normocephalic and atraumatic.  Right Ear: External ear normal.  Left Ear: External ear normal.  Nose: Nose normal.  Mouth/Throat: Oropharynx is clear and moist.  Eyes: Conjunctivae and EOM are normal.  Cardiovascular: Normal rate, regular rhythm and normal heart sounds. Exam reveals no gallop and no friction rub.  No murmur heard. Pulmonary/Chest: Effort normal and breath sounds normal. She has no wheezes. She has no rales.  Abdominal: Soft.  Musculoskeletal:     Cervical back: Neck supple.   Neurological: She is alert and oriented to person, place, and time.  Skin: Skin is warm. No rash noted.  Psychiatric: She has a normal mood and affect. Her behavior is normal.  Nursing note and vitals reviewed.  Previous notes and tests were reviewed. The plan was reviewed with the patient/family, and all questions/concerned were addressed.  It was my pleasure to see Nicole Gould today and participate in her care. Please feel free to contact me with any questions or concerns.  Sincerely,  Rexene Alberts, DO Allergy & Immunology  [facility] office: [phone] [facility] office: [place] office: [phone]

## 2019-12-01 ENCOUNTER — Other Ambulatory Visit: Payer: Self-pay

## 2019-12-01 ENCOUNTER — Ambulatory Visit
Admission: EM | Admit: 2019-12-01 | Discharge: 2019-12-01 | Disposition: A | Discharge status: Home / Self Care | Payer: No Typology Code available for payment source

## 2019-12-01 ENCOUNTER — Telehealth: Payer: Self-pay | Admitting: Physician Assistant

## 2019-12-01 DIAGNOSIS — G44209 Tension-type headache, unspecified, not intractable: Secondary | ICD-10-CM | POA: Diagnosis not present

## 2019-12-01 DIAGNOSIS — W2201XA Walked into wall, initial encounter: Secondary | ICD-10-CM | POA: Diagnosis not present

## 2019-12-01 NOTE — ED Provider Notes (Signed)
[name]    CSN: [id number] Arrival date & time: 12/01/19  0910      History   Chief Complaint Chief Complaint  Patient presents with  . Headache    HPI Chemeka Filice is a 59 y.o. female with history of migraines, concussion, seizures, hypertension presenting for evaluation of possible concussion.  Patient states she walked into a glass door on Saturday: Hit the front of her head.  Denies LOC, memory loss, nausea, vomiting.  No change in vision, hearing, dizziness.  States she has had some frontal bilateral temporal headache since.  Has taken Tylenol with some relief.  Patient denies significant head pain: States this feels different than last time she had a concussion, for which she was followed by neurology at [facility].  No numbness, weakness, chest pain, difficulty breathing.   Past Medical History:  Diagnosis Date  . Allergy   . Anxiety   . Chicken pox   . Chronic Dizziness   . Colon polyps   . Concussion   . Frequent headaches   . GERD (gastroesophageal reflux disease)   . History of stress test    a. 2005 - in setting of ectopy - reportedly nl.  . Hypertension    a. 03/2018 24hr ABM: Mean BP 146/75, mean HR 61; Mean awake BP/HR 150/78, 62; Mean sleep BP/HR 130/65, 58.  . Migraines   . Pre-eclampsia    with grand mal seizures  . Seizure disorder in pregnancy ([place])   . Seizures ([place])    with pregnancy 1981- pre clampsia     Patient Active Problem List   Diagnosis Date Noted  . Gastroesophageal reflux disease 08/30/2019  . Hyperlipidemia 06/26/2019  . Dysesthesia 06/24/2019  . Dry mouth, unspecified 06/24/2019  . Vaccine counseling 06/24/2019  . Obesity (BMI 30.0-34.9) 05/06/2019  . Other allergic rhinitis 05/06/2019  . Insomnia due to stress, anxiety and fear 06/06/2018  . Noncompliance with diet and medication regimen 04/21/2018  . Mood disorder ([place]) 03/07/2018  . Polyp of corpus uteri 02/08/2018  . Hx of seizure disorder 11/10/2017  .  Migraine without aura and without status migrainosus, not intractable 11/10/2017  . Prediabetes 11/10/2017  . Spells of trembling 11/10/2017  . Near syncope 10/13/2017  . Sinusitis 07/16/2017  . Edema 04/29/2017  . 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 Dizziness 10/08/2016  . Obesity with body mass index 30 or greater 08/09/2016  . Vaginal discharge 08/09/2016  . History of adenomatous polyp of colon 08/04/2016  . Seasonal and perennial allergic rhinoconjunctivitis 07/12/2016  . 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 Menopausal symptom 10/25/2014  . Primary insomnia 05/05/2014  . Anxiety 05/05/2014  . Chronic venous insufficiency 04/21/2014  . Adenomatous polyp of colon 05/12/2013  . Visit for preventive health examination 03/14/2013  . Pain 03/11/2013  . Essential hypertension, benign 01/28/2013  . Vulvitis 01/28/2013  . 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 COLONOSCOPY    . DILATION AND CURETTAGE OF UTERUS  09/08/2014   with hystereoscopy  . POLYPECTOMY    . TONSILLECTOMY    . TONSILLECTOMY    . TUBAL LIGATION      OB History   No obstetric history on file.      Home Medications    Prior to Admission medications   Medication Sig Start Date End Date Taking? Authorizing Provider  amLODipine (  NORVASC) 2.5 MG tablet Take 1 tablet (2.5 mg total) by mouth 2 (two) times daily. 07/17/19 10/15/19  Rise Mu, PA-C  azelastine (OPTIVAR) 0.05 % ophthalmic solution Place 1 drop into both eyes 2 (two) times daily as needed (itchy/watery eyes). 11/10/19   Garnet Sierras, DO  Azelastine-Fluticasone [phone] MCG/ACT SUSP Place 1 spray into the nose in the morning and at bedtime. 09/03/19   Garnet Sierras, DO  Bioflavonoid Products (ESTER C PO) Take by mouth daily.    [provider]  BYSTOLIC 10 MG tablet TAKE 1 TABLET BY  MOUTH EVERY DAY 09/24/19   Minna Merritts, MD  Carbinoxamine Maleate 4 MG TABS Take 1 and 1/2 tablet (total 6mg ) twice a day as needed for drainage. 09/03/19   Garnet Sierras, DO  Cholecalciferol (VITAMIN D-3) 1000 UNITS CAPS Take 1 capsule by mouth daily.    [provider]  CINNAMON PO Take 1,000 mg by mouth daily.     [provider]  Fluoxetine HCl, PMDD, 10 MG TABS USE ONE HALF TABLET DAILY FOR 8 DAYS THEN INCREASE TO 1 TABLET DAILY 02/08/18   [provider]  hydrochlorothiazide (HYDRODIURIL) 12.5 MG tablet TAKE 1 TABLET (12.5 MG TOTAL) BY MOUTH AS NEEDED (SBP > 150). 04/28/19 09/03/27  Rise Mu, PA-C  MAGNESIUM MALATE PO Take 1 tablet by mouth daily.     [provider]  Misc Natural Products (APPLE CIDER VINEGAR DIET PO) Goli Gummies 2 gummies BID.    [provider]  Multiple Vitamins-Minerals (MULTIVITAMIN WITH MINERALS) tablet Take 1 tablet by mouth daily.    [provider]  pantoprazole (PROTONIX) 40 MG tablet TAKE 1 TABLET BY MOUTH 2 TIMES DAILY BEFORE A MEAL. 09/11/19   Pyrtle, Lajuan Lines, MD  Specialty Vitamins Products (VITAMINS FOR THE HAIR PO) Take 1 tablet by mouth daily.    [provider]  sucralfate (CARAFATE) 1 g tablet Take 1 tablet (1 g total) by mouth 4 (four) times daily -  with meals and at bedtime. Slowly dissolve 1 tablet in 1 tablespoon of distilled water before taking. 09/01/19   Pyrtle, Lajuan Lines, MD  zinc gluconate 50 MG tablet Take 50 mg by mouth daily.    [provider]    Family History Family History  Problem Relation Age of Onset  . Hypertension Mother   . Diabetes Mother   . Heart disease Father   . Kidney disease Father   . Hypertension Father   . Diabetes Father   . Cancer Maternal Grandfather        colon cancer  . Colon cancer Maternal Grandfather        passed age 50   . Colon polyps Neg Hx   . Breast cancer Neg Hx   . Esophageal cancer Neg Hx   . Rectal cancer Neg Hx   . Stomach  cancer Neg Hx     Social History Social History   Tobacco Use  . Smoking status: Never Smoker  . Smokeless tobacco: Never Used  Vaping Use  . Vaping Use: Never used  Substance Use Topics  . Alcohol use: Yes    Comment: very rare - glass red wine when that   . Drug use: No     Allergies   Losartan; Anesthetics, amide; Benadryl [diphenhydramine hcl (sleep)]; Benicar [olmesartan]; Cardura [doxazosin mesylate]; Codeine; Fentanyl; Losartan potassium-hctz; Metformin and related; and Prilocaine   Review of Systems As per HPI   Physical Exam  Triage Vital Signs ED Triage Vitals  Enc Vitals Group     BP      Pulse      Resp      Temp      Temp src      SpO2      Weight      Height      Head Circumference      Peak Flow      Pain Score      Pain Loc      Pain Edu?      Excl. in [place]?    No data found.  Updated Vital Signs BP (!) 159/81   Pulse (!) 57   Temp 97.8 F (36.6 C)   Resp 16   LMP 12/01/2013 (Approximate)   SpO2 98%   Visual Acuity Right Eye Distance:   Left Eye Distance:   Bilateral Distance:    Right Eye Near:   Left Eye Near:    Bilateral Near:     Physical Exam Constitutional:      General: She is not in acute distress. HENT:     Head: Normocephalic and atraumatic.     Right Ear: Tympanic membrane, ear canal and external ear normal.     Left Ear: Tympanic membrane, ear canal and external ear normal.     Ears:     Comments: Negative hemotympanum bilaterally    Mouth/Throat:     Mouth: Mucous membranes are moist.     Pharynx: Oropharynx is clear.  Eyes:     General: No scleral icterus.    Extraocular Movements: Extraocular movements intact.     Conjunctiva/sclera: Conjunctivae normal.     Pupils: Pupils are equal, round, and reactive to light.  Cardiovascular:     Rate and Rhythm: Normal rate.  Pulmonary:     Effort: Pulmonary effort is normal. No respiratory distress.     Breath sounds: No wheezing.  Musculoskeletal:         General: No deformity. Normal range of motion.     Cervical back: Normal range of motion. No rigidity or tenderness.  Lymphadenopathy:     Cervical: No cervical adenopathy.  Skin:    Capillary Refill: Capillary refill takes less than 2 seconds.     Coloration: Skin is not jaundiced.     Findings: No bruising or rash.  Neurological:     Mental Status: She is alert.     Cranial Nerves: Cranial nerves are intact.     Sensory: Sensation is intact.     Motor: Motor function is intact.     Coordination: Coordination is intact.     Gait: Gait is intact.  Psychiatric:        Mood and Affect: Mood normal.        Behavior: Behavior normal.      UC Treatments / Results  Labs (all labs ordered are listed, but only abnormal results are displayed) Labs Reviewed - No data to display  EKG   Radiology No results found.  Procedures Procedures (including critical care time)  Medications Ordered in UC Medications - No data to display  Initial Impression / Assessment and Plan / UC Course  I have reviewed the triage vital signs and the nursing notes.  Pertinent labs & imaging results that were available during my care of the patient were reviewed by me and considered in my medical decision making (see chart for details).     Patient febrile, nontoxic, and hemodynamically stable  in office.  No neurocognitive deficit on exam.  Offered headache medications in office: Patient declined as she feels it is not very severe.  Provided general education on TBI, concussion, postconcussive symptoms.  No current change in medications at this time.  Will follow up with PCP and neurology as needed for migraines.  Return precautions discussed, patient verbalized understanding and is agreeable to plan. Final Clinical Impressions(s) / UC Diagnoses   Final diagnoses:  Walked into wall, initial encounter  Tension headache     Discharge Instructions     For pain: recommend 350 mg-1000 mg of Tylenol  (acetaminophen) and/or 200 mg - 800 mg of Advil (ibuprofen, Motrin) every 8 hours as needed.  May alternate between the two throughout the day as they are generally safe to take together.  DO NOT exceed more than 3000 mg of Tylenol or 3200 mg of ibuprofen in a 24 hour period as this could damage your stomach, kidneys, liver, or increase your bleeding risk.    ED Prescriptions    None     PDMP not reviewed this encounter.   Neldon Mc [place], [place] 12/01/19 [phone]

## 2019-12-01 NOTE — Discharge Instructions (Signed)
For pain: recommend 350 mg-1000 mg of Tylenol (acetaminophen) and/or 200 mg - 800 mg of Advil (ibuprofen, Motrin) every 8 hours as needed.  May alternate between the two throughout the day as they are generally safe to take together.  DO NOT exceed more than 3000 mg of Tylenol or 3200 mg of ibuprofen in a 24 hour period as this could damage your stomach, kidneys, liver, or increase your bleeding risk.

## 2019-12-01 NOTE — Telephone Encounter (Signed)
Patient called states that she walked into a glass door Saturday and hit her head very hard. She states she hit so hard it 'stunned' her.   Patient states Sunday she had a really bad headache and she is concerned that she has a concussion.   I advised patient unfortunately we do not have any available appointments today or tomorrow and that since she is concerned and does have a headache that she should go to UC for evaluation.   Patient verbalized understanding and was agreeable.    Herb Grays is aware and agreed with advise.   AS, CMA

## 2019-12-01 NOTE — ED Triage Notes (Signed)
Pt walked into a glass door on Saturday, denies LOC, "I saw stars"

## 2019-12-12 ENCOUNTER — Telehealth: Payer: Self-pay | Admitting: Physician Assistant

## 2019-12-12 ENCOUNTER — Ambulatory Visit
Admission: EM | Admit: 2019-12-12 | Discharge: 2019-12-12 | Disposition: A | Discharge status: Home / Self Care | Payer: No Typology Code available for payment source | Attending: Physician Assistant | Admitting: Physician Assistant

## 2019-12-12 ENCOUNTER — Other Ambulatory Visit: Payer: Self-pay

## 2019-12-12 DIAGNOSIS — Z5189 Encounter for other specified aftercare: Secondary | ICD-10-CM

## 2019-12-12 DIAGNOSIS — W57XXXA Bitten or stung by nonvenomous insect and other nonvenomous arthropods, initial encounter: Secondary | ICD-10-CM

## 2019-12-12 MED ORDER — TRIAMCINOLONE ACETONIDE 0.5 % EX CREA: 1.0000 "application " | TOPICAL_CREAM | Freq: Three times a day (TID) | CUTANEOUS | 0 refills | Status: DC

## 2019-12-12 NOTE — ED Triage Notes (Signed)
Pt states has a bug bite to the back of LLL, rt foot, and lt hand since last Saturday. Raised red area noted to back of LLL

## 2019-12-12 NOTE — Telephone Encounter (Signed)
Patient called states bitten by something spider or mosquito last Saturday while dining outdoors--Per patient its now red & swelling and very itchy, she is requesting Appt for examination (advised no appt available ) advised her to go to nearest [facility] . ---Per pt not experiencing any difficulty w/ swallowing or facial swelling just angry insect bites on hands & legs.  ---forwarding to med asst as fyi.  --glh

## 2019-12-12 NOTE — ED Provider Notes (Signed)
[name]    CSN: [id number] Arrival date & time: 12/12/19  1226      History   Chief Complaint Chief Complaint  Patient presents with  . Insect Bite    HPI Nicole Gould is a 59 y.o. female.   59 year old female comes in for insect bites that occurred 1 week ago. Had insect bites to BLE, left hand. Itching in sensation. States one of the insect bite to the posterior left lower leg is now erythematous, swollen, and therefore came in for evaluation. Denies pain, fever, drainage.      Past Medical History:  Diagnosis Date  . Allergy   . Anxiety   . Chicken pox   . Chronic Dizziness   . Colon polyps   . Concussion   . Frequent headaches   . GERD (gastroesophageal reflux disease)   . History of stress test    a. 2005 - in setting of ectopy - reportedly nl.  . Hypertension    a. 03/2018 24hr ABM: Mean BP 146/75, mean HR 61; Mean awake BP/HR 150/78, 62; Mean sleep BP/HR 130/65, 58.  . Migraines   . Pre-eclampsia    with grand mal seizures  . Seizure disorder in pregnancy ([place])   . Seizures ([place])    with pregnancy 1981- pre clampsia     Patient Active Problem List   Diagnosis Date Noted  . Gastroesophageal reflux disease 08/30/2019  . Hyperlipidemia 06/26/2019  . Dysesthesia 06/24/2019  . Dry mouth, unspecified 06/24/2019  . Vaccine counseling 06/24/2019  . Obesity (BMI 30.0-34.9) 05/06/2019  . Other allergic rhinitis 05/06/2019  . Insomnia due to stress, anxiety and fear 06/06/2018  . Noncompliance with diet and medication regimen 04/21/2018  . Mood disorder ([place]) 03/07/2018  . Polyp of corpus uteri 02/08/2018  . Hx of seizure disorder 11/10/2017  . Migraine without aura and without status migrainosus, not intractable 11/10/2017  . Prediabetes 11/10/2017  . Spells of trembling 11/10/2017  . Near syncope 10/13/2017  . Sinusitis 07/16/2017  . Edema 04/29/2017  . 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 Reactive hypoglycemia 03/25/2017    . 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 Dizziness 10/08/2016  . Obesity with body mass index 30 or greater 08/09/2016  . Vaginal discharge 08/09/2016  . History of adenomatous polyp of colon 08/04/2016  . Seasonal and perennial allergic rhinoconjunctivitis 07/12/2016  . 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 Menopausal symptom 10/25/2014  . Primary insomnia 05/05/2014  . Anxiety 05/05/2014  . Chronic venous insufficiency 04/21/2014  . Adenomatous polyp of colon 05/12/2013  . Visit for preventive health examination 03/14/2013  . Pain 03/11/2013  . Essential hypertension, benign 01/28/2013  . Vulvitis 01/28/2013  . 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 COLONOSCOPY    . DILATION AND CURETTAGE OF UTERUS  09/08/2014   with hystereoscopy  . POLYPECTOMY    . TONSILLECTOMY    . TONSILLECTOMY    . TUBAL LIGATION      OB History   No obstetric history on file.      Home Medications    Prior to Admission medications   Medication Sig Start Date End Date Taking? Authorizing Provider  amLODipine (NORVASC) 2.5 MG tablet Take 1 tablet (2.5 mg total) by mouth 2 (two) times daily. 07/17/19 10/15/19  Rise Mu, PA-C  azelastine (OPTIVAR) 0.05 % ophthalmic solution Place 1 drop into both eyes 2 (two) times  daily as needed (itchy/watery eyes). 11/10/19   Garnet Sierras, DO  Azelastine-Fluticasone [phone] MCG/ACT SUSP Place 1 spray into the nose in the morning and at bedtime. 09/03/19   Garnet Sierras, DO  Bioflavonoid Products (ESTER C PO) Take by mouth daily.    [provider]  BYSTOLIC 10 MG tablet TAKE 1 TABLET BY MOUTH EVERY DAY 09/24/19   Minna Merritts, MD  Carbinoxamine Maleate 4 MG TABS Take 1 and 1/2 tablet (total 6mg ) twice a day as needed for drainage. 09/03/19   Garnet Sierras, DO  Cholecalciferol (VITAMIN D-3) 1000 UNITS CAPS Take 1 capsule by mouth daily.    [provider]  CINNAMON PO  Take 1,000 mg by mouth daily.     [provider]  Fluoxetine HCl, PMDD, 10 MG TABS USE ONE HALF TABLET DAILY FOR 8 DAYS THEN INCREASE TO 1 TABLET DAILY 02/08/18   [provider]  hydrochlorothiazide (HYDRODIURIL) 12.5 MG tablet TAKE 1 TABLET (12.5 MG TOTAL) BY MOUTH AS NEEDED (SBP > 150). 04/28/19 09/03/27  Rise Mu, PA-C  MAGNESIUM MALATE PO Take 1 tablet by mouth daily.     [provider]  Misc Natural Products (APPLE CIDER VINEGAR DIET PO) Goli Gummies 2 gummies BID.    [provider]  Multiple Vitamins-Minerals (MULTIVITAMIN WITH MINERALS) tablet Take 1 tablet by mouth daily.    [provider]  pantoprazole (PROTONIX) 40 MG tablet TAKE 1 TABLET BY MOUTH 2 TIMES DAILY BEFORE A MEAL. 09/11/19   Pyrtle, Lajuan Lines, MD  Specialty Vitamins Products (VITAMINS FOR THE HAIR PO) Take 1 tablet by mouth daily.    [provider]  sucralfate (CARAFATE) 1 g tablet Take 1 tablet (1 g total) by mouth 4 (four) times daily -  with meals and at bedtime. Slowly dissolve 1 tablet in 1 tablespoon of distilled water before taking. 09/01/19   Pyrtle, Lajuan Lines, MD  triamcinolone cream (KENALOG) 0.5 % Apply 1 application topically 3 (three) times daily. 12/12/19   Tasia Catchings, Renna Kilmer V, PA-C  zinc gluconate 50 MG tablet Take 50 mg by mouth daily.    [provider]    Family History Family History  Problem Relation Age of Onset  . Hypertension Mother   . Diabetes Mother   . Heart disease Father   . Kidney disease Father   . Hypertension Father   . Diabetes Father   . Cancer Maternal Grandfather        colon cancer  . Colon cancer Maternal Grandfather        passed age 92   . Colon polyps Neg Hx   . Breast cancer Neg Hx   . Esophageal cancer Neg Hx   . Rectal cancer Neg Hx   . Stomach cancer Neg Hx     Social History Social History   Tobacco Use  . Smoking status: Never Smoker  . Smokeless tobacco: Never Used  Vaping Use  . Vaping Use: Never used   Substance Use Topics  . Alcohol use: Yes    Comment: very rare - glass red wine when that   . Drug use: No     Allergies   Losartan; Anesthetics, amide; Benadryl [diphenhydramine hcl (sleep)]; Benicar [olmesartan]; Cardura [doxazosin mesylate]; Codeine; Fentanyl; Losartan potassium-hctz; Metformin and related; and Prilocaine   Review of Systems Review of Systems  Reason unable to perform ROS: See HPI as above.     Physical Exam Triage Vital Signs ED Triage Vitals  Enc Vitals Group     BP 12/12/19 1236 140/78     Pulse Rate 12/12/19 1236 65     Resp 12/12/19 1236 16     Temp 12/12/19 1236 97.9 F (36.6 C)     Temp Source 12/12/19 1236 Oral     SpO2 12/12/19 1236 99 %     Weight --      Height --      Head Circumference --      Peak Flow --      Pain Score 12/12/19 1239 1     Pain Loc --      Pain Edu? --      Excl. in [place]? --    No data found.  Updated Vital Signs BP 140/78 (BP Location: Left Arm)   Pulse 65   Temp 97.9 F (36.6 C) (Oral)   Resp 16   LMP 12/01/2013 (Approximate)   SpO2 99%   Physical Exam Constitutional:      General: She is not in acute distress.    Appearance: Normal appearance. She is well-developed. She is not toxic-appearing or diaphoretic.  HENT:     Head: Normocephalic and atraumatic.  Eyes:     Conjunctiva/sclera: Conjunctivae normal.     Pupils: Pupils are equal, round, and reactive to light.  Pulmonary:     Effort: Pulmonary effort is normal. No respiratory distress.     Comments: Speaking in full sentences without difficulty Musculoskeletal:     Cervical back: Normal range of motion and neck supple.  Skin:    General: Skin is warm and dry.     Comments: Central scabbing with surrounding maculopapular rash, erythema. No warmth, induration, fluctuance. No tenderness to palpation.  Neurological:     Mental Status: She is alert and oriented to person, place, and time.      UC Treatments / Results  Labs (all labs ordered  are listed, but only abnormal results are displayed) Labs Reviewed - No data to display  EKG   Radiology No results found.  Procedures Procedures (including critical care time)  Medications Ordered in UC Medications - No data to display  Initial Impression / Assessment and Plan / UC Course  I have reviewed the triage vital signs and the nursing notes.  Pertinent labs & imaging results that were available during my care of the patient were reviewed by me and considered in my medical decision making (see chart for details).    No signs of bacterial infection. Will treat symptomatically with triamcinolone. Ice compress. Return precautions given.  Final Clinical Impressions(s) / UC Diagnoses   Final diagnoses:  Visit for wound check  Multiple insect bites   ED Prescriptions    Medication Sig Dispense Auth. Provider   triamcinolone cream (KENALOG) 0.5 % Apply 1 application topically 3 (three) times daily. 30 g Ok Edwards, PA-C     PDMP not reviewed this encounter.   Ok Edwards, PA-C 12/12/19 1414

## 2019-12-12 NOTE — Discharge Instructions (Signed)
Start triamcinolone cream as directed. Ice compress. Monitor for spreading redness, warmth, pain, fever.

## 2019-12-27 ENCOUNTER — Other Ambulatory Visit: Payer: Self-pay | Admitting: Physician Assistant

## 2019-12-29 ENCOUNTER — Other Ambulatory Visit: Payer: Self-pay

## 2019-12-29 MED ORDER — AMLODIPINE BESYLATE 5 MG PO TABS
2.5000 mg | ORAL_TABLET | Freq: Two times a day (BID) | ORAL | 0 refills | Status: DC
Start: 2019-12-29 — End: 2021-02-01

## 2019-12-29 NOTE — Telephone Encounter (Signed)
Received fax from [facility] [phone] stating amlodipine 2.5mg  BID is not covered. Insurance requires 1 tablet per day of higher strength. Rx changed to amlodipine 5mg  1/2 tablet BID and sent to pharmacy electronically.

## 2019-12-30 ENCOUNTER — Ambulatory Visit: Payer: No Typology Code available for payment source | Admitting: Internal Medicine

## 2020-01-15 ENCOUNTER — Telehealth: Payer: No Typology Code available for payment source | Admitting: Internal Medicine

## 2020-01-23 DIAGNOSIS — F0781 Postconcussional syndrome: Secondary | ICD-10-CM | POA: Insufficient documentation

## 2020-02-11 ENCOUNTER — Ambulatory Visit: Payer: No Typology Code available for payment source | Admitting: Allergy

## 2020-02-11 NOTE — Progress Notes (Deleted)
Follow Up Note  RE: Doreen Garretson MRN: [id number] DOB: 12-Feb-1961 Date of Office Visit: 02/11/2020  Referring provider: Lorrene Reid, PA-C Primary care provider: [name], MD  Chief Complaint: No chief complaint on file.  History of Present Illness: I had the pleasure of seeing [name] for a follow up visit at the [facility] on 02/11/2020. She is a 59 y.o. female, who is being followed for allergic rhinoconjunctivitis. Her previous allergy office visit was on 11/10/2019 with Dr. Maudie Mercury. Today is a regular follow up visit.  Seasonal and perennial allergic rhinoconjunctivitis Past history - 2021 allergy testing showed: positives to grass pollen, tree pollen, ragweed, weed pollen, cockroaches.  Interim history - questionable reaction to generic carbinoxamine in the form of facial rash, Ryvent worked well but Teacher, adult education.  Still having PND and itchy eyes.  Continue dymista (fluticasone + azelastine nasal spray combination) 1 spray per nostril twice a day. ? Demonstrated proper use.   Try Xyzal 5mg  or allegra 180mg  at night and see if it helps with the post nasal drainage. Samples given.  If it's not as helpful as Ryvent then I will try to get Ryvent approved. ? No samples in the office today.  May use azelastine eye drops twice a day as needed for itchy/watery eyes.  Continue environmental control measures as below.  Read about allergy injections. Let us know when ready to start.  Had a detailed discussion with patient/family that clinical history is suggestive of allergic rhinitis, and may benefit from allergy immunotherapy (AIT). Discussed in detail regarding the dosing, schedule, side effects (mild to moderate local allergic reaction and rarely systemic allergic reactions including anaphylaxis), and benefits (significant improvement in nasal symptoms, seasonal flares of asthma) of immunotherapy with the patient. There is significant  time commitment involved with allergy shots, which includes weekly immunotherapy injections for first 9-12 months and then biweekly to monthly injections for 3-5 years.   Dizziness Past history - Patient has ongoing complaints of dizziness for many years. She was evaluated by endocrinology as she was having issues with her blood glucose levels, follows with cardiology for hypertension and neurology for headaches/migraines. She also saw ENT in the past. To date no clear etiology found for her dizziness. 2021 spirometry today was normal. Interim history - unchanged.   Monitor symptoms.   Return in about 3 months (around 02/10/2020).  Assessment and Plan: Aailyah is a 59 y.o. female with: No problem-specific Assessment & Plan notes found for this encounter.  No follow-ups on file.  No orders of the defined types were placed in this encounter.  Lab Orders  No laboratory test(s) ordered today    Diagnostics: Spirometry:  Tracings reviewed. Her effort: {Blank single:19197::"Good reproducible efforts.","It was hard to get consistent efforts and there is a question as to whether this reflects a maximal maneuver.","Poor effort, data can not be interpreted."} FVC: ***L FEV1: ***L, ***% predicted FEV1/FVC ratio: ***% Interpretation: {Blank single:19197::"Spirometry consistent with mild obstructive disease","Spirometry consistent with moderate obstructive disease","Spirometry consistent with severe obstructive disease","Spirometry consistent with possible restrictive disease","Spirometry consistent with mixed obstructive and restrictive disease","Spirometry uninterpretable due to technique","Spirometry consistent with normal pattern","No overt abnormalities noted given today's efforts"}.  Please see scanned spirometry results for details.  Skin Testing: {Blank single:19197::"Select foods","Environmental allergy panel","Environmental allergy panel and select foods","Food allergy  panel","None","Deferred due to recent antihistamines use"}. Positive test to: ***. Negative test to: ***.  Results discussed with patient/family.   Medication List:  Current Outpatient  Medications  Medication Sig Dispense Refill  . amLODipine (NORVASC) 5 MG tablet Take 0.5 tablets (2.5 mg total) by mouth in the morning and at bedtime. 90 tablet 0  . azelastine (OPTIVAR) 0.05 % ophthalmic solution Place 1 drop into both eyes 2 (two) times daily as needed (itchy/watery eyes). 6 mL 5  . Azelastine-Fluticasone 137-50 MCG/ACT SUSP Place 1 spray into the nose in the morning and at bedtime. 23 g 5  . BYSTOLIC 10 MG tablet TAKE 1 TABLET BY MOUTH EVERY DAY 90 tablet 2  . Carbinoxamine Maleate 4 MG TABS Take 1 and 1/2 tablet (total 6mg ) twice a day as needed for drainage. 45 tablet 2  . Cholecalciferol (VITAMIN D-3) 1000 UNITS CAPS Take 1 capsule by mouth daily.    Marland Kitchen CINNAMON PO Take 1,000 mg by mouth daily.     . hydrochlorothiazide (HYDRODIURIL) 12.5 MG tablet TAKE 1 TABLET (12.5 MG TOTAL) BY MOUTH AS NEEDED (SBP > 150). 90 tablet 1  . Misc Natural Products (APPLE CIDER VINEGAR DIET PO) Goli Gummies 2 gummies BID.    . Multiple Vitamins-Minerals (MULTIVITAMIN WITH MINERALS) tablet Take 1 tablet by mouth daily.    . pantoprazole (PROTONIX) 40 MG tablet TAKE 1 TABLET BY MOUTH 2 TIMES DAILY BEFORE A MEAL. 60 tablet 2  . triamcinolone cream (KENALOG) 0.5 % Apply 1 application topically 3 (three) times daily. 30 g 0  . zinc gluconate 50 MG tablet Take 50 mg by mouth daily.     No current facility-administered medications for this visit.   Allergies: Allergies  Allergen Reactions  . Losartan Shortness Of Breath    Dizziness,  Shortness of breath   . Anesthetics, Amide Nausea And Vomiting  . Benadryl [Diphenhydramine Hcl (Sleep)]     Made feel paralyzed when had benadryl with a cocktail for procedure  Decadron, bendayl, regaln combo at [facility] 2014- see care every where   . Benicar [Olmesartan]      Presyncopal , light headed   . Cardura [Doxazosin Mesylate] Other (See Comments)    "makes me feel terrible"  . Codeine     Other reaction(s): Unknown  . Fentanyl Hypertension    Severe htn   . Losartan Potassium-Hctz     Dizziness,  Shortness of breath   . Metformin And Related     "out of body experience"    . Prilocaine Nausea And Vomiting   I reviewed her past medical history, social history, family history, and environmental history and no significant changes have been reported from her previous visit.  Review of Systems  Constitutional: Negative for appetite change, chills, fever and unexpected weight change.  HENT: Positive for postnasal drip and rhinorrhea. Negative for congestion.   Eyes: Positive for itching.  Respiratory: Negative for cough, chest tightness, shortness of breath and wheezing.   Cardiovascular: Negative for chest pain.  Genitourinary: Negative for difficulty urinating.  Skin: Negative for rash.  Allergic/Immunologic: Positive for environmental allergies. Negative for food allergies.  Neurological: Positive for dizziness and headaches.   Objective: LMP 12/01/2013 (Approximate)  There is no height or weight on file to calculate BMI. Physical Exam Vitals and nursing note reviewed.  Constitutional:      Appearance: She is well-developed.  HENT:     Head: Normocephalic and atraumatic.     Right Ear: External ear normal.     Left Ear: External ear normal.     Nose: Nose normal.     Mouth/Throat:     Mouth: Mucous membranes  are moist.     Pharynx: Oropharynx is clear.  Eyes:     Conjunctiva/sclera: Conjunctivae normal.  Cardiovascular:     Rate and Rhythm: Normal rate and regular rhythm.     Heart sounds: Normal heart sounds. No murmur heard.  No friction rub. No gallop.   Pulmonary:     Effort: Pulmonary effort is normal.     Breath sounds: Normal breath sounds. No wheezing, rhonchi or rales.  Musculoskeletal:     Cervical back: Neck supple.    Skin:    General: Skin is warm.     Findings: No rash.  Neurological:     Mental Status: She is alert and oriented to person, place, and time.  Psychiatric:        Behavior: Behavior normal.    Previous notes and tests were reviewed. The plan was reviewed with the patient/family, and all questions/concerned were addressed.  It was my pleasure to see Precious today and participate in her care. Please feel free to contact me with any questions or concerns.  Sincerely,  Rexene Alberts, DO Allergy & Immunology  [facility] office: [phone] [facility] office: [place] office: [phone]

## 2020-02-20 ENCOUNTER — Other Ambulatory Visit: Payer: Self-pay

## 2020-03-28 ENCOUNTER — Ambulatory Visit
Admission: EM | Admit: 2020-03-28 | Discharge: 2020-03-28 | Disposition: A | Discharge status: Home / Self Care | Payer: No Typology Code available for payment source | Attending: Emergency Medicine | Admitting: Emergency Medicine

## 2020-03-28 ENCOUNTER — Other Ambulatory Visit: Payer: Self-pay

## 2020-03-28 ENCOUNTER — Encounter: Payer: Self-pay | Admitting: Emergency Medicine

## 2020-03-28 DIAGNOSIS — G44319 Acute post-traumatic headache, not intractable: Secondary | ICD-10-CM

## 2020-03-28 DIAGNOSIS — S46819A Strain of other muscles, fascia and tendons at shoulder and upper arm level, unspecified arm, initial encounter: Secondary | ICD-10-CM | POA: Diagnosis not present

## 2020-03-28 MED ORDER — NAPROXEN 500 MG PO TABS
500.0000 mg | ORAL_TABLET | Freq: Two times a day (BID) | ORAL | 0 refills | Status: DC
Start: 2020-03-28 — End: 2021-04-12

## 2020-03-28 MED ORDER — CYCLOBENZAPRINE HCL 5 MG PO TABS: 5.0000 mg | ORAL_TABLET | Freq: Two times a day (BID) | ORAL | 0 refills | Status: AC | PRN

## 2020-03-28 NOTE — Discharge Instructions (Signed)
Naproxen for headache. Follow up with headache doctor.
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
## 2020-03-31 DIAGNOSIS — G4733 Obstructive sleep apnea (adult) (pediatric): Secondary | ICD-10-CM | POA: Insufficient documentation

## 2020-03-31 DIAGNOSIS — G4730 Sleep apnea, unspecified: Secondary | ICD-10-CM | POA: Insufficient documentation

## 2020-04-09 ENCOUNTER — Telehealth: Payer: Self-pay | Admitting: Cardiovascular Disease

## 2020-04-09 MED ORDER — BYSTOLIC 10 MG PO TABS: 10.0000 mg | ORAL_TABLET | Freq: Every day | ORAL | 3 refills | Status: DC

## 2020-04-09 NOTE — Telephone Encounter (Signed)
Spoke with patient and reviewed that we did print prescription in order to get her name brand covered by discount card. Printed prescription with request to [facility] and note to identify that patient does not tolerate generic version. She was appreciative for the information with no further questions at this time.

## 2020-04-09 NOTE — Telephone Encounter (Signed)
Patient states she would like to continue to take Bystolic. States the generic for this is making her feel "weird". States she has to pay $300 to stay on this. Please call to discuss.

## 2020-04-12 NOTE — Telephone Encounter (Addendum)
Spoke with patient and she needs prior authorization for her Bystolic as the generic is making her fill terrible. She had been on name brand medication but price went up and they filled generic version. She reports that she was not able to afford the name brand and they provided the generic. She then started feeling bad, nausea, and just not feeling well at all. Given her non-tolerance of a number of medications she requested that we please do prior authorization so she can get it cheaper. Advised that I would get someone to work on that prior authorization and would be in touch. She was appreciative for the call with no further questions at this time.

## 2020-04-13 ENCOUNTER — Other Ambulatory Visit: Payer: Self-pay | Admitting: *Deleted

## 2020-04-13 ENCOUNTER — Telehealth: Payer: Self-pay | Admitting: *Deleted

## 2020-04-13 MED ORDER — BYSTOLIC 10 MG PO TABS: 10.0000 mg | ORAL_TABLET | Freq: Every day | ORAL | 3 refills | Status: DC

## 2020-04-13 NOTE — Telephone Encounter (Signed)
I spoke with pharmacy today to contact Covermymeds.  They mentioned that they don't have the medication at hand and have ordered the Brand name and should be awaiting shipment tomorrow.  Pharmacy said that at this time no further prior auth is needed at this time and they will contact office if anything else is needed.

## 2020-04-13 NOTE — Telephone Encounter (Signed)
Pt requesting PA for Bystolic to assist with cost for Brand name.  PA has been initiated through covermymeds and submitted.  Further information needed from pharmacy with indication for pharmacy to contact them.  Additional Information Required Please advise the dispensing pharmacy to contact the [place] at [phone] for assistance.  Pharmacy has been notified.

## 2020-04-13 NOTE — Telephone Encounter (Signed)
Pt requesting PA for Bystolic to assist with cost for Brand name.   PA has been initiated through covermymeds and submitted.   Further information needed from pharmacy with indication for pharmacy to contact them.   Additional Information Required Please advise the dispensing pharmacy to contact the [place] at [phone] for assistance.   Pharmacy has been notified.

## 2020-04-13 NOTE — Telephone Encounter (Signed)
Requested Prescriptions   Signed Prescriptions Disp Refills  . BYSTOLIC 10 MG tablet 90 tablet 3    Sig: Take 1 tablet (10 mg total) by mouth daily.    Authorizing Provider: Minna Merritts    Ordering User: Britt Bottom   PA has been attempted through covermymeds.  PA submitted with message indicating for pharmacy to contact them.  Additional Information Required Please advise the dispensing pharmacy to contact the [place] at [phone] for assistance.  Pharmacy has been notified.

## 2020-04-28 ENCOUNTER — Telehealth: Payer: Self-pay | Admitting: Cardiovascular Disease

## 2020-04-28 ENCOUNTER — Telehealth: Payer: Self-pay

## 2020-04-28 NOTE — Telephone Encounter (Signed)
Please call to discuss paperwork, PA for Bystolic.

## 2020-04-28 NOTE — Telephone Encounter (Signed)
Spoke with patient regaurding PA for Bystolic.  Patient stated she spoke with Pam about a week to a week in a half ago about the PA for Bystolic.  Patient states she has been waiting in the meantime and have not got a call back.  Patient also states that she was told that she can call and get samples to help with the cost and just asked me for some samples.  Made her aware we do not have any samples available at this time.   Confirmed pharmacy with patient - [facility] Made her aware I will call them and verify if a PA is needed or not.  [facility] Pharmacist stated that patient has a few refills left on her Bystolic.  She stated that she would be on day 16 on her current 30 day supply and can not request a refill until Nov 24th.  Price for this medication is $ 57.62  Asked If patient needs a prior auth for this medication and pharmacist stated [facility] has not sent any notification to her (the patient) or to [place] about needing a PA.  She stated since there are refills available she would not think a PA would be needed.   Pharmacist also stated that she picked up the last bottle of her medication with no problem and no PA.

## 2020-04-28 NOTE — Telephone Encounter (Addendum)
Pam, We are currently all out of Bystolic samples.  Patient is leaving in a few days to go out of the [place].

## 2020-04-28 NOTE — Telephone Encounter (Deleted)
previous message I put Brilinta - that was a mistake. I meant Bystolic.

## 2020-04-28 NOTE — Telephone Encounter (Signed)
Do you think you can call the patient and make her aware of my findings?

## 2020-04-28 NOTE — Telephone Encounter (Signed)
Nicole Gould called [place] to see who the representative is for our office. She was told they do not sample out Bystolic anymore.

## 2020-04-28 NOTE — Telephone Encounter (Signed)
Patient calling the office for samples of medication:   1.  What medication and dosage are you requesting samples for? Bystolic 10 mg daily  2.  Are you currently out of this medication? Running out and about to travel out of country.  Patient states Dr. Rockey Situ advised her to call in regarding samples to help with the cost  Please advise

## 2020-04-29 MED ORDER — BYSTOLIC 10 MG PO TABS
10.0000 mg | ORAL_TABLET | Freq: Every day | ORAL | 0 refills | Status: DC
Start: 2020-04-29 — End: 2020-04-29

## 2020-04-29 MED ORDER — BYSTOLIC 10 MG PO TABS
10.0000 mg | ORAL_TABLET | Freq: Every day | ORAL | 0 refills | Status: DC
Start: 2020-04-29 — End: 2020-05-19

## 2020-04-29 NOTE — Telephone Encounter (Signed)
Called and spoke with patient to make her aware we will no longer have Bystolic samples. Patient explained that her insurance company said that if Dr Rockey Situ would state that patient cannot take the generic and that medically necessary that patient takes the brand Bystolic, then they would honor a lower price (~ $30). Patient states she cannot afford to pay $57.62 a month.  She is frustrated that she has not heard from [facility] and I apologized I did not know where Pam is in the process of the appeal or Tier exception.  Patient really wants to have some pills with her as she travels in case she got stuck out of the country. We discussed the option of GoodRX coupon. I found coupon for 30-day supply for $48.66 or 14 tablets for $27.51 at [employer]. Patient is very happy with this option and satisfied to wait until after her trip to touch base with Pam about the process.  Rx sent to [facility] for patient.  Patient said the insurance company would either fax the paper for Dr Rockey Situ to fill out or Dr Rockey Situ could do it online.  Per patient, Pam opted to due the process online. I explained I was not sure where she was in the process but she could call her insurance company to have them fax the paper work and I would be glad to start the process.  Patient decided it was ok at this time and would wait to speak to [facility] after she returned from her trip of being out of the country.  Routing to [employer], RN inbasket for review when she returns.

## 2020-04-29 NOTE — Addendum Note (Signed)
Addended by: Annia Belt on: 04/29/2020 09:06 AM   Modules accepted: Orders

## 2020-05-17 DIAGNOSIS — M5416 Radiculopathy, lumbar region: Secondary | ICD-10-CM | POA: Insufficient documentation

## 2020-05-19 MED ORDER — BYSTOLIC 10 MG PO TABS: 10.0000 mg | ORAL_TABLET | Freq: Every day | ORAL | 3 refills | Status: DC

## 2020-05-19 NOTE — Telephone Encounter (Signed)
Spoke with patient and she is very upset that I was out and nobody was able to assist her. She did not understand why they didn't know the same processes for assisting her in getting the medication. She said that she talked with me prior to me being out and didn't know why I didn't finish it before leaving. Reviewed chart and stated that we last spoke on 10/25 and we did have it worked out at that time. Since then she has not been able to get medication due to sort of prior authorization. Attempted to do PA online while she remained on the line with me and she persisted being upset about the process and other she spoke with. She requested our managers number to express her wait for medication that she needs. Let her know that I would work on this and call her back with my findings.

## 2020-05-19 NOTE — Telephone Encounter (Signed)
Called pharmacy to review cost for her prescription with current insurance and it was $57 for the name brand.   Reviewed options for prescription to include co-pay card from Internet. Printed out co-pay card and will check with pharmacy to see if they can apply card to her medication. Patient requested that I please call her back once this has been done.

## 2020-05-19 NOTE — Addendum Note (Signed)
Addended by: Valora Corporal on: 05/19/2020 01:18 PM   Modules accepted: Orders

## 2020-05-19 NOTE — Telephone Encounter (Signed)
Spoke with Claiborne Billings at pharmacy to verify if prescription goes through for the co-pay price of $15 for 90 day supply.

## 2020-05-19 NOTE — Telephone Encounter (Signed)
Patient calling back stating she has been waiting for weeks for Olin Hauser to call her back. Patient states she has two more days of bystolic medication and cannot afford it. Patient states if she does not get a call soon she will continue to call and request to speak with Dr. Rockey Situ as "this should have been handled weeks ago"  Please advise

## 2020-05-19 NOTE — Telephone Encounter (Signed)
Spoke with patient and reviewed that discount card would not offer the cost of $15 due to some sort of issue with when the last script was filled. She states that she will call me back next week and will check with [employer] to see what information they provide. She was appreciative for the help and had no further questions for now.

## 2020-05-24 NOTE — Telephone Encounter (Signed)
Patient calling back in stating she has now found out exactly what needs to be done to get this resolved and she only has until tomorrow at 5 pm to have it be completed. Please advise quickly

## 2020-05-24 NOTE — Telephone Encounter (Signed)
Patient calling back to discuss brand penalty form.  Please call asap this needs to be submitted within 24hrs

## 2020-05-24 NOTE — Telephone Encounter (Signed)
Called patients pharmacy

## 2020-05-24 NOTE — Telephone Encounter (Addendum)
Spoke with both pharmacies regarding medication for this patient. After multiple calls it shows that [facility] [address] [facility] [place] [facility] then [facility] [address] which [facility] then filled on 04/06/20 for 90 day supply which would then allow her refill around the first part of January.   Updated patient on this status and pending call [facility] [address].

## 2020-05-24 NOTE — Telephone Encounter (Signed)
Called and spoke with patient and she states that prescription needs to be sent in as DAW. Advised that was sent in on 05/19/20 for that [place] code. Will call them to see what is needed. She wants me to call her back with results.

## 2020-05-24 NOTE — Telephone Encounter (Signed)
[facility]ed [facility] [address] and she states that patient is not eligible for refill until mid January. Reviewed situation with them that patient does not tolerate generic version and she tried to run prescription again but it would not be covered. [facility] pharmacist states that patient will need to call her insurance and explain the situation to them about the generic verses name brand so that they can provide her with override code and that should take care of it. Called patient back to make her aware that she should reach out to insurance regarding this matter and also Alison's recommendations. She verbalized understanding of conversation, will call insurance, and will update me hopefully by the end of today.

## 2020-05-25 ENCOUNTER — Telehealth: Payer: Self-pay | Admitting: Cardiovascular Disease

## 2020-05-25 NOTE — Telephone Encounter (Signed)
Spoke with patient and reviewed that form was completed and signed by provider and then faxed around lunch time. She was appreciative for the update with no further questions at this time.

## 2020-05-25 NOTE — Telephone Encounter (Signed)
Patient was calling to remind Nicole Gould to fill out her form for Bystolic .

## 2020-05-25 NOTE — Telephone Encounter (Signed)
Form completed and faxed to patients insurance company

## 2020-05-25 NOTE — Telephone Encounter (Signed)
Form has been done and faxed to her insurance company.

## 2020-05-25 NOTE — Telephone Encounter (Signed)
See other phone note. Reviewed with patient that application was completed and sent to her insurance

## 2020-05-26 MED ORDER — BYSTOLIC 10 MG PO TABS: 10.0000 mg | ORAL_TABLET | Freq: Every day | ORAL | 3 refills | Status: DC

## 2020-05-26 NOTE — Addendum Note (Signed)
Addended by: Anselm Pancoast on: 05/26/2020 03:57 PM   Modules accepted: Orders

## 2020-05-26 NOTE — Telephone Encounter (Signed)
Spoke with Nicole Gould regarding the Bystolic brand name has been approved from the time frame of 05/25/2020 to 05/26/2023.  The patient would like to have the Rx sent to [facility] in [place].

## 2020-05-27 ENCOUNTER — Other Ambulatory Visit: Payer: Self-pay | Admitting: Family Medicine

## 2020-05-27 DIAGNOSIS — Z1231 Encounter for screening mammogram for malignant neoplasm of breast: Secondary | ICD-10-CM

## 2020-05-27 NOTE — Telephone Encounter (Signed)
Spoke with patient who is very upset about the cost of brand name Bystolic. She states she requested for our office to complete a Brand penalty exception form so she could get the drug at a lower cost. I advised the patient that the form had been completed and was approved on 05/25/2020. A copy of the approval letter was also [facility]ed [facility] who states that despite the exception approval the drug will still cost $312.68 for a 90 day supply of brand name Bystolic. He did not have it in stock but has ordered it and states it will arrive today. He states that there is nothing he can do on his end and if the patient wants any further explanation of the cost that she would have to discuss it with her insurance company. Notified patient who states she will conact [facility] for further explanation of the cost. Patient expressed gratitude for the effort we made ion assisting her.

## 2020-05-27 NOTE — Telephone Encounter (Signed)
Patient attempted to get rx and was told they didn't know anything about the prior auth and the bystolic would cost greater than $300.   Patient states the brand form completed and submitted was supposed to approve her for the bystolic at generic cost.   Patient states she has "had it " and wants to speak with a manager to resolve this situation today.  Patient wants to also talk to sharon as she wants to know approval received .  Patient wants to discuss having this [facility] because they do not know what is going on and have not seen a form.

## 2020-05-28 NOTE — Telephone Encounter (Signed)
Left detailed voicemail message that I have not received form as of now but would go look again here shortly and that I would fill it out and we would get it faxed back as soon as we can, with instructions to please call if any further questions.

## 2020-05-28 NOTE — Telephone Encounter (Signed)
Patient states insurance will be sending a new form that is different to assist with this process.  Patient wanted to speak with pam to verify she received   Patient aware unless pam has at her desk the form was not received and she will call the insurance company with the correct fax and ask them to resend.   Pam - please call patient when this form is received.

## 2020-05-28 NOTE — Telephone Encounter (Signed)
Called and spoke with patient to see if there is anything that I can do to assist further with this medication. She said that we have done all that we could do on this end for now. She states that they received form, pharmacy is unable to assist due to previous refill of generic version. She did speak with [facility] and is pending outcome from that. She states there is nothing further for [place] to do and will continue dealing with her insurance to try and get this taken care of and will let us know the outcome. She was appreciative for the follow up call and has no further questions at this time.

## 2020-05-28 NOTE — Telephone Encounter (Signed)
Spoke with patient and she states that someone from [facility] fixed the issue and she now has 90 day supply for $15.00 and is so glad it was fixed for her. She was so appreciative for the help we provided her and was so glad this has been taken care of. Provided reassurance that we try to do what is needed to get medications approved and so grateful she reached someone to assist with this. She had no further questions and wished everyone a Merry Christmas.

## 2020-06-01 ENCOUNTER — Telehealth: Payer: Self-pay | Admitting: Cardiovascular Disease

## 2020-06-01 NOTE — Telephone Encounter (Signed)
Please call to discuss Amlodipine Pt c/o medication issue:  1. Name of Medication: Amlodipine  2. How are you currently taking this medication (dosage and times per day)? 2.5 mg twice a day  3. Are you having a reaction (difficulty breathing--STAT)? By mistake patient took 2 instead of 3, tightness in chest.  4. What is your medication issue?  Pt c/o of Chest Pain: STAT if CP now or developed within 24 hours  1. Are you having CP right now?  No, after patient took too many Amlodipine  2. Are you experiencing any other symptoms (ex. SOB, nausea, vomiting, sweating)? Tightness in chest, weak BP was low, diastolic was 60  3. How long have you been experiencing CP? Just since she took   4. Is your CP continuous or coming and going? Comes and goes  5. Have you taken Nitroglycerin? no ?

## 2020-06-02 NOTE — Telephone Encounter (Signed)
Reach out to pt by phone, she called yesterday she was concern that she took another one of her Amlodipine that she usually takes BID, but she had forgotten she took her 2nd dose so she ended taking it TID yesterday. Pt reported that after taking the medication she had intermittent CP, BP was 137/60, no shob, no tingling, numbness, and no headache. Pt reports today she is feeling much better reports "I feel like my ole self", no CP today, did not have to take any Nitros or other medication to relive CP. Pt had made an appt with Caitlin for 12/21 r/t the extra dose of medication, but wants to cancel d/t feeling much better, so that was cancel and her 12 months f/u was schedule for Jul 26, 2020. Otherwise all questions or concerns were address and no additional concerns at this time. Agreeable to plan, will call back for anything further.

## 2020-06-08 ENCOUNTER — Ambulatory Visit: Payer: No Typology Code available for payment source | Admitting: Family

## 2020-06-23 ENCOUNTER — Other Ambulatory Visit: Payer: Self-pay | Admitting: Physician Assistant

## 2020-06-29 ENCOUNTER — Telehealth: Payer: Self-pay | Admitting: Cardiovascular Disease
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
## 2020-06-30 ENCOUNTER — Ambulatory Visit (INDEPENDENT_AMBULATORY_CARE_PROVIDER_SITE_OTHER): Payer: No Typology Code available for payment source | Admitting: Cardiovascular Disease

## 2020-06-30 ENCOUNTER — Other Ambulatory Visit: Payer: Self-pay

## 2020-06-30 ENCOUNTER — Encounter: Payer: Self-pay | Admitting: Cardiovascular Disease

## 2020-06-30 VITALS — BP 128/82 | HR 62 | Ht 64.0 in | Wt 189.0 lb

## 2020-06-30 DIAGNOSIS — E782 Mixed hyperlipidemia: Secondary | ICD-10-CM

## 2020-06-30 DIAGNOSIS — I10 Essential (primary) hypertension: Secondary | ICD-10-CM

## 2020-06-30 DIAGNOSIS — I872 Venous insufficiency (chronic) (peripheral): Secondary | ICD-10-CM | POA: Diagnosis not present

## 2020-06-30 DIAGNOSIS — R079 Chest pain, unspecified: Secondary | ICD-10-CM | POA: Diagnosis not present

## 2020-06-30 NOTE — Patient Instructions (Addendum)
Labs through PMD: We try to keep potassium 3.5 to 5 (low potassium can cause extra bets, chest tightness, muscle cramps)  If creatinine elevated (>1.2) , be cautious with extra HCTZ If BUN >25, be cautious about extra HCTZ  See if they will check Vit D and B12   Medication Instructions:  No changes  Consider taking extra HCTZ after lunch as needed for high pressure in the evening  If you need a refill on your cardiac medications before your next appointment, please call your pharmacy.    Lab work: No new labs needed   If you have labs (blood work) drawn today and your tests are completely normal, you will receive your results only by: Marland Kitchen MyChart Message (if you have MyChart) OR . A paper copy in the mail If you have any lab test that is abnormal or we need to change your treatment, we will call you to review the results.   Testing/Procedures: No new testing needed   Follow-Up: At [facility], you and your health needs are our priority.  As part of our continuing mission to provide you with exceptional heart care, we have created designated Provider Care Teams.  These Care Teams include your primary Cardiologist (physician) and Advanced Practice Providers (APPs -  Physician Assistants and Nurse Practitioners) who all work together to provide you with the care you need, when you need it.  . You will need a follow up appointment in 12 months  . Providers on your designated Care Team:   . Murray Hodgkins, NP . Christell Faith, PA-C . Marrianne Mood, PA-C  Any Other Special Instructions Will Be Listed Below (If Applicable).  COVID-19 Vaccine Information can be found at: [email or web address] For questions related to vaccine distribution or appointments, please email [email or web address] or call [phone].
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
## 2020-07-01 ENCOUNTER — Ambulatory Visit: Payer: No Typology Code available for payment source | Admitting: Physician Assistant

## 2020-07-06 ENCOUNTER — Other Ambulatory Visit: Payer: Self-pay | Admitting: Internal Medicine

## 2020-07-08 ENCOUNTER — Ambulatory Visit
Admission: RE | Admit: 2020-07-08 | Discharge: 2020-07-08 | Disposition: A | Discharge status: Home / Self Care | Payer: No Typology Code available for payment source | Source: Ambulatory Visit | Attending: Family Medicine | Admitting: Family Medicine

## 2020-07-08 ENCOUNTER — Other Ambulatory Visit: Payer: Self-pay

## 2020-07-08 DIAGNOSIS — Z1231 Encounter for screening mammogram for malignant neoplasm of breast: Secondary | ICD-10-CM

## 2020-07-16 ENCOUNTER — Other Ambulatory Visit: Payer: Self-pay | Admitting: Cardiovascular Disease

## 2020-07-26 ENCOUNTER — Ambulatory Visit: Payer: No Typology Code available for payment source | Admitting: Cardiovascular Disease

## 2020-08-03 ENCOUNTER — Other Ambulatory Visit: Payer: Self-pay | Admitting: Physical Medicine and Rehabilitation

## 2020-08-03 DIAGNOSIS — M79601 Pain in right arm: Secondary | ICD-10-CM

## 2020-08-03 DIAGNOSIS — M542 Cervicalgia: Secondary | ICD-10-CM

## 2020-08-26 ENCOUNTER — Ambulatory Visit
Admission: RE | Admit: 2020-08-26 | Discharge: 2020-08-26 | Disposition: A | Discharge status: Home / Self Care | Payer: No Typology Code available for payment source | Source: Ambulatory Visit | Attending: Physical Medicine and Rehabilitation | Admitting: Physical Medicine and Rehabilitation

## 2020-08-26 ENCOUNTER — Other Ambulatory Visit: Payer: Self-pay

## 2020-08-26 DIAGNOSIS — M79601 Pain in right arm: Secondary | ICD-10-CM

## 2020-08-26 DIAGNOSIS — M542 Cervicalgia: Secondary | ICD-10-CM

## 2020-10-06 ENCOUNTER — Other Ambulatory Visit: Payer: Self-pay | Admitting: Cardiovascular Disease

## 2020-10-06 ENCOUNTER — Other Ambulatory Visit: Payer: Self-pay | Admitting: Physician Assistant

## 2020-10-06 NOTE — Telephone Encounter (Signed)
Ok to refill as requested 

## 2020-10-06 NOTE — Telephone Encounter (Signed)
Pt requesting Amlodipine 5 mg tablet Take 1 tablet qd Rx. Pt is taking (2) 2.5 mg tablets daily to equal the 5 mg total daily. Pt mentioned that it has worked better for her this way and that Dr.Gollan was aware if this working for her. Pt is requesting 90 day with Refills. Pt needs new Rx.  Please advise.

## 2020-10-08 ENCOUNTER — Other Ambulatory Visit: Payer: Self-pay | Admitting: Physician Assistant

## 2020-10-13 ENCOUNTER — Other Ambulatory Visit: Payer: Self-pay | Admitting: Internal Medicine

## 2020-11-08 ENCOUNTER — Other Ambulatory Visit: Payer: Self-pay | Admitting: Family Medicine

## 2020-11-08 DIAGNOSIS — M25511 Pain in right shoulder: Secondary | ICD-10-CM

## 2020-11-09 ENCOUNTER — Other Ambulatory Visit: Payer: Self-pay | Admitting: Orthopaedic Surgery

## 2020-11-09 DIAGNOSIS — M25511 Pain in right shoulder: Secondary | ICD-10-CM

## 2020-11-20 ENCOUNTER — Ambulatory Visit
Admission: RE | Admit: 2020-11-20 | Discharge: 2020-11-20 | Disposition: A | Discharge status: Home / Self Care | Payer: No Typology Code available for payment source | Source: Ambulatory Visit | Attending: Orthopaedic Surgery | Admitting: Orthopaedic Surgery

## 2020-11-20 DIAGNOSIS — M25511 Pain in right shoulder: Secondary | ICD-10-CM

## 2020-12-28 ENCOUNTER — Telehealth: Payer: Self-pay | Admitting: Internal Medicine

## 2020-12-29 NOTE — Telephone Encounter (Signed)
Left message advising patient that the "orange box" of Align is he original formulation. There are 2 "green boxes"; one for immune support and the other showing 5 extra strength. I advised that the original formulation Align should be sufficient but neither probiotic would hurt her if she chose the other formulation.

## 2021-01-05 ENCOUNTER — Telehealth: Payer: Self-pay | Admitting: Cardiovascular Disease

## 2021-01-05 NOTE — Telephone Encounter (Signed)
Return call to Nicole Gould as she is concern her BP is dropping causing her symptoms of dizziness, light-headiness, and feelings of unstableness on her feel.  Pt reports that she walks daily and exercise, reports at times "I think my pressure drops when I am out walking, I get dizzy and lightheadedness, to the point I half to grab something to hold on to, to keep me from falling". Stated when she checks her BP after walking it ranges 120/68-120/76. Advised pt that typically is a BP WNL, pt reports that is "low for her" and that it is causing her symptoms.  Pt also reports she stop talking her HTCZ d/t cramping and numbness in her hands and thinks that "was dipping my BP, since I been off it I have felt better overall". Talked to her pharmacy and they told her "may have been pushing off too much fluids off setting my electrolytes, so they told me to try body armour".  Advised to watch sugar content on fruit drinks, suggested "no sugar added" or "0 sugar". Pt reports has been mindful as she has to watch her sugars, went on to say her BS is stable and that is not the source to her " drop in BP".   Pt reports after her walks is when she takes her BP meds then her BP "gets even lower and I feel the worse". Advised pt to wait later in the day to take her BP after checking her BP and when she thinks her BP is stable to take, Pt verbalized understanding.  Reports BP while on the phone is 152/85, advised could be elevated as she is "hyped up on the phone" during our conversation, advised when conversation is done, rest for 15 mins drink water and recheck BP.   Pt went on about non-cardiac medical issues, had to redirect pt back to focus to her BP and dizziness. Did advised to see her GI regarding GERD medication and OTC pepcid for flares.  Otherwise all questions or concerns were address and no additional concerns at this time. Agreeable to plan, will call back for anything further, pt has appt 8/26.

## 2021-01-05 NOTE — Telephone Encounter (Signed)
Patient is calling back to say her blood pressure after walking this morning is 120/76 Heartrate is 84

## 2021-01-05 NOTE — Telephone Encounter (Signed)
Patient states she is concerned about her blood pressure dropping. States she has been walking a lot and has noticed her blood pressure dropping. She states she has stopped her Hydrochlorothiazide. She states her symptoms were hand cramps, dizziness and woozy, and not feeling stable on her feet. States her blood pressure when she felt dizzy was 120/68. She feels as if her blood pressure medication needs to be adjusted. Please call to discuss.  Patient has an appointment on 8/26 with Christell Faith, PA

## 2021-01-06 ENCOUNTER — Telehealth: Payer: Self-pay | Admitting: Internal Medicine

## 2021-01-06 ENCOUNTER — Other Ambulatory Visit: Payer: Self-pay | Admitting: Internal Medicine

## 2021-01-06 NOTE — Telephone Encounter (Signed)
Pyrtle pt calling, states she is having issues with reflux/heartburn issues. She reports she is taking protonix bid along with mylanta and she tool pepcid last night also. Reports it has been worse for about a week. She remembered she had been given carafate tabs and that they had helped but what she has is expired. Pt requesting new script to see if she can get things calmed down. She has an OV scheduled with Dr.  Hilarie Fredrickson 02/01/21 but wanted to try to get things controlled a little prior to the appt. As DOD please advise.

## 2021-01-06 NOTE — Telephone Encounter (Signed)
Inbound call from patient requesting a call back to discuss different medications to help calm her GERD. States she have been taking pantoprazole twice a day, Mylanta, and Pepcid but seem nothing helps completely. Says one weekend she was prescribed Sucralfate by on call doctor and that really helped. Best contact number [phone] after 1:30

## 2021-01-07 MED ORDER — SUCRALFATE 1 G PO TABS: 1.0000 g | ORAL_TABLET | Freq: Three times a day (TID) | ORAL | 0 refills | Status: DC

## 2021-01-07 NOTE — Telephone Encounter (Signed)
carafate 1 g tab tid ac and hs # 120 no refill

## 2021-01-07 NOTE — Telephone Encounter (Signed)
Detailed message left on pts identified voicemail. Script sent to pharmacy.

## 2021-02-01 ENCOUNTER — Encounter: Payer: Self-pay | Admitting: Internal Medicine

## 2021-02-01 ENCOUNTER — Ambulatory Visit (INDEPENDENT_AMBULATORY_CARE_PROVIDER_SITE_OTHER): Payer: No Typology Code available for payment source | Admitting: Internal Medicine

## 2021-02-01 VITALS — BP 150/80 | HR 68 | Ht 64.25 in | Wt 190.4 lb

## 2021-02-01 DIAGNOSIS — K219 Gastro-esophageal reflux disease without esophagitis: Secondary | ICD-10-CM

## 2021-02-01 DIAGNOSIS — M940 Chondrocostal junction syndrome [Tietze]: Secondary | ICD-10-CM | POA: Diagnosis not present

## 2021-02-01 DIAGNOSIS — R0789 Other chest pain: Secondary | ICD-10-CM | POA: Diagnosis not present

## 2021-02-01 MED ORDER — METHYLPREDNISOLONE 4 MG PO TBPK: ORAL_TABLET | ORAL | 0 refills | Status: DC

## 2021-02-01 NOTE — Patient Instructions (Addendum)
We have sent the following medications to your pharmacy for you to pick up at your convenience:Medrol Dosepak.  Continue pantoprazole 40 mg twice dialy.   Stay on your probiotic.   You can take over the counter Gaviscon liquid for break through heartburn symptoms.   Thank you for entrusting me with your care and for choosing [facility], Dr. Zenovia Jarred

## 2021-02-01 NOTE — Progress Notes (Signed)
Subjective:    Patient ID: Nicole Gould, female    DOB: Jun 20, 1960, 60 y.o.   MRN: [id number]  HPI Nicole Gould is a 60 year old female with a history of GERD, adenomatous and sessile serrated colon polyp who is here for follow-up.  She was last seen in the office in March 2021 and for upper endoscopy on 10/15/2019.  She is here alone today.  EGD on 10/15/2019 revealed a normal esophagus.  This was biopsied and did not show any evidence of reflux or eosinophilic esophagitis.  Benign fundic gland polyps were seen in the stomach.  Biopsies from the stomach were negative for H. pylori.  There was chronic inactive gastritis.  There was mild duodenal bulb erythema without ulceration or erosion.  She reports she has been doing very well with her reflux regimen of pantoprazole 40 mg twice daily and occasional sucralfate.  However over the last 2 to 3 weeks she has noticed a burning pain in her right upper chest just right of the sternum which seems to radiate towards her right shoulder.  This seems to be worse with eating but also movement.  She has continued pantoprazole 40 mg twice daily and has not noticed much in the way of heartburn.  She did try Mylanta which seemed to help some with her chest discomfort.  She is not having dysphagia or odynophagia.  Carafate also in slurry form was not helpful.  She is continued probiotic and is not having much intestinal gas or belching.  Her bowel movements have been regular without blood or melena.  She has a bright spot and that her 62 year old daughter is now [redacted] weeks pregnant.  Her daughter had had issues getting pregnant due to polycystic ovary syndrome.  She will be going to the [place] area this weekend to help celebrate her daughter's pregnancy with her family.  Review of Systems As per HPI, otherwise negative  Current Medications, Allergies, Past Medical History, Past Surgical History, Family History and Social History were reviewed in [facility] record.    Objective:   Physical Exam BP (!) 150/80 (BP Location: Left Arm, Patient Position: Sitting, Cuff Size: Normal)   Pulse 68   Ht 5' 4.25" (1.632 m) Comment: height measured without shoes  Wt 190 lb 6 oz (86.4 kg)   LMP 12/01/2013 (Approximate)   BMI 32.42 kg/m  Gen: awake, alert, NAD HEENT: anicteric,  CV: RRR, no mrg Chest: Point tenderness over the right upper rib cage at the junction of the anterior ribs and sternum, roughly third rib, no tenderness with palpation of the right rib cage or extreme lateral left rib cage anteriorly Pulm: CTA b/l Abd: soft, NT/ND, +BS throughout Ext: no c/c/e Neuro: nonfocal     Assessment & Plan:  60 year old female with a history of GERD, adenomatous and sessile serrated colon polyp who is here for follow-up.    Chest wall pain/costochondritis --pain with palpation of the rib cage consistent with costochondritis and not GERD.  We discussed this today which is why this pain seems to be worse at times with movement and also not improving with her pantoprazole. --Medrol Dosepak  2.  GERD --no evidence for esophagitis or Barrett's esophagus at upper endoscopy last year.  There was no eosinophilic changes by biopsy.  It seems that her heartburn is mostly controlled with current regimen and so we will continue as follows: --Pantoprazole 40 mg twice daily AC --Can use periodic sucralfate slurry 1 g nightly as needed --Gaviscon  liquid can be used per bottle instruction for breakthrough heartburn and indigestion  3.  Gas pain and bloating --resolved with probiotic, continue Align daily  4.  History of colon polyps --surveillance colonoscopy due in 2023  30 minutes total spent today including patient facing time, coordination of care, reviewing medical history/procedures/pertinent radiology studies, and documentation of the encounter.

## 2021-02-09 NOTE — Progress Notes (Signed)
Cardiology Office Note    Date:  02/11/2021   ID:  Nicole Gould, DOB 26-May-1961, MRN ID:[id number]  PCP:  [name], DO  Cardiologist:  Ida Rogue, MD  Electrophysiologist:  None   Chief Complaint: Follow-up  History of Present Illness:   Nicole Gould is a 60 y.o. female with history of difficult to control hypertension with multiple medication intolerances, chronic dizziness unrelated to blood pressure, headache disorder, palpitations, anxiety, and GERD who presents for follow-up of hypertension.   Prior ambulatory blood pressure monitoring in 03/2018 showed an average blood pressure of 146/75 with an average heart rate of 61 bpm.  Average blood pressure during waking hours was 150/78.  Previously followed by our pharmacy team for hypertension.  Calcium score of 0 in 06/2019.  She was last seen in the office in 06/2020 noting higher blood pressure readings throughout the holidays.  She also noted intermittent chest discomfort that would come on at rest and was not associated with exertion.  She was taking Bystolic 10 mg in the morning, HCTZ 12.5 mg, amlodipine 2.5 mg twice daily subsequently changes to 5 mg in the a.m.  She noted her BP was running high in the evenings and was at times taking 2.5 mg of amlodipine in the evening.  BP was well controlled in the office at 128/82.  It was recommended she take amlodipine 5 mg in the morning with Bystolic 10 mg and HCTZ [id number] mg.  As needed p.m. a dose of HCTZ was also recommended.  There was some concern for dietary indiscretion and high salt intake.  With regards to her chest discomfort, this was atypical in nature and no further work-up was indicated.  She contacted our office in 12/2018 noting dizziness with blood pressure documented at 120/68.  She felt like her blood pressure was too low while walking.  She had discontinued hydrochlorothiazide secondary to hand cramps and dizziness.  She has subsequently been evaluated by GI for  possible GERD symptoms, though did not note improvement with PPI.  She was empirically treated for costochondritis with prednisone.   She comes in today noting two issues.  1) Since retiring her stress level has improved significantly.  With this she has noted improvement in her blood pressure.  She notes while taking her blood pressure medications in the morning, followed by her regular morning walk she would note dizziness with associated hypotension afterwards, at times BP in the low [id number] systolic.  She felt like she was overmedicated.  In this setting she discontinued HCTZ and has subsequently noted symptoms have resolved.  No further dizziness or associated relative hypotension thereafter.  She continues to live an active lifestyle without cardiac limitation.  She is pleased with her BP control at this time.   2) She reports having received a massage in [place] in 10/2020.  During this massage the masseuse stood up on the patient's back and began walking superiorly toward her thoracic spine.  In this setting the patient heard a "crack."  This was followed by sharp pain along her anterior chest which radiated through to her back.  Since being treated with prednisone the sharp pain that radiated through to her back has resolved.  She does continue to note chest discomfort along the sternum that is worse with positional movement, deep inspiration and reproducible to palpation.    Documented hypertension medication intolerances: Cardura - dizziness Amlodipine - lower extremity swelling; headache HCTZ - hypokalemia and dry mouth, with capsule form increased UOP  output/cramps Losartan - dizziness and shortness of breath; malaise Lisinopril - cough Benicar - lightheadedness and presyncope Spironolactone - breast fullness Eplerenone - breast tenderness   Labs independently reviewed: 06/2020 - Hgb 13.2, PLT 162, BUN 10, serum creatinine 0.8, potassium 4.4, albumin 4.0, AST/ALT normal, A1c 5.8,  TSH normal, TC 203, TG 79, HDL 58, LDL 131   Past Medical History:  Diagnosis Date   Allergy    Anxiety    Chicken pox    Chronic Dizziness    Colon polyps    Concussion    Frequent headaches    GERD (gastroesophageal reflux disease)    History of stress test    a. 2005 - in setting of ectopy - reportedly nl.   Hypertension    a. 03/2018 24hr ABM: Mean BP 146/75, mean HR 61; Mean awake BP/HR 150/78, 62; Mean sleep BP/HR 130/65, 58.   Migraines    Pre-eclampsia    with grand mal seizures   Seizure disorder in pregnancy ([place])    Seizures ([place])    with pregnancy 1981- pre clampsia     Past Surgical History:  Procedure Laterality Date   COLONOSCOPY     DILATION AND CURETTAGE OF UTERUS  09/08/2014   with hystereoscopy   POLYPECTOMY     TONSILLECTOMY     TONSILLECTOMY     TUBAL LIGATION      Current Medications: Current Meds  Medication Sig   amLODipine (NORVASC) 5 MG tablet Take 1 tablet (5 mg total) by mouth daily.   azelastine (OPTIVAR) 0.05 % ophthalmic solution Place 1 drop into both eyes 2 (two) times daily as needed (itchy/watery eyes).   Azelastine-Fluticasone 137-50 MCG/ACT SUSP Place 1 spray into the nose in the morning and at bedtime.   BIOTIN PO Take by mouth.   BYSTOLIC 10 MG tablet Take 1 tablet (10 mg total) by mouth daily.   Cholecalciferol (VITAMIN D-3) 1000 UNITS CAPS Take 1 capsule by mouth daily.   CINNAMON PO Take 1,000 mg by mouth daily.    meloxicam (MOBIC) 7.5 MG tablet Take 7.5 mg by mouth as needed.   Misc Natural Products (APPLE CIDER VINEGAR DIET PO) [place] Gummies 2 gummies BID.   Multiple Vitamins-Minerals (MULTIVITAMIN WITH MINERALS) tablet Take 1 tablet by mouth daily.   pantoprazole (PROTONIX) 40 MG tablet TAKE 1 TABLET BY MOUTH TWICE A DAY BEFORE A MEAL   Probiotic Product (PROBIOTIC PO) Take 1 tablet by mouth daily. Align   zinc gluconate 50 MG tablet Take 50 mg by mouth daily.    Allergies:   Losartan; Anesthetics, amide; Benadryl  [diphenhydramine hcl (sleep)]; Benicar [olmesartan]; Cardura [doxazosin mesylate]; Codeine; Fentanyl; Losartan potassium-hctz; Metformin and related; and Prilocaine   Social History   Socioeconomic History   Marital status: Married    Spouse name: Not on file   Number of children: Not on file   Years of education: Not on file   Highest education level: Not on file  Occupational History   Not on file  Tobacco Use   Smoking status: Never   Smokeless tobacco: Never  Vaping Use   Vaping Use: Never used  Substance and Sexual Activity   Alcohol use: Yes    Comment: very rare - glass red wine when that    Drug use: No   Sexual activity: Yes    Partners: Male  Other Topics Concern   Not on file  Social History Narrative   Not on file   Social Determinants of  Health   Financial Resource Strain: Not on file  Food Insecurity: Not on file  Transportation Needs: Not on file  Physical Activity: Not on file  Stress: Not on file  Social Connections: Not on file     Family History:  The patient's family history includes Cancer in her maternal grandfather; Colon cancer in her maternal grandfather; Diabetes in her father and mother; Heart disease in her father; Hypertension in her father and mother; Kidney disease in her father. There is no history of Colon polyps, Breast cancer, Esophageal cancer, Rectal cancer, or Stomach cancer.  ROS:   Review of Systems  Constitutional:  Negative for chills, diaphoresis, fever, malaise/fatigue and weight loss.  HENT:  Negative for congestion.   Eyes:  Negative for discharge and redness.  Respiratory:  Negative for cough, sputum production, shortness of breath and wheezing.   Cardiovascular:  Positive for chest pain. Negative for palpitations, orthopnea, claudication, leg swelling and PND.       Chest pain following traumatic massage  Gastrointestinal:  Negative for abdominal pain, heartburn, nausea and vomiting.  Musculoskeletal:  Negative for  falls and myalgias.  Skin:  Negative for rash.  Neurological:  Negative for dizziness, tingling, tremors, sensory change, speech change, focal weakness, loss of consciousness and weakness.  Endo/Heme/Allergies:  Does not bruise/bleed easily.  Psychiatric/Behavioral:  Negative for substance abuse. The patient is not nervous/anxious.     EKGs/Labs/Other Studies Reviewed:    Studies reviewed were summarized above. The additional studies were reviewed today: As above.   EKG:  EKG is ordered today.  The EKG ordered today demonstrates sinus bradycardia, 56 bpm, normal axis, no acute ST-T changes  Recent Labs: No results found for requested labs within last 8760 hours.  Recent Lipid Panel    Component Value Date/Time   CHOL 184 06/24/2019 0906   TRIG 50 06/24/2019 0906   HDL 60 06/24/2019 0906   CHOLHDL 3.1 06/24/2019 0906   CHOLHDL 3 08/27/2015 0806   VLDL 11.0 08/27/2015 0806   LDLCALC 114 (H) 06/24/2019 0906   LDLDIRECT 113.0 09/16/2014 1208    PHYSICAL EXAM:    VS:  BP 130/80 (BP Location: Left Arm, Patient Position: Sitting, Cuff Size: Normal)   Pulse (!) 56   Ht 5' 4.25" (1.632 m)   Wt 191 lb (86.6 kg)   LMP 12/01/2013 (Approximate)   SpO2 97%   BMI 32.53 kg/m   BMI: Body mass index is 32.53 kg/m.  Physical Exam Vitals reviewed.  Constitutional:      Appearance: She is well-developed.  HENT:     Head: Normocephalic and atraumatic.  Eyes:     General:        Right eye: No discharge.        Left eye: No discharge.  Neck:     Vascular: No JVD.  Cardiovascular:     Rate and Rhythm: Normal rate and regular rhythm.     Pulses:          Posterior tibial pulses are 2+ on the right side and 2+ on the left side.     Heart sounds: Normal heart sounds, S1 normal and S2 normal. Heart sounds not distant. No midsystolic click and no opening snap. No murmur heard.   No friction rub.     Comments: Sternal chest pain is fully reproducible to palpation on exam today and  exacerbated by deep inhalation during pulmonary auscultation Pulmonary:     Effort: Pulmonary effort is normal. No respiratory distress.  Breath sounds: Normal breath sounds. No decreased breath sounds, wheezing or rales.  Chest:     Chest wall: No tenderness.  Abdominal:     General: There is no distension.     Palpations: Abdomen is soft.     Tenderness: There is no abdominal tenderness.  Musculoskeletal:     Cervical back: Normal range of motion.     Right lower leg: Edema present.     Left lower leg: Edema present.     Comments: Trivial bilateral ankle edema  Skin:    General: Skin is warm and dry.     Nails: There is no clubbing.  Neurological:     Mental Status: She is alert and oriented to person, place, and time.  Psychiatric:        Speech: Speech normal.        Behavior: Behavior normal.        Thought Content: Thought content normal.        Judgment: Judgment normal.    Wt Readings from Last 3 Encounters:  02/11/21 191 lb (86.6 kg)  02/01/21 190 lb 6 oz (86.4 kg)  06/30/20 189 lb (85.7 kg)     ASSESSMENT & PLAN:   Musculoskeletal chest pain: Symptoms began following a traumatic massage in 10/2020.  Schedule chest x-ray with bilateral ribs.  If plain films are unrevealing, and symptoms persist, may need further imaging.  Symptoms do not appear to be ischemic in etiology.  There is no exertional component.  HTN: Blood pressure is well controlled in the office today.  She was developing symptomatic hypotension with continued use of HCTZ, particularly if taking early in the morning.  Since discontinuing this medication blood pressure has stabilized.  She remains on amlodipine 5 mg and Bystolic 10 mg daily.  Symptoms of relative hypotension associated with walking appear to correlate with overmedication.  Dizziness: Chronic issue and improved with discontinuation of HCTZ.  Disposition: F/u with Dr. Rockey Situ or an APP in 3 months.   Medication Adjustments/Labs and  Tests Ordered: Current medicines are reviewed at length with the patient today.  Concerns regarding medicines are outlined above. Medication changes, Labs and Tests ordered today are summarized above and listed in the Patient Instructions accessible in Encounters.   Signed, Christell Faith, PA-C 02/11/2021 1:06 PM     [address] [place], [place] [address] [phone]

## 2021-02-11 ENCOUNTER — Ambulatory Visit
Admission: RE | Admit: 2021-02-11 | Discharge: 2021-02-11 | Disposition: A | Discharge status: Home / Self Care | Payer: No Typology Code available for payment source | Source: Ambulatory Visit | Attending: Physician Assistant | Admitting: Physician Assistant

## 2021-02-11 ENCOUNTER — Other Ambulatory Visit: Payer: Self-pay | Admitting: Physician Assistant

## 2021-02-11 ENCOUNTER — Ambulatory Visit: Payer: No Typology Code available for payment source | Admitting: Physician Assistant

## 2021-02-11 ENCOUNTER — Ambulatory Visit (INDEPENDENT_AMBULATORY_CARE_PROVIDER_SITE_OTHER): Payer: No Typology Code available for payment source | Admitting: Physician Assistant

## 2021-02-11 ENCOUNTER — Other Ambulatory Visit: Payer: Self-pay

## 2021-02-11 ENCOUNTER — Encounter: Payer: Self-pay | Admitting: Physician Assistant

## 2021-02-11 VITALS — BP 130/80 | HR 56 | Ht 64.25 in | Wt 191.0 lb

## 2021-02-11 DIAGNOSIS — R079 Chest pain, unspecified: Secondary | ICD-10-CM

## 2021-02-11 DIAGNOSIS — R42 Dizziness and giddiness: Secondary | ICD-10-CM

## 2021-02-11 DIAGNOSIS — R52 Pain, unspecified: Secondary | ICD-10-CM

## 2021-02-11 DIAGNOSIS — R0781 Pleurodynia: Secondary | ICD-10-CM | POA: Insufficient documentation

## 2021-02-11 DIAGNOSIS — I10 Essential (primary) hypertension: Secondary | ICD-10-CM

## 2021-02-11 DIAGNOSIS — R0789 Other chest pain: Secondary | ICD-10-CM

## 2021-02-11 NOTE — Patient Instructions (Signed)
Medication Instructions:  Your physician recommends that you continue on your current medications as directed. Please refer to the Current Medication list given to you today.  *If you need a refill on your cardiac medications before your next appointment, please call your pharmacy*   Lab Work: None ordered  If you have labs (blood work) drawn today and your tests are completely normal, you will receive your results only by: [place] (if you have MyChart) OR A paper copy in the mail If you have any lab test that is abnormal or we need to change your treatment, we will call you to review the results.   Testing/Procedures: A chest and rib x-ray takes a picture of the organs and structures inside the chest, including the heart, lungs, and blood vessels. This test can show several things, including, whether the heart is enlarges; whether fluid is building up in the lungs; and whether pacemaker / defibrillator leads are still in place.  (To be done today. Please report to the [facility] medical mall registration desk to check in)   Follow-Up: At [facility], you and your health needs are our priority.  As part of our continuing mission to provide you with exceptional heart care, we have created designated Provider Care Teams.  These Care Teams include your primary Cardiologist (physician) and Advanced Practice Providers (APPs -  Physician Assistants and Nurse Practitioners) who all work together to provide you with the care you need, when you need it.  We recommend signing up for the patient portal called "MyChart".  Sign up information is provided on this After Visit Summary.  MyChart is used to connect with patients for Virtual Visits (Telemedicine).  Patients are able to view lab/test results, encounter notes, upcoming appointments, etc.  Non-urgent messages can be sent to your provider as well.   To learn more about what you can do with MyChart, go to [email or web address].    Your  next appointment:   3 month(s)  The format for your next appointment:   In Person  Provider:   You may see Ida Rogue, MD or one of the following Advanced Practice Providers on your designated Care Team:   Murray Hodgkins, NP Christell Faith, PA-C Marrianne Mood, PA-C Cadence Kathlen Mody, [place]   Other Instructions N/A

## 2021-04-05 ENCOUNTER — Telehealth: Payer: Self-pay | Admitting: Cardiovascular Disease

## 2021-04-05 NOTE — Telephone Encounter (Signed)
Spoke with patient and she reports 3 different things going on.   She states that she was diagnosed with Costochondritis and that she is having chest discomfort. We discussed how the costochondritis can cause these symptoms of discomfort she is having.   Then she mentioned long term GERD symptoms and is receiving treatment.  She feels that this could be causing some of her discomfort. Advised that it was good she is being treated and she confirmed this was with GI. She is on medications which have been helpful.   She then confirmed her upcoming appointment here with Dr. Rockey Situ and did not want to wait that long given her symptoms. Scheduled her to come in sooner given her discomfort. Reviewed signs and symptoms that would require evaluation in the emergency department. Discussed that if symptoms should persist or worsen to seek care at the ED and confirmed sooner appointment that was scheduled. She verbalized understanding of our conversation, agreement with plan, and had no further questions at this time.

## 2021-04-05 NOTE — Telephone Encounter (Signed)
Patient states that over the last 2 weeks she has been feeling a lot of pressure in her chest. She thinks it is the Bystolic.  Pt c/o of Chest Pain: STAT if CP now or developed within 24 hours  1. Are you having CP right now? A little, not much, yesterday all day  2. Are you experiencing any other symptoms (ex. SOB, nausea, vomiting, sweating)? Pressure/pain since starting taking Bystolic.  No other symptoms. Patient also states she has costochondritis.  3. How long have you been experiencing CP?  Since started taking bystolic  4. Is your CP continuous or coming and going? Comes and goes  5. Have you taken Nitroglycerin?  ?

## 2021-04-08 ENCOUNTER — Other Ambulatory Visit: Payer: Self-pay | Admitting: Physical Medicine & Rehabilitation

## 2021-04-08 DIAGNOSIS — M25511 Pain in right shoulder: Secondary | ICD-10-CM

## 2021-04-12 ENCOUNTER — Other Ambulatory Visit: Payer: Self-pay

## 2021-04-12 ENCOUNTER — Encounter: Payer: Self-pay | Admitting: Cardiovascular Disease

## 2021-04-12 ENCOUNTER — Ambulatory Visit (INDEPENDENT_AMBULATORY_CARE_PROVIDER_SITE_OTHER): Payer: No Typology Code available for payment source | Admitting: Cardiovascular Disease

## 2021-04-12 VITALS — BP 100/70 | HR 57 | Ht 64.0 in | Wt 187.0 lb

## 2021-04-12 DIAGNOSIS — R0602 Shortness of breath: Secondary | ICD-10-CM

## 2021-04-12 DIAGNOSIS — I10 Essential (primary) hypertension: Secondary | ICD-10-CM | POA: Diagnosis not present

## 2021-04-12 DIAGNOSIS — R079 Chest pain, unspecified: Secondary | ICD-10-CM | POA: Diagnosis not present

## 2021-04-12 DIAGNOSIS — R600 Localized edema: Secondary | ICD-10-CM

## 2021-04-12 DIAGNOSIS — E782 Mixed hyperlipidemia: Secondary | ICD-10-CM | POA: Diagnosis not present

## 2021-04-12 MED ORDER — AMLODIPINE BESYLATE 5 MG PO TABS: 5.0000 mg | ORAL_TABLET | Freq: Every day | ORAL | 3 refills | Status: DC

## 2021-04-12 MED ORDER — HYDROCHLOROTHIAZIDE 12.5 MG PO TABS: 12.5000 mg | ORAL_TABLET | Freq: Every day | ORAL | 3 refills | Status: DC

## 2021-04-12 MED ORDER — BYSTOLIC 10 MG PO TABS: 10.0000 mg | ORAL_TABLET | Freq: Every day | ORAL | 3 refills | Status: DC

## 2021-04-12 NOTE — Patient Instructions (Addendum)
Ice and meloxicam for pain Medication Instructions:  Consider moving the amlodipine to the PM If still dizzy after one week, Consider cutting the bystolic in 1/2 in the AM Other 1/2 bystolic later   If you need a refill on your cardiac medications before your next appointment, please call your pharmacy.   Lab work: No new labs needed  Testing/Procedures: No new testing needed  Follow-Up: At [facility], you and your health needs are our priority.  As part of our continuing mission to provide you with exceptional heart care, we have created designated Provider Care Teams.  These Care Teams include your primary Cardiologist (physician) and Advanced Practice Providers (APPs -  Physician Assistants and Nurse Practitioners) who all work together to provide you with the care you need, when you need it.  You will need a follow up appointment as needed  Providers on your designated Care Team:   Murray Hodgkins, NP Christell Faith, PA-C Cadence Kathlen Mody, [place]  COVID-19 Vaccine Information can be found at: [email or web address] For questions related to vaccine distribution or appointments, please email [email or web address] or call [phone].

## 2021-04-12 NOTE — Progress Notes (Signed)
Date:  04/13/2021   ID:  [name], DOB 1960-11-10, MRN [id number]  Patient Location:  [address] [place] [id number]   Provider location:   [facility], [place] office  PCP:  [name], DO  Cardiologist:  [facility]  Chief Complaint  Patient presents with   Chest Pain    Patient c/o fluttering in chest, chest pressure, shortness of breath and LE edema. Medications reviewed by the patient verbally.     History of Present Illness:    Jyla Hopf is a 60 y.o. female past medical history of palpitations, hypertension  Hypoglycemia various medication intolerances calcium score on 07/03/2019 which was 0.  Chronic dizziness unrelated to blood pressure, negative Holter, who presents for follow-up of her Blood pressure, chest pain  LOV  with myself 06/2020 Seen by one of our providers August 2022  Several months ago reported getting a massage in [place] when visiting, woman from [place] in country she was walking on her back, she appreciated a popping noise in her mediastinum Since then with on and off again chest discomfort, worse with palpation Currently symptoms are resolved  Reports that she is very active, does exercise every morning Takes her multiple blood pressure medications in the morning sometimes with minimal breakfast, feels dizzy on her walk, very staggery  Long discussion concerning her blood pressure medications Takes bystolic 10 in AM  Amlodipine 5  HCTZ in the morning  Blood pressure running low at home  EKG personally reviewed by myself on todays visit Sinus bradycardia rate 57 bpm no significant ST-T wave changes  Documented hypertension medication intolerances: Cardura - dizziness Amlodipine - lower extremity swelling; headache HCTZ - hypokalemia and dry mouth, with capsule form increased UOP output/cramps Losartan - dizziness and shortness of breath; malaise Lisinopril -  cough Benicar - lightheadedness and presyncope Spironolactone - breast fullness Eplerenone - breast tenderness  Total Chol 184/ LDL 114 CT coronary calcium score 0   Past Medical History:  Diagnosis Date   Allergy    Anxiety    Chicken pox    Chronic Dizziness    Colon polyps    Concussion    Frequent headaches    GERD (gastroesophageal reflux disease)    History of stress test    a. 2005 - in setting of ectopy - reportedly nl.   Hypertension    a. 03/2018 24hr ABM: Mean BP 146/75, mean HR 61; Mean awake BP/HR 150/78, 62; Mean sleep BP/HR 130/65, 58.   Migraines    Pre-eclampsia    with grand mal seizures   Seizure disorder in pregnancy ([place])    Seizures ([place])    with pregnancy 1981- pre clampsia    Past Surgical History:  Procedure Laterality Date   COLONOSCOPY     DILATION AND CURETTAGE OF UTERUS  09/08/2014   with hystereoscopy   POLYPECTOMY     TONSILLECTOMY     TONSILLECTOMY     TUBAL LIGATION       Current Meds  Medication Sig   azelastine (OPTIVAR) 0.05 % ophthalmic solution Place 1 drop into both eyes 2 (two) times daily as needed (itchy/watery eyes).   Azelastine-Fluticasone 137-50 MCG/ACT SUSP Place 1 spray into the nose in the morning and at bedtime.   BIOTIN PO Take by mouth.   Cholecalciferol (VITAMIN D-3) 1000 UNITS CAPS Take 1 capsule by mouth daily.   CINNAMON PO Take 1,000 mg by mouth daily.  meloxicam (MOBIC) 7.5 MG tablet Take 7.5 mg by mouth as needed.   Multiple Vitamins-Minerals (MULTIVITAMIN WITH MINERALS) tablet Take 1 tablet by mouth daily.   pantoprazole (PROTONIX) 40 MG tablet TAKE 1 TABLET BY MOUTH TWICE A DAY BEFORE A MEAL   Probiotic Product (PROBIOTIC PO) Take 1 tablet by mouth daily. Align   zinc gluconate 50 MG tablet Take 50 mg by mouth daily.   [DISCONTINUED] amLODipine (NORVASC) 5 MG tablet Take 1 tablet (5 mg total) by mouth daily.   [DISCONTINUED] BYSTOLIC 10 MG tablet Take 1 tablet (10 mg total) by mouth daily.    [DISCONTINUED] hydrochlorothiazide (HYDRODIURIL) 12.5 MG tablet Take 12.5 mg by mouth daily.     Allergies:   Losartan; Anesthetics, amide; Benadryl [diphenhydramine hcl (sleep)]; Benicar [olmesartan]; Cardura [doxazosin mesylate]; Codeine; Fentanyl; Losartan potassium-hctz; Metformin and related; and Prilocaine   Social History   Tobacco Use   Smoking status: Never   Smokeless tobacco: Never  Vaping Use   Vaping Use: Never used  Substance Use Topics   Alcohol use: Yes    Comment: very rare - glass red wine when that    Drug use: No     Family Hx: The patient's family history includes Cancer in her maternal grandfather; Colon cancer in her maternal grandfather; Diabetes in her father and mother; Heart disease in her father; Hypertension in her father and mother; Kidney disease in her father. There is no history of Colon polyps, Breast cancer, Esophageal cancer, Rectal cancer, or Stomach cancer.  ROS:   Please see the history of present illness.    Review of Systems  Constitutional: Negative.   HENT: Negative.    Respiratory: Negative.    Cardiovascular:  Positive for chest pain.  Gastrointestinal: Negative.   Musculoskeletal: Negative.   Neurological:  Positive for dizziness.  Psychiatric/Behavioral: Negative.    All other systems reviewed and are negative.   Labs/Other Tests and Data Reviewed:    Recent Labs: No results found for requested labs within last 8760 hours.   Recent Lipid Panel Lab Results  Component Value Date/Time   CHOL 184 06/24/2019 09:06 AM   TRIG 50 06/24/2019 09:06 AM   HDL 60 06/24/2019 09:06 AM   CHOLHDL 3.1 06/24/2019 09:06 AM   CHOLHDL 3 08/27/2015 08:06 AM   LDLCALC 114 (H) 06/24/2019 09:06 AM   LDLDIRECT 113.0 09/16/2014 12:08 PM    Wt Readings from Last 3 Encounters:  04/12/21 187 lb (84.8 kg)  02/11/21 191 lb (86.6 kg)  02/01/21 190 lb 6 oz (86.4 kg)     Exam:    Vital Signs: Vital signs may also be detailed in the HPI BP 100/70  (BP Location: Left Arm, Patient Position: Sitting, Cuff Size: Normal)   Pulse (!) 57   Ht 5\' 4"  (1.626 m)   Wt 187 lb (84.8 kg)   LMP 12/01/2013 (Approximate)   SpO2 98%   BMI 32.10 kg/m   Constitutional:  oriented to person, place, and time. No distress.  HENT:  Head: Grossly normal Eyes:  no discharge. No scleral icterus.  Neck: No JVD, no carotid bruits  Cardiovascular: Regular rate and rhythm, no murmurs appreciated Pulmonary/Chest: Clear to auscultation bilaterally, no wheezes or rails Abdominal: Soft.  no distension.  no tenderness.  Musculoskeletal: Normal range of motion Neurological:  normal muscle tone. Coordination normal. No atrophy Skin: Skin warm and dry Psychiatric: normal affect, pleasant  ASSESSMENT & PLAN:    Essential hypertension, benign Given dizziness in the morning recommended  we move the amlodipine 5 to the evening Continue HCTZ and Bystolic in the morning If still dizzy in the morning, could change Bystolic to 5 twice daily rather than 10 AM She prefers to stay on amlodipine, reports it works well for her blood pressure  Leg swelling Stable, may need compression hose Would like to stay on amlodipine  anxiety Managed by primary care Doing better now that she is retired  Dizziness Chronic issue, unrelated to hypertension Etiology unclear No further work-up at this time Symptoms are stable at this time  Chest pain Atypical in nature, Likely musculoskeletal from recent trauma as detailed above  Palpitations Continue beta-blocker, Symptoms are relatively stable  Long discussion concerning chest pain, hypertension management, risk stratification  Total encounter time more than 35 minutes  Greater than 50% was spent in counseling and coordination of care with the patient    Signed, Ida Rogue, MD  04/13/2021 2:21 PM    [place] [facility] [address], [place], [place] [address]

## 2021-04-22 ENCOUNTER — Other Ambulatory Visit: Payer: No Typology Code available for payment source

## 2021-04-27 ENCOUNTER — Emergency Department
Admission: EM | Admit: 2021-04-27 | Discharge: 2021-04-27 | Disposition: A | Discharge status: Home / Self Care | Payer: No Typology Code available for payment source | Attending: Emergency Medicine | Admitting: Emergency Medicine

## 2021-04-27 ENCOUNTER — Other Ambulatory Visit: Payer: Self-pay

## 2021-04-27 DIAGNOSIS — Z79899 Other long term (current) drug therapy: Secondary | ICD-10-CM | POA: Diagnosis not present

## 2021-04-27 DIAGNOSIS — I10 Essential (primary) hypertension: Secondary | ICD-10-CM | POA: Insufficient documentation

## 2021-04-27 DIAGNOSIS — U071 COVID-19: Secondary | ICD-10-CM | POA: Diagnosis not present

## 2021-04-27 DIAGNOSIS — R519 Headache, unspecified: Secondary | ICD-10-CM | POA: Diagnosis present

## 2021-04-27 LAB — CBG MONITORING, ED: Glucose-Capillary: 116 mg/dL — ABNORMAL HIGH (ref 70–99)

## 2021-04-27 LAB — RESP PANEL BY RT-PCR (FLU A&B, COVID) ARPGX2
Influenza A by PCR: NEGATIVE
Influenza B by PCR: NEGATIVE
SARS Coronavirus 2 by RT PCR: POSITIVE — AB

## 2021-04-27 NOTE — ED Triage Notes (Signed)
Pt states she took a walk like normal on Monday and has had a headache ever since, pt states that she has had some nasal drainage and a dry cough, she also had some bodyaches.

## 2021-04-27 NOTE — ED Provider Notes (Signed)
[facility] Emergency Department Provider Note  ____________________________________________   Event Date/Time   First MD Initiated Contact with Patient 04/27/21 0715     (approximate)  I have reviewed the triage vital signs and the nursing notes.   HISTORY  Chief Complaint Headache   HPI Nicole Gould is a 60 y.o. female presents to the ED with complaint of possible fever and having chills for the last 2 days.  Patient states that she has had nasal congestion, sore throat, dry cough and body aches.  She reports that this had a sudden onset.  She denies any vomiting or diarrhea.  She is unaware of any known exposure to COVID or influenza.         Past Medical History:  Diagnosis Date   Allergy    Anxiety    Chicken pox    Chronic Dizziness    Colon polyps    Concussion    Frequent headaches    GERD (gastroesophageal reflux disease)    History of stress test    a. 2005 - in setting of ectopy - reportedly nl.   Hypertension    a. 03/2018 24hr ABM: Mean BP 146/75, mean HR 61; Mean awake BP/HR 150/78, 62; Mean sleep BP/HR 130/65, 58.   Migraines    Pre-eclampsia    with grand mal seizures   Seizure disorder in pregnancy ([place])    Seizures ([place])    with pregnancy 1981- pre clampsia     Patient Active Problem List   Diagnosis Date Noted   Gastroesophageal reflux disease 08/30/2019   Hyperlipidemia 06/26/2019   Dysesthesia 06/24/2019   Dry mouth, unspecified 06/24/2019   Vaccine counseling 06/24/2019   Obesity (BMI 30.0-34.9) 05/06/2019   Insomnia due to stress, anxiety and fear 06/06/2018   Noncompliance with diet and medication regimen 04/21/2018   Mood disorder ([place]) 03/07/2018   Polyp of corpus uteri 02/08/2018   Hx of seizure disorder 11/10/2017   Migraine without aura and without status migrainosus, not intractable 11/10/2017   Prediabetes 11/10/2017   Spells of trembling 11/10/2017   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    Reactive hypoglycemia 03/25/2017   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   Obesity with body mass index 30 or greater 08/09/2016   History of adenomatous polyp of colon 08/04/2016   Seasonal and perennial allergic rhinoconjunctivitis 07/12/2016   Chronic suprapubic pain 05/30/2015   Menopausal symptom 10/25/2014   Primary insomnia 05/05/2014   Anxiety 05/05/2014   Chronic venous insufficiency 04/21/2014   Adenomatous polyp of colon 05/12/2013   Pain 03/11/2013   Essential hypertension, benign 01/28/2013   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 Authorizing Provider  amLODipine (NORVASC) 5 MG tablet Take 1 tablet (5 mg total) by mouth daily. 04/12/21   Minna Merritts, MD  azelastine (OPTIVAR) 0.05 % ophthalmic solution Place 1 drop into both eyes 2 (two) times daily as needed (itchy/watery eyes). 11/10/19   Garnet Sierras, DO  Azelastine-Fluticasone 819 090 3286 MCG/ACT SUSP Place 1 spray into the nose in the morning and at bedtime. 09/03/19   Garnet Sierras, DO  BIOTIN  PO Take by mouth.    [provider]  BYSTOLIC 10 MG tablet Take 1 tablet (10 mg total) by mouth daily. 04/12/21   Minna Merritts, MD  Cholecalciferol (VITAMIN D-3) 1000 UNITS CAPS Take 1 capsule by mouth daily.    [provider]  CINNAMON PO Take 1,000 mg by mouth daily.     [provider]  hydrochlorothiazide (HYDRODIURIL) 12.5 MG tablet Take 1 tablet (12.5 mg total) by mouth daily. 04/12/21   Minna Merritts, MD  meloxicam (MOBIC) 7.5 MG tablet Take 7.5 mg by mouth as needed. 09/14/20   [provider]  Multiple Vitamins-Minerals (MULTIVITAMIN WITH MINERALS) tablet Take 1 tablet by  mouth daily.    [provider]  pantoprazole (PROTONIX) 40 MG tablet TAKE 1 TABLET BY MOUTH TWICE A DAY BEFORE A MEAL 10/13/20   Pyrtle, Lajuan Lines, MD  Probiotic Product (PROBIOTIC PO) Take 1 tablet by mouth daily. Align    [provider]  sucralfate (CARAFATE) 1 g tablet Take 1 tablet (1 g total) by mouth 4 (four) times daily -  with meals and at bedtime. Patient not taking: No sig reported 01/07/21   Gatha Mayer, MD  zinc gluconate 50 MG tablet Take 50 mg by mouth daily.    [provider]    Allergies Losartan; Anesthetics, amide; Benadryl [diphenhydramine hcl (sleep)]; Benicar [olmesartan]; Cardura [doxazosin mesylate]; Codeine; Fentanyl; Losartan potassium-hctz; Metformin and related; and Prilocaine  Family History  Problem Relation Age of Onset   Hypertension Mother    Diabetes Mother    Heart disease Father    Kidney disease Father    Hypertension Father    Diabetes Father    Cancer Maternal Grandfather        colon cancer   Colon cancer Maternal Grandfather        passed age 39    Colon polyps Neg Hx    Breast cancer Neg Hx    Esophageal cancer Neg Hx    Rectal cancer Neg Hx    Stomach cancer Neg Hx     Social History Social History   Tobacco Use   Smoking status: Never   Smokeless tobacco: Never  Vaping Use   Vaping Use: Never used  Substance Use Topics   Alcohol use: Yes    Comment: very rare - glass red wine when that    Drug use: No    Review of Systems Constitutional: Positive fever/positive chills Eyes: No visual changes. ENT: Positive sore throat. Cardiovascular: Denies chest pain. Respiratory: Denies shortness of breath.  Positive cough. Gastrointestinal: No abdominal pain.  No nausea, no vomiting.  No diarrhea.   Musculoskeletal: Positive body aches. Skin: Negative for rash. Neurological: Positive headache, negative for focal weakness or numbness.  ____________________________________________   PHYSICAL  EXAM:  VITAL SIGNS: ED Triage Vitals  Enc Vitals Group     BP 04/27/21 0644 (!) 155/91     Pulse Rate 04/27/21 0644 80     Resp 04/27/21 0644 18     Temp 04/27/21 0644 99.2 F (37.3 C)     Temp Source 04/27/21 0644 Oral     SpO2 04/27/21 0644 99 %     Weight 04/27/21 0643 186 lb 15.2 oz (84.8 kg)     Height 04/27/21 0643 5\' 4"  (1.626 m)     Head Circumference --      Peak Flow --      Pain Score 04/27/21 0642 10  Pain Loc --      Pain Edu? --      Excl. in [place]? --     Constitutional: Alert and oriented. Well appearing and in no acute distress. Eyes: Conjunctivae are normal. PERRL. EOMI. Head: Atraumatic. Nose: No congestion/rhinnorhea. Mouth/Throat: Mucous membranes are moist.  Oropharynx non-erythematous.  No exudate present. Neck: No stridor.   Hematological/Lymphatic/Immunilogical: No cervical lymphadenopathy. Cardiovascular: Normal rate, regular rhythm. Grossly normal heart sounds.  Good peripheral circulation. Respiratory: Normal respiratory effort.  No retractions. Lungs CTAB. Gastrointestinal: Soft and nontender. No distention.  Musculoskeletal:  Neurologic:  Normal speech and language. No gross focal neurologic deficits are appreciated. No gait instability. Skin:  Skin is warm, dry and intact. No rash noted. Psychiatric: Mood and affect are normal. Speech and behavior are normal.  ____________________________________________   LABS (all labs ordered are listed, but only abnormal results are displayed)  Labs Reviewed  RESP PANEL BY RT-PCR (FLU A&B, COVID) ARPGX2 - Abnormal; Notable for the following components:      Result Value   SARS Coronavirus 2 by RT PCR POSITIVE (*)    All other components within normal limits  CBG MONITORING, ED - Abnormal; Notable for the following components:   Glucose-Capillary 116 (*)    All other components within normal limits   ____________________________________________  PROCEDURES  Procedure(s) performed (including  Critical Care):  Procedures   ____________________________________________   INITIAL IMPRESSION / ASSESSMENT AND PLAN / ED COURSE  As part of my medical decision making, I reviewed the following data within the electronic MEDICAL RECORD NUMBER Notes from prior ED visits and [place] Controlled Substance Database  60 year old female presents to the ED with complaint of 3 to 4 days of body aches, nonproductive cough and headache.  Patient is also had some nasal congestion.  She is unaware of any known sick contacts.  Physical exam is benign.  Respiratory swab was positive for COVID.  Patient was made aware that she was negative for influenza.  Patient has a history of being prediabetic but is not taking any prescribed medication.  She reports that she had a banana with her medication this morning and her glucose fingerstick was 116.  She is encouraged to drink fluids, take Tylenol or ibuprofen and rest.  She is to follow-up with her PCP if any continued problems and made aware that she should return to the emergency department if any severe worsening of her symptoms or difficulty breathing.   ____________________________________________   FINAL CLINICAL IMPRESSION(S) / ED DIAGNOSES  Final diagnoses:  [place]     ED Discharge Orders     None        Note:  This document was prepared using Dragon voice recognition software and may include unintentional dictation errors.    Johnn Hai, PA-C 04/27/21 1149    Harvest Dark, MD 04/27/21 1505

## 2021-04-27 NOTE — Discharge Instructions (Signed)
Call your primary care provider if any continued problems.  Drink plenty of fluids to stay hydrated.  Tylenol or ibuprofen as needed for body aches, fever or headache.  Rest and eat well.  You are contagious and should quarantine for a period of 5 days since you have had the COVID-vaccine.  Return to the emergency department should you develop any shortness of breath or difficulty breathing.

## 2021-05-08 ENCOUNTER — Other Ambulatory Visit: Payer: Self-pay

## 2021-05-08 ENCOUNTER — Ambulatory Visit
Admission: RE | Admit: 2021-05-08 | Discharge: 2021-05-08 | Disposition: A | Discharge status: Home / Self Care | Payer: No Typology Code available for payment source | Source: Ambulatory Visit | Attending: Physical Medicine & Rehabilitation | Admitting: Physical Medicine & Rehabilitation

## 2021-05-08 DIAGNOSIS — M25511 Pain in right shoulder: Secondary | ICD-10-CM

## 2021-05-16 ENCOUNTER — Ambulatory Visit: Payer: No Typology Code available for payment source | Admitting: Cardiovascular Disease

## 2021-05-22 ENCOUNTER — Other Ambulatory Visit: Payer: Self-pay | Admitting: Internal Medicine

## 2021-06-21 ENCOUNTER — Ambulatory Visit: Payer: No Typology Code available for payment source | Admitting: Internal Medicine

## 2021-07-05 ENCOUNTER — Ambulatory Visit: Payer: No Typology Code available for payment source | Admitting: Cardiovascular Disease

## 2021-07-05 ENCOUNTER — Telehealth: Payer: Self-pay | Admitting: Cardiovascular Disease

## 2021-07-05 NOTE — Telephone Encounter (Signed)
Per patient prior auth renewal needed for bystolic refill .  Please see previous auth per patient reason is allergy to generic .   Patient requesting a call back to discuss because she is almost of meds and leaving town.

## 2021-07-07 NOTE — Telephone Encounter (Signed)
Called patients insurance company to get Brand penalty exception form faxed to our office since the patient can only take Brand.  Spoke with Zenia Resides -- he stated that he could not fax the form to me but he will be sending the form to the patient and the patient will need to bring it to [place].   Called patient -- No answer. LMOV to call back to make her aware of the above message

## 2021-07-08 NOTE — Telephone Encounter (Signed)
Spoke with patient - she stated that she has already spoke with Insurance and that her Authorization is still good.  Made her aware of the message below.  Patient states she only has 3 days left of this medication.   She is calling pharmacy now to see if they will run it back through -- she stated that it was $300 and she has never paid that much for medication.

## 2021-07-08 NOTE — Telephone Encounter (Signed)
Has she confirmed with her insurance if they have changed the tier for bystolic this year.  I am not sure why the cost increase if she has already been receiving the branded drug.

## 2021-07-11 ENCOUNTER — Other Ambulatory Visit: Payer: Self-pay

## 2021-07-11 ENCOUNTER — Ambulatory Visit
Admission: EM | Admit: 2021-07-11 | Discharge: 2021-07-11 | Disposition: A | Discharge status: Home / Self Care | Payer: No Typology Code available for payment source | Attending: Internal Medicine | Admitting: Internal Medicine

## 2021-07-11 ENCOUNTER — Encounter: Payer: Self-pay | Admitting: Emergency Medicine

## 2021-07-11 DIAGNOSIS — M545 Low back pain, unspecified: Secondary | ICD-10-CM | POA: Diagnosis not present

## 2021-07-11 MED ORDER — PREDNISONE 10 MG (21) PO TBPK: ORAL_TABLET | Freq: Every day | ORAL | 0 refills | Status: DC
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
## 2021-07-11 NOTE — Telephone Encounter (Signed)
Noted- will forward to Refill pool & triage pool to look out for this.

## 2021-07-11 NOTE — Discharge Instructions (Signed)
You have been prescribed prednisone steroid to help alleviate inflammation and pain.  Please alternate ice and heat application to affected area of pain.  Follow-up with your spine specialist for further evaluation and management.  Go to the hospital if symptoms significantly worsen.

## 2021-07-11 NOTE — Telephone Encounter (Signed)
She did not confirm this, she just stated the message below.  She seemed to be in a hurry to get off the phone.
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
## 2021-07-11 NOTE — Telephone Encounter (Signed)
I called and spoke with [place] to inquire if the patient had a Bystolic branded RX ready for pick up. Per the pharmacy staff, the patient does have branded RX ready for pick up-- $190/ 3 month supply.  I have called the patient and notified her of my conversation with the pharmacy staff as of just prior to calling her. Per the patient, she was paying ~ $15/ 90-day RX last year for branded Bystolic. I advised her, with the new year she may need a new coupon on file for bystolic, but the patient proceeded to advise me that her husband took a coupon to the pharmacy. She then spoke with [facility] and they advised that the federal government was not going to honor the coupon. I advised her I have not heard of this. The patient confirms she cannot afford $190/ 3 month supply.  She states that when she spoke with [facility] last week, that someone from [place] was going to reach out to our office to advise what they needed from Dr. Rockey Situ, but a rep also advised her they were going to work on getting her medication at a reduced cost.  I have advised the patient that I have not seen any correspondence with [facility]. Per the patient, she was going to call them back and find out what is going on. She will call our office back to notify us of what is needed at that time.   The patient was appreciative of the call back.

## 2021-07-11 NOTE — Telephone Encounter (Signed)
Patient states PAF will be faxed for Gollan to sign

## 2021-07-12 NOTE — Telephone Encounter (Signed)
Brand exception form obtained from [employer]- will review for completion.

## 2021-07-12 NOTE — Telephone Encounter (Signed)
Form has been signed.  Faxed back to [facility] - [phone] Conformation fax received.

## 2021-07-12 NOTE — Telephone Encounter (Signed)
Called patient to verify her reaction to [place]    Patient stated that the reactions are: - dizzy - feeling sick to her stomach - Lightheaded - tired / weak  - leg cramps - swelling in legs.

## 2021-07-12 NOTE — Telephone Encounter (Signed)
Brand Exception form has been received.  Gave to Judson Roch, RN -- Triage nurse to review since [facility], RN is out.

## 2021-07-12 NOTE — Telephone Encounter (Signed)
Form reviewed and completed.  Awaiting MD signature prior to being able to fax this back to [facility].

## 2021-07-13 NOTE — Telephone Encounter (Signed)
Patient calling back to notify that she was able to get her prescription for $15

## 2021-07-13 NOTE — Telephone Encounter (Signed)
Her pharmacy was incorrect in telling her she could not use a copay card. She has Pharmacist, community and can use a copay card which is likely the reason her copay was lower last year. They need to run her insurance as primary and copay card as secondary to reduce her Bystolic price.

## 2021-07-13 NOTE — Telephone Encounter (Addendum)
Brand penalty exception form completed for [facility] on 0/2/11 for Bystolic 10 mg tablets.  Dx- Benign essential hypertension (I10) given.  Questions asked on the form were: 1) Had the patient experienced an inadequate treatment response (tried and failed) with generic alternative: - patient reports side effects with the generic which include: dizziness/ nausea/ lightheadedness/ fatigue/ weakness/ leg cramps/ leg swelling  2) Has the prescriber determined that the generic alternative is not appropriate based on a specific clinical condition (I.e allergy)? If yes, please document: - Yes-- side effects as listed above with generic bystolic  3) Has the patient been stabilized on a brand name medication for a speciific clinical conditions (I.e. fragile epilepsy, transplant, immunosuppresion, etc)? If you, please document. - the patient has been on bystolic since 17/3567. This has resulted in stabilization of her chronically elevated blood pressures from 014-103 systolic to 013-143O systolic readings.    Fax received today stating the request was denied.  Why your request was denied: Copay Issues- Your request for a lower co-pay is denied. You are are paying the lowest co-pay possible for the requested drug according to your prescription benefit plan. Bystolic 10 mg tablet #88/ 90 days- test claims paid- IBR for copay issues  Will forward to pharm D to see if there is anything else we can do or are missing.   Patient is adament she needs branded bystolic and paid ~ $75 for her RX last year. She advised me earlier this week her husband had tried to use another coupon this year and they were denied stating "the federal government will not accept this."  Her insurance is under her husband. I am uncertain if he works for [employer].

## 2021-07-13 NOTE — Telephone Encounter (Signed)
I called and spoke with the patient. I have advised her that we submitted paperwork to try to lower the cost of her drug, but this was denied.  I have also advised her I reached out to the pharmacy staff and was advised that the pharmacy needs to make sure they are running her commercial insurance as primary, then running the co-pay card as secondary for the $15 co-pay to apply. I have asked the patient to please reach out to her pharmacy to make sure they are doing this correctly.  The patient voices understanding and is agreeable.   She also advised she reached out to [employer] directly and they have sent her a patient assistance application to complete.  The patient will speak with her pharmacy first and let us know the outcome. I advised her we will be glad to complete the physician portion on the application for assistance if needed.  The patient was very appreciative of the call back.

## 2021-07-13 NOTE — Telephone Encounter (Signed)
Noted  

## 2021-07-20 ENCOUNTER — Telehealth: Payer: Self-pay | Admitting: Internal Medicine

## 2021-07-20 ENCOUNTER — Encounter: Payer: Self-pay | Admitting: Internal Medicine

## 2021-07-20 NOTE — Telephone Encounter (Signed)
Patient called to schedule her coloscopy appointment. Patient was scheduled for 3/30. After I got her scheduled she then asked if there was anyway she could have both colon and EGD done at the same time. Her last EGD was in 2021. Patient is seeking advice if there is anyway she can have both done at the same time. Please advise.

## 2021-07-20 NOTE — Telephone Encounter (Signed)
Discussed with pt that unless she was having issues we do not routinely do EGD's. Pt verbalized understanding and states she is not having any issues at this time.

## 2021-08-22 ENCOUNTER — Other Ambulatory Visit: Payer: Self-pay | Admitting: Family Medicine

## 2021-08-22 DIAGNOSIS — Z1231 Encounter for screening mammogram for malignant neoplasm of breast: Secondary | ICD-10-CM

## 2021-08-24 ENCOUNTER — Ambulatory Visit
Admission: RE | Admit: 2021-08-24 | Discharge: 2021-08-24 | Disposition: A | Discharge status: Home / Self Care | Payer: No Typology Code available for payment source | Source: Ambulatory Visit | Attending: Family Medicine | Admitting: Family Medicine

## 2021-08-24 DIAGNOSIS — Z1231 Encounter for screening mammogram for malignant neoplasm of breast: Secondary | ICD-10-CM

## 2021-09-01 ENCOUNTER — Other Ambulatory Visit: Payer: Self-pay

## 2021-09-01 ENCOUNTER — Ambulatory Visit (AMBULATORY_SURGERY_CENTER): Payer: No Typology Code available for payment source

## 2021-09-01 VITALS — Ht 64.0 in | Wt 188.0 lb

## 2021-09-01 DIAGNOSIS — Z86010 Personal history of colonic polyps: Secondary | ICD-10-CM

## 2021-09-01 MED ORDER — SUTAB 1479-225-188 MG PO TABS: 1.0000 | ORAL_TABLET | ORAL | 0 refills | Status: DC

## 2021-09-01 NOTE — Progress Notes (Signed)
No egg or soy allergy known to patient  ?No issues known to pt with past sedation with any surgeries or procedures ?Patient denies ever being told they had issues or difficulty with intubation  ?No FH of Malignant Hyperthermia ?Pt is not on diet pills ?Pt is not on home 02  ?Pt is not on blood thinners  ?Pt  reports issues with constipation -patient reports she has never been "regular" and "sometimes it is not easy to go" -patient reports she does not go like a "normal person";  ?No A fib or A flutter ?Pt is fully vaccinated for Covid x 2; ?Coupon to pt in PV today, Code to Pharmacy and NO PA's for preps discussed with pt in PV today  ?Discussed with pt there will be an out-of-pocket cost for prep and that varies from $0 to 70 + dollars - pt verbalized understanding  ?Due to the COVID-19 pandemic we are asking patients to follow certain guidelines in PV and the [place]   ?Pt aware of COVID protocols and [facility] guidelines  ?PV completed over the phone. Pt verified name, DOB, address and insurance during PV today.  ?Pt mailed instruction packet with copy of consent form to read and not return, and instructions.  ?Pt encouraged to call with questions or issues.  ?If pt has My chart, procedure instructions sent via My Chart  ? ?Patient requested to have Sutab RX instead of Suprep or other preps-patient verbalized understanding that cost will be out of pocket if coupon or insurance does not cover the cost of medication ? ?Additional time spent going over instructions and patient questions  ?

## 2021-09-09 ENCOUNTER — Ambulatory Visit: Payer: No Typology Code available for payment source | Attending: Internal Medicine | Admitting: Internal Medicine

## 2021-09-09 ENCOUNTER — Encounter: Payer: Self-pay | Admitting: Internal Medicine

## 2021-09-09 ENCOUNTER — Other Ambulatory Visit: Payer: Self-pay

## 2021-09-09 VITALS — BP 135/78 | HR 64 | Resp 16 | Ht 64.0 in | Wt 193.6 lb

## 2021-09-09 DIAGNOSIS — I10 Essential (primary) hypertension: Secondary | ICD-10-CM | POA: Diagnosis not present

## 2021-09-09 DIAGNOSIS — M25511 Pain in right shoulder: Secondary | ICD-10-CM | POA: Diagnosis not present

## 2021-09-09 DIAGNOSIS — M25512 Pain in left shoulder: Secondary | ICD-10-CM

## 2021-09-09 DIAGNOSIS — Z7689 Persons encountering health services in other specified circumstances: Secondary | ICD-10-CM

## 2021-09-09 DIAGNOSIS — G8929 Other chronic pain: Secondary | ICD-10-CM
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
## 2021-09-09 NOTE — Patient Instructions (Signed)
Try taking the Meloxicam daily for several weeks. ?Sign a release for me to get your records from previous primary. ?

## 2021-09-13 ENCOUNTER — Encounter: Payer: Self-pay | Admitting: Internal Medicine

## 2021-09-15 ENCOUNTER — Ambulatory Visit (AMBULATORY_SURGERY_CENTER): Payer: No Typology Code available for payment source | Admitting: Internal Medicine

## 2021-09-15 ENCOUNTER — Encounter: Payer: Self-pay | Admitting: Internal Medicine

## 2021-09-15 VITALS — BP 122/62 | HR 75 | Temp 98.6°F | Resp 15 | Ht 64.0 in | Wt 188.0 lb

## 2021-09-15 DIAGNOSIS — Z86010 Personal history of colonic polyps: Secondary | ICD-10-CM

## 2021-09-15 MED ORDER — SODIUM CHLORIDE 0.9 % IV SOLN: 500.0000 mL | Freq: Once | INTRAVENOUS | Status: DC

## 2021-09-15 MED ORDER — PANTOPRAZOLE SODIUM 40 MG PO TBEC: 40.0000 mg | DELAYED_RELEASE_TABLET | Freq: Every day | ORAL | 3 refills | Status: DC

## 2021-09-15 NOTE — Progress Notes (Signed)
PT taken to PACU. Monitors in place. VSS. Report given to RN. 

## 2021-09-15 NOTE — Patient Instructions (Signed)
?  Resume previous diet and current medications.  Repeat colonoscopy in 10 years for surveillance. ? ?YOU HAD AN ENDOSCOPIC PROCEDURE TODAY AT [facility]:   Refer to the procedure report that was given to you for any specific questions about what was found during the examination.  If the procedure report does not answer your questions, please call your gastroenterologist to clarify.  If you requested that your care partner not be given the details of your procedure findings, then the procedure report has been included in a sealed envelope for you to review at your convenience later. ? ?YOU SHOULD EXPECT: Some feelings of bloating in the abdomen. Passage of more gas than usual.  Walking can help get rid of the air that was put into your GI tract during the procedure and reduce the bloating. If you had a lower endoscopy (such as a colonoscopy or flexible sigmoidoscopy) you may notice spotting of blood in your stool or on the toilet paper. If you underwent a bowel prep for your procedure, you may not have a normal bowel movement for a few days. ? ?Please Note:  You might notice some irritation and congestion in your nose or some drainage.  This is from the oxygen used during your procedure.  There is no need for concern and it should clear up in a day or so. ? ?SYMPTOMS TO REPORT IMMEDIATELY: ? ?Following lower endoscopy (colonoscopy or flexible sigmoidoscopy): ? Excessive amounts of blood in the stool ? Significant tenderness or worsening of abdominal pains ? Swelling of the abdomen that is new, acute ? Fever of 100?F or higher ? ?For urgent or emergent issues, a gastroenterologist can be reached at any hour by calling [phone]. ?Do not use MyChart messaging for urgent concerns.  ? ? ?DIET:  We do recommend a small meal at first, but then you may proceed to your regular diet.  Drink plenty of fluids but you should avoid alcoholic beverages for 24 hours. ? ?ACTIVITY:  You should plan to take it  easy for the rest of today and you should NOT DRIVE or use heavy machinery until tomorrow (because of the sedation medicines used during the test).   ? ?FOLLOW UP: ?Our staff will call the number listed on your records 48-72 hours following your procedure to check on you and address any questions or concerns that you may have regarding the information given to you following your procedure. If we do not reach you, we will leave a message.  We will attempt to reach you two times.  During this call, we will ask if you have developed any symptoms of COVID 19. If you develop any symptoms (ie: fever, flu-like symptoms, shortness of breath, cough etc.) before then, please call [phone].  If you test positive for Covid 19 in the 2 weeks post procedure, please call and report this information to [place].   ? ?If any biopsies were taken you will be contacted by phone or by letter within the next 1-3 weeks.  Please call us at [phone] if you have not heard about the biopsies in 3 weeks.  ? ? ?SIGNATURES/CONFIDENTIALITY: ?You and/or your care partner have signed paperwork which will be entered into your electronic medical record.  These signatures attest to the fact that that the information above on your After Visit Summary has been reviewed and is understood.  Full responsibility of the confidentiality of this discharge information lies with you and/or your care-partner.  ?

## 2021-09-15 NOTE — Op Note (Signed)
[place] ?Patient Name: Chester Romero ?Procedure Date: 09/15/2021 7:49 AM ?MRN: [id number] ?Endoscopist: Jerene Bears , MD ?Age: 61 ?Referring MD:  ?Date of Birth: Apr 22, 1961 ?Gender: Female ?Account #: [id number] ?Procedure:                Colonoscopy ?Indications:              High risk colon cancer surveillance: Personal  ?                          history of non-advanced adenomas and SSP (4 in Dec  ?                          2014), Last colonoscopy: March 2018 (no adenomatous  ?                          polyps) ?Medicines:                Monitored Anesthesia Care ?Procedure:                Pre-Anesthesia Assessment: ?                          - Prior to the procedure, a History and Physical  ?                          was performed, and patient medications and  ?                          allergies were reviewed. The patient's tolerance of  ?                          previous anesthesia was also reviewed. The risks  ?                          and benefits of the procedure and the sedation  ?                          options and risks were discussed with the patient.  ?                          All questions were answered, and informed consent  ?                          was obtained. Prior Anticoagulants: The patient has  ?                          taken no previous anticoagulant or antiplatelet  ?                          agents. ASA Grade Assessment: II - A patient with  ?                          mild systemic disease. After reviewing the risks  ?  and benefits, the patient was deemed in  ?                          satisfactory condition to undergo the procedure. ?                          After obtaining informed consent, the colonoscope  ?                          was passed under direct vision. Throughout the  ?                          procedure, the patient's blood pressure, pulse, and  ?                          oxygen saturations were monitored continuously. The  ?                           CF [id number] #[id number] was introduced through the anus  ?                          and advanced to the cecum, identified by  ?                          appendiceal orifice and ileocecal valve. The  ?                          colonoscopy was performed without difficulty. The  ?                          patient tolerated the procedure well. The quality  ?                          of the bowel preparation was good. The ileocecal  ?                          valve, appendiceal orifice, and rectum were  ?                          photographed. ?Scope In: 8:09:31 AM ?Scope Out: 8:27:53 AM ?Scope Withdrawal Time: 0 hours 11 minutes 47 seconds  ?Total Procedure Duration: 0 hours 18 minutes 22 seconds  ?Findings:                 The digital rectal exam was normal. ?                          The entire examined colon appeared normal on direct  ?                          and retroflexion views. ?Complications:            No immediate complications. ?Estimated Blood Loss:     Estimated blood loss: none. ?Impression:               - The entire examined colon  is normal on direct and  ?                          retroflexion views. ?                          - No specimens collected. ?Recommendation:           - Patient has a contact number available for  ?                          emergencies. The signs and symptoms of potential  ?                          delayed complications were discussed with the  ?                          patient. Return to normal activities tomorrow.  ?                          Written discharge instructions were provided to the  ?                          patient. ?                          - Resume previous diet. ?                          - Continue present medications. ?                          - Repeat colonoscopy in 10 years for surveillance. ?Jerene Bears, MD ?09/15/2021 8:32:15 AM ?This report has been signed electronically. ?

## 2021-09-15 NOTE — Progress Notes (Signed)
Pt's states no medical or surgical changes since previsit or office visit. 

## 2021-09-15 NOTE — Progress Notes (Signed)
C.W. vital signs. 

## 2021-09-15 NOTE — Progress Notes (Signed)
? ?GASTROENTEROLOGY PROCEDURE H&P NOTE  ? ?Primary Care Physician: ?Nicole Morning, DO ? ? ? ?Reason for Procedure:   Hx of polyps ? ?Plan:    colonoscopy ? ?Patient is appropriate for endoscopic procedure(s) in the ambulatory ([place]) setting. ? ?The nature of the procedure, as well as the risks, benefits, and alternatives were carefully and thoroughly reviewed with the patient. Ample time for discussion and questions allowed. The patient understood, was satisfied, and agreed to proceed.  ? ? ? ?HPI: ?Nicole Gould is a 61 y.o. female who presents for colonoscopy for polyp surveillance.  Medical history as below.  Tolerated the prep.  No recent chest pain or shortness of breath.  No abdominal pain today. ? ?Past Medical History:  ?Diagnosis Date  ? Allergy   ? Anxiety   ? Chicken pox   ? Chronic Dizziness   ? Colon polyps   ? Concussion   ? Frequent headaches   ? GERD (gastroesophageal reflux disease)   ? on meds  ? History of stress test   ? a. 2005 - in setting of ectopy - reportedly nl.  ? Hypertension   ? a. 03/2018 24hr ABM: Mean BP 146/75, mean HR 61; Mean awake BP/HR 150/78, 62; Mean sleep BP/HR 130/65, 58.  ? Migraines   ? Pre-diabetes   ? Pre-eclampsia   ? with grand mal seizures  ? Seasonal allergies   ? Seizure disorder in pregnancy [facility])   ? Seizures ([place])   ? with pregnancy 1981- pre clampsia   ? ? ?Past Surgical History:  ?Procedure Laterality Date  ? COLONOSCOPY  2018  ? Corie Allis-MAC-suprep(exc)-HPP  ? DILATION AND CURETTAGE OF UTERUS  09/08/2014  ? with hystereoscopy  ? POLYPECTOMY  2018  ? HPP  ? TONSILLECTOMY    ? TUBAL LIGATION    ? ? ?Prior to Admission medications   ?Medication Sig Start Date End Date Taking? Authorizing Provider  ?amLODipine (NORVASC) 5 MG tablet Take 1 tablet (5 mg total) by mouth daily. 04/12/21  Yes Minna Merritts, MD  ?betamethasone, augmented, (DIPROLENE) 0.05 % lotion Apply 1 application. topically 2 (two) times daily as needed. 08/18/21  Yes [provider]   ?BYSTOLIC 10 MG tablet Take 1 tablet (10 mg total) by mouth daily. 04/12/21  Yes Minna Merritts, MD  ?Cholecalciferol (VITAMIN D-3) 1000 UNITS CAPS Take 1 capsule by mouth daily.   Yes [provider]  ?CINNAMON PO Take 1,000 mg by mouth daily.    Yes [provider]  ?glucose blood test strip See admin instructions. 08/17/21  Yes [provider]  ?hydrochlorothiazide (HYDRODIURIL) 12.5 MG tablet Take 1 tablet (12.5 mg total) by mouth daily. 04/12/21  Yes Minna Merritts, MD  ?Lancets (ONETOUCH DELICA PLUS [id number]) [place] SMARTSIG:1 Topical Daily 08/26/21  Yes [provider]  ?Multiple Vitamins-Minerals (MULTIVITAMIN WITH MINERALS) tablet Take 1 tablet by mouth daily.   Yes [provider]  ?Roma Schanz test strip 1 each daily. 08/26/21  Yes [provider]  ?pantoprazole (PROTONIX) 40 MG tablet TAKE 1 TABLET BY MOUTH TWICE A DAY BEFORE A MEAL 05/23/21  Yes Raeden Belzer, Lajuan Lines, MD  ?Sodium Sulfate-Mag Sulfate-KCl (SUTAB) [phone] MG TABS Take 1 kit by mouth as directed. SUTAB COUPON [id number] PCN:CN [id number] MEMBER [id number]; no prior authorization 09/01/21  Yes Kharma Sampsel, Lajuan Lines, MD  ?azelastine (OPTIVAR) 0.05 % ophthalmic solution Place 1 drop into both eyes 2 (two) times daily as needed (itchy/watery eyes). 11/10/19   Garnet Sierras, DO  ?  Azelastine-Fluticasone 137-50 MCG/ACT SUSP Place 1 spray into the nose in the Gould and at bedtime. 09/03/19   Garnet Sierras, DO  ?MAGNESIUM PO Take 1 tablet by mouth daily at 6 (six) AM.    [provider]  ?MELATONIN PO Take 1 tablet by mouth at bedtime.    [provider]  ?meloxicam (MOBIC) 7.5 MG tablet Take 7.5 mg by mouth as needed. 09/14/20   [provider]  ?tretinoin (RETIN-A) 0.025 % cream Apply to face nightly to every other night to tolerance. 08/18/21   [provider]  ? ? ?Current Outpatient Medications  ?Medication Sig Dispense Refill  ? amLODipine (NORVASC) 5 MG tablet  Take 1 tablet (5 mg total) by mouth daily. 90 tablet 3  ? betamethasone, augmented, (DIPROLENE) 0.05 % lotion Apply 1 application. topically 2 (two) times daily as needed.    ? BYSTOLIC 10 MG tablet Take 1 tablet (10 mg total) by mouth daily. 90 tablet 3  ? Cholecalciferol (VITAMIN D-3) 1000 UNITS CAPS Take 1 capsule by mouth daily.    ? CINNAMON PO Take 1,000 mg by mouth daily.     ? glucose blood test strip See admin instructions.    ? hydrochlorothiazide (HYDRODIURIL) 12.5 MG tablet Take 1 tablet (12.5 mg total) by mouth daily. 90 tablet 3  ? Lancets (ONETOUCH DELICA PLUS [id number]) MISC SMARTSIG:1 Topical Daily    ? Multiple Vitamins-Minerals (MULTIVITAMIN WITH MINERALS) tablet Take 1 tablet by mouth daily.    ? ONETOUCH VERIO test strip 1 each daily.    ? pantoprazole (PROTONIX) 40 MG tablet TAKE 1 TABLET BY MOUTH TWICE A DAY BEFORE A MEAL 180 tablet 1  ? Sodium Sulfate-Mag Sulfate-KCl (SUTAB) [phone] MG TABS Take 1 kit by mouth as directed. SUTAB COUPON [id number] PCN:CN [id number] MEMBER [id number]; no prior authorization 24 tablet 0  ? azelastine (OPTIVAR) 0.05 % ophthalmic solution Place 1 drop into both eyes 2 (two) times daily as needed (itchy/watery eyes). 6 mL 5  ? Azelastine-Fluticasone 137-50 MCG/ACT SUSP Place 1 spray into the nose in the Gould and at bedtime. 23 g 5  ? MAGNESIUM PO Take 1 tablet by mouth daily at 6 (six) AM.    ? MELATONIN PO Take 1 tablet by mouth at bedtime.    ? meloxicam (MOBIC) 7.5 MG tablet Take 7.5 mg by mouth as needed.    ? tretinoin (RETIN-A) 0.025 % cream Apply to face nightly to every other night to tolerance.    ? ?Current Facility-Administered Medications  ?Medication Dose Route Frequency Provider Last Rate Last Admin  ? 0.9 %  sodium chloride infusion  500 mL Intravenous Once Shakara Tweedy, Lajuan Lines, MD      ? ? ?Allergies as of 09/15/2021 - Review Complete 09/15/2021  ?Allergen Reaction Noted  ? Losartan Shortness Of Breath 04/30/2014  ? Anesthetics, amide  Nausea And Vomiting 03/31/2014  ? Benadryl [diphenhydramine hcl (sleep)]  05/03/2016  ? Benicar [olmesartan]  12/10/2017  ? Cardura [doxazosin mesylate] Other (See Comments) 01/14/2018  ? Codeine Other (See Comments) 07/27/2017  ? Fentanyl Hypertension 05/03/2016  ? Losartan potassium-hctz  04/30/2014  ? Metformin and related  12/10/2017  ? Prilocaine Nausea And Vomiting 03/31/2014  ? ? ?Family History  ?Problem Relation Age of Onset  ? Hypertension Mother   ? Diabetes Mother   ? Heart disease Father   ? Kidney disease Father   ? Hypertension Father   ? Diabetes Father   ? Cancer Maternal Grandfather   ?  colon cancer  ? Colon cancer Maternal Grandfather   ?     passed age 43  ? Colon polyps Neg Hx   ? Breast cancer Neg Hx   ? Esophageal cancer Neg Hx   ? Rectal cancer Neg Hx   ? Stomach cancer Neg Hx   ? ? ?Social History  ? ?Socioeconomic History  ? Marital status: Married  ?  Spouse name: Not on file  ? Number of children: Not on file  ? Years of education: Not on file  ? Highest education level: Not on file  ?Occupational History  ? Not on file  ?Tobacco Use  ? Smoking status: Never  ? Smokeless tobacco: Never  ?Vaping Use  ? Vaping Use: Never used  ?Substance and Sexual Activity  ? Alcohol use: Yes  ?  Comment: very rare - glass red wine when that   ? Drug use: No  ? Sexual activity: Yes  ?  Partners: Male  ?Other Topics Concern  ? Not on file  ?Social History Narrative  ? Not on file  ? ?Social Determinants of Health  ? ?Financial Resource Strain: Not on file  ?Food Insecurity: Not on file  ?Transportation Needs: Not on file  ?Physical Activity: Not on file  ?Stress: Not on file  ?Social Connections: Not on file  ?Intimate Partner Violence: Not on file  ? ? ?Physical Exam: ?Vital signs in last 24 hours: ?_0  126/76   Pulse 65   Temp 98.6 ?F (37 ?C)   Ht 5' 4" (1.626 m)   Wt 188 lb (85.3 kg)   LMP 12/01/2013 (Approximate)   SpO2 100%   BMI 32.27 kg/m?  ?GEN: NAD ?EYE: Sclerae anicteric ?ENT: MMM ?CV:  Non-tachycardic ?Pulm: CTA b/l ?GI: Soft, NT/ND ?NEURO:  Alert & Oriented x 3 ? ? ?Zenovia Jarred, MD ?[facility] ? ?09/15/2021 8:00 AM ? ?

## 2021-09-19 ENCOUNTER — Telehealth: Payer: Self-pay

## 2021-09-19 NOTE — Telephone Encounter (Signed)
?  Follow up Call- ? ? ?  09/15/2021  ?  7:26 AM 10/15/2019  ?  9:18 AM  ?Call back number  ?Post procedure Call Back phone  # [phone]  ?Permission to leave phone message Yes Yes  ?  ? ?Patient questions: ? ?Do you have a fever, pain , or abdominal swelling? No. ?Pain Score  0 * ? ?Have you tolerated food without any problems? Yes.   ? ?Have you been able to return to your normal activities? Yes.   ? ?Do you have any questions about your discharge instructions: ?Diet   No. ?Medications  No. ?Follow up visit  No. ? ?Do you have questions or concerns about your Care? No. ? ?Actions: ?* If pain score is 4 or above: ?No action needed, pain <4. ? ? ? ? ?

## 2021-10-20 ENCOUNTER — Other Ambulatory Visit: Payer: Self-pay | Admitting: Allergy

## 2021-11-03 ENCOUNTER — Telehealth: Payer: Self-pay | Admitting: Cardiovascular Disease

## 2021-11-03 NOTE — Telephone Encounter (Signed)
Pt c/o of Chest Pain: STAT if CP now or developed within 24 hours  1. Are you having CP right now? Little bit, but states it is costochondritis   2. Are you experiencing any other symptoms (ex. SOB, nausea, vomiting, sweating)? no  3. How long have you been experiencing CP? Over a year   4. Is your CP continuous or coming and going? Comes and goes  5. Have you taken Nitroglycerin? no   Patient states her costochondritis has been going on for over a year. She says she would just like to know how long this will take to go away. Patient has no new symptoms and her chest cramp has not gotten worse or more frequent.   ?
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
## 2021-11-08 ENCOUNTER — Ambulatory Visit: Payer: Self-pay

## 2021-11-08 NOTE — Telephone Encounter (Signed)
FYI

## 2021-11-08 NOTE — Telephone Encounter (Signed)
Pt says she feels sluggish, loss of appetite, not feeling like herself, has had to take tylenol for 4 days straight, slight cough at night, has had a headache, tested negative for covid.  Tylenol does not take away the headache, but when she takes zyrtec with the tylenol it helps so she thinks its sinus related. She woke up in a night sweat last night like a fever broke, she says she soaked her silk pajamas.   Called [facility], says Dr. Wynetta Emery is her PCP.   Best contact: [phone]   Also says she has felt dizzy     Chief Complaint: Pt. Is currently in [place]. Having fatigue,headaches,night sweats,sinus drainage. Has an appointment with PCP next month. Symptoms: Above Frequency: 4 days ago Pertinent Negatives: Patient denies fever Disposition: '[]'$ ED /'[]'$ Urgent Care (no appt availability in office) / '[]'$ Appointment(In office/virtual)/ '[]'$  [facility]/ '[]'$ Home Care/ '[]'$ Refused Recommended Disposition /'[]'$ [facility]/ '[x]'$  Follow-up with PCP Additional Notes: Requests PCP advise if she should go To UC or wait and see her in June. Please advise.  Answer Assessment - Initial Assessment Questions 1. DESCRIPTION: "Describe how you are feeling."     Fatigued 2. SEVERITY: "How bad is it?"  "Can you stand and walk?"   - MILD - Feels weak or tired, but does not interfere with work, school or normal activities   - [place] to stand and walk; weakness interferes with work, school, or normal activities   - SEVERE - Unable to stand or walk     Moderate 3. ONSET:  "When did the weakness begin?"     4 days ago 4. CAUSE: "What do you think is causing the weakness?"     Unsure 5. MEDICINES: "Have you recently started a new medicine or had a change in the amount of a medicine?"     No 6. OTHER SYMPTOMS: "Do you have any other symptoms?" (e.g., chest pain, fever, cough, SOB, vomiting, diarrhea, bleeding, other areas of pain)     Headache,sinus drainage, night sweats 7.  PREGNANCY: "Is there any chance you are pregnant?" "When was your last menstrual period?"     No  Protocols used: Weakness (Generalized) and Fatigue-A-AH

## 2021-12-13 ENCOUNTER — Ambulatory Visit: Payer: No Typology Code available for payment source | Attending: Nurse Practitioner | Admitting: Internal Medicine

## 2021-12-13 ENCOUNTER — Encounter: Payer: Self-pay | Admitting: Internal Medicine

## 2021-12-13 VITALS — BP 145/75 | HR 57 | Temp 98.7°F | Resp 16

## 2021-12-13 DIAGNOSIS — M25652 Stiffness of left hip, not elsewhere classified: Secondary | ICD-10-CM

## 2021-12-13 DIAGNOSIS — M25511 Pain in right shoulder: Secondary | ICD-10-CM

## 2021-12-13 DIAGNOSIS — R0789 Other chest pain: Secondary | ICD-10-CM | POA: Diagnosis not present

## 2021-12-13 DIAGNOSIS — G8929 Other chronic pain: Secondary | ICD-10-CM

## 2021-12-13 DIAGNOSIS — R790 Abnormal level of blood mineral: Secondary | ICD-10-CM

## 2021-12-13 DIAGNOSIS — M25651 Stiffness of right hip, not elsewhere classified: Secondary | ICD-10-CM | POA: Diagnosis not present

## 2021-12-13 DIAGNOSIS — I10 Essential (primary) hypertension: Secondary | ICD-10-CM | POA: Diagnosis not present

## 2021-12-13 DIAGNOSIS — M25512 Pain in left shoulder: Secondary | ICD-10-CM
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
## 2021-12-14 ENCOUNTER — Ambulatory Visit: Payer: No Typology Code available for payment source | Admitting: Nurse Practitioner

## 2021-12-14 ENCOUNTER — Telehealth: Payer: Self-pay | Admitting: Internal Medicine

## 2021-12-14 DIAGNOSIS — D72819 Decreased white blood cell count, unspecified: Secondary | ICD-10-CM

## 2021-12-14 LAB — MAGNESIUM: Magnesium: 2.5 mg/dL — ABNORMAL HIGH (ref 1.6–2.3)

## 2021-12-14 NOTE — Telephone Encounter (Signed)
Phone call placed to patient this morning.  Patient informed that her magnesium level is elevated.  She told me yesterday that she was taking magnesium from over-the-counter 2 tablets daily that seem to help with stiffness in her hips.  Advised that she cut back to 1 tablet daily or better yet stop the magnesium altogether and use Tylenol as needed.  Patient expressed understanding. Patient tells me that she forgot to mention to me yesterday that she has a history of low white blood cell count.  The last time it was checked by her previous PCP the count had increased a little bit.  She would like to have it rechecked.  I told her that I will place the order and she can come to the lab at her convenience to have it done.

## 2021-12-21 ENCOUNTER — Telehealth: Payer: Self-pay | Admitting: Internal Medicine

## 2021-12-21 NOTE — Telephone Encounter (Signed)
A user error has taken place: encounter opened in error, closed for administrative reasons.
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
## 2021-12-27 ENCOUNTER — Ambulatory Visit (INDEPENDENT_AMBULATORY_CARE_PROVIDER_SITE_OTHER): Payer: No Typology Code available for payment source | Admitting: Cardiovascular Disease

## 2021-12-27 ENCOUNTER — Telehealth: Payer: Self-pay | Admitting: Cardiovascular Disease

## 2021-12-27 ENCOUNTER — Encounter: Payer: Self-pay | Admitting: Cardiovascular Disease

## 2021-12-27 VITALS — BP 124/60 | HR 57 | Ht 64.0 in | Wt 187.5 lb

## 2021-12-27 DIAGNOSIS — R600 Localized edema: Secondary | ICD-10-CM

## 2021-12-27 DIAGNOSIS — E782 Mixed hyperlipidemia: Secondary | ICD-10-CM

## 2021-12-27 DIAGNOSIS — R0789 Other chest pain: Secondary | ICD-10-CM

## 2021-12-27 DIAGNOSIS — I10 Essential (primary) hypertension: Secondary | ICD-10-CM

## 2021-12-27 DIAGNOSIS — R0602 Shortness of breath: Secondary | ICD-10-CM

## 2021-12-27 DIAGNOSIS — R079 Chest pain, unspecified: Secondary | ICD-10-CM | POA: Diagnosis not present

## 2021-12-27 DIAGNOSIS — R42 Dizziness and giddiness: Secondary | ICD-10-CM

## 2021-12-27 MED ORDER — BYSTOLIC 10 MG PO TABS: 10.0000 mg | ORAL_TABLET | Freq: Every day | ORAL | 3 refills | Status: DC

## 2021-12-27 MED ORDER — HYDROCHLOROTHIAZIDE 12.5 MG PO TABS: 12.5000 mg | ORAL_TABLET | Freq: Every day | ORAL | 3 refills | Status: DC

## 2021-12-27 MED ORDER — AMLODIPINE BESYLATE 5 MG PO TABS: 5.0000 mg | ORAL_TABLET | Freq: Every day | ORAL | 3 refills | Status: DC

## 2021-12-27 NOTE — Telephone Encounter (Signed)
Med list updated

## 2021-12-27 NOTE — Telephone Encounter (Signed)
She states she takes she forgot to mention she takes a table spoon of Bragg's apple cider vinegar, 2-3 times a day. She would like this added to her chart and states if this is not okay for her to take to call her back.

## 2021-12-27 NOTE — Patient Instructions (Addendum)
We will request lipids from [facility]  (Dr. [name])  Medication Instructions:  No changes  If you need a refill on your cardiac medications before your next appointment, please call your pharmacy.   Lab work: No new labs needed  Testing/Procedures: No new testing needed  Follow-Up: At [facility], you and your health needs are our priority.  As part of our continuing mission to provide you with exceptional heart care, we have created designated Provider Care Teams.  These Care Teams include your primary Cardiologist (physician) and Advanced Practice Providers (APPs -  Physician Assistants and Nurse Practitioners) who all work together to provide you with the care you need, when you need it.  You will need a follow up appointment in 12 months  Providers on your designated Care Team:   Murray Hodgkins, NP Christell Faith, PA-C Cadence Kathlen Mody, [place]  COVID-19 Vaccine Information can be found at: [email or web address] For questions related to vaccine distribution or appointments, please email [email or web address] or call [phone].

## 2022-02-06 ENCOUNTER — Other Ambulatory Visit: Payer: Self-pay | Admitting: Nurse Practitioner

## 2022-02-06 ENCOUNTER — Ambulatory Visit: Payer: Self-pay

## 2022-02-06 ENCOUNTER — Encounter: Payer: Self-pay | Admitting: Nurse Practitioner

## 2022-02-06 ENCOUNTER — Ambulatory Visit: Payer: No Typology Code available for payment source | Attending: Nurse Practitioner | Admitting: Nurse Practitioner

## 2022-02-06 VITALS — BP 127/73 | HR 66 | Temp 97.8°F | Ht 64.0 in | Wt 190.6 lb

## 2022-02-06 DIAGNOSIS — R0782 Intercostal pain: Secondary | ICD-10-CM

## 2022-02-06 DIAGNOSIS — I10 Essential (primary) hypertension: Secondary | ICD-10-CM | POA: Diagnosis not present

## 2022-02-06 DIAGNOSIS — R0789 Other chest pain: Secondary | ICD-10-CM | POA: Diagnosis not present

## 2022-02-06 MED ORDER — DICLOFENAC SODIUM 1 % EX GEL: 4.0000 g | Freq: Four times a day (QID) | CUTANEOUS | 1 refills | Status: AC

## 2022-02-06 MED ORDER — CYCLOBENZAPRINE HCL 5 MG PO TABS: 5.0000 mg | ORAL_TABLET | Freq: Three times a day (TID) | ORAL | 1 refills | Status: DC | PRN
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
## 2022-02-06 NOTE — Addendum Note (Signed)
Addended by: Geryl Rankins on: 02/06/2022 04:12 PM   Modules accepted: Orders

## 2022-02-06 NOTE — Telephone Encounter (Signed)
Chief Complaint: Chest pain Symptoms: Pain to the right side (diagnosis given as costochondritis by cardiology), pain in back Frequency: Onset 1 year ago, pain constant and getting worse Pertinent Negatives: Patient denies other symptoms Disposition: '[]'$ ED /'[]'$ Urgent Care (no appt availability in office) / '[x]'$ Appointment(In office/virtual)/ '[]'$  [facility]/ '[]'$ Home Care/ '[]'$ Refused Recommended Disposition /'[]'$ [facility]/ '[]'$  Follow-up with PCP Additional Notes: Wanted to see Dr. Wynetta Emery, only available on Friday, she says she's going out of town on Thursday and will see any provider, scheduled today with Zelda.     Reason for Disposition  [1] Chest pain(s) lasting a few seconds AND [2] persists > 3 days  Answer Assessment - Initial Assessment Questions 1. LOCATION: "Where does it hurt?"       Right side 2. RADIATION: "Does the pain go anywhere else?" (e.g., into neck, jaw, arms, back)     No 3. ONSET: "When did the chest pain begin?" (Minutes, hours or days)      Over a year ago same pain 4. PATTERN: "Does the pain come and go, or has it been constant since it started?"  "Does it get worse with exertion?"      Constant, worse with taking deep breath.  5. DURATION: "How long does it last" (e.g., seconds, minutes, hours)     N/A 6. SEVERITY: "How bad is the pain?"  (e.g., Scale 1-10; mild, moderate, or severe)    - MILD (1-3): doesn't interfere with normal activities     - MODERATE (4-7): interferes with normal activities or awakens from sleep    - SEVERE (8-10): excruciating pain, unable to do any normal activities       8 7. CARDIAC RISK FACTORS: "Do you have any history of heart problems or risk factors for heart disease?" (e.g., angina, prior heart attack; diabetes, high blood pressure, high cholesterol, smoker, or strong family history of heart disease)     N/A 8. PULMONARY RISK FACTORS: "Do you have any history of lung disease?"  (e.g., blood clots in lung, asthma,  emphysema, birth control pills)     N/A 9. CAUSE: "What do you think is causing the chest pain?"     Costochondritis per cardiology 10. OTHER SYMPTOMS: "Do you have any other symptoms?" (e.g., dizziness, nausea, vomiting, sweating, fever, difficulty breathing, cough)       No 11. PREGNANCY: "Is there any chance you are pregnant?" "When was your last menstrual period?"       N/A  Protocols used: Chest Pain-A-AH

## 2022-02-07 LAB — BASIC METABOLIC PANEL
BUN/Creatinine Ratio: 17 (ref 12–28)
BUN: 13 mg/dL (ref 8–27)
CO2: 24 mmol/L (ref 20–29)
Calcium: 9.3 mg/dL (ref 8.7–10.3)
Chloride: 102 mmol/L (ref 96–106)
Creatinine, Ser: 0.75 mg/dL (ref 0.57–1.00)
Glucose: 91 mg/dL (ref 70–99)
Potassium: 3.8 mmol/L (ref 3.5–5.2)
Sodium: 143 mmol/L (ref 134–144)
eGFR: 89 mL/min/{1.73_m2} (ref >59)

## 2022-02-07 NOTE — Telephone Encounter (Signed)
Requested medication (s) are due for refill today: yes  Requested medication (s) are on the active medication list: yes  Last refill:  02/06/22-03/08/22 #200 g 1 refill  Future visit scheduled: no seen yesterday   Notes to clinic:  Pharmacy comment: Alternative Requested     Requested Prescriptions  Pending Prescriptions Disp Refills   diclofenac Sodium (VOLTAREN) 1 % GEL [Pharmacy Med Name: DICLOFENAC SODIUM 1% GEL] 200 g 1    Sig: APPLY 4 G TOPICALLY 4 TIMES DAILY     Analgesics:  Topicals Failed - 02/06/2022  2:14 PM      Failed - Manual Review: Labs are only required if the patient has taken medication for more than 8 weeks.      Failed - PLT in normal range and within 360 days    Platelets  Date Value Ref Range Status  08/26/2019 162 150 - 400 K/uL Final  06/24/2019 166 150 - 450 x10E3/uL Final         Failed - HGB in normal range and within 360 days    Hemoglobin  Date Value Ref Range Status  08/26/2019 13.2 12.0 - 15.0 g/dL Final  06/24/2019 13.4 11.1 - 15.9 g/dL Final         Failed - HCT in normal range and within 360 days    HCT  Date Value Ref Range Status  08/26/2019 40.9 36.0 - 46.0 % Final   Hematocrit  Date Value Ref Range Status  06/24/2019 39.3 34.0 - 46.6 % Final         Failed - Cr in normal range and within 360 days    Creatinine  Date Value Ref Range Status  09/08/2014 0.72 mg/dL Final    Comment:    0.44-1.00 NOTE: New Reference Range  08/25/14    Creatinine, Ser  Date Value Ref Range Status  02/06/2022 0.75 0.57 - 1.00 mg/dL Final         Failed - eGFR is 30 or above and within 360 days    EGFR (African American)  Date Value Ref Range Status  09/08/2014 >60  Final   GFR calc Af Amer  Date Value Ref Range Status  08/26/2019 >60 >60 mL/min Final   EGFR (Non-African Amer.)  Date Value Ref Range Status  09/08/2014 >60  Final    Comment:    eGFR values <41mL/min/1.73 m2 may be an indication of chronic kidney disease  (CKD). Calculated eGFR is useful in patients with stable renal function. The eGFR calculation will not be reliable in acutely ill patients when serum creatinine is changing rapidly. It is not useful in patients on dialysis. The eGFR calculation may not be applicable to patients at the low and high extremes of body sizes, pregnant women, and vegetarians.    GFR calc non Af Amer  Date Value Ref Range Status  08/26/2019 >60 >60 mL/min Final   GFR  Date Value Ref Range Status  04/27/2017 106.71 >60.00 mL/min Final   eGFR  Date Value Ref Range Status  02/06/2022 89 >59 mL/min/1.73 Final         Passed - Patient is not pregnant      Passed - Valid encounter within last 12 months    Recent Outpatient Visits           Yesterday Chest wall pain   [place], Vernia Buff, NP   1 month ago Essential hypertension   [place],  Dalbert Batman, MD   5 months ago Establishing care with new doctor, encounter for   [place] Ladell Pier, MD

## 2022-02-08 ENCOUNTER — Ambulatory Visit (HOSPITAL_BASED_OUTPATIENT_CLINIC_OR_DEPARTMENT_OTHER): Payer: No Typology Code available for payment source

## 2022-02-13 ENCOUNTER — Telehealth: Payer: Self-pay | Admitting: Emergency Medicine

## 2022-02-13 NOTE — Telephone Encounter (Signed)
Copied from [place] [phone]. Topic: Appointment Scheduling - Scheduling Inquiry for Clinic >> Feb 13, 2022 11:04 AM Leitha Schuller wrote: Patient states she is having surgery on 09-25 and requesting an appt w/ Dr. Wynetta Emery prior to that date  Pt states she was seen by a NP for intermittent chest pain and has a ct scan scheduled  Provider's next appt is 11-02  Pt states she can not wait that long  Please fu w/ pt and assist furthet

## 2022-02-14 ENCOUNTER — Ambulatory Visit
Admission: RE | Admit: 2022-02-14 | Discharge: 2022-02-14 | Disposition: A | Discharge status: Home / Self Care | Payer: No Typology Code available for payment source | Source: Ambulatory Visit | Attending: Nurse Practitioner | Admitting: Nurse Practitioner

## 2022-02-14 DIAGNOSIS — R0782 Intercostal pain: Secondary | ICD-10-CM

## 2022-02-15 NOTE — Telephone Encounter (Signed)
LVM for pt to RTC.-----DD,RMA

## 2022-02-16 ENCOUNTER — Other Ambulatory Visit (HOSPITAL_BASED_OUTPATIENT_CLINIC_OR_DEPARTMENT_OTHER): Payer: No Typology Code available for payment source

## 2022-02-17 NOTE — Telephone Encounter (Signed)
FYI: RTC spoke w/ pt she states the days given for 09/14 wasn't going to work due her upcoming trip that day. However pt was informed that the provider who ordered CT will review results, and she will be contacted. Did inform pt note will be sent to pcp to as FYI to see note. Pt states that she just wanted to make sure everything was ok before her surgery. Pt was re-informed that she will be contacted once provider Army Melia) who order CT reviews results Pt expressed understanding.---DD,RMA

## 2022-03-16 ENCOUNTER — Telehealth: Payer: Self-pay | Admitting: Cardiovascular Disease

## 2022-03-16 NOTE — Telephone Encounter (Signed)
Pt c/o BP issue: STAT if pt c/o blurred vision, one-sided weakness or slurred speech  1. What are your last 5 BP readings? 187/5 and pulse was 95 2. Are you having any other symptoms (ex. Dizziness, headache, blurred vision, passed out)? Very dizzy, headache  3. What is your BP issue? High blood pressure- she said Dr Rockey Situ had told her to take an extra pill when this happen she needs the nameof that pill right away pleasei

## 2022-03-16 NOTE — Telephone Encounter (Addendum)
Spoke with the patient. Patient sts that she rechecked her BP prior to me calling her, 142/80.  Patient sts that he previous BP readings was actually 178/85 55 bpm.  Patient had she had her orthopedic shoulder surgery on Mon 03/14/22. She reports mild discomfort post op.  Adv the patient that I did not see the documentation for an additional BP med with elevated readings. She is taking all of her medications as prescribed. Adv the patient that her current reading of 142/80 is acceptable and that an elevated BP reading can be expected with pain/discomfort.  Adv the patient to continue to monitor her BP and to call the office if her readings are consistently elevated. Patient agreeable with the plan and voiced appreciation for the call back.

## 2022-03-23 ENCOUNTER — Telehealth: Payer: Self-pay | Admitting: Internal Medicine

## 2022-03-23 NOTE — Telephone Encounter (Signed)
Patient called states she would like to speak to the nurse regarding her medication. Requesting a call back. Please call to advise.

## 2022-03-23 NOTE — Telephone Encounter (Signed)
Pt had surgery and was given linzess to take for constipation, she wanted to know if that was ok for her to take. Discussed with pt that it is fine for her to take.  Pt also states she had colon done in March and was told she needed a 10 year recall. She is nervous about this, states her maternal grandfather died from colon cancer. She thinks she would feel better if she had recall sooner than 10 years. Please advise.

## 2022-03-23 NOTE — Telephone Encounter (Signed)
With fam hx of to change recall to 5 yrs

## 2022-03-24 NOTE — Telephone Encounter (Signed)
Pt aware and recall change for 5 year recall 08/18/26

## 2022-05-18 ENCOUNTER — Telehealth: Payer: Self-pay | Admitting: Internal Medicine

## 2022-05-18 NOTE — Telephone Encounter (Signed)
Patient is calling wanting to know if taking Magnesium Citrate '250mg'$  is okay to help her go to the bathroom regularly. States her PCP told her she has high magnesium levels but she is questioning if it is the same as the regular Magnesium '250mg'$  which she was taking before but did not help her go to the bathroom. Please advise

## 2022-05-18 NOTE — Telephone Encounter (Signed)
Pt wanted to know if she can take magnesium citrate pills '250mg'$  for constipation. Discussed with pt that she can try taking it if it works for her.

## 2022-05-27 ENCOUNTER — Ambulatory Visit
Admission: EM | Admit: 2022-05-27 | Discharge: 2022-05-27 | Disposition: A | Discharge status: Home / Self Care | Payer: No Typology Code available for payment source | Attending: Physician Assistant | Admitting: Physician Assistant

## 2022-05-27 ENCOUNTER — Other Ambulatory Visit: Payer: Self-pay

## 2022-05-27 ENCOUNTER — Encounter: Payer: Self-pay | Admitting: Emergency Medicine

## 2022-05-27 DIAGNOSIS — J209 Acute bronchitis, unspecified: Secondary | ICD-10-CM

## 2022-05-27 MED ORDER — PROMETHAZINE-DM 6.25-15 MG/5ML PO SYRP: 2.5000 mL | ORAL_SOLUTION | Freq: Four times a day (QID) | ORAL | 0 refills | Status: DC | PRN

## 2022-05-27 MED ORDER — BENZONATATE 100 MG PO CAPS: 100.0000 mg | ORAL_CAPSULE | Freq: Three times a day (TID) | ORAL | 0 refills | Status: DC

## 2022-05-27 MED ORDER — TRIAMCINOLONE ACETONIDE 40 MG/ML IJ SUSP
40.0000 mg | Freq: Once | INTRAMUSCULAR | Status: AC
  Administered 2022-05-27: 40 mg via INTRAMUSCULAR

## 2022-05-27 MED ORDER — ALBUTEROL SULFATE HFA 108 (90 BASE) MCG/ACT IN AERS: 1.0000 | INHALATION_SPRAY | Freq: Four times a day (QID) | RESPIRATORY_TRACT | 0 refills | Status: DC | PRN

## 2022-05-27 NOTE — ED Provider Notes (Signed)
[name]    CSN: [id number] Arrival date & time: 05/27/22  1125      History   Chief Complaint Chief Complaint  Patient presents with   Cough    HPI Nicole Gould is a 61 y.o. female.   Patient here today for evaluation of cough and congestion she has had the last 5 days.  She states that she will have coughing fits and she has trouble discontinuing cough.  She has tried over-the-counter medication without resolution.  She does report low-grade fever initially but this is cleared.  She has not had any significant sore throat but does have some voice changes and hoarseness.  She has not had any ear pain.  She denies any vomiting or diarrhea.  She does have history of bronchitis and states that symptoms feel similar.  The history is provided by the patient.  Cough Associated symptoms: no chills, no ear pain, no eye discharge, no fever (resolved), no shortness of breath, no sore throat and no wheezing     Past Medical History:  Diagnosis Date   Allergy    Anxiety    Chicken pox    Chronic Dizziness    Colon polyps    Concussion    Frequent headaches    GERD (gastroesophageal reflux disease)    on meds   History of stress test    a. 2005 - in setting of ectopy - reportedly nl.   Hypertension    a. 03/2018 24hr ABM: Mean BP 146/75, mean HR 61; Mean awake BP/HR 150/78, 62; Mean sleep BP/HR 130/65, 58.   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   Seasonal allergies    Seizure disorder in pregnancy ([place])    Seizures ([place])    with pregnancy 1981- pre clampsia     Patient Active Problem List   Diagnosis Date Noted   Gastroesophageal reflux disease 08/30/2019   Hyperlipidemia 06/26/2019   Dysesthesia 06/24/2019   Dry mouth, unspecified 06/24/2019   Vaccine counseling 06/24/2019   Obesity (BMI 30.0-34.9) 05/06/2019   Insomnia due to stress, anxiety and fear 06/06/2018   Noncompliance with diet and medication regimen  04/21/2018   Mood disorder ([place]) 03/07/2018   Polyp of corpus uteri 02/08/2018   Hx of seizure disorder 11/10/2017   Migraine without aura and without status migrainosus, not intractable 11/10/2017   Prediabetes 11/10/2017   Spells of trembling 11/10/2017   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   Reactive hypoglycemia 03/25/2017   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   Obesity with body mass index 30 or greater 08/09/2016   History of adenomatous polyp of colon 08/04/2016   Seasonal and perennial allergic rhinoconjunctivitis 07/12/2016   Chronic suprapubic pain 05/30/2015   Menopausal symptom 10/25/2014   Primary insomnia 05/05/2014   Anxiety 05/05/2014   Chronic venous insufficiency 04/21/2014   Adenomatous polyp of colon 05/12/2013   Pain 03/11/2013   Essential hypertension, benign 01/28/2013   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      Home Medications    Prior to Admission medications   Medication  Sig Start Date End Date Taking? Authorizing Provider  albuterol (VENTOLIN HFA) 108 (90 Base) MCG/ACT inhaler Inhale 1-2 puffs into the lungs every 6 (six) hours as needed for wheezing or shortness of breath. 05/27/22  Yes Francene Finders, PA-C  benzonatate (TESSALON) 100 MG capsule Take 1 capsule (100 mg total) by mouth every 8 (eight) hours. 05/27/22  Yes Francene Finders, PA-C  promethazine-dextromethorphan (PROMETHAZINE-DM) 6.25-15 MG/5ML syrup Take 2.5 mLs by mouth 4 (four) times daily as needed for cough. 05/27/22  Yes Francene Finders, PA-C  amLODipine (NORVASC) 5 MG tablet Take 1 tablet (5 mg total) by mouth daily. 12/27/21   Minna Merritts, MD  APPLE CIDER VINEGAR  PO Take 15 mLs by mouth in the morning, at noon, and at bedtime. Takes 1 tbsp 2-3 times daily    [provider]  azelastine (OPTIVAR) 0.05 % ophthalmic solution Place 1 drop into both eyes 2 (two) times daily as needed (itchy/watery eyes). 11/10/19   Garnet Sierras, DO  Azelastine-Fluticasone [phone] MCG/ACT SUSP Place 1 spray into the nose in the morning and at bedtime. 09/03/19   Garnet Sierras, DO  betamethasone, augmented, (DIPROLENE) 0.05 % lotion Apply 1 application. topically 2 (two) times daily as needed. 08/18/21   [provider]  BYSTOLIC 10 MG tablet Take 1 tablet (10 mg total) by mouth daily. 12/27/21   Minna Merritts, MD  CINNAMON PO Take 1,000 mg by mouth daily.     [provider]  cyclobenzaprine (FLEXERIL) 5 MG tablet Take 1 tablet (5 mg total) by mouth 3 (three) times daily as needed for muscle spasms. 02/06/22   Gildardo Pounds, NP  glucose blood test strip See admin instructions. 08/17/21   [provider]  hydrochlorothiazide (HYDRODIURIL) 12.5 MG tablet Take 1 tablet (12.5 mg total) by mouth daily. 12/27/21   Minna Merritts, MD  Lancets (ONETOUCH DELICA PLUS [id number]) MISC SMARTSIG:1 Topical Daily 08/26/21   [provider]  MELATONIN PO Take 1 tablet by mouth at bedtime.    [provider]  meloxicam (MOBIC) 7.5 MG tablet Take 7.5 mg by mouth as needed. 09/14/20   [provider]  Multiple Vitamins-Minerals (MULTIVITAMIN WITH MINERALS) tablet Take 1 tablet by mouth daily.    [provider]  [facility] VERIO test strip 1 each daily. 08/26/21   [provider]  pantoprazole (PROTONIX) 40 MG tablet Take 1 tablet (40 mg total) by mouth daily. 09/15/21   Pyrtle, Lajuan Lines, MD    Family History Family History  Problem Relation Age of Onset   Hypertension Mother    Diabetes Mother    Heart disease Father    Kidney disease Father    Hypertension Father    Diabetes Father    Cancer Maternal Grandfather         colon cancer   Colon cancer Maternal Grandfather        passed age 87   Colon polyps Neg Hx    Breast cancer Neg Hx    Esophageal cancer Neg Hx    Rectal cancer Neg Hx    Stomach cancer Neg Hx     Social History Social History   Tobacco Use   Smoking status: Never   Smokeless tobacco: Never  Vaping Use   Vaping Use: Never used  Substance Use Topics   Alcohol use: Yes    Comment: very rare - glass red wine when that    Drug use: No  Allergies   Losartan; Anesthetics, amide; Benadryl [diphenhydramine hcl (sleep)]; Benicar [olmesartan]; Cardura [doxazosin mesylate]; Codeine; Fentanyl; Losartan potassium-hctz; Metformin and related; and Prilocaine   Review of Systems Review of Systems  Constitutional:  Negative for chills and fever (resolved).  HENT:  Positive for congestion and voice change. Negative for ear pain and sore throat.   Eyes:  Negative for discharge and redness.  Respiratory:  Positive for cough. Negative for shortness of breath and wheezing.   Gastrointestinal:  Negative for abdominal pain, diarrhea, nausea and vomiting.     Physical Exam Triage Vital Signs ED Triage Vitals  Enc Vitals Group     BP 05/27/22 1407 128/81     Pulse Rate 05/27/22 1407 67     Resp 05/27/22 1407 18     Temp 05/27/22 1407 98.2 F (36.8 C)     Temp Source 05/27/22 1407 Oral     SpO2 05/27/22 1407 97 %     Weight --      Height --      Head Circumference --      Peak Flow --      Pain Score 05/27/22 1408 2     Pain Loc --      Pain Edu? --      Excl. in [place]? --    No data found.  Updated Vital Signs BP 128/81 (BP Location: Left Arm)   Pulse 67   Temp 98.2 F (36.8 C) (Oral)   Resp 18   LMP 12/01/2013 (Approximate)   SpO2 97%     Physical Exam Vitals and nursing note reviewed.  Constitutional:      General: She is not in acute distress.    Appearance: Normal appearance. She is not ill-appearing.  HENT:     Head: Normocephalic and atraumatic.     Nose:  Congestion present.     Mouth/Throat:     Mouth: Mucous membranes are moist.     Pharynx: No oropharyngeal exudate or posterior oropharyngeal erythema.     Comments: Hoarse voice noted Eyes:     Conjunctiva/sclera: Conjunctivae normal.  Cardiovascular:     Rate and Rhythm: Normal rate and regular rhythm.     Heart sounds: Normal heart sounds. No murmur heard. Pulmonary:     Effort: Pulmonary effort is normal. No respiratory distress.     Breath sounds: Normal breath sounds. No wheezing, rhonchi or rales.     Comments:  Patient with persistent cough- dry, hacking Skin:    General: Skin is warm and dry.  Neurological:     Mental Status: She is alert.  Psychiatric:        Mood and Affect: Mood normal.        Thought Content: Thought content normal.      UC Treatments / Results  Labs (all labs ordered are listed, but only abnormal results are displayed) Labs Reviewed - No data to display  EKG   Radiology No results found.  Procedures Procedures (including critical care time)  Medications Ordered in UC Medications  triamcinolone acetonide (KENALOG-40) injection 40 mg (40 mg Intramuscular Given 05/27/22 1454)    Initial Impression / Assessment and Plan / UC Course  I have reviewed the triage vital signs and the nursing notes.  Pertinent labs & imaging results that were available during my care of the patient were reviewed by me and considered in my medical decision making (see chart for details).    Suspect bronchitis- patient prefers steroid injection over oral  steroid prescription. Kenalog administered in office as well as prescription sent for tessalon pearles, promethazine DM, albuterol inhaler. Discussed that cough syrup may cause drowsiness and to use with caution. Encouraged follow up if no gradual improvement or with any further concerns.   Final Clinical Impressions(s) / UC Diagnoses   Final diagnoses:  Acute bronchitis, unspecified organism   Discharge  Instructions   None    ED Prescriptions     Medication Sig Dispense Auth. Provider   benzonatate (TESSALON) 100 MG capsule Take 1 capsule (100 mg total) by mouth every 8 (eight) hours. 21 capsule Ewell Poe F, PA-C   promethazine-dextromethorphan (PROMETHAZINE-DM) 6.25-15 MG/5ML syrup Take 2.5 mLs by mouth 4 (four) times daily as needed for cough. 118 mL Ewell Poe F, PA-C   albuterol (VENTOLIN HFA) 108 (90 Base) MCG/ACT inhaler Inhale 1-2 puffs into the lungs every 6 (six) hours as needed for wheezing or shortness of breath. 8 g Francene Finders, PA-C      PDMP not reviewed this encounter.   Francene Finders, PA-C 05/27/22 1456

## 2022-05-27 NOTE — ED Triage Notes (Signed)
Pt here for cough and congestion x 5 days; pt sts taking OTC meds

## 2022-05-29 ENCOUNTER — Ambulatory Visit: Payer: No Typology Code available for payment source | Attending: Internal Medicine | Admitting: Internal Medicine

## 2022-05-29 ENCOUNTER — Ambulatory Visit: Payer: Self-pay | Admitting: *Deleted

## 2022-05-29 ENCOUNTER — Encounter: Payer: Self-pay | Admitting: Internal Medicine

## 2022-05-29 VITALS — BP 144/75 | HR 74 | Temp 97.8°F | Ht 64.0 in | Wt 195.0 lb

## 2022-05-29 DIAGNOSIS — B9789 Other viral agents as the cause of diseases classified elsewhere: Secondary | ICD-10-CM | POA: Diagnosis not present

## 2022-05-29 DIAGNOSIS — J988 Other specified respiratory disorders: Secondary | ICD-10-CM | POA: Diagnosis not present

## 2022-05-29 MED ORDER — FLUTICASONE PROPIONATE 50 MCG/ACT NA SUSP: 1.0000 | Freq: Every day | NASAL | 6 refills | Status: DC

## 2022-05-29 MED ORDER — GUAIFENESIN-CODEINE 100-10 MG/5ML PO SOLN: 5.0000 mL | Freq: Three times a day (TID) | ORAL | 0 refills | Status: DC | PRN

## 2022-05-29 NOTE — Telephone Encounter (Signed)
Summary: congestion and cough   Pt states she has a lot of congestion and cough  Pt states she went to UC on 12-09 and the medication she was given is not working  Pt requesting a same day appt  Please fu w/ pt       Chief Complaint: worsening coughing spells when deep breathing Symptoms: productive cough brownish/ yellow thick mucus. Blowing nose for thick clear mucus. Deep breathing causes coughing spells and reports wheezing at times. Last seen UC 05/27/22 steroid inj. Given and cough medication. Continues with sx.  Frequency: 05/23/22 Pertinent Negatives: Patient denies chest pain no difficulty breathing no fever.  Disposition: '[]'$ ED /'[]'$ Urgent Care (no appt availability in office) / '[x]'$ Appointment(In office/virtual)/ '[]'$  [facility]/ '[]'$ Home Care/ '[]'$ Refused Recommended Disposition /'[]'$ [facility]/ '[]'$  Follow-up with PCP Additional Notes:   Appt today patient reports she is coming as fast as she can.      Reason for Disposition  SEVERE coughing spells (e.g., whooping sound after coughing, vomiting after coughing)  Answer Assessment - Initial Assessment Questions 1. ONSET: "When did the cough begin?"      Last Tuesday 05/23/22 and seen in UC 05/27/22 2. SEVERITY: "How bad is the cough today?"      Bad at level 10 on scale 0-10 3. SPUTUM: "Describe the color of your sputum" (none, dry cough; clear, white, yellow, green)     Brown/ yellow in am  coughed up  and blows nose for clear. 4. HEMOPTYSIS: "Are you coughing up any blood?" If so ask: "How much?" (flecks, streaks, tablespoons, etc.)     no 5. DIFFICULTY BREATHING: "Are you having difficulty breathing?" If Yes, ask: "How bad is it?" (e.g., mild, moderate, severe)    - MILD: No SOB at rest, mild SOB with walking, speaks normally in sentences, can lie down, no retractions, pulse < 100.    - MODERATE: SOB at rest, SOB with minimal exertion and prefers to sit, cannot lie down flat, speaks in phrases, mild  retractions, audible wheezing, pulse 100-120.    - SEVERE: Very SOB at rest, speaks in single words, struggling to breathe, sitting hunched forward, retractions, pulse > 120      Moderate can not lay flat . 6. FEVER: "Do you have a fever?" If Yes, ask: "What is your temperature, how was it measured, and when did it start?"     No  7. CARDIAC HISTORY: "Do you have any history of heart disease?" (e.g., heart attack, congestive heart failure)      na 8. LUNG HISTORY: "Do you have any history of lung disease?"  (e.g., pulmonary embolus, asthma, emphysema)     na 9. PE RISK FACTORS: "Do you have a history of blood clots?" (or: recent major surgery, recent prolonged travel, bedridden)     na 10. OTHER SYMPTOMS: "Do you have any other symptoms?" (e.g., runny nose, wheezing, chest pain)       Runny nose cough productive brownish/ yellow in color. Coughing spells when deep breathing  11. PREGNANCY: "Is there any chance you are pregnant?" "When was your last menstrual period?"       na 12. TRAVEL: "Have you traveled out of the country in the last month?" (e.g., travel history, exposures)       na  Protocols used: Cough - Acute Productive-A-AH

## 2022-05-29 NOTE — Progress Notes (Signed)
Patient ID: Nicole Gould, female    DOB: [birth year]  MRN: [id number]  CC: Cough (Consistent cough, congestion, hoarse, gray/ brown phlegm X6 days. )   Subjective: Nicole Gould is a 61 y.o. female who presents for UC visit Her concerns today include:  Pt with hx of HTN, HL  Patient complains of acute respiratory symptoms that started 1 week ago.  Started with scratchy throat.  She started taking zinc and vitamin C.  Later that evening she developed nasal congestion with mucus drainage, productive cough, body aches and chills.  A few days later she developed hoarseness of the voice.  She has been drinking tea with honey and lemon.  Seen at urgent care 05/27/2022 for her symptoms.  Diagnosed with acute bronchitis.  Given cough syrup with Tessalon Perles and an albuterol inhaler. Today: Still coughing quite a bit.  She describes it as a hacking cough productive of of white phlegm.  Still has a lot of nasal drainage.  Shortness of breath sometimes with coughing.  No further fever or bodyaches.  She has not had to take Tylenol. She did not do a COVID test at home and 1 was not done on recent urgent care visit. She has not had her flu vaccine or COVID-vaccine as yet.   Reports being diagnosed with prediabetes prior to left shoulder surgery.  Reports seeing an endocrinologist since then and was rechecked and told that she was good. Patient Active Problem List   Diagnosis Date Noted   Gastroesophageal reflux disease 08/30/2019   Hyperlipidemia 06/26/2019   Dysesthesia 06/24/2019   Dry mouth, unspecified 06/24/2019   Vaccine counseling 06/24/2019   Obesity (BMI 30.0-34.9) 05/06/2019   Insomnia due to stress, anxiety and fear 06/06/2018   Noncompliance with diet and medication regimen 04/21/2018   Mood disorder ([place]) 03/07/2018   Polyp of corpus uteri 02/08/2018   Hx of seizure disorder 11/10/2017   Migraine without aura and without status migrainosus, not intractable 11/10/2017    Prediabetes 11/10/2017   Spells of trembling 11/10/2017   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   Reactive hypoglycemia 03/25/2017   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   Obesity with body mass index 30 or greater 08/09/2016   History of adenomatous polyp of colon 08/04/2016   Seasonal and perennial allergic rhinoconjunctivitis 07/12/2016   Chronic suprapubic pain 05/30/2015   Menopausal symptom 10/25/2014   Primary insomnia 05/05/2014   Anxiety 05/05/2014   Chronic venous insufficiency 04/21/2014   Adenomatous polyp of colon 05/12/2013   Pain 03/11/2013   Essential hypertension, benign 01/28/2013   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 8 g 0   amLODipine (NORVASC) 5 MG tablet Take 1 tablet (5 mg total) by mouth daily. 90 tablet 3   APPLE CIDER VINEGAR PO Take 15 mLs by mouth in the morning, at noon, and at bedtime. Takes 1 tbsp 2-3 times daily     azelastine (OPTIVAR) 0.05 % ophthalmic solution Place 1 drop into both eyes 2 (two) times daily as needed (itchy/watery eyes). 6 mL 5   Azelastine-Fluticasone 137-50 MCG/ACT SUSP Place 1 spray into the nose in the morning and at bedtime. 23 g 5   benzonatate (TESSALON) 100 MG capsule Take 1 capsule (100  mg total) by mouth every 8 (eight) hours. 21 capsule 0   betamethasone, augmented, (DIPROLENE) 0.05 % lotion Apply 1 application. topically 2 (two) times daily as needed.     BYSTOLIC 10 MG tablet Take 1 tablet (10 mg total) by mouth daily. 90 tablet 3   CINNAMON PO Take 1,000 mg by mouth daily.      cyclobenzaprine (FLEXERIL) 5 MG tablet Take 1 tablet (5 mg total) by mouth 3 (three) times daily as needed for muscle spasms. 60 tablet 1   glucose blood test strip See admin  instructions.     hydrochlorothiazide (HYDRODIURIL) 12.5 MG tablet Take 1 tablet (12.5 mg total) by mouth daily. 90 tablet 3   Lancets (ONETOUCH DELICA PLUS [id number]) MISC SMARTSIG:1 Topical Daily     MELATONIN PO Take 1 tablet by mouth at bedtime.     meloxicam (MOBIC) 7.5 MG tablet Take 7.5 mg by mouth as needed.     Multiple Vitamins-Minerals (MULTIVITAMIN WITH MINERALS) tablet Take 1 tablet by mouth daily.     ONETOUCH VERIO test strip 1 each daily.     pantoprazole (PROTONIX) 40 MG tablet Take 1 tablet (40 mg total) by mouth daily. 90 tablet 3   No current facility-administered medications on file prior to visit.    Allergies  Allergen Reactions   Losartan Shortness Of Breath    Dizziness,  Shortness of breath    Anesthetics, Amide Nausea And Vomiting   Benadryl [Diphenhydramine Hcl (Sleep)]     Made feel paralyzed when had benadryl with a cocktail for procedure  Decadron, bendayl, regaln combo at [facility] 2014- see care every where    Benicar [Olmesartan]     Presyncopal , light headed    Cardura [Doxazosin [place] Other (See Comments)    "makes me feel terrible"   Fentanyl Hypertension    Severe htn    Losartan Potassium-Hctz     Dizziness,  Shortness of breath    Metformin And Related     "out of body experience"     Prilocaine Nausea And Vomiting    Social History   Socioeconomic History   Marital status: Married    Spouse name: Not on file   Number of children: Not on file   Years of education: Not on file   Highest education level: Not on file  Occupational History   Not on file  Tobacco Use   Smoking status: Never   Smokeless tobacco: Never  Vaping Use   Vaping Use: Never used  Substance and Sexual Activity   Alcohol use: Yes    Comment: very rare - glass red wine when that    Drug use: No   Sexual activity: Yes    Partners: Male  Other Topics Concern   Not on file  Social History Narrative   Not on file   Social Determinants of Health    Financial Resource Strain: Not on file  Food Insecurity: Not on file  Transportation Needs: Not on file  Physical Activity: Not on file  Stress: Not on file  Social Connections: Not on file  Intimate Partner Violence: Not on file    Family History  Problem Relation Age of Onset   Hypertension Mother    Diabetes Mother    Heart disease Father    Kidney disease Father    Hypertension Father    Diabetes Father    Cancer Maternal Grandfather        colon cancer   Colon cancer Maternal  Grandfather        passed age 92   Colon polyps Neg Hx    Breast cancer Neg Hx    Esophageal cancer Neg Hx    Rectal cancer Neg Hx    Stomach cancer Neg Hx     Past Surgical History:  Procedure Laterality Date   COLONOSCOPY  2018   Pyrtle-MAC-suprep(exc)-HPP   DILATION AND CURETTAGE OF UTERUS  09/08/2014   with hystereoscopy   POLYPECTOMY  2018   HPP   TONSILLECTOMY     TUBAL LIGATION      ROS: Review of Systems Negative except as stated above  PHYSICAL EXAM: BP (!) 144/75 (BP Location: Left Arm, Patient Position: Sitting, Cuff Size: Normal)   Pulse 74   Temp 97.8 F (36.6 C) (Oral)   Ht _0  (1.626 m)   Wt 195 lb (88.5 kg)   LMP 12/01/2013 (Approximate)   SpO2 99%   BMI 33.47 kg/m   Physical Exam  General appearance - alert, well appearing, middle age AAF and in no distress Mental status - normal mood, behavior, speech, dress, motor activity, and thought processes Nose - mild enlargement of nasal turbinates Mouth - mucous membranes moist, pharynx normal without lesions Neck - supple, no significant adenopathy Chest - clear to auscultation, no wheezes, rales or rhonchi, symmetric air entry Heart - normal rate, regular rhythm, normal S1, S2, no murmurs, rubs, clicks or gallops      Latest Ref Rng & Units 02/06/2022    2:13 PM 08/26/2019    3:42 PM 06/24/2019    9:06 AM  CMP  Glucose 70 - 99 mg/dL 91  96  97   BUN 8 - 27 mg/dL _1 Creatinine 0.57 - 1.00 mg/dL  0.75  0.78  0.77   Sodium 134 - 144 mmol/L 143  138  141   Potassium 3.5 - 5.2 mmol/L 3.8  3.5  4.2   Chloride 96 - 106 mmol/L 102  102  104   CO2 20 - 29 mmol/L _2 Calcium 8.7 - 10.3 mg/dL 9.3  9.5  9.4   Total Protein 6.0 - 8.5 g/dL   7.0   Total Bilirubin 0.0 - 1.2 mg/dL   0.8   Alkaline Phos 39 - 117 IU/L   45   AST 0 - 40 IU/L   28   ALT 0 - 32 IU/L   38    Lipid Panel     Component Value Date/Time   CHOL 184 06/24/2019 0906   TRIG 50 06/24/2019 0906   HDL 60 06/24/2019 0906   CHOLHDL 3.1 06/24/2019 0906   CHOLHDL 3 08/27/2015 0806   VLDL 11.0 08/27/2015 0806   LDLCALC 114 (H) 06/24/2019 0906   LDLDIRECT 113.0 09/16/2014 1208    CBC    Component Value Date/Time   WBC 3.5 (L) 08/26/2019 1542   RBC 4.35 08/26/2019 1542   HGB 13.2 08/26/2019 1542   HGB 13.4 06/24/2019 0906   HCT 40.9 08/26/2019 1542   HCT 39.3 06/24/2019 0906   PLT 162 08/26/2019 1542   PLT 166 06/24/2019 0906   MCV 94.0 08/26/2019 1542   MCV 91 06/24/2019 0906   MCV 92 09/08/2014 2059   MCH 30.3 08/26/2019 1542   MCHC 32.3 08/26/2019 1542   RDW 13.2 08/26/2019 1542   RDW 13.9 06/24/2019 0906   RDW 14.3 09/08/2014 2059   LYMPHSABS 1.4 06/24/2019 0906  MONOABS 0.2 10/04/2017 1208   EOSABS 0.0 06/24/2019 0906   BASOSABS 0.0 06/24/2019 0906    ASSESSMENT AND PLAN:  1. Viral respiratory illness Patient with acute viral respiratory illness.  Differential diagnoses include a common cold versus COVID.  She is agreeable to having a COVID test done today but I explained to her also that she is outside the window of thus using antiviral medication if she is positive.  We will treat symptomatically. For the sinus drainage.  I recommend Flonase nasal spray and Claritin both of which can be purchased over-the-counter. Stop current cough syrup.  Will put her on Robitussin with codeine to use as needed.  Advised that it can cause drowsiness because of the codeine.  I see on her allergy list,  codeine is listed with comment that it made her feel weird.  Patient indicated that she does not have an allergy to codeine and has taken cough syrup in the past with codeine in it without any issues. - fluticasone (FLONASE) 50 MCG/ACT nasal spray; Place 1 spray into both nostrils daily.  Dispense: 9.9 g; Refill: 6 - guaiFENesin-codeine 100-10 MG/5ML syrup; Take 5 mLs by mouth every 8 (eight) hours as needed for cough.  Dispense: 120 mL; Refill: 0 - Novel Coronavirus, NAA ([facility])    Patient was given the opportunity to ask questions.  Patient verbalized understanding of the plan and was able to repeat key elements of the plan.   This documentation was completed using Radio producer.  Any transcriptional errors are unintentional.  Orders Placed This Encounter  Procedures   Novel Coronavirus, NAA ([facility])     Requested Prescriptions   Signed Prescriptions Disp Refills   fluticasone (FLONASE) 50 MCG/ACT nasal spray 9.9 g 6    Sig: Place 1 spray into both nostrils daily.   guaiFENesin-codeine 100-10 MG/5ML syrup 120 mL 0    Sig: Take 5 mLs by mouth every 8 (eight) hours as needed for cough.    No follow-ups on file.  Karle Plumber, MD, FACP

## 2022-05-31 LAB — NOVEL CORONAVIRUS, NAA: SARS-CoV-2, NAA: NOT DETECTED

## 2022-07-28 ENCOUNTER — Telehealth: Payer: Self-pay | Admitting: Emergency Medicine

## 2022-07-28 DIAGNOSIS — R7303 Prediabetes: Secondary | ICD-10-CM

## 2022-07-28 DIAGNOSIS — I10 Essential (primary) hypertension: Secondary | ICD-10-CM

## 2022-07-28 NOTE — Telephone Encounter (Signed)
Copied from [place] [id number]. Topic: Appointment Scheduling - Scheduling Inquiry for Clinic >> Jul 28, 2022 12:47 PM Oley Balm E wrote: Reason for CRM: Pt called to request an appt early in the am to have her CPE labs prior to appt.   Best contact: [phone] >> Jul 28, 2022 12:53 PM Sabas Sous wrote: Pt wants to have her lab work for her CPE's prior to her appt Monday march 11

## 2022-07-31 NOTE — Telephone Encounter (Addendum)
Patient called back checking status on if she can come in earlier for labs before her physical on March 6 because of her BS not eating.

## 2022-07-31 NOTE — Addendum Note (Signed)
Addended by: Karle Plumber B on: 07/31/2022 06:26 PM   Modules accepted: Orders

## 2022-08-01 ENCOUNTER — Encounter: Payer: No Typology Code available for payment source | Admitting: Internal Medicine

## 2022-08-01 NOTE — Telephone Encounter (Signed)
Called LVM to call back

## 2022-08-04 NOTE — Telephone Encounter (Signed)
Called & spoke to the patient. Verified name & DOB. Lab appointment scheduled for 08/23/2021.

## 2022-08-07 ENCOUNTER — Other Ambulatory Visit: Payer: Self-pay | Admitting: Family Medicine

## 2022-08-07 DIAGNOSIS — Z1231 Encounter for screening mammogram for malignant neoplasm of breast: Secondary | ICD-10-CM

## 2022-08-09 ENCOUNTER — Other Ambulatory Visit: Payer: Self-pay | Admitting: Internal Medicine

## 2022-08-22 ENCOUNTER — Telehealth: Payer: Self-pay | Admitting: Cardiovascular Disease

## 2022-08-22 NOTE — Telephone Encounter (Signed)
Pt c/o medication issue:  1. Name of Medication: BYSTOLIC 10 MG tablet   2. How are you currently taking this medication (dosage and times per day)? As prescribed   3. Are you having a reaction (difficulty breathing--STAT)?   4. What is your medication issue? Pt states she can not afford this medication and that the system is not allowing her to use coupon given. She states she only has 3 pills left and needs assistance.

## 2022-08-22 NOTE — Telephone Encounter (Signed)
Returned the call to the patient. She stated that her Bystolic coupon was not working due to a system error on the Bystolic side. She wanted to know if we had any samples. She has been advised that we do not have samples.   She has been advised the best thing would be for her to call the company and check on the coupon and she can get a new coupon online as well.

## 2022-08-22 NOTE — Telephone Encounter (Signed)
Patient is following up. She states the [facility] office staff has informed her that they do not have any samples of Bystolic and she requested that I contact [employer]. Please advise.

## 2022-08-22 NOTE — Telephone Encounter (Signed)
Called the patient back to let her know that this would be a process that would have to be approved through [facility] and that we cannot call and ask for samples from a third party. She has verbalized her understanding.

## 2022-08-22 NOTE — Telephone Encounter (Signed)
Hey Lynn, LPN, can you please advise on this matter? Thanks  ?

## 2022-08-22 NOTE — Telephone Encounter (Signed)
This is a [place] pt, we do not have bystolic samples. Please address

## 2022-08-22 NOTE — Telephone Encounter (Signed)
Pt is returning call again in regards to information that she believes that may be helpful to help find samples.  She states that there is a place called [facility] and they would mail them samples if requested. She says she would like for you to reach out to them and see about getting these samples.  Ph # [phone] Mon-Fri -- 9:30 A.M. to - 5:00 P.M.   When you call they will give you a website to go to so you can set up an account to receive free samples.   Pt states she would like a call back to make sure that this can be done. Please advise.

## 2022-08-23 ENCOUNTER — Ambulatory Visit: Payer: No Typology Code available for payment source | Attending: Family Medicine

## 2022-08-23 DIAGNOSIS — R7303 Prediabetes: Secondary | ICD-10-CM

## 2022-08-23 DIAGNOSIS — I10 Essential (primary) hypertension: Secondary | ICD-10-CM

## 2022-08-24 ENCOUNTER — Other Ambulatory Visit: Payer: No Typology Code available for payment source

## 2022-08-24 LAB — HEMOGLOBIN A1C
Est. average glucose Bld gHb Est-mCnc: 123 mg/dL
Hgb A1c MFr Bld: 5.9 % — ABNORMAL HIGH (ref 4.8–5.6)

## 2022-08-24 LAB — COMPREHENSIVE METABOLIC PANEL
ALT: 22 IU/L (ref 0–32)
AST: 20 IU/L (ref 0–40)
Albumin/Globulin Ratio: 1.6 (ref 1.2–2.2)
Albumin: 4.4 g/dL (ref 3.9–4.9)
Alkaline Phosphatase: 49 IU/L (ref 44–121)
BUN/Creatinine Ratio: 21 (ref 12–28)
BUN: 16 mg/dL (ref 8–27)
Bilirubin Total: 0.7 mg/dL (ref 0.0–1.2)
CO2: 24 mmol/L (ref 20–29)
Calcium: 9.2 mg/dL (ref 8.7–10.3)
Chloride: 105 mmol/L (ref 96–106)
Creatinine, Ser: 0.78 mg/dL (ref 0.57–1.00)
Globulin, Total: 2.7 g/dL (ref 1.5–4.5)
Glucose: 92 mg/dL (ref 70–99)
Potassium: 4.2 mmol/L (ref 3.5–5.2)
Sodium: 143 mmol/L (ref 134–144)
Total Protein: 7.1 g/dL (ref 6.0–8.5)
eGFR: 85 mL/min/{1.73_m2} (ref >59)

## 2022-08-24 LAB — LIPID PANEL
Chol/HDL Ratio: 3.1 ratio (ref 0.0–4.4)
Cholesterol, Total: 181 mg/dL (ref 100–199)
HDL: 58 mg/dL (ref >39)
LDL Chol Calc (NIH): 113 mg/dL — ABNORMAL HIGH (ref 0–99)
Triglycerides: 52 mg/dL (ref 0–149)
VLDL Cholesterol Cal: 10 mg/dL (ref 5–40)

## 2022-08-24 LAB — CBC
Hematocrit: 34.4 % (ref 34.0–46.6)
Hemoglobin: 12 g/dL (ref 11.1–15.9)
MCH: 30.9 pg (ref 26.6–33.0)
MCHC: 34.9 g/dL (ref 31.5–35.7)
MCV: 89 fL (ref 79–97)
Platelets: 175 10*3/uL (ref 150–450)
RBC: 3.88 x10E6/uL (ref 3.77–5.28)
RDW: 13.7 % (ref 11.7–15.4)
WBC: 3.5 10*3/uL (ref 3.4–10.8)

## 2022-08-28 ENCOUNTER — Encounter: Payer: No Typology Code available for payment source | Admitting: Internal Medicine

## 2022-09-20 ENCOUNTER — Ambulatory Visit
Admission: RE | Admit: 2022-09-20 | Discharge: 2022-09-20 | Disposition: A | Discharge status: Home / Self Care | Payer: No Typology Code available for payment source | Source: Ambulatory Visit | Attending: Family Medicine | Admitting: Family Medicine

## 2022-09-20 DIAGNOSIS — Z1231 Encounter for screening mammogram for malignant neoplasm of breast: Secondary | ICD-10-CM

## 2022-09-22 ENCOUNTER — Other Ambulatory Visit: Payer: Self-pay | Admitting: Family Medicine

## 2022-09-22 ENCOUNTER — Other Ambulatory Visit: Payer: Self-pay | Admitting: Internal Medicine

## 2022-09-22 DIAGNOSIS — R928 Other abnormal and inconclusive findings on diagnostic imaging of breast: Secondary | ICD-10-CM

## 2022-09-25 ENCOUNTER — Other Ambulatory Visit: Payer: Self-pay | Admitting: Obstetrics and Gynecology

## 2022-09-25 DIAGNOSIS — R928 Other abnormal and inconclusive findings on diagnostic imaging of breast: Secondary | ICD-10-CM

## 2022-09-29 ENCOUNTER — Encounter: Payer: Self-pay | Admitting: Internal Medicine

## 2022-09-29 ENCOUNTER — Ambulatory Visit: Payer: No Typology Code available for payment source | Attending: Internal Medicine | Admitting: Internal Medicine

## 2022-09-29 VITALS — BP 122/73 | HR 63 | Temp 97.7°F | Ht 64.0 in | Wt 197.0 lb

## 2022-09-29 DIAGNOSIS — Z0000 Encounter for general adult medical examination without abnormal findings: Secondary | ICD-10-CM

## 2022-09-29 DIAGNOSIS — Z6833 Body mass index (BMI) 33.0-33.9, adult: Secondary | ICD-10-CM

## 2022-09-29 DIAGNOSIS — E669 Obesity, unspecified: Secondary | ICD-10-CM

## 2022-09-29 DIAGNOSIS — I10 Essential (primary) hypertension: Secondary | ICD-10-CM | POA: Diagnosis not present

## 2022-09-29 DIAGNOSIS — R413 Other amnesia: Secondary | ICD-10-CM

## 2022-09-29 DIAGNOSIS — J301 Allergic rhinitis due to pollen: Secondary | ICD-10-CM

## 2022-09-29 DIAGNOSIS — R7303 Prediabetes: Secondary | ICD-10-CM

## 2022-09-29 DIAGNOSIS — Z0001 Encounter for general adult medical examination with abnormal findings: Secondary | ICD-10-CM | POA: Diagnosis not present

## 2022-09-29 DIAGNOSIS — R928 Other abnormal and inconclusive findings on diagnostic imaging of breast: Secondary | ICD-10-CM | POA: Diagnosis not present

## 2022-09-29 DIAGNOSIS — E7800 Pure hypercholesterolemia, unspecified: Secondary | ICD-10-CM

## 2022-09-29 MED ORDER — LORATADINE 10 MG PO TABS: 10.0000 mg | ORAL_TABLET | Freq: Every day | ORAL | 11 refills | Status: DC

## 2022-09-29 MED ORDER — FLUTICASONE PROPIONATE 50 MCG/ACT NA SUSP: 1.0000 | Freq: Every day | NASAL | 6 refills | Status: DC

## 2022-09-29 NOTE — Progress Notes (Signed)
Patient ID: Maleka Contino, female    DOB: 14-Jan-1961  MRN: [id number]  CC: Annual Exam (Physical. Franchot Erichsen allergies, constant runny nose during allergy attack. Herold Harms eye drops to help with dry eyes. /Experiencing memory issues - requesting "something to help with brain fog")   Subjective: Kalinda Romaniello is a 62 y.o. female who presents for annual exam Her concerns today include:  Pt with hx of HTN, HL   HTN: She reports compliance with HCTZ 12.5 mg daily, Norvasc 5 mg daily, and Bystolic 10 mg daily.  Does well with her eating habits.  Obesity/prediabetes: She started walking 4 to 5 miles a day 3 to 4 weeks ago.  Does well with eating habits but tends to get a little hungry at nights.  She avoids sugary drinks. I went over her recent lab test with her.  A1c was 5.9.  LDL was 113.  Complaining of allergy symptoms with sinus congestion.  Reports constant rhinorrhea and lacrimation at times.  She has dry eyes and uses drops for that.  Has nasal/sinus congestion.  She has both Flonase and Astelin-fluticasone nasal spray on med list but does not have either of them.  Concern about slips in memory at times.  Finds herself wanting to say something or remember something but it would not come to memory until a few minutes later.  Her spouse has not made mention or has expressed any concerns about her memory.  She has not had any problems forgetting to leave the stove on or water running.  No problems getting lost or confused when driving.  She has 2 first cousins who have dementia.  She had recent mammogram that showed area of concern in the left breast.  She was recalled for additional images that are scheduled for 10/03/2022.  Patient states that initially she thought she felt something in the left breast but not anymore.  She has seen her gynecologist 2 days ago and she did a breast exam on her and did not feel any abnormal masses.  Patient reports having had Pap smear a year ago through  her gynecologist.  She is willing to sign release today for [place] to get records.  Due for Tdap vaccine.  She defers on getting it stating that it made her too sick in the past.  Patient Active Problem List   Diagnosis Date Noted   Gastroesophageal reflux disease 08/30/2019   Hyperlipidemia 06/26/2019   Dysesthesia 06/24/2019   Dry mouth, unspecified 06/24/2019   Vaccine counseling 06/24/2019   Obesity (BMI 30.0-34.9) 05/06/2019   Insomnia due to stress, anxiety and fear 06/06/2018   Noncompliance with diet and medication regimen 04/21/2018   Mood disorder 03/07/2018   Polyp of corpus uteri 02/08/2018   Hx of seizure disorder 11/10/2017   Migraine without aura and without status migrainosus, not intractable 11/10/2017   Prediabetes 11/10/2017   Spells of trembling 11/10/2017   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   Reactive hypoglycemia 03/25/2017   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   Obesity with body mass index 30 or greater 08/09/2016   History of adenomatous polyp of colon 08/04/2016   Seasonal and perennial allergic rhinoconjunctivitis 07/12/2016   Chronic suprapubic pain 05/30/2015   Menopausal symptom 10/25/2014   Primary insomnia 05/05/2014   Anxiety 05/05/2014   Chronic venous insufficiency 04/21/2014   Adenomatous polyp of colon 05/12/2013   Pain 03/11/2013   Essential hypertension, benign 01/28/2013   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 90 tablet 3   APPLE CIDER VINEGAR PO Take 15 mLs by mouth in the morning, at noon, and at bedtime. Takes 1 tbsp 2-3 times daily     azelastine (OPTIVAR) 0.05 % ophthalmic solution Place 1 drop into both eyes 2 (two) times daily as needed (itchy/watery eyes). 6 mL 5   Azelastine-Fluticasone 137-50 MCG/ACT SUSP Place 1 spray into the nose in the  morning and at bedtime. 23 g 5   betamethasone, augmented, (DIPROLENE) 0.05 % lotion Apply 1 application. topically 2 (two) times daily as needed.     BYSTOLIC 10 MG tablet Take 1 tablet (10 mg total) by mouth daily. 90 tablet 3   CINNAMON PO Take 1,000 mg by mouth daily.      fluticasone (FLONASE) 50 MCG/ACT nasal spray Place 1 spray into both nostrils daily. 9.9 g 6   glucose blood test strip See admin instructions.     hydrochlorothiazide (HYDRODIURIL) 12.5 MG tablet Take 1 tablet (12.5 mg total) by mouth daily. 90 tablet 3   Lancets (ONETOUCH DELICA PLUS LANCET33G) MISC SMARTSIG:1 Topical Daily     MELATONIN PO Take 1 tablet by mouth at bedtime.     Multiple Vitamins-Minerals (MULTIVITAMIN WITH MINERALS) tablet Take 1 tablet by mouth daily.     ONETOUCH VERIO test strip 1 each daily.     pantoprazole (PROTONIX) 40 MG tablet TAKE 1 TABLET BY MOUTH EVERY DAY 90 tablet 0   albuterol (VENTOLIN HFA) 108 (90 Base) MCG/ACT inhaler Inhale 1-2 puffs into the lungs every 6 (six) hours as needed for wheezing or shortness of breath. (Patient not taking: Reported on 09/29/2022) 8 g 0   cyclobenzaprine (FLEXERIL) 5 MG tablet Take 1 tablet (5 mg total) by mouth 3 (three) times daily as needed for muscle spasms. (Patient not taking: Reported on 09/29/2022) 60 tablet 1   guaiFENesin-codeine 100-10 MG/5ML syrup Take 5 mLs by mouth every 8 (eight) hours as needed for cough. (Patient not taking: Reported on 09/29/2022) 120 mL 0   meloxicam (MOBIC) 7.5 MG tablet Take 7.5 mg by mouth as needed. (Patient not taking: Reported on 09/29/2022)     No current facility-administered medications on file prior to visit.    Allergies  Allergen Reactions   Losartan Shortness Of Breath    Dizziness,  Shortness of breath    Anesthetics, Amide Nausea And Vomiting   Benadryl [Diphenhydramine Hcl (Sleep)]     Made feel paralyzed when had benadryl with a cocktail for procedure  Decadron, bendayl, regaln combo at [facility] 2014- see  care every where    Benicar [Olmesartan]     Presyncopal , light headed    Cardura [Doxazosin Mesylate] Other (See Comments)    "makes me feel terrible"   Fentanyl Hypertension    Severe htn    Losartan Potassium-Hctz     Dizziness,  Shortness of breath    Metformin And Related     "out of body experience"     Prilocaine Nausea And Vomiting    Social History   Socioeconomic History   Marital status: Married    Spouse name: Not on file   Number of children: Not on file   Years of education: Not on file   Highest education level: Not on file  Occupational History   Not on file  Tobacco Use  Smoking status: Never   Smokeless tobacco: Never  Vaping Use   Vaping Use: Never used  Substance and Sexual Activity   Alcohol use: Yes    Comment: very rare - glass red wine when that    Drug use: No   Sexual activity: Yes    Partners: Male  Other Topics Concern   Not on file  Social History Narrative   Not on file   Social Determinants of Health   Financial Resource Strain: Not on file  Food Insecurity: Not on file  Transportation Needs: Not on file  Physical Activity: Not on file  Stress: Not on file  Social Connections: Not on file  Intimate Partner Violence: Not on file    Family History  Problem Relation Age of Onset   Hypertension Mother    Diabetes Mother    Heart disease Father    Kidney disease Father    Hypertension Father    Diabetes Father    Cancer Maternal Grandfather        colon cancer   Colon cancer Maternal Grandfather        passed age 45   Colon polyps Neg Hx    Breast cancer Neg Hx    Esophageal cancer Neg Hx    Rectal cancer Neg Hx    Stomach cancer Neg Hx     Past Surgical History:  Procedure Laterality Date   COLONOSCOPY  2018   Pyrtle-MAC-suprep(exc)-HPP   DILATION AND CURETTAGE OF UTERUS  09/08/2014   with hystereoscopy   POLYPECTOMY  2018   HPP   TONSILLECTOMY     TUBAL LIGATION      ROS: Review of Systems  HENT:   Positive for congestion. Negative for dental problem, hearing loss and sore throat.   Eyes:        Had recent eye exam done at [facility].  She wears prescription glasses.  Respiratory:  Negative for chest tightness and shortness of breath.   Genitourinary:  Negative for difficulty urinating and hematuria.   Negative except as stated above  PHYSICAL EXAM: BP 122/73 (BP Location: Left Arm, Patient Position: Sitting, Cuff Size: Normal)   Pulse 63   Temp 97.7 F (36.5 C) (Oral)   Ht 5\' 4"  (1.626 m)   Wt 197 lb (89.4 kg)   LMP 12/01/2013 (Approximate)   SpO2 100%   BMI 33.81 kg/m   Wt Readings from Last 3 Encounters:  09/29/22 197 lb (89.4 kg)  05/29/22 195 lb (88.5 kg)  02/06/22 190 lb 9.6 oz (86.5 kg)    Physical Exam  General appearance - alert, well appearing, and in no distress Mental status - normal mood, behavior, speech, dress, motor activity, and thought processes Eyes - pupils equal and reactive, extraocular eye movements intact Ears -small amount of dried wax at the entrance to the left ear canal.  Otherwise both ear canal and tympanic membranes within normal limits. Nose -moderate enlargement of both nasal turbinates. Mouth - mucous membranes moist, pharynx normal without lesions Neck - supple, no significant adenopathy Lymphatics - no palpable lymphadenopathy, no hepatosplenomegaly Chest - clear to auscultation, no wheezes, rales or rhonchi, symmetric air entry Heart - normal rate, regular rhythm, normal S1, S2, no murmurs, rubs, clicks or gallops Abdomen - soft, nontender, nondistended, no masses or organomegaly Extremities - peripheral pulses normal, no pedal edema, no clubbing or cyanosis  The 10-year ASCVD risk score (Arnett DK, et al., 2019) is: 6.9%   Values used to calculate the score:  Age: 65 years     Sex: Female     Is Non-Hispanic African American: Yes     Diabetic: No     Tobacco smoker: No     Systolic Blood Pressure: 122 mmHg     Is BP  treated: Yes     HDL Cholesterol: 58 mg/dL     Total Cholesterol: 181 mg/dL      40/98/1191   47:82 AM 02/06/2022    1:40 PM 09/09/2021    9:31 AM  Depression screen PHQ 2/9  Decreased Interest 0 0 0  Down, Depressed, Hopeless 0 0 0  PHQ - 2 Score 0 0 0  Altered sleeping 1 0   Tired, decreased energy 0 0   Change in appetite 0 0   Feeling bad or failure about yourself  0 0   Trouble concentrating 0 0   Moving slowly or fidgety/restless 0 0   Suicidal thoughts 0 0   PHQ-9 Score 1 0       09/29/2022    4:13 PM  MMSE - Mini Mental State Exam  Orientation to time 5  Orientation to Place 5  Registration 3  Attention/ Calculation 5  Recall 2  Language- name 2 objects 2  Language- repeat 1  Language- follow 3 step command 3  Language- read & follow direction 1  Write a sentence 1  Copy design 1  Total score 29       Latest Ref Rng & Units 08/23/2022    9:50 AM 02/06/2022    2:13 PM 08/26/2019    3:42 PM  CMP  Glucose 70 - 99 mg/dL 92  91  96   BUN 8 - 27 mg/dL 16  13  20    Creatinine 0.57 - 1.00 mg/dL 9.56  2.13  0.86   Sodium 134 - 144 mmol/L 143  143  138   Potassium 3.5 - 5.2 mmol/L 4.2  3.8  3.5   Chloride 96 - 106 mmol/L 105  102  102   CO2 20 - 29 mmol/L 24  24  27    Calcium 8.7 - 10.3 mg/dL 9.2  9.3  9.5   Total Protein 6.0 - 8.5 g/dL 7.1     Total Bilirubin 0.0 - 1.2 mg/dL 0.7     Alkaline Phos 44 - 121 IU/L 49     AST 0 - 40 IU/L 20     ALT 0 - 32 IU/L 22      Lipid Panel     Component Value Date/Time   CHOL 181 08/23/2022 0950   TRIG 52 08/23/2022 0950   HDL 58 08/23/2022 0950   CHOLHDL 3.1 08/23/2022 0950   CHOLHDL 3 08/27/2015 0806   VLDL 11.0 08/27/2015 0806   LDLCALC 113 (H) 08/23/2022 0950   LDLDIRECT 113.0 09/16/2014 1208    CBC    Component Value Date/Time   WBC 3.5 08/23/2022 0950   WBC 3.5 (L) 08/26/2019 1542   RBC 3.88 08/23/2022 0950   RBC 4.35 08/26/2019 1542   HGB 12.0 08/23/2022 0950   HCT 34.4 08/23/2022 0950   PLT 175  08/23/2022 0950   MCV 89 08/23/2022 0950   MCV 92 09/08/2014 2059   MCH 30.9 08/23/2022 0950   MCH 30.3 08/26/2019 1542   MCHC 34.9 08/23/2022 0950   MCHC 32.3 08/26/2019 1542   RDW 13.7 08/23/2022 0950   RDW 14.3 09/08/2014 2059   LYMPHSABS 1.4 06/24/2019 0906   MONOABS 0.2 10/04/2017 1208  EOSABS 0.0 06/24/2019 0906   BASOSABS 0.0 06/24/2019 0906    ASSESSMENT AND PLAN:  1. Annual physical exam Commended her on starting an exercise routine.  Encouraged her to keep up the good works with goal of getting in about 150 minutes/week total of moderate intensity exercise.  Encourage healthy eating habits.  Advised that if she does get hungry at night consider snacking on fruit.  2. Essential hypertension At goal.  Continue medications listed above.  3. Obesity (BMI 30.0-34.9) 4. Prediabetes See #1 above.  5. Abnormal mammogram Keep upcoming appointment for additional images of the left breast.  6. Seasonal allergic rhinitis due to pollen - loratadine (CLARITIN) 10 MG tablet; Take 1 tablet (10 mg total) by mouth daily.  Dispense: 30 tablet; Refill: 11 - fluticasone (FLONASE) 50 MCG/ACT nasal spray; Place 1 spray into both nostrils daily.  Dispense: 9.9 g; Refill: 6  7. Memory change Baseline Mini-Mental status exam today was good.  We will plan to recheck again in about 6 to 12 months.    8. Pure hypercholesterolemia Discussed and encourage healthy eating habits.    Patient was given the opportunity to ask questions.  Patient verbalized understanding of the plan and was able to repeat key elements of the plan.   This documentation was completed using Paediatric nurse.  Any transcriptional errors are unintentional.  No orders of the defined types were placed in this encounter.    Requested Prescriptions    No prescriptions requested or ordered in this encounter    No follow-ups on file.  Jonah Blue, MD, FACP

## 2022-09-29 NOTE — Patient Instructions (Signed)
Preventive Care 40-64 Years Old, Female Preventive care refers to lifestyle choices and visits with your health care provider that can promote health and wellness. Preventive care visits are also called wellness exams. What can I expect for my preventive care visit? Counseling Your health care provider may ask you questions about your: Medical history, including: Past medical problems. Family medical history. Pregnancy history. Current health, including: Menstrual cycle. Method of birth control. Emotional well-being. Home life and relationship well-being. Sexual activity and sexual health. Lifestyle, including: Alcohol, nicotine or tobacco, and drug use. Access to firearms. Diet, exercise, and sleep habits. Work and work environment. Sunscreen use. Safety issues such as seatbelt and bike helmet use. Physical exam Your health care provider will check your: Height and weight. These may be used to calculate your BMI (body mass index). BMI is a measurement that tells if you are at a healthy weight. Waist circumference. This measures the distance around your waistline. This measurement also tells if you are at a healthy weight and may help predict your risk of certain diseases, such as type 2 diabetes and high blood pressure. Heart rate and blood pressure. Body temperature. Skin for abnormal spots. What immunizations do I need?  Vaccines are usually given at various ages, according to a schedule. Your health care provider will recommend vaccines for you based on your age, medical history, and lifestyle or other factors, such as travel or where you work. What tests do I need? Screening Your health care provider may recommend screening tests for certain conditions. This may include: Lipid and cholesterol levels. Diabetes screening. This is done by checking your blood sugar (glucose) after you have not eaten for a while (fasting). Pelvic exam and Pap test. Hepatitis B test. Hepatitis C  test. HIV (human immunodeficiency virus) test. STI (sexually transmitted infection) testing, if you are at risk. Lung cancer screening. Colorectal cancer screening. Mammogram. Talk with your health care provider about when you should start having regular mammograms. This may depend on whether you have a family history of breast cancer. BRCA-related cancer screening. This may be done if you have a family history of breast, ovarian, tubal, or peritoneal cancers. Bone density scan. This is done to screen for osteoporosis. Talk with your health care provider about your test results, treatment options, and if necessary, the need for more tests. Follow these instructions at home: Eating and drinking  Eat a diet that includes fresh fruits and vegetables, whole grains, lean protein, and low-fat dairy products. Take vitamin and mineral supplements as recommended by your health care provider. Do not drink alcohol if: Your health care provider tells you not to drink. You are pregnant, may be pregnant, or are planning to become pregnant. If you drink alcohol: Limit how much you have to 0-1 drink a day. Know how much alcohol is in your drink. In the [place] one drink equals one 12 oz bottle of beer (355 mL), one 5 oz glass of wine (148 mL), or one 1 oz glass of hard liquor (44 mL). Lifestyle Brush your teeth every morning and night with fluoride toothpaste. Floss one time each day. Exercise for at least 30 minutes 5 or more days each week. Do not use any products that contain nicotine or tobacco. These products include cigarettes, chewing tobacco, and vaping devices, such as e-cigarettes. If you need help quitting, ask your health care provider. Do not use drugs. If you are sexually active, practice safe sex. Use a condom or other form of protection to   prevent STIs. If you do not wish to become pregnant, use a form of birth control. If you plan to become pregnant, see your health care provider for a  prepregnancy visit. Take aspirin only as told by your health care provider. Make sure that you understand how much to take and what form to take. Work with your health care provider to find out whether it is safe and beneficial for you to take aspirin daily. Find healthy ways to manage stress, such as: Meditation, yoga, or listening to music. Journaling. Talking to a trusted person. Spending time with friends and family. Minimize exposure to UV radiation to reduce your risk of skin cancer. Safety Always wear your seat belt while driving or riding in a vehicle. Do not drive: If you have been drinking alcohol. Do not ride with someone who has been drinking. When you are tired or distracted. While texting. If you have been using any mind-altering substances or drugs. Wear a helmet and other protective equipment during sports activities. If you have firearms in your house, make sure you follow all gun safety procedures. Seek help if you have been physically or sexually abused. What's next? Visit your health care provider once a year for an annual wellness visit. Ask your health care provider how often you should have your eyes and teeth checked. Stay up to date on all vaccines. This information is not intended to replace advice given to you by your health care provider. Make sure you discuss any questions you have with your health care provider. Document Revised: 12/01/2020 Document Reviewed: 12/01/2020 Elsevier Patient Education  2023 [employer]  

## 2022-10-03 ENCOUNTER — Ambulatory Visit
Admission: RE | Admit: 2022-10-03 | Discharge: 2022-10-03 | Disposition: A | Discharge status: Home / Self Care | Payer: No Typology Code available for payment source | Source: Ambulatory Visit | Attending: Internal Medicine | Admitting: Internal Medicine

## 2022-10-03 ENCOUNTER — Ambulatory Visit: Payer: Self-pay | Admitting: *Deleted

## 2022-10-03 DIAGNOSIS — R928 Other abnormal and inconclusive findings on diagnostic imaging of breast: Secondary | ICD-10-CM

## 2022-10-03 NOTE — Telephone Encounter (Signed)
Patient still having s/s voiced that she took  a dramamine.  Patient voiced that she feel better but weak. Patient wanted an appointment with her provider . Provider not in office this week. Patient given to information for Mobile bus . Patient voiced that she would go the mobile bus on tomorrow.

## 2022-10-03 NOTE — Telephone Encounter (Signed)
Diarrhea, queasiness, and stomach rumbling   The patient called in stating she has had a loss of appetite since Sunday and she is also experiencing some queasiness, diarrhea and stomach rumbling. Please assist patient further.

## 2022-10-03 NOTE — Telephone Encounter (Signed)
Third attempt to reach patient- no answer- message to call office left

## 2022-10-04 ENCOUNTER — Telehealth: Payer: No Typology Code available for payment source | Admitting: Physician Assistant

## 2022-10-04 ENCOUNTER — Ambulatory Visit: Payer: Self-pay

## 2022-10-04 ENCOUNTER — Encounter: Payer: Self-pay | Admitting: Physician Assistant

## 2022-10-04 DIAGNOSIS — K2900 Acute gastritis without bleeding: Secondary | ICD-10-CM

## 2022-10-04 DIAGNOSIS — R928 Other abnormal and inconclusive findings on diagnostic imaging of breast: Secondary | ICD-10-CM | POA: Insufficient documentation

## 2022-10-04 MED ORDER — ONDANSETRON 8 MG PO TBDP: 8.0000 mg | ORAL_TABLET | Freq: Three times a day (TID) | ORAL | 0 refills | Status: DC | PRN

## 2022-10-04 NOTE — Telephone Encounter (Signed)
Patient had a video visit today with [facility].

## 2022-10-04 NOTE — Patient Instructions (Signed)
Nicole Gould, thank you for joining Roney Jaffe, PA-C for today's virtual visit.  While this provider is not your primary care provider (PCP), if your PCP is located in our provider database this encounter information will be shared with them immediately following your visit.    Consent: (Patient) Nicole Gould provided verbal consent for this virtual visit at the beginning of the encounter.  Current Medications:  Current Outpatient Medications:    ondansetron (ZOFRAN-ODT) 8 MG disintegrating tablet, Take 1 tablet (8 mg total) by mouth every 8 (eight) hours as needed for nausea or vomiting., Disp: 20 tablet, Rfl: 0   amLODipine (NORVASC) 5 MG tablet, Take 1 tablet (5 mg total) by mouth daily., Disp: 90 tablet, Rfl: 3   APPLE CIDER VINEGAR PO, Take 15 mLs by mouth in the morning, at noon, and at bedtime. Takes 1 tbsp 2-3 times daily, Disp: , Rfl:    azelastine (OPTIVAR) 0.05 % ophthalmic solution, Place 1 drop into both eyes 2 (two) times daily as needed (itchy/watery eyes)., Disp: 6 mL, Rfl: 5   betamethasone, augmented, (DIPROLENE) 0.05 % lotion, Apply 1 application. topically 2 (two) times daily as needed., Disp: , Rfl:    BYSTOLIC 10 MG tablet, Take 1 tablet (10 mg total) by mouth daily., Disp: 90 tablet, Rfl: 3   CINNAMON PO, Take 1,000 mg by mouth daily. , Disp: , Rfl:    fluticasone (FLONASE) 50 MCG/ACT nasal spray, Place 1 spray into both nostrils daily., Disp: 9.9 g, Rfl: 6   glucose blood test strip, See admin instructions., Disp: , Rfl:    hydrochlorothiazide (HYDRODIURIL) 12.5 MG tablet, Take 1 tablet (12.5 mg total) by mouth daily., Disp: 90 tablet, Rfl: 3   Lancets (ONETOUCH DELICA PLUS LANCET33G) MISC, SMARTSIG:1 Topical Daily, Disp: , Rfl:    loratadine (CLARITIN) 10 MG tablet, Take 1 tablet (10 mg total) by mouth daily., Disp: 30 tablet, Rfl: 11   MELATONIN PO, Take 1 tablet by mouth at bedtime., Disp: , Rfl:    Multiple Vitamins-Minerals (MULTIVITAMIN WITH  MINERALS) tablet, Take 1 tablet by mouth daily., Disp: , Rfl:    ONETOUCH VERIO test strip, 1 each daily., Disp: , Rfl:    pantoprazole (PROTONIX) 40 MG tablet, TAKE 1 TABLET BY MOUTH EVERY DAY, Disp: 90 tablet, Rfl: 0   Medications ordered in this encounter:  Meds ordered this encounter  Medications   ondansetron (ZOFRAN-ODT) 8 MG disintegrating tablet    Sig: Take 1 tablet (8 mg total) by mouth every 8 (eight) hours as needed for nausea or vomiting.    Dispense:  20 tablet    Refill:  0    Order Specific Question:   Supervising Provider    Answer:   Quentin Angst [id number]     *If you need refills on other medications prior to your next appointment, please contact your pharmacy*  Follow-Up: Call back or seek an in-person evaluation if the symptoms worsen or if the condition fails to improve as anticipated.  [facility] [phone]  Other Instructions Diarrhea, Adult Diarrhea is frequent loose and sometimes watery bowel movements. Diarrhea can make you feel weak and cause you to become dehydrated. Dehydration is a condition in which there is not enough water or other fluids in the body. Dehydration can make you tired and thirsty, cause you to have a dry mouth, and decrease how often you urinate. Diarrhea typically lasts 2-3 days. However, it can last longer if it is a sign of  something more serious. It is important to treat your diarrhea as told by your health care provider. Follow these instructions at home: Eating and drinking     Follow these recommendations as told by your health care provider: Take an oral rehydration solution (ORS). This is an over-the-counter medicine that helps return your body to its normal balance of nutrients and water. It is found at pharmacies and retail stores. Drink enough fluid to keep your urine pale yellow. Drink fluids such as water, diluted fruit juice, and low-calorie sports drinks. You can drink milk also, if desired.  Sucking on ice chips is another way to get fluids. Avoid drinking fluids that contain a lot of sugar or caffeine, such as soda, energy drinks, and regular sports drinks. Avoid alcohol. Eat bland, easy-to-digest foods in small amounts as you are able. These foods include bananas, applesauce, rice, lean meats, toast, and crackers. Avoid spicy or fatty foods.  Medicines Take over-the-counter and prescription medicines only as told by your health care provider. If you were prescribed antibiotics, take them as told by your health care provider. Do not stop using the antibiotic even if you start to feel better. General instructions  Wash your hands often using soap and water for at least 20 seconds. If soap and water are not available, use hand sanitizer. Others in the household should wash their hands as well. Hands should be washed: After using the toilet or changing a diaper. Before preparing, cooking, or serving food. While caring for a sick person or while visiting someone in a hospital. Rest at home while you recover. Take a warm bath to relieve any burning or pain from frequent diarrhea episodes. Watch your condition for any changes. Contact a health care provider if: You have a fever. Your diarrhea gets worse. You have new symptoms. You vomit every time you eat or drink. You feel light-headed, dizzy, or have a headache. You have muscle cramps. You have signs of dehydration, such as: Dark urine, very little urine, or no urine. Cracked lips. Dry mouth. Sunken eyes. Sleepiness. Weakness. You have bloody or black stools or stools that look like tar. You have severe pain, cramping, or bloating in your abdomen. Your skin feels cold and clammy. You feel confused. Get help right away if: You have chest pain or your heart is beating very quickly. You have trouble breathing or you are breathing very quickly. You feel extremely weak or you faint. These symptoms may be an emergency. Get  help right away. Call 911. Do not wait to see if the symptoms will go away. Do not drive yourself to the hospital. This information is not intended to replace advice given to you by your health care provider. Make sure you discuss any questions you have with your health care provider. Document Revised: 11/22/2021 Document Reviewed: 11/22/2021 Elsevier Patient Education  2023 [employer].

## 2022-10-04 NOTE — Telephone Encounter (Signed)
    Chief Complaint: Pt. Has diarrhea, weakness x 3 days. Went to [facility] and was told the provider "had surgery and is not here today." Pt. Asking to be worked in today. Symptoms: Above Frequency: 3 days ago Pertinent Negatives: Patient denies fever, vomiting Disposition: ED /[] Urgent Care (no appt availability in office) / Appointment(In office/virtual)/  [facility]/ Home Care/ Refused Recommended Disposition /[] [place] [facility]/  Follow-up with PCP Additional Notes: Please advise pt.   Answer Assessment - Initial Assessment Questions 1. DIARRHEA SEVERITY: "How bad is the diarrhea?" "How many more stools have you had in the past 24 hours than normal?"    - NO DIARRHEA (SCALE 0)   - MILD (SCALE 1-3): Few loose or mushy BMs; increase of 1-3 stools over normal daily number of stools; mild increase in ostomy output.   -  MODERATE (SCALE 4-7): Increase of 4-6 stools daily over normal; moderate increase in ostomy output.   -  SEVERE (SCALE 8-10; OR "WORST POSSIBLE"): Increase of 7 or more stools daily over normal; moderate increase in ostomy output; incontinence.     Moderate 2. ONSET: "When did the diarrhea begin?"      3 days ago 3. BM CONSISTENCY: "How loose or watery is the diarrhea?"      Watery 4. VOMITING: "Are you also vomiting?" If Yes, ask: "How many times in the past 24 hours?"      No 5. ABDOMEN PAIN: "Are you having any abdomen pain?" If Yes, ask: "What does it feel like?" (e.g., crampy, dull, intermittent, constant)      Cramping 6. ABDOMEN PAIN SEVERITY: If present, ask: "How bad is the pain?"  (e.g., Scale 1-10; mild, moderate, or severe)   - MILD (1-3): doesn't interfere with normal activities, abdomen soft and not tender to touch    - MODERATE (4-7): interferes with normal activities or awakens from sleep, abdomen tender to touch    - SEVERE (8-10): excruciating pain, doubled over, unable to do any normal activities       Moderate 7. ORAL  INTAKE: If vomiting, "Have you been able to drink liquids?" "How much liquids have you had in the past 24 hours?"     Yes 8. HYDRATION: "Any signs of dehydration?" (e.g., dry mouth [not just dry lips], too weak to stand, dizziness, new weight loss) "When did you last urinate?"     No 9. EXPOSURE: "Have you traveled to a foreign country recently?" "Have you been exposed to anyone with diarrhea?" "Could you have eaten any food that was spoiled?"     No 10. ANTIBIOTIC USE: "Are you taking antibiotics now or have you taken antibiotics in the past 2 months?"       No 11. OTHER SYMPTOMS: "Do you have any other symptoms?" (e.g., fever, blood in stool)       No 12. PREGNANCY: "Is there any chance you are pregnant?" "When was your last menstrual period?"       No  Protocols used: [facility]

## 2022-10-04 NOTE — Progress Notes (Signed)
Established Patient Office Visit  Subjective   Patient ID: Nicole Gould, female    DOB: 04-Feb-1961  Age: 62 y.o. MRN: [id number]  Chief Complaint  Patient presents with   Diarrhea   Virtual Visit via Video Note  I connected with [name] on 10/04/22 at 12:00 PM EDT by a video enabled telemedicine application and verified that I am speaking with the correct person using two identifiers.  Location: Patient: Car  Provider: Remote office   I discussed the limitations of evaluation and management by telemedicine and the availability of in person appointments. The patient expressed understanding and agreed to proceed.  History of Present Illness: States that she she has been experiencing episodes of diarrhea since Sunday afternoon.  States that she will experience a feeling of queasiness, epigastric pain, cramping prior to episodes, states that this is slightly resolved with the diarrhea episode.  Does endorse episode of acid reflux last night.  Endorses nighttime awakenings for diarrhea episodes.  Denies vomiting, blood in stool, fever or chills.  Denies known sick contacts, is unsure if she ate anything tainted.  Denies any new medications or any recent antibiotic use.  States that she has been using Dramamine ginger chews with some mild relief of nausea, takes Protonix on a daily basis.  States that she also did 1 episode of Pepto-Bismol which did offer some relief.  States that she is having difficulty eating and drinking, but is working on staying hydrated.     Observations/Objective: Medical history and current medications reviewed, no physical exam completed  Past Medical History:  Diagnosis Date   Allergy    Anxiety    Chicken pox    Chronic Dizziness    Colon polyps    Concussion    Frequent headaches    GERD (gastroesophageal reflux disease)    on meds   History of stress test    a. 2005 - in setting of ectopy - reportedly nl.   Hypertension    a.  03/2018 24hr ABM: Mean BP 146/75, mean HR 61; Mean awake BP/HR 150/78, 62; Mean sleep BP/HR 130/65, 58.   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   Seasonal allergies    Seizure disorder in pregnancy    Seizures    with pregnancy 1981- pre clampsia    Social History   Socioeconomic History   Marital status: Married    Spouse name: Not on file   Number of children: Not on file   Years of education: Not on file   Highest education level: Not on file  Occupational History   Not on file  Tobacco Use   Smoking status: Never   Smokeless tobacco: Never  Vaping Use   Vaping Use: Never used  Substance and Sexual Activity   Alcohol use: Yes    Comment: very rare - glass red wine when that    Drug use: No   Sexual activity: Yes    Partners: Male  Other Topics Concern   Not on file  Social History Narrative   Not on file   Social Determinants of Health   Financial Resource Strain: Not on file  Food Insecurity: Not on file  Transportation Needs: Not on file  Physical Activity: Not on file  Stress: Not on file  Social Connections: Not on file  Intimate Partner Violence: Not on file   Family History  Problem Relation Age of Onset   Hypertension Mother  Diabetes Mother    Heart disease Father    Kidney disease Father    Hypertension Father    Diabetes Father    Cancer Maternal Grandfather        colon cancer   Colon cancer Maternal Grandfather        passed age 41   Colon polyps Neg Hx    Breast cancer Neg Hx    Esophageal cancer Neg Hx    Rectal cancer Neg Hx    Stomach cancer Neg Hx    Allergies  Allergen Reactions   Losartan Shortness Of Breath    Dizziness,  Shortness of breath    Anesthetics, Amide Nausea And Vomiting   Benadryl [Diphenhydramine Hcl (Sleep)]     Made feel paralyzed when had benadryl with a cocktail for procedure  Decadron, bendayl, regaln combo at [facility]- see care every where    Benicar [Olmesartan]      Presyncopal , light headed    Cardura [Doxazosin Mesylate] Other (See Comments)    "makes me feel terrible"   Fentanyl Hypertension    Severe htn    Losartan Potassium-Hctz     Dizziness,  Shortness of breath    Metformin And Related     "out of body experience"     Prilocaine Nausea And Vomiting    Review of Systems  Constitutional:  Negative for chills and fever.  HENT: Negative.    Eyes: Negative.   Respiratory:  Negative for shortness of breath.   Cardiovascular:  Negative for chest pain.  Gastrointestinal:  Positive for abdominal pain, diarrhea and nausea. Negative for constipation and vomiting.  Genitourinary:  Negative for dysuria.  Musculoskeletal: Negative.   Skin: Negative.   Neurological: Negative.   Endo/Heme/Allergies: Negative.   Psychiatric/Behavioral: Negative.       Assessment & Plan:   Problem List Items Addressed This Visit   None Visit Diagnoses     Acute gastritis without hemorrhage, unspecified gastritis type    -  Primary   Relevant Medications   ondansetron (ZOFRAN-ODT) 8 MG disintegrating tablet        Assessment and Plan: 1. Acute gastritis without hemorrhage, unspecified gastritis type Trial Zofran.  Patient education given on supportive care.  Red flags given for prompt reevaluation. - ondansetron (ZOFRAN-ODT) 8 MG disintegrating tablet; Take 1 tablet (8 mg total) by mouth every 8 (eight) hours as needed for nausea or vomiting.  Dispense: 20 tablet; Refill: 0   Follow Up Instructions:    I discussed the assessment and treatment plan with the patient. The patient was provided an opportunity to ask questions and all were answered. The patient agreed with the plan and demonstrated an understanding of the instructions.   The patient was advised to call back or seek an in-person evaluation if the symptoms worsen or if the condition fails to improve as anticipated.  I provided 15 minutes of non-face-to-face time during this  encounter.   Return if symptoms worsen or fail to improve.    Kasandra Knudsen Mayers, PA-C

## 2022-10-05 ENCOUNTER — Telehealth: Payer: Self-pay | Admitting: Internal Medicine

## 2022-10-05 ENCOUNTER — Ambulatory Visit: Payer: Self-pay | Admitting: *Deleted

## 2022-10-05 NOTE — Telephone Encounter (Signed)
Patient stated she has had diarrhea since Sunday. Stomach pain, nauseous, she get hot then cold. She is taking medication for the nausea and it keeps her from throwing up but she still has the diarrhea    Called patient [phone] to review sx diarrhea, nausea, abdominal pain. No answer, [facility]VM[facility] [id number].

## 2022-10-05 NOTE — Telephone Encounter (Signed)
Chief Complaint: diarrhea, abdominal pain, nausea Symptoms: diarrhea x 3 today. Has decreased since Sunday. Upper abdominal pain reported cramps intermittent esp. After eating or drinking. Dizzy at times. Thirsty , weakness standing at times. Can walk, no vomiting . Nausea "sick feeling " zofran has helped. Feeling hot then cold at times. Dark stools noted yesterday and ate collard greens on Sunday stool back to normal today  Frequency: 10/01/22 after eating collard greens and felt stomach fullness suddenly  Pertinent Negatives: Patient denies fever, no  Disposition: ED /[] Urgent Care (no appt availability in office) / Appointment(In office/virtual)/  [facility]/ Home Care/ Refused Recommended Disposition /[] [place] [facility]/  Follow-up with PCP Additional Notes:   Last seen mobile bus yesterday and treated. Patient reports she needs to see PCP. Sx continue not as bad. Recommended ED if worsening sx of dizziness weakness or dry mouth patient declined and said she does have sx but not that bad.  Called patient back to recommend calling her GI specialist she has seen in the past for appt as well.        Reason for Disposition  [1] Mild diarrhea (e.g., 1-3 or more stools than normal in past 24 hours) without known cause AND [2] present >  7 days  Answer Assessment - Initial Assessment Questions 1. DIARRHEA SEVERITY: "How bad is the diarrhea?" "How many more stools have you had in the past 24 hours than normal?"    - NO DIARRHEA (SCALE 0)   - MILD (SCALE 1-3): Few loose or mushy BMs; increase of 1-3 stools over normal daily number of stools; mild increase in ostomy output.   -  MODERATE (SCALE 4-7): Increase of 4-6 stools daily over normal; moderate increase in ostomy output.   -  SEVERE (SCALE 8-10; OR "WORST POSSIBLE"): Increase of 7 or more stools daily over normal; moderate increase in ostomy output; incontinence.     3 times today but started Sunday and  decreased slowly since Sunday  2. ONSET: "When did the diarrhea begin?"      10/01/22 3. BM CONSISTENCY: "How loose or watery is the diarrhea?"      Loose now but on occasion watery  4. VOMITING: "Are you also vomiting?" If Yes, ask: "How many times in the past 24 hours?"      No  5. ABDOMEN PAIN: "Are you having any abdomen pain?" If Yes, ask: "What does it feel like?" (e.g., crampy, dull, intermittent, constant)      Upper abdomen below breasts queezy stomach rumbling. Intermittent  6. ABDOMEN PAIN SEVERITY: If present, ask: "How bad is the pain?"  (e.g., Scale 1-10; mild, moderate, or severe)   - MILD (1-3): doesn't interfere with normal activities, abdomen soft and not tender to touch    - MODERATE (4-7): interferes with normal activities or awakens from sleep, abdomen tender to touch    - SEVERE (8-10): excruciating pain, doubled over, unable to do any normal activities       Feels sick on stomach laying on floor. Taking zofran and helping  7. ORAL INTAKE: If vomiting, "Have you been able to drink liquids?" "How much liquids have you had in the past 24 hours?"     Can drink fluids today but causes stomach to hurt cramp 8. HYDRATION: "Any signs of dehydration?" (e.g., dry mouth [not just dry lips], too weak to stand, dizziness, new weight loss) "When did you last urinate?"     Thirsty ,dizzy ,weak to stand at  times able to urinate  9. EXPOSURE: "Have you traveled to a foreign country recently?" "Have you been exposed to anyone with diarrhea?" "Could you have eaten any food that was spoiled?"     na 10. ANTIBIOTIC USE: "Are you taking antibiotics now or have you taken antibiotics in the past 2 months?"       no 11. OTHER SYMPTOMS: "Do you have any other symptoms?" (e.g., fever, blood in stool)       No flu like sx does not feel sick but when eats or drinks causes diarrhea and abdominal issues  12. PREGNANCY: "Is there any chance you are pregnant?" "When was your last menstrual period?"        na  Protocols used: [facility]

## 2022-10-05 NOTE — Telephone Encounter (Signed)
Inbound call from Cassandra at [facility], stated patient has been having diarrhea for the past four days and would like an appointment for the following week. Advised soonest appointments would be in June, they would like sooner appointment for the patient. Patient was offered appointment with APP and to be put on a wait list, declined. Cassandra wanted a nurse to nurse consult, which we attempted, nurse was unavailable at the time. Offered to send a message to the nurse and have them give her a call back at the earliest convenience. [phone].

## 2022-10-05 NOTE — Telephone Encounter (Signed)
Patient seen by one of providers yesterday. Patient reports still having symptoms. Reports taking ondansetron (ZOFRAN-ODT) 8 MG disintegrating tablet  as ordered. Patient requesting appointment with her PCP.  PCP not in the office this week and no availble appointment next week. Patient see Erick Blinks MD at [facility]. Call placed to [facility] to see if appointment could be

## 2022-10-05 NOTE — Telephone Encounter (Signed)
Nicole Gould called again to request we get her seen sooner. There was a cancellation on Nicole Gould's schedule tomorrow 10-05-21 at 8:30am so I added her to that schedule.

## 2022-10-06 ENCOUNTER — Encounter: Payer: Self-pay | Admitting: Physician Assistant

## 2022-10-06 ENCOUNTER — Other Ambulatory Visit (INDEPENDENT_AMBULATORY_CARE_PROVIDER_SITE_OTHER): Payer: No Typology Code available for payment source

## 2022-10-06 ENCOUNTER — Ambulatory Visit (INDEPENDENT_AMBULATORY_CARE_PROVIDER_SITE_OTHER): Payer: No Typology Code available for payment source | Admitting: Physician Assistant

## 2022-10-06 VITALS — BP 122/68 | HR 63 | Ht 64.0 in | Wt 192.0 lb

## 2022-10-06 DIAGNOSIS — R110 Nausea: Secondary | ICD-10-CM

## 2022-10-06 DIAGNOSIS — R1084 Generalized abdominal pain: Secondary | ICD-10-CM

## 2022-10-06 DIAGNOSIS — K219 Gastro-esophageal reflux disease without esophagitis: Secondary | ICD-10-CM | POA: Diagnosis not present

## 2022-10-06 DIAGNOSIS — R197 Diarrhea, unspecified: Secondary | ICD-10-CM

## 2022-10-06 LAB — LIPASE: Lipase: 12 U/L (ref 11.0–59.0)

## 2022-10-06 LAB — CBC WITH DIFFERENTIAL/PLATELET
Basophils Absolute: 0 10*3/uL (ref 0.0–0.1)
Basophils Relative: 0.5 % (ref 0.0–3.0)
Eosinophils Absolute: 0 10*3/uL (ref 0.0–0.7)
Eosinophils Relative: 0.3 % (ref 0.0–5.0)
HCT: 41 % (ref 36.0–46.0)
Hemoglobin: 13.7 g/dL (ref 12.0–15.0)
Lymphocytes Relative: 17.7 % (ref 12.0–46.0)
Lymphs Abs: 1.2 10*3/uL (ref 0.7–4.0)
MCHC: 33.4 g/dL (ref 30.0–36.0)
MCV: 91.8 fl (ref 78.0–100.0)
Monocytes Absolute: 0.2 10*3/uL (ref 0.1–1.0)
Monocytes Relative: 3.1 % (ref 3.0–12.0)
Neutro Abs: 5.4 10*3/uL (ref 1.4–7.7)
Neutrophils Relative %: 78.4 % — ABNORMAL HIGH (ref 43.0–77.0)
Platelets: 198 10*3/uL (ref 150.0–400.0)
RBC: 4.46 Mil/uL (ref 3.87–5.11)
RDW: 14 % (ref 11.5–15.5)
WBC: 6.8 10*3/uL (ref 4.0–10.5)

## 2022-10-06 LAB — COMPREHENSIVE METABOLIC PANEL
ALT: 17 U/L (ref 0–35)
AST: 18 U/L (ref 0–37)
Albumin: 4.5 g/dL (ref 3.5–5.2)
Alkaline Phosphatase: 50 U/L (ref 39–117)
BUN: 6 mg/dL (ref 6–23)
CO2: 29 mEq/L (ref 19–32)
Calcium: 8.5 mg/dL (ref 8.4–10.5)
Chloride: 102 mEq/L (ref 96–112)
Creatinine, Ser: 0.92 mg/dL (ref 0.40–1.20)
GFR: 65.94 mL/min (ref >60.00)
Glucose, Bld: 101 mg/dL — ABNORMAL HIGH (ref 70–99)
Potassium: 3.5 mEq/L (ref 3.5–5.1)
Sodium: 137 mEq/L (ref 135–145)
Total Bilirubin: 0.8 mg/dL (ref 0.2–1.2)
Total Protein: 8 g/dL (ref 6.0–8.3)

## 2022-10-06 MED ORDER — HYOSCYAMINE SULFATE 0.125 MG SL SUBL: SUBLINGUAL_TABLET | SUBLINGUAL | 0 refills | Status: DC

## 2022-10-06 MED ORDER — PANTOPRAZOLE SODIUM 40 MG PO TBEC: 40.0000 mg | DELAYED_RELEASE_TABLET | Freq: Two times a day (BID) | ORAL | 1 refills | Status: DC

## 2022-10-06 MED ORDER — SUCRALFATE 1 G PO TABS: 1.0000 g | ORAL_TABLET | Freq: Three times a day (TID) | ORAL | 1 refills | Status: DC

## 2022-10-06 NOTE — Progress Notes (Signed)
Chief Complaint: Diarrhea, Nausea and Abdominal pain  HPI:    Nicole Gould is a 62 year old African-American female with a past medical history as listed below including GERD and multiple others, known to Dr. Rhea Belton, who presents to clinic today with an acute complaint of diarrhea, nausea and abdominal.  She was added on urgently this morning.    10/15/2019 EGD with normal esophagus and benign fundic gland polyps, biopsies showed chronic inactive gastritis.  Mild duodenal bulb erythema without ulceration or erosion.    02/01/2021 patient seen in clinic for costochondritis.    09/15/2021 colonoscopy normal.  Repeat recommended 10 years.    08/23/2022 CBC and CMP normal.    Today, the patient tells me that on Sunday, 10/01/2022 she was in the middle of eating her lunch buffet and had a lot of collard greens and a deviled egg and had eaten a little bit of a fried chicken wing when all of a sudden she just lost her appetite.  She did not get hungry until later that night and had cereal and then started with loose watery stool and generalized abdominal cramping worse in her epigastrium overnight and into Monday, tells me she would cramp and then just have watery stool all day long.  She continues with no appetite and since then symptoms have continued, just yesterday she did have a little bit of slowing of bowel movements and actually the stools were slightly more clumpy instead of just water, but she just feels "bad".  Continues with generalized abdominal cramping pain.  She has been living off of bananas applesauce, Pedialyte and Gaviscon as well as occasional Zofran.  Tells me all this is triggered her reflux as well and she has had a lot of belching regardless of her Pantoprazole 40 mg once a day.  No one else got sick that ate with her or has been around her.  No fever.    Denies blood in her stool.  Past Medical History:  Diagnosis Date   Allergy    Anxiety    Chicken pox    Chronic Dizziness     Colon polyps    Concussion    Frequent headaches    GERD (gastroesophageal reflux disease)    on meds   History of stress test    a. 2005 - in setting of ectopy - reportedly nl.   Hypertension    a. 03/2018 24hr ABM: Mean BP 146/75, mean HR 61; Mean awake BP/HR 150/78, 62; Mean sleep BP/HR 130/65, 58.   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   Seasonal allergies    Seizure disorder in pregnancy    Seizures    with pregnancy 1981- pre clampsia     Past Surgical History:  Procedure Laterality Date   COLONOSCOPY  2018   Pyrtle-MAC-suprep(exc)-HPP   DILATION AND CURETTAGE OF UTERUS  09/08/2014   with hystereoscopy   POLYPECTOMY  2018   HPP   TONSILLECTOMY     TUBAL LIGATION      Current Outpatient Medications  Medication Sig Dispense Refill   amLODipine (NORVASC) 5 MG tablet Take 1 tablet (5 mg total) by mouth daily. 90 tablet 3   APPLE CIDER VINEGAR PO Take 15 mLs by mouth in the morning, at noon, and at bedtime. Takes 1 tbsp 2-3 times daily     azelastine (OPTIVAR) 0.05 % ophthalmic solution Place 1 drop into both eyes 2 (two) times daily as needed (  itchy/watery eyes). 6 mL 5   betamethasone, augmented, (DIPROLENE) 0.05 % lotion Apply 1 application. topically 2 (two) times daily as needed.     BYSTOLIC 10 MG tablet Take 1 tablet (10 mg total) by mouth daily. 90 tablet 3   CINNAMON PO Take 1,000 mg by mouth daily.      fluticasone (FLONASE) 50 MCG/ACT nasal spray Place 1 spray into both nostrils daily. 9.9 g 6   glucose blood test strip See admin instructions.     hydrochlorothiazide (HYDRODIURIL) 12.5 MG tablet Take 1 tablet (12.5 mg total) by mouth daily. 90 tablet 3   Lancets (ONETOUCH DELICA PLUS LANCET33G) MISC SMARTSIG:1 Topical Daily     loratadine (CLARITIN) 10 MG tablet Take 1 tablet (10 mg total) by mouth daily. 30 tablet 11   MELATONIN PO Take 1 tablet by mouth at bedtime.     Multiple Vitamins-Minerals (MULTIVITAMIN WITH MINERALS)  tablet Take 1 tablet by mouth daily.     ondansetron (ZOFRAN-ODT) 8 MG disintegrating tablet Take 1 tablet (8 mg total) by mouth every 8 (eight) hours as needed for nausea or vomiting. 20 tablet 0   ONETOUCH VERIO test strip 1 each daily.     pantoprazole (PROTONIX) 40 MG tablet TAKE 1 TABLET BY MOUTH EVERY DAY 90 tablet 0   No current facility-administered medications for this visit.    Allergies as of 10/06/2022 - Review Complete 10/06/2022  Allergen Reaction Noted   Losartan Shortness Of Breath 04/30/2014   Anesthetics, amide Nausea And Vomiting 03/31/2014   Benadryl [diphenhydramine hcl (sleep)]  05/03/2016   Benicar [olmesartan]  12/10/2017   Cardura [doxazosin mesylate] Other (See Comments) 01/14/2018   Fentanyl Hypertension 05/03/2016   Losartan potassium-hctz  04/30/2014   Metformin and related  12/10/2017   Prilocaine Nausea And Vomiting 03/31/2014    Family History  Problem Relation Age of Onset   Hypertension Mother    Diabetes Mother    Heart disease Father    Kidney disease Father    Hypertension Father    Diabetes Father    Cancer Maternal Grandfather        colon cancer   Colon cancer Maternal Grandfather        passed age 79   Colon polyps Neg Hx    Breast cancer Neg Hx    Esophageal cancer Neg Hx    Rectal cancer Neg Hx    Stomach cancer Neg Hx     Social History   Socioeconomic History   Marital status: Married    Spouse name: Not on file   Number of children: Not on file   Years of education: Not on file   Highest education level: Not on file  Occupational History   Not on file  Tobacco Use   Smoking status: Never   Smokeless tobacco: Never  Vaping Use   Vaping Use: Never used  Substance and Sexual Activity   Alcohol use: Yes    Comment: very rare - glass red wine when that    Drug use: No   Sexual activity: Yes    Partners: Male  Other Topics Concern   Not on file  Social History Narrative   Not on file   Social Determinants of  Health   Financial Resource Strain: Not on file  Food Insecurity: Not on file  Transportation Needs: Not on file  Physical Activity: Not on file  Stress: Not on file  Social Connections: Not on file  Intimate Partner Violence: Not on  file    Review of Systems:    Constitutional: No weight loss, fever or chills Cardiovascular: No chest pain Respiratory: No SOB  Gastrointestinal: See HPI and otherwise negative   Physical Exam:  Vital signs: BP 122/68   Pulse 63   Ht 5\' 4"  (1.626 m)   Wt 192 lb (87.1 kg)   LMP 12/01/2013 (Approximate)   BMI 32.96 kg/m    Constitutional:  AA female appears to be in NAD, Well developed, Well nourished, alert and cooperative Respiratory: Respirations even and unlabored. Lungs clear to auscultation bilaterally.   No wheezes, crackles, or rhonchi.  Cardiovascular: Normal S1, S2. No MRG. Regular rate and rhythm. No peripheral edema, cyanosis or pallor.  Gastrointestinal:  Soft, nondistended, moderate generalized ttp, some worse in epigastrum, No rebound or guarding. Normal bowel sounds. No appreciable masses or hepatomegaly. Rectal:  Not performed.  Psychiatric: Oriented to person, place and time. Demonstrates good judgement and reason without abnormal affect or behaviors.  RELEVANT LABS AND IMAGING: CBC    Component Value Date/Time   WBC 3.5 08/23/2022 0950   WBC 3.5 (L) 08/26/2019 1542   RBC 3.88 08/23/2022 0950   RBC 4.35 08/26/2019 1542   HGB 12.0 08/23/2022 0950   HCT 34.4 08/23/2022 0950   PLT 175 08/23/2022 0950   MCV 89 08/23/2022 0950   MCV 92 09/08/2014 2059   MCH 30.9 08/23/2022 0950   MCH 30.3 08/26/2019 1542   MCHC 34.9 08/23/2022 0950   MCHC 32.3 08/26/2019 1542   RDW 13.7 08/23/2022 0950   RDW 14.3 09/08/2014 2059   LYMPHSABS 1.4 06/24/2019 0906   MONOABS 0.2 10/04/2017 1208   EOSABS 0.0 06/24/2019 0906   BASOSABS 0.0 06/24/2019 0906    CMP     Component Value Date/Time   NA 143 08/23/2022 0950   NA 143 09/08/2014  2059   K 4.2 08/23/2022 0950   K 3.8 09/08/2014 2059   CL 105 08/23/2022 0950   CL 108 09/08/2014 2059   CO2 24 08/23/2022 0950   CO2 26 09/08/2014 2059   GLUCOSE 92 08/23/2022 0950   GLUCOSE 96 08/26/2019 1542   GLUCOSE 94 09/08/2014 2059   BUN 16 08/23/2022 0950   BUN 14 09/08/2014 2059   CREATININE 0.78 08/23/2022 0950   CREATININE 0.72 09/08/2014 2059   CALCIUM 9.2 08/23/2022 0950   CALCIUM 9.4 09/08/2014 2059   PROT 7.1 08/23/2022 0950   PROT 7.1 09/08/2014 2059   ALBUMIN 4.4 08/23/2022 0950   ALBUMIN 4.1 09/08/2014 2059   AST 20 08/23/2022 0950   AST 34 09/08/2014 2059   ALT 22 08/23/2022 0950   ALT 45 09/08/2014 2059   ALKPHOS 49 08/23/2022 0950   ALKPHOS 43 09/08/2014 2059   BILITOT 0.7 08/23/2022 0950   BILITOT 0.5 09/08/2014 2059   GFRNONAA >60 08/26/2019 1542   GFRNONAA >60 09/08/2014 2059   GFRAA >60 08/26/2019 1542   GFRAA >60 09/08/2014 2059    Assessment: 1.  Diarrhea: Started with watery diarrhea about 4 days ago now with generalized abdominal cramping and nausea and decrease in appetite; likely viral gastroenteritis +/- infectious cause 2.  Generalized abdominal pain: With above 3.  GERD: Increased recently with what sounds like an acute viral gastroenteritis 4.  Nausea: With above  Plan: 1.  Discussed with patient that most likely she has a viral gastroenteritis.  Typically takes a week or 2 for this to kind of run its course, it seems like things have started to improve even  over the past 24 hours with slight decreased in frequency of her stool and that they at least have a little bit more formed. 2.  Ordered labs today including CBC, CMP and lipase as well as stool culture, O&P and C. difficile PCR, calprotectin and lactoferrin. 3.  Encouraged the patient to continue her Zofran 4 mg 1 tab every 4-6 hours as needed for nausea 4.  Prescribed Carafate 1 g 4 times daily #60 with 1 refill 5.  Increased Pantoprazole to 40 mg twice daily, 30-60 minutes before  breakfast and dinner prescribed #60 with 3 refills.  Discussed that she should only have to use this increased dosage for the next couple of weeks to a month. 6.  Prescribed Hyoscyamine sulfate 0.125 mg sublingual tabs 1 tab every 4-6 hours as needed for abdominal cramping #60 with 1 refill 7.  Patient to follow in clinic with [place] per recommendations after labs and testing above.  Hyacinth Meeker, PA-C [facility] 10/06/2022, 8:42 AM  Cc: Marcine Matar, MD

## 2022-10-06 NOTE — Patient Instructions (Addendum)
_______________________________________________________  If your blood pressure at your visit was 140/90 or greater, please contact your primary care physician to follow up on this.  If you are age 64 or younger, your body mass index should be between 19-25. Your Body mass index is 32.96 kg/m. If this is out of the aformentioned range listed, please consider follow up with your Primary Care Provider.  ________________________________________________________  Your provider has requested that you go to the basement level for lab work before leaving today. Press "B" on the elevator. The lab is located at the first door on the left as you exit the elevator.  Due to recent changes in healthcare laws, you may see the results of your imaging and laboratory studies on MyChart before your provider has had a chance to review them.  We understand that in some cases there may be results that are confusing or concerning to you. Not all laboratory results come back in the same time frame and the provider may be waiting for multiple results in order to interpret others.  Please give [place] 48 hours in order for your provider to thoroughly review all the results before contacting the office for clarification of your results.   We have sent the following medications to your pharmacy for you to pick up at your convenience:  START: Carafate four times daily  INCREASE: pantoprazole  one tablet twice daily  START: hyocyamine sulfate every 4 to 6 hours as needed  The [facility] providers would like to encourage you to use [facility] to communicate with providers for non-urgent requests or questions.  Due to long hold times on the telephone, sending your provider a message by [facility] may be a faster and more efficient way to get a response.  Please allow 48 business hours for a response.  Please remember that this is for non-urgent requests.  _______________________________________________________  Thank you for  entrusting me with your care and choosing [facility].  Hyacinth Meeker, PA-C

## 2022-10-09 ENCOUNTER — Other Ambulatory Visit: Payer: No Typology Code available for payment source

## 2022-10-09 DIAGNOSIS — K219 Gastro-esophageal reflux disease without esophagitis: Secondary | ICD-10-CM

## 2022-10-09 DIAGNOSIS — R197 Diarrhea, unspecified: Secondary | ICD-10-CM

## 2022-10-09 DIAGNOSIS — R1084 Generalized abdominal pain: Secondary | ICD-10-CM

## 2022-10-09 NOTE — Progress Notes (Signed)
Addendum: Reviewed and agree with assessment and management plan. Karys Meckley M, MD  

## 2022-10-10 LAB — OVA AND PARASITE EXAMINATION
CONCENTRATE RESULT:: NONE SEEN
MICRO NUMBER:: 14855937
SPECIMEN QUALITY:: ADEQUATE
TRICHROME RESULT:: NONE SEEN

## 2022-10-10 LAB — CLOSTRIDIUM DIFFICILE TOXIN B, QUALITATIVE, REAL-TIME PCR

## 2022-10-11 LAB — STOOL CULTURE

## 2022-10-12 LAB — STOOL CULTURE: E coli, Shiga toxin Assay: NEGATIVE

## 2022-10-13 LAB — CALPROTECTIN, FECAL: Calprotectin, Fecal: 516 ug/g — ABNORMAL HIGH (ref 0–120)

## 2022-10-16 LAB — STOOL CULTURE: E coli, Shiga toxin Assay: NEGATIVE

## 2022-11-03 ENCOUNTER — Other Ambulatory Visit: Payer: Self-pay | Admitting: Internal Medicine

## 2022-11-23 ENCOUNTER — Other Ambulatory Visit: Payer: Self-pay | Admitting: Cardiovascular Disease

## 2022-12-06 ENCOUNTER — Other Ambulatory Visit: Payer: Self-pay | Admitting: Cardiovascular Disease

## 2022-12-06 MED ORDER — BYSTOLIC 10 MG PO TABS: 10.0000 mg | ORAL_TABLET | Freq: Every day | ORAL | 0 refills | Status: DC

## 2023-01-03 ENCOUNTER — Telehealth: Payer: Self-pay | Admitting: Cardiovascular Disease

## 2023-01-03 MED ORDER — AMLODIPINE BESYLATE 5 MG PO TABS: 5.0000 mg | ORAL_TABLET | Freq: Every day | ORAL | 1 refills | Status: DC

## 2023-01-03 NOTE — Telephone Encounter (Signed)
 °*  STAT* If patient is at the pharmacy, call can be transferred to refill team.   1. Which medications need to be refilled? (please list name of each medication and dose if known) amLODipine (NORVASC) 5 MG tablet  2. Which pharmacy/location (including street and city if local pharmacy) is medication to be sent to? [id number] - [place], [place] - [address]  3. Do they need a 30 day or 90 day supply? 90 day

## 2023-01-03 NOTE — Telephone Encounter (Signed)
Further refills to be given at upcoming office visit. Requested Prescriptions   Signed Prescriptions Disp Refills   amLODipine (NORVASC) 5 MG tablet 30 tablet 1    Sig: Take 1 tablet (5 mg total) by mouth daily.    Authorizing Provider: Antonieta Iba    Ordering User: Thayer Headings, Sandar Krinke L

## 2023-01-18 ENCOUNTER — Telehealth: Payer: Self-pay | Admitting: Cardiovascular Disease

## 2023-01-18 ENCOUNTER — Other Ambulatory Visit: Payer: Self-pay | Admitting: Physician Assistant

## 2023-01-18 MED ORDER — BYSTOLIC 10 MG PO TABS: 10.0000 mg | ORAL_TABLET | Freq: Every day | ORAL | 0 refills | Status: DC

## 2023-01-18 NOTE — Telephone Encounter (Signed)
Requested Prescriptions   Signed Prescriptions Disp Refills   BYSTOLIC 10 MG tablet 90 tablet 0    Sig: Take 1 tablet (10 mg total) by mouth daily.    Authorizing Provider: Antonieta Iba    Ordering User: Kendrick Fries

## 2023-01-18 NOTE — Telephone Encounter (Signed)
*  STAT* If patient is at the pharmacy, call can be transferred to refill team.   1. Which medications need to be refilled? (please list name of each medication and dose if known) Bystolic   2. Would you like to learn more about the convenience, safety, & potential cost savings by using the [facility]?     3. Are you open to using the [facility] (Type [facility]   4. Which pharmacy/location (including street and city if local pharmacy) is medication to be sent to?[facility] [address] [place]   5. Do they need a 30 day or 90 day supply? 90 days and refills- patient said the last time she only received 30 days- she says she needs 90 days for all her refills please

## 2023-01-19 ENCOUNTER — Ambulatory Visit: Payer: Self-pay

## 2023-01-19 ENCOUNTER — Emergency Department
Admission: EM | Admit: 2023-01-19 | Discharge: 2023-01-19 | Disposition: A | Discharge status: Home / Self Care | Payer: No Typology Code available for payment source | Attending: Emergency Medicine | Admitting: Emergency Medicine

## 2023-01-19 ENCOUNTER — Emergency Department: Payer: No Typology Code available for payment source

## 2023-01-19 ENCOUNTER — Other Ambulatory Visit: Payer: Self-pay

## 2023-01-19 ENCOUNTER — Encounter: Payer: Self-pay | Admitting: Intensive Care

## 2023-01-19 DIAGNOSIS — R5383 Other fatigue: Secondary | ICD-10-CM | POA: Diagnosis not present

## 2023-01-19 DIAGNOSIS — R531 Weakness: Secondary | ICD-10-CM | POA: Diagnosis not present

## 2023-01-19 DIAGNOSIS — E119 Type 2 diabetes mellitus without complications: Secondary | ICD-10-CM | POA: Diagnosis not present

## 2023-01-19 DIAGNOSIS — I10 Essential (primary) hypertension: Secondary | ICD-10-CM | POA: Diagnosis not present

## 2023-01-19 DIAGNOSIS — R42 Dizziness and giddiness: Secondary | ICD-10-CM | POA: Diagnosis present

## 2023-01-19 LAB — CBC
HCT: 39.5 % (ref 36.0–46.0)
Hemoglobin: 12.8 g/dL (ref 12.0–15.0)
MCH: 29.8 pg (ref 26.0–34.0)
MCHC: 32.4 g/dL (ref 30.0–36.0)
MCV: 91.9 fL (ref 80.0–100.0)
Platelets: UNDETERMINED 10*3/uL (ref 150–400)
RBC: 4.3 MIL/uL (ref 3.87–5.11)
RDW: 14 % (ref 11.5–15.5)
WBC: 3.3 10*3/uL — ABNORMAL LOW (ref 4.0–10.5)
nRBC: 0 % (ref 0.0–0.2)

## 2023-01-19 LAB — URINALYSIS, ROUTINE W REFLEX MICROSCOPIC
Bilirubin Urine: NEGATIVE
Glucose, UA: NEGATIVE mg/dL
Hgb urine dipstick: NEGATIVE
Ketones, ur: NEGATIVE mg/dL
Leukocytes,Ua: NEGATIVE
Nitrite: NEGATIVE
Protein, ur: NEGATIVE mg/dL
Specific Gravity, Urine: 1.005 (ref 1.005–1.030)
pH: 7 (ref 5.0–8.0)

## 2023-01-19 LAB — BASIC METABOLIC PANEL
Anion gap: 9 (ref 5–15)
BUN: 15 mg/dL (ref 8–23)
CO2: 26 mmol/L (ref 22–32)
Calcium: 9.1 mg/dL (ref 8.9–10.3)
Chloride: 102 mmol/L (ref 98–111)
Creatinine, Ser: 0.69 mg/dL (ref 0.44–1.00)
GFR, Estimated: >60 mL/min (ref >60)
Glucose, Bld: 94 mg/dL (ref 70–99)
Potassium: 3.6 mmol/L (ref 3.5–5.1)
Sodium: 137 mmol/L (ref 135–145)

## 2023-01-19 NOTE — Telephone Encounter (Signed)
Patient evaluated in ED today.

## 2023-01-19 NOTE — ED Provider Notes (Signed)
[facility] Provider Note   Event Date/Time   First MD Initiated Contact with Patient 01/19/23 1141     (approximate) History  Weakness  HPI Nicole Gould is a 62 y.o. female with a stated past medical history of type 2 diabetes and hypertension who presents complaining of intermittent generalized weakness, fatigue, lightheadedness, and presyncopal symptoms.  Patient states that the symptoms are somewhat worse in the morning or before meals.  Patient initially thought that the symptoms were due to her diabetes and secondary hypoglycemia however patient states that her symptoms do not fully improve with eating as well as she has checked her blood sugar that has not been low despite these continued symptoms.  Patient states she also has a history of sleep apnea but she has not started a CPAP for as well as a history of abnormal heart beats but has never been told that she has any abnormal heart rhythm. ROS: Patient currently denies any vision changes, tinnitus, difficulty speaking, facial droop, sore throat, chest pain, shortness of breath, abdominal pain, nausea/vomiting/diarrhea, dysuria, or numbness/paresthesias in any extremity   Physical Exam  Triage Vital Signs: ED Triage Vitals  Encounter Vitals Group     BP 01/19/23 1110 138/64     Systolic BP Percentile --      Diastolic BP Percentile --      Pulse Rate 01/19/23 1110 60     Resp 01/19/23 1110 17     Temp 01/19/23 1110 98.4 F (36.9 C)     Temp Source 01/19/23 1110 Oral     SpO2 01/19/23 1110 100 %     Weight 01/19/23 1113 193 lb (87.5 kg)     Height 01/19/23 1113 5\' 4"  (1.626 m)     Head Circumference --      Peak Flow --      Pain Score 01/19/23 1113 0     Pain Loc --      Pain Education --      Exclude from Growth Chart --    Most recent vital signs: Vitals:   01/19/23 1200 01/19/23 1500  BP: 135/67 128/64  Pulse: (!) 55 (!) 58  Resp: 13 18  Temp:    SpO2: 100% 100%   General: Awake,  oriented x4. CV:  Good peripheral perfusion.  Resp:  Normal effort.  Abd:  No distention.  Other:  Middle-aged overweight African-American female laying in bed in no acute distress ED Results / Procedures / Treatments  Labs (all labs ordered are listed, but only abnormal results are displayed) Labs Reviewed  CBC - Abnormal; Notable for the following components:      Result Value   WBC 3.3 (*)    All other components within normal limits  URINALYSIS, ROUTINE W REFLEX MICROSCOPIC - Abnormal; Notable for the following components:   Color, Urine STRAW (*)    APPearance CLEAR (*)    All other components within normal limits  BASIC METABOLIC PANEL   EKG ED ECG REPORT I, Merwyn Katos, the attending physician, personally viewed and interpreted this ECG. Date: 01/19/2023 EKG Time: 1113 Rate: 62 Rhythm: normal sinus rhythm QRS Axis: normal Intervals: normal ST/T Wave abnormalities: normal Narrative Interpretation: no evidence of acute ischemia RADIOLOGY ED MD interpretation: CT of the head without contrast interpreted by me shows no evidence of acute abnormalities including no intracerebral hemorrhage, obvious masses, or significant edema  One-view portable chest x-ray interpreted by me shows no evidence of acute abnormalities including no  pneumonia, pneumothorax, or widened mediastinum -Agree with radiology assessment Official radiology report(s): CT Head Wo Contrast  Result Date: 01/19/2023 CLINICAL DATA:  Provided history: Neuro deficit, acute, stroke suspected. Weakness. Near syncope. Bilateral leg heaviness. Head pressure. EXAM: CT HEAD WITHOUT CONTRAST TECHNIQUE: Contiguous axial images were obtained from the base of the skull through the vertex without intravenous contrast. RADIATION DOSE REDUCTION: This exam was performed according to the departmental dose-optimization program which includes automated exposure control, adjustment of the mA and/or kV according to patient size  and/or use of iterative reconstruction technique. COMPARISON:  Brain MRI 10/09/2016. FINDINGS: Brain: Cerebral volume is normal. There is no acute intracranial hemorrhage. No demarcated cortical infarct. No extra-axial fluid collection. No evidence of an intracranial mass. No midline shift. Vascular: No hyperdense vessel.  Atherosclerotic calcifications. Skull: No calvarial fracture or aggressive osseous lesion. Sinuses/Orbits: No mass or acute finding within the imaged orbits. Minimal mucosal thickening or small mucous retention cyst within a posterior left ethmoid air cell. IMPRESSION: 1.  No evidence of an acute intracranial abnormality. 2. Minimal mucosal thickening or small mucous retention cyst within a posterior left ethmoid air cell. Electronically Signed   By: Jackey Loge D.O.   On: 01/19/2023 13:22   DG Chest 1 View  Result Date: 01/19/2023 CLINICAL DATA:  Presyncope EXAM: CHEST  1 VIEW COMPARISON:  X-ray August 2022.  CT August 2023 FINDINGS: The heart size and mediastinal contours are within normal limits. Both lungs are clear. No consolidation, pneumothorax or effusion. No edema. The visualized skeletal structures are unremarkable. Overlapping cardiac leads. IMPRESSION: No acute cardiopulmonary disease. Electronically Signed   By: Karen Kays M.D.   On: 01/19/2023 13:13   PROCEDURES: Critical Care performed: No .1-3 Lead EKG Interpretation  Performed by: Merwyn Katos, MD Authorized by: Merwyn Katos, MD     Interpretation: normal     ECG rate:  71   ECG rate assessment: normal     Rhythm: sinus rhythm     Ectopy: none     Conduction: normal    MEDICATIONS ORDERED IN ED: Medications - No data to display IMPRESSION / MDM / ASSESSMENT AND PLAN / ED COURSE  I reviewed the triage vital signs and the nursing notes.                             The patient is on the cardiac monitor to evaluate for evidence of arrhythmia and/or significant heart rate changes. Patient's presentation  is most consistent with acute presentation with potential threat to life or bodily function. Patient presents with complaints of syncope/presyncope ED Workup:  CBC, BMP, Troponin, BNP, ECG, CXR, head CT Differential diagnosis includes HF, ICH, seizure, stroke, HOCM, ACS, aortic dissection, malignant arrhythmia, or GI bleed. Findings: No evidence of acute laboratory abnormalities.  Troponin negative x1 EKG: No e/o STEMI. No evidence of Brugadas sign, delta wave, epsilon wave, significantly prolonged QTc, or malignant arrhythmia.  Disposition: Discharge. Patient is at baseline at this time. Return precautions expressed and understood in person. Advised follow up with primary care provider or clinic physician in next 24 hours. Clinical Course as of 01/19/23 1536  Fri Jan 19, 2023  1405 Weight: 87.5 kg [EB]    Clinical Course User Index [EB] Merwyn Katos, MD   FINAL CLINICAL IMPRESSION(S) / ED DIAGNOSES   Final diagnoses:  Episodic lightheadedness   Rx / DC Orders   ED Discharge Orders  None      Note:  This document was prepared using Dragon voice recognition software and may include unintentional dictation errors.   Merwyn Katos, MD 01/19/23 1537

## 2023-01-19 NOTE — ED Triage Notes (Signed)
Patient presents with weakness, near syncope, bilateral heavy legs, and head pressure. Reports she also feels unsteady on feet.

## 2023-01-19 NOTE — Discharge Instructions (Addendum)
Please decrease your bistolic to 5mg  by mouth daily. Your most recent weight was 86.5kg

## 2023-01-19 NOTE — ED Notes (Signed)
Patient transported to CT 

## 2023-01-19 NOTE — Telephone Encounter (Signed)
  Chief Complaint: Dizziness - legs feel heavy Symptoms: above Frequency: 3 weeks Pertinent Negatives: Patient denies Low blood sugar - BP issues Disposition: [] ED /[] Urgent Care (no appt availability in office) / [] Appointment(In office/virtual)/ []  [facility]/ [] Home Care/ [] Refused Recommended Disposition /[] [facility]/ []  Follow-up with PCP Additional Notes: Pt has had several episodes of dizziness over the past 3 weeks. Pt feels like she is going to fall. Even after episode resolves, pt is unsteady on her feet. Pt also feels like her legs are very heavy. She is unable to complete her exercise. No appts in office. Pt refuses UC. Pt feel that she should be able to see her pcp when needed, and needs to be worked in today.  Pt states that she is unable to come into office when she has an issue, and is upset by this.  Please work pt in.     Reason for Disposition  [1] MODERATE dizziness (e.g., interferes with normal activities) AND [2] has NOT been evaluated by doctor (or NP/PA) for this  (Exception: Dizziness caused by heat exposure, sudden standing, or poor fluid intake.)  Answer Assessment - Initial Assessment Questions 1. DESCRIPTION: "Describe your dizziness."     Lightheaded 2. LIGHTHEADED: "Do you feel lightheaded?" (e.g., somewhat faint, woozy, weak upon standing)     Lightheaded - feels like she is going to pass out. 3. VERTIGO: "Do you feel like either you or the room is spinning or tilting?" (i.e. vertigo)     No vertigo 4. SEVERITY: "How bad is it?"  "Do you feel like you are going to faint?" "Can you stand and walk?"   - MILD: Feels slightly dizzy, but walking normally.   - MODERATE: Feels unsteady when walking, but not falling; interferes with normal activities (e.g., school, work).   - SEVERE: Unable to walk without falling, or requires assistance to walk without falling; feels like passing out now.      moderate 5. ONSET:  "When did the dizziness  begin?"     2 weeks ago 6. AGGRAVATING FACTORS: "Does anything make it worse?" (e.g., standing, change in head position)      8. CAUSE: "What do you think is causing the dizziness?"     Low blood sugar 9. RECURRENT SYMPTOM: "Have you had dizziness before?" If Yes, ask: "When was the last time?" "What happened that time?"     Last 3 weeks 10. OTHER SYMPTOMS: "Do you have any other symptoms?" (e.g., fever, chest pain, vomiting, diarrhea, bleeding)       Legs feel heavy, HA - feels like head is squeezing  Protocols used: Dizziness - Lightheadedness-A-AH

## 2023-01-24 ENCOUNTER — Telehealth: Payer: Self-pay | Admitting: Cardiovascular Disease

## 2023-01-24 NOTE — Telephone Encounter (Signed)
Pt c/o medication issue:  1. Name of Medication: Wegovy  2. How are you currently taking this medication (dosage and times per day)?   3. Are you having a reaction (difficulty breathing--STAT)?   4. What is your medication issue?    Patient stated she will be starting this medication on Sunday (8/11) and is concerned this medication will cause weight loss and affect her BP.  Patient wants advice on how work with this medication.

## 2023-01-29 NOTE — Telephone Encounter (Signed)
Called and spoke with patient. Informed her of the following from Dr. Mariah Milling.  Would recommend she track her blood pressures and bring a piece of paper with numbers in when she sees cadence in the office Thx TGollan   Patient verbalized understanding.

## 2023-02-02 ENCOUNTER — Ambulatory Visit: Payer: Self-pay

## 2023-02-02 ENCOUNTER — Telehealth: Payer: Self-pay | Admitting: Internal Medicine

## 2023-02-02 NOTE — Telephone Encounter (Signed)
Spoke with patient . Verified name & DOB    Patient voiced that she had gone to the ed for Dizziness and lightness. Voiced that she is still experiencing symptoms. Would like to get an  earlier appointment. Appointment for 02/13/2023.

## 2023-02-02 NOTE — Telephone Encounter (Signed)
Summary: Dizziness Advice   Pt is calling to report that she went to the ED for Dizziness calling to get a hospital follow up. No available appts until mid Sept. Pt continues to feel dizzy and off balanced. Pt is a walker is scared to walk. Please advice     Chief Complaint: Dizziness with her morning walk. Seen in ED with this. They decreased her Bystolic. Asking to be worked in, no availability. Symptoms: Above Frequency: Mid - July Pertinent Negatives: Patient denies any other Disposition: [] ED /[] Urgent Care (no appt availability in office) / [] Appointment(In office/virtual)/ []  [facility]/ [] Home Care/ [] Refused Recommended Disposition /[] [facility]/ [x]  Follow-up with PCP Additional Notes: Please advise pt. About appointment.  Reason for Disposition  [1] MODERATE dizziness (e.g., interferes with normal activities) AND [2] has been evaluated by doctor (or NP/PA) for this  Answer Assessment - Initial Assessment Questions 1. DESCRIPTION: "Describe your dizziness."     Woozy 2. LIGHTHEADED: "Do you feel lightheaded?" (e.g., somewhat faint, woozy, weak upon standing)     Yes 3. VERTIGO: "Do you feel like either you or the room is spinning or tilting?" (i.e. vertigo)     No 4. SEVERITY: "How bad is it?"  "Do you feel like you are going to faint?" "Can you stand and walk?"   - MILD: Feels slightly dizzy, but walking normally.   - MODERATE: Feels unsteady when walking, but not falling; interferes with normal activities (e.g., school, work).   - SEVERE: Unable to walk without falling, or requires assistance to walk without falling; feels like passing out now.      Mild-moderate 5. ONSET:  "When did the dizziness begin?"     Few weeks ago 6. AGGRAVATING FACTORS: "Does anything make it worse?" (e.g., standing, change in head position)     Walking 7. HEART RATE: "Can you tell me your heart rate?" "How many beats in 15 seconds?"  (Note: not all patients can do this)        89 8. CAUSE: "What do you think is causing the dizziness?"     Unsure 9. RECURRENT SYMPTOM: "Have you had dizziness before?" If Yes, ask: "When was the last time?" "What happened that time?"     No 10. OTHER SYMPTOMS: "Do you have any other symptoms?" (e.g., fever, chest pain, vomiting, diarrhea, bleeding)       No 11. PREGNANCY: "Is there any chance you are pregnant?" "When was your last menstrual period?"       No  Protocols used: Dizziness - Lightheadedness-A-AH

## 2023-02-07 ENCOUNTER — Other Ambulatory Visit (HOSPITAL_BASED_OUTPATIENT_CLINIC_OR_DEPARTMENT_OTHER): Payer: Self-pay

## 2023-02-07 ENCOUNTER — Other Ambulatory Visit: Payer: Self-pay

## 2023-02-07 ENCOUNTER — Emergency Department (HOSPITAL_BASED_OUTPATIENT_CLINIC_OR_DEPARTMENT_OTHER): Payer: No Typology Code available for payment source | Admitting: Radiology

## 2023-02-07 ENCOUNTER — Emergency Department (HOSPITAL_BASED_OUTPATIENT_CLINIC_OR_DEPARTMENT_OTHER)
Admission: EM | Admit: 2023-02-07 | Discharge: 2023-02-07 | Disposition: A | Discharge status: Home / Self Care | Payer: No Typology Code available for payment source | Attending: Emergency Medicine | Admitting: Emergency Medicine

## 2023-02-07 ENCOUNTER — Encounter (HOSPITAL_BASED_OUTPATIENT_CLINIC_OR_DEPARTMENT_OTHER): Payer: Self-pay | Admitting: Emergency Medicine

## 2023-02-07 DIAGNOSIS — Z79899 Other long term (current) drug therapy: Secondary | ICD-10-CM | POA: Diagnosis not present

## 2023-02-07 DIAGNOSIS — I10 Essential (primary) hypertension: Secondary | ICD-10-CM | POA: Diagnosis not present

## 2023-02-07 DIAGNOSIS — E876 Hypokalemia: Secondary | ICD-10-CM | POA: Diagnosis not present

## 2023-02-07 DIAGNOSIS — T447X5A Adverse effect of beta-adrenoreceptor antagonists, initial encounter: Secondary | ICD-10-CM | POA: Diagnosis not present

## 2023-02-07 DIAGNOSIS — R42 Dizziness and giddiness: Secondary | ICD-10-CM | POA: Insufficient documentation

## 2023-02-07 DIAGNOSIS — T50905A Adverse effect of unspecified drugs, medicaments and biological substances, initial encounter: Secondary | ICD-10-CM

## 2023-02-07 DIAGNOSIS — R079 Chest pain, unspecified: Secondary | ICD-10-CM | POA: Diagnosis present

## 2023-02-07 HISTORY — DX: Obstructive sleep apnea (adult) (pediatric): G47.33

## 2023-02-07 LAB — BASIC METABOLIC PANEL
Anion gap: 8 (ref 5–15)
BUN: 12 mg/dL (ref 8–23)
CO2: 28 mmol/L (ref 22–32)
Calcium: 9.3 mg/dL (ref 8.9–10.3)
Chloride: 104 mmol/L (ref 98–111)
Creatinine, Ser: 0.7 mg/dL (ref 0.44–1.00)
GFR, Estimated: >60 mL/min (ref >60)
Glucose, Bld: 78 mg/dL (ref 70–99)
Potassium: 3.2 mmol/L — ABNORMAL LOW (ref 3.5–5.1)
Sodium: 140 mmol/L (ref 135–145)

## 2023-02-07 LAB — CBC
HCT: 40.8 % (ref 36.0–46.0)
Hemoglobin: 13.2 g/dL (ref 12.0–15.0)
MCH: 29.4 pg (ref 26.0–34.0)
MCHC: 32.4 g/dL (ref 30.0–36.0)
MCV: 90.9 fL (ref 80.0–100.0)
Platelets: 156 10*3/uL (ref 150–400)
RBC: 4.49 MIL/uL (ref 3.87–5.11)
RDW: 13.4 % (ref 11.5–15.5)
WBC: 3.2 10*3/uL — ABNORMAL LOW (ref 4.0–10.5)
nRBC: 0 % (ref 0.0–0.2)

## 2023-02-07 LAB — HEPATIC FUNCTION PANEL
ALT: 16 U/L (ref 0–44)
AST: 19 U/L (ref 15–41)
Albumin: 4.6 g/dL (ref 3.5–5.0)
Alkaline Phosphatase: 44 U/L (ref 38–126)
Bilirubin, Direct: 0.2 mg/dL (ref 0.0–0.2)
Indirect Bilirubin: 0.5 mg/dL (ref 0.3–0.9)
Total Bilirubin: 0.7 mg/dL (ref 0.3–1.2)
Total Protein: 8 g/dL (ref 6.5–8.1)

## 2023-02-07 LAB — URINALYSIS, ROUTINE W REFLEX MICROSCOPIC
Bilirubin Urine: NEGATIVE
Glucose, UA: NEGATIVE mg/dL
Hgb urine dipstick: NEGATIVE
Ketones, ur: NEGATIVE mg/dL
Leukocytes,Ua: NEGATIVE
Nitrite: NEGATIVE
Protein, ur: NEGATIVE mg/dL
Specific Gravity, Urine: 1.005 (ref 1.005–1.030)
pH: 6 (ref 5.0–8.0)

## 2023-02-07 LAB — LIPASE, BLOOD: Lipase: 10 U/L — ABNORMAL LOW (ref 11–51)

## 2023-02-07 LAB — TROPONIN I (HIGH SENSITIVITY): Troponin I (High Sensitivity): 9 ng/L (ref <18)

## 2023-02-07 MED ORDER — POTASSIUM CHLORIDE CRYS ER 20 MEQ PO TBCR
40.0000 meq | EXTENDED_RELEASE_TABLET | Freq: Once | ORAL | Status: AC
  Administered 2023-02-07: 40 meq via ORAL
  Filled 2023-02-07: qty 2

## 2023-02-07 MED ORDER — ALUM & MAG HYDROXIDE-SIMETH 200-200-20 MG/5ML PO SUSP
30.0000 mL | Freq: Once | ORAL | Status: AC
  Administered 2023-02-07: 30 mL via ORAL
  Filled 2023-02-07: qty 30

## 2023-02-07 MED ORDER — MECLIZINE HCL 25 MG PO TABS
25.0000 mg | ORAL_TABLET | Freq: Once | ORAL | Status: AC
  Administered 2023-02-07: 25 mg via ORAL
  Filled 2023-02-07: qty 1

## 2023-02-07 NOTE — ED Provider Notes (Signed)
[place] EMERGENCY DEPARTMENT AT [facility] Provider Note   CSN: [id number] Arrival date & time: 02/07/23  1309     History  Chief Complaint  Patient presents with   Chest Pain    Nicole Gould is a 62 y.o. female with past medical history significant for hypertension, anxiety, histrionic personality disorder, diabetes, obesity who presents with concern for chest pain, back pain, cramping, and some dizziness intermittently for the last 2 weeks.  Patient reports that she was having some unsteadiness/lightheadedness a few weeks ago, cut her Bystolic in half and had had some improvement since then.  Patient reports that she recently started [facility], has done to initial injections, was having some muscle aches, sharp chest/back pain, excessive belching, and urinary frequency for the last 3 days.  At time of my evaluation she denies any active chest pain but does endorse some feeling of dizziness, unsteadiness.   Chest Pain      Home Medications Prior to Admission medications   Medication Sig Start Date End Date Taking? Authorizing Provider  Biotin 1000 MCG tablet Take 1,000 mcg by mouth daily.   Yes [provider]  WEGOVY 0.25 MG/0.5ML SOAJ Inject 0.25 mg into the skin once a week. 01/22/23  Yes [provider]  amLODipine (NORVASC) 5 MG tablet Take 1 tablet (5 mg total) by mouth daily. 01/03/23   Antonieta Iba, MD  APPLE CIDER VINEGAR PO Take 15 mLs by mouth in the morning, at noon, and at bedtime. Takes 1 tbsp 2-3 times daily    [provider]  azelastine (OPTIVAR) 0.05 % ophthalmic solution Place 1 drop into both eyes 2 (two) times daily as needed (itchy/watery eyes). 11/10/19   Ellamae Sia, DO  betamethasone, augmented, (DIPROLENE) 0.05 % lotion Apply 1 application. topically 2 (two) times daily as needed. 08/18/21   [provider]  BYSTOLIC 10 MG tablet Take 1 tablet (10 mg total) by mouth daily. 01/18/23   Antonieta Iba, MD   CINNAMON PO Take 1,000 mg by mouth daily.     [provider]  fluticasone (FLONASE) 50 MCG/ACT nasal spray Place 1 spray into both nostrils daily. 09/29/22   Marcine Matar, MD  glucose blood test strip See admin instructions. 08/17/21   [provider]  hydrochlorothiazide (HYDRODIURIL) 12.5 MG tablet Take 1 tablet (12.5 mg total) by mouth daily. 12/27/21   Antonieta Iba, MD  hyoscyamine (LEVSIN SL) 0.125 MG SL tablet 1 tablet under the tongue every 4 to 6 hours as needed. 10/06/22   Unk Lightning, PA  Lancets [facility] DELICA PLUS [place]) MISC SMARTSIG:1 Topical Daily 08/26/21   [provider]  loratadine (CLARITIN) 10 MG tablet Take 1 tablet (10 mg total) by mouth daily. 09/29/22   Marcine Matar, MD  MELATONIN PO Take 1 tablet by mouth at bedtime.    [provider]  Multiple Vitamins-Minerals (MULTIVITAMIN WITH MINERALS) tablet Take 1 tablet by mouth daily.    [provider]  ondansetron (ZOFRAN-ODT) 8 MG disintegrating tablet Take 1 tablet (8 mg total) by mouth every 8 (eight) hours as needed for nausea or vomiting. 10/04/22   Mayers, Cari S, PA-C  ONETOUCH VERIO test strip 1 each daily. 08/26/21   [provider]  pantoprazole (PROTONIX) 40 MG tablet TAKE 1 TABLET BY MOUTH EVERY DAY 01/18/23   Unk Lightning, PA  sucralfate (CARAFATE) 1 g tablet Take 1 tablet (1 g total) by mouth 4 (four) times daily -  with meals and at bedtime. 10/06/22   Unk Lightning, PA      Allergies    Losartan; Anesthetics, amide; Benadryl [diphenhydramine hcl (sleep)]; Benicar [olmesartan]; Cardura [doxazosin mesylate]; Fentanyl; Losartan potassium-hctz; Metformin and related; and Prilocaine    Review of Systems   Review of Systems  Cardiovascular:  Positive for chest pain.  All other systems reviewed and are negative.   Physical Exam Updated Vital Signs BP (!) 157/71   Pulse 61   Temp 97.9 F (36.6 C) (Oral)   Resp  (!) 22   Wt 86.2 kg   LMP 12/01/2013 (Approximate)   SpO2 100%   BMI 32.61 kg/m  Physical Exam Vitals and nursing note reviewed.  Constitutional:      General: She is not in acute distress.    Appearance: Normal appearance.  HENT:     Head: Normocephalic and atraumatic.  Eyes:     General:        Right eye: No discharge.        Left eye: No discharge.  Cardiovascular:     Rate and Rhythm: Normal rate and regular rhythm.     Heart sounds: No murmur heard.    No friction rub. No gallop.  Pulmonary:     Effort: Pulmonary effort is normal.     Breath sounds: Normal breath sounds.  Chest:     Comments: No significant tenderness to palpation of the chest wall Abdominal:     General: Bowel sounds are normal.     Palpations: Abdomen is soft.     Comments: Patient with some epigastric pain on palpation.  No right upper quadrant pain, negative Murphy sign.  No rebound, rigidity, guarding throughout.  No abdominal distention or bruising.  Skin:    General: Skin is warm and dry.     Capillary Refill: Capillary refill takes less than 2 seconds.  Neurological:     Mental Status: She is alert and oriented to person, place, and time.     Comments: Moves all 4 limbs spontaneously, CN II through XII grossly intact, can ambulate without difficulty, very mild unsteadiness on Romberg, intact sensation throughout.   Psychiatric:        Mood and Affect: Mood normal.        Behavior: Behavior normal.     ED Results / Procedures / Treatments   Labs (all labs ordered are listed, but only abnormal results are displayed) Labs Reviewed  BASIC METABOLIC PANEL - Abnormal; Notable for the following components:      Result Value   Potassium 3.2 (*)    All other components within normal limits  CBC - Abnormal; Notable for the following components:   WBC 3.2 (*)    All other components within normal limits  LIPASE, BLOOD - Abnormal; Notable for the following components:   Lipase 10 (*)    All  other components within normal limits  URINALYSIS, ROUTINE W REFLEX MICROSCOPIC - Abnormal; Notable for the following components:   Color, Urine COLORLESS (*)    All other components within normal limits  HEPATIC FUNCTION PANEL  TROPONIN I (HIGH SENSITIVITY)    EKG None  Radiology DG Chest 2 View  Result Date: 02/07/2023 CLINICAL DATA:  Chest pain. Pressure and cramps in upper back for 4 days. EXAM: CHEST - 2 VIEW COMPARISON:  01/19/2023. FINDINGS: Bilateral lung fields are clear. Bilateral costophrenic angles are clear. Normal cardio-mediastinal silhouette. No acute osseous abnormalities. The soft tissues are within normal limits. IMPRESSION:  1. No active cardiopulmonary disease. Electronically Signed   By: Jules Schick M.D.   On: 02/07/2023 14:46    Procedures Procedures    Medications Ordered in ED Medications  alum & mag hydroxide-simeth (MAALOX/MYLANTA) 200-200-20 MG/5ML suspension 30 mL (30 mLs Oral Given 02/07/23 1454)  meclizine (ANTIVERT) tablet 25 mg (25 mg Oral Given 02/07/23 1453)  potassium chloride SA (KLOR-CON M) CR tablet 40 mEq (40 mEq Oral Given 02/07/23 1510)    ED Course/ Medical Decision Making/ A&P                                 Medical Decision Making Amount and/or Complexity of Data Reviewed Labs: ordered. Radiology: ordered.   This patient is a 62 y.o. female  who presents to the ED for concern of chest pain, burping, and lightheadedness.   Differential diagnoses prior to evaluation: The emergent differential diagnosis includes, but is not limited to,  ACS, AAS, PE, Mallory-Weiss, Boerhaave's, Pneumonia, acute bronchitis, asthma or COPD exacerbation, anxiety, MSK pain or traumatic injury to the chest, acid reflux versus, CVA, spinal cord injury, ACS, arrhythmia, syncope, orthostatic hypotension, sepsis, hypoglycemia, hypoxia, electrolyte disturbance, endocrine disorder, anemia, environmental exposure, polypharmacy -- suspect overall symptoms related to  new medication . This is not an exhaustive differential.   Past Medical History / Co-morbidities / Social History: hypertension, anxiety, histrionic personality disorder, diabetes, obesity  Additional history: Chart reviewed. Pertinent results include: reviewed recent ED evaluation for episodic lightheadedness, lab work unremarkable at that time  Physical Exam: Physical exam performed. The pertinent findings include: Patient with very mild unsteadiness on Romberg but normal gait, otherwise with no focal neurologic deficits.  She has no significant tenderness to palpation of the chest wall, mild epigastric tenderness to palpation.  Lab Tests/Imaging studies: I personally interpreted labs/imaging and the pertinent results include: CBC unremarkable, negative troponin x 1 in context of chest pressure ongoing for several days.  BMP notable for mild hypokalemia, testing 3.1, we will orally replete, Badik function panel unremarkable, normal lipase.  I agree with the radiologist interpretation.  Cardiac monitoring: EKG obtained and interpreted by myself and attending physician which shows: NSR, no significant change from baseline   Medications: I ordered medication including potassium, Maalox Mylanta for suspected gas pain, Antivert for intermittent dizziness.  I have reviewed the patients home medicines and have made adjustments as needed.   Disposition: After consideration of the diagnostic results and the patients response to treatment, I feel that you overall suspect some gas pain, burping related to her Wegovy, no evidence of acute stroke, or other evident cause of her intermittent dizziness today.  She does not appear to be orthostatic, she does not have any signs of peripheral vertigo, nystagmus, discussed with patient that I have overall low suspicion for posterior circulation stroke given her intermittent and elevated acute on chronic symptoms, but do recommend PCP, cardiology evaluation,  possible neuro referral if no cause for dizziness is identified. Stable and ambulating appropriately at dischage  emergency department workup does not suggest an emergent condition requiring admission or immediate intervention beyond what has been performed at this time. The plan is: as above. The patient is safe for discharge and has been instructed to return immediately for worsening symptoms, change in symptoms or any other concerns.  Final Clinical Impression(s) / ED Diagnoses Final diagnoses:  Lightheadedness  Chest pain, unspecified type  Adverse effect of drug, initial encounter  Rx / DC Orders ED Discharge Orders     None         [place] 02/07/23 1606    Arby Barrette, MD 02/16/23 [phone]

## 2023-02-07 NOTE — Discharge Instructions (Addendum)
As we discussed you had no evidence of a heart attack or other ischemic heart disease in the emergency department today.  The only significant abnormal lab was your potassium which was a tad low, 3.2 with lower end of normal at 3.5.  I have given you some potassium today, which should return towards a normal level, recommend that you recheck this value in 2 to 4 weeks with your primary care doctor.  Low potassium can sometimes be a symptom of the hydrochlorothiazide that you are taking, you may want to talk to your doctor about whether or not you should be taking daily potassium supplementation if it does remain low.  Additionally I saw no evidence of pancreatitis, liver or gallbladder dysfunction, I am suspicious that your chest pressure and other symptoms are related to the medication, and gas pressure in the chest.  You can use over-the-counter Maalox, Mylanta, and refer to the food choices for acid reflux handout that I have attached.  It is unclear what is causing your intermittent lightheadedness for the last few weeks, I do not see evidence of an acute stroke today, and recommend that you follow-up with your primary care doctor and the cardiologist as planned.  It is not my suspicion based on your physical exam, but if you continue to have significant lightheadedness, dizziness despite all other evaluation you may want to talk to your primary care doctor about a formal neurologic evaluation, occasionally rare kind of stroke affecting only 1 part of the brain can cause dizziness as its only symptom.

## 2023-02-07 NOTE — ED Triage Notes (Signed)
Pt bib wheelchair with c/o Chest pressure radiating from back, endorse as cramping x 4 days. Taking wygovy. Symptoms Started after second injection. Also reports dizziness. Speech clear, aox4

## 2023-02-11 NOTE — Progress Notes (Unsigned)
   Acute Office Visit  Subjective:     Patient ID: Nicole Gould, female    DOB: 1960/10/14, 62 y.o.   MRN: [id number]  No chief complaint on file.   HPI Patient is in today for vertigo ED 02/07/23 Nicole Gould is a 62 y.o. female with past medical history significant for hypertension, anxiety, histrionic personality disorder, diabetes, obesity who presents with concern for chest pain, back pain, cramping, and some dizziness intermittently for the last 2 weeks.  Patient reports that she was having some unsteadiness/lightheadedness a few weeks ago, cut her Bystolic in half and had had some improvement since then.  Patient reports that she recently started [facility], has done to initial injections, was having some muscle aches, sharp chest/back pain, excessive belching, and urinary frequency for the last 3 days.  At time of my evaluation she denies any active chest pain but does endorse some feeling of dizziness, unsteadiness.  After consideration of the diagnostic results and the patients response to treatment, I feel that you overall suspect some gas pain, burping related to her Wegovy, no evidence of acute stroke, or other evident cause of her intermittent dizziness today.  She does not appear to be orthostatic, she does not have any signs of peripheral vertigo, nystagmus, discussed with patient that I have overall low suspicion for posterior circulation stroke given her intermittent and elevated acute on chronic symptoms, but do recommend PCP, cardiology evaluation, possible neuro referral if no cause for dizziness is identified. Stable and ambulating appropriately at dischage   ROS      Objective:    LMP 12/01/2013 (Approximate)  {Vitals History (Optional):23777}  Physical Exam  No results found for any visits on 02/13/23.      Assessment & Plan:   Problem List Items Addressed This Visit   None   No orders of the defined types were placed in this encounter.   No  follow-ups on file.  Shan Levans, MD

## 2023-02-12 ENCOUNTER — Ambulatory Visit: Payer: No Typology Code available for payment source | Attending: Medical

## 2023-02-12 ENCOUNTER — Encounter: Payer: Self-pay | Admitting: Medical

## 2023-02-12 ENCOUNTER — Ambulatory Visit: Payer: No Typology Code available for payment source | Attending: Medical | Admitting: Medical

## 2023-02-12 ENCOUNTER — Other Ambulatory Visit
Admission: RE | Admit: 2023-02-12 | Discharge: 2023-02-12 | Disposition: A | Discharge status: Home / Self Care | Payer: No Typology Code available for payment source | Source: Ambulatory Visit | Attending: Medical | Admitting: Medical

## 2023-02-12 VITALS — BP 119/71 | HR 65 | Ht 64.0 in | Wt 196.6 lb

## 2023-02-12 DIAGNOSIS — R55 Syncope and collapse: Secondary | ICD-10-CM

## 2023-02-12 DIAGNOSIS — E876 Hypokalemia: Secondary | ICD-10-CM | POA: Insufficient documentation

## 2023-02-12 DIAGNOSIS — R42 Dizziness and giddiness: Secondary | ICD-10-CM | POA: Diagnosis present

## 2023-02-12 DIAGNOSIS — I10 Essential (primary) hypertension: Secondary | ICD-10-CM | POA: Diagnosis not present

## 2023-02-12 DIAGNOSIS — R079 Chest pain, unspecified: Secondary | ICD-10-CM

## 2023-02-12 LAB — BASIC METABOLIC PANEL
Anion gap: 10 (ref 5–15)
BUN: 15 mg/dL (ref 8–23)
CO2: 27 mmol/L (ref 22–32)
Calcium: 9.1 mg/dL (ref 8.9–10.3)
Chloride: 102 mmol/L (ref 98–111)
Creatinine, Ser: 0.75 mg/dL (ref 0.44–1.00)
GFR, Estimated: >60 mL/min (ref >60)
Glucose, Bld: 97 mg/dL (ref 70–99)
Potassium: 3.3 mmol/L — ABNORMAL LOW (ref 3.5–5.1)
Sodium: 139 mmol/L (ref 135–145)

## 2023-02-12 LAB — TSH: TSH: 1.31 u[IU]/mL (ref 0.350–4.500)

## 2023-02-12 LAB — MAGNESIUM: Magnesium: 2.4 mg/dL (ref 1.7–2.4)

## 2023-02-12 MED ORDER — NEBIVOLOL HCL 5 MG PO TABS: 5.0000 mg | ORAL_TABLET | Freq: Two times a day (BID) | ORAL | 3 refills | Status: DC

## 2023-02-12 NOTE — Patient Instructions (Signed)
Medication Instructions:  Your physician recommends the following medication changes.  STOP TAKING: Amlodipine  DECREASE: Bystolic to 5 mg by mouth twice a day   *If you need a refill on your cardiac medications before your next appointment, please call your pharmacy*   Lab Work: Your provider would like for you to have following labs drawn today (TSH, Mg, BMP).     Testing/Procedures: Your physician has requested that you have an echocardiogram. Echocardiography is a painless test that uses sound waves to create images of your heart. It provides your doctor with information about the size and shape of your heart and how well your heart's chambers and valves are working.   You may receive an ultrasound enhancing agent through an IV if needed to better visualize your heart during the echo. This procedure takes approximately one hour.  There are no restrictions for this procedure.  This will take place at [address], [place] [address]   Your physician has recommended that you wear a 14 day Zio monitor.   This monitor is a medical device that records the heart's electrical activity. Doctors most often use these monitors to diagnose arrhythmias. Arrhythmias are problems with the speed or rhythm of the heartbeat. The monitor is a small device applied to your chest. You can wear one while you do your normal daily activities. While wearing this monitor if you have any symptoms to push the button and record what you felt. Once you have worn this monitor for the period of time provider prescribed (Usually 14 days), you will return the monitor device in the postage paid box. Once it is returned they will download the data collected and provide [place] with a report which the provider will then review and we will call you with those results. Important tips:  Avoid showering during the first 24 hours of wearing the monitor. Avoid excessive sweating to help maximize  wear time. Do not submerge the device, no hot tubs, and no swimming pools. Keep any lotions or oils away from the patch. After 24 hours you may shower with the patch on. Take brief showers with your back facing the shower head.  Do not remove patch once it has been placed because that will interrupt data and decrease adhesive wear time. Push the button when you have any symptoms and write down what you were feeling. Once you have completed wearing your monitor, remove and place into box which has postage paid and place in your outgoing mailbox.  If for some reason you have misplaced your box then call our office and we can provide another box and/or mail it off for you.      Follow-Up: At [facility], you and your health needs are our priority.  As part of our continuing mission to provide you with exceptional heart care, we have created designated Provider Care Teams.  These Care Teams include your primary Cardiologist (physician) and Advanced Practice Providers (APPs -  Physician Assistants and Nurse Practitioners) who all work together to provide you with the care you need, when you need it.  We recommend signing up for the patient portal called "MyChart".  Sign up information is provided on this After Visit Summary.  MyChart is used to connect with patients for Virtual Visits (Telemedicine).  Patients are able to view lab/test results, encounter notes, upcoming appointments, etc.  Non-urgent messages can be sent to your provider as well.   To learn more about what  you can do with MyChart, go to [email or web address].    Your next appointment:   6 week(s)  Provider:   Julien Nordmann, MD

## 2023-02-12 NOTE — Progress Notes (Signed)
Cardiology Office Note:    Date:  02/12/2023   ID:  Nicole Gould, DOB February 12, 1961, MRN [id number]  PCP:  Marcine Matar, MD  [facility] Cardiologist:  Julien Nordmann, MD  [facility] Electrophysiologist:  None   Referring MD: Marcine Matar, MD   Chief Complaint: 1 year follow-up  History of Present Illness:    Nicole Gould is a 62 y.o. female with a hx of HTN, hypoglycemia, various medication intolerances, histrionic personality disorder, calcium score on 07/03/19 which was 0 who presents with ER follow-up.   Coronary calcium score in 2021 was 0.  Patient was last seen in July 2023 for preop exam.  She was acceptable risk for exam with no further workup.  ER visit 01/19/23 for pre-syncope. Vitals were stable. EKG showed NSR with a heart rate of 62bpm. CT head non-acute.  The patient was seen in the ER 02/07/23 for chest pain. BP was mildly elevated. K 3.2. EKG was stable. The patient was discharged home.  Today, the patient reports she went to the ER because she felt like she was going to faint. Symptoms started in July. She was walking 3-4 miles outside. She would drink water before she went to go walk. She cut bystolic in half and symptoms improved. She is taking Amlodipine 5mg , bystolic 5mg  and hydrochlorothiazide 12.5mg  daily. BP at home has been fairly normal. Dizziness is overall better, but still present  Then the patient was in the ER for chest pain she felt the chest pain was due to taking [place]. She has since stopped this. She felt it was similar to costochondritis. Pain is getting better.   Past Medical History:  Diagnosis Date   Allergy    Anxiety    Chicken pox    Chronic Dizziness    Colon polyps    Concussion    Frequent headaches    GERD (gastroesophageal reflux disease)    on meds   History of stress test    a. 2005 - in setting of ectopy - reportedly nl.   Hypertension    a. 03/2018 24hr ABM: Mean BP 146/75, mean HR 61; Mean awake  BP/HR 150/78, 62; Mean sleep BP/HR 130/65, 58.   Migraines    OSA (obstructive sleep apnea)    No Cpap at this time   Pre-diabetes    Pre-eclampsia    with grand mal seizures   Seasonal allergies    Seizure disorder in pregnancy (HCC)    Seizures (HCC)    with pregnancy 1981- pre clampsia     Past Surgical History:  Procedure Laterality Date   COLONOSCOPY  2018   Pyrtle-MAC-suprep(exc)-HPP   DILATION AND CURETTAGE OF UTERUS  09/08/2014   with hystereoscopy   POLYPECTOMY  2018   HPP   TONSILLECTOMY     TUBAL LIGATION      Current Medications: Current Meds  Medication Sig   Biotin 1000 MCG tablet Take 1,000 mcg by mouth daily.   CINNAMON PO Take 1,000 mg by mouth daily.    hydrochlorothiazide (HYDRODIURIL) 12.5 MG tablet Take 1 tablet (12.5 mg total) by mouth daily.   nebivolol (BYSTOLIC) 5 MG tablet Take 1 tablet (5 mg total) by mouth 2 (two) times daily.   [DISCONTINUED] amLODipine (NORVASC) 5 MG tablet Take 1 tablet (5 mg total) by mouth daily.   [DISCONTINUED] BYSTOLIC 10 MG tablet Take 1 tablet (10 mg total) by mouth daily.     Allergies:   Losartan; Anesthetics, amide; Benadryl [diphenhydramine hcl (  sleep)]; Benicar [olmesartan]; Cardura [doxazosin mesylate]; Fentanyl; Losartan potassium-hctz; Metformin and related; and Prilocaine   Social History   Socioeconomic History   Marital status: Married    Spouse name: Not on file   Number of children: Not on file   Years of education: Not on file   Highest education level: Not on file  Occupational History   Not on file  Tobacco Use   Smoking status: Never   Smokeless tobacco: Never  Vaping Use   Vaping status: Never Used  Substance and Sexual Activity   Alcohol use: Yes    Comment: very rare - glass red wine when that    Drug use: No   Sexual activity: Yes    Partners: Male  Other Topics Concern   Not on file  Social History Narrative   Not on file   Social Determinants of Health   Financial Resource  Strain: Not on file  Food Insecurity: Not on file  Transportation Needs: Not on file  Physical Activity: Not on file  Stress: Not on file  Social Connections: Not on file     Family History: The patient's family history includes Cancer in her maternal grandfather; Colon cancer in her maternal grandfather; Diabetes in her father and mother; Heart disease in her father; Hypertension in her father and mother; Kidney disease in her father. There is no history of Colon polyps, Breast cancer, Esophageal cancer, Rectal cancer, or Stomach cancer.  ROS:   Please see the history of present illness.     All other systems reviewed and are negative.  EKGs/Labs/Other Studies Reviewed:    The following studies were reviewed today:  Cardiac scoring 2021 IMPRESSION: Coronary calcium score of 0. This was 0 percentile for age and sex matched control.   Nicole Gould    EKG:  EKG is not ordered today.    Recent Labs: 02/07/2023: ALT 16; BUN 12; Creatinine, Ser 0.70; Hemoglobin 13.2; Platelets 156; Potassium 3.2; Sodium 140  Recent Lipid Panel    Component Value Date/Time   CHOL 181 08/23/2022 0950   TRIG 52 08/23/2022 0950   HDL 58 08/23/2022 0950   CHOLHDL 3.1 08/23/2022 0950   CHOLHDL 3 08/27/2015 0806   VLDL 11.0 08/27/2015 0806   LDLCALC 113 (H) 08/23/2022 0950   LDLDIRECT 113.0 09/16/2014 1208     Physical Exam:    VS:  BP 119/71 (BP Location: Left Arm, Patient Position: Sitting, Cuff Size: Large)   Pulse 65   Ht 5\' 4"  (1.626 m)   Wt 196 lb 9.6 oz (89.2 kg)   LMP 12/01/2013 (Approximate)   SpO2 97%   BMI 33.75 kg/m     Wt Readings from Last 3 Encounters:  02/12/23 196 lb 9.6 oz (89.2 kg)  02/07/23 190 lb (86.2 kg)  01/19/23 193 lb (87.5 kg)     GEN:  Well nourished, well developed in no acute distress HEENT: Normal NECK: No JVD; No carotid bruits LYMPHATICS: No lymphadenopathy CARDIAC: RRR, no murmurs, rubs, gallops RESPIRATORY:  Clear to auscultation without  rales, wheezing or rhonchi  ABDOMEN: Soft, non-tender, non-distended MUSCULOSKELETAL:  No edema; No deformity  SKIN: Warm and dry NEUROLOGIC:  Alert and oriented x 3 PSYCHIATRIC:  Normal affect   ASSESSMENT:    1. Dizziness   2. Pre-syncope   3. Essential hypertension   4. Hypokalemia   5. Chest pain of uncertain etiology    PLAN:    In order of problems listed above:  Pre-syncope/dizziness Orthostasis HTN  Patient has history of chronic dizziness dizziness after starting blood pressure medications.  She was seen in the ER earlier this month for dizziness/presyncope.  Workup showed hypokalemia, otherwise normal.  Patient decreased Bystolic to 5 mg daily, which has seemed to help her symptoms.  She is also taking amlodipine 5 mg daily and hydrochlorothiazide 12.5 mg daily.  She did start [facility], however symptoms predated this medication.  Patient reports dizziness/presyncope is overall better, however still present.  Orthostatics today are negative.  I will stop amlodipine and change Bystolic to 5 mg twice a day.  Continue hydrochlorothiazide.  I will check a 2-week heart monitor and an echocardiogram for completeness. I will also check TSH, Mag, and BMET.  Hypokalemia Recheck BMET.  Patient is on hydrochlorothiazide.  Chest pain Patient was seen in the ER earlier this month with chest pain.  Workup was normal.  Cardiac score in 2021 was 0, which is overall very reassuring.  Patient says chest pain started after she started [facility].  She has since stopped Wegovy and chest pain has improved.  She felt chest pain is similar to costochondritis.  Will continue to monitor symptoms.   Disposition: Follow up in 1 month(s) with MD    Signed, Siddh Vandeventer David Stall, PA-C  02/12/2023 4:43 PM    [facility]

## 2023-02-13 ENCOUNTER — Encounter: Payer: Self-pay | Admitting: Critical Care Medicine

## 2023-02-13 ENCOUNTER — Ambulatory Visit
Payer: No Typology Code available for payment source | Attending: Critical Care Medicine | Admitting: Critical Care Medicine

## 2023-02-13 VITALS — BP 128/77 | HR 68 | Wt 196.6 lb

## 2023-02-13 DIAGNOSIS — G44309 Post-traumatic headache, unspecified, not intractable: Secondary | ICD-10-CM

## 2023-02-13 DIAGNOSIS — G479 Sleep disorder, unspecified: Secondary | ICD-10-CM

## 2023-02-13 DIAGNOSIS — R419 Unspecified symptoms and signs involving cognitive functions and awareness: Secondary | ICD-10-CM

## 2023-02-13 DIAGNOSIS — R42 Dizziness and giddiness: Secondary | ICD-10-CM | POA: Diagnosis not present

## 2023-02-13 DIAGNOSIS — I10 Essential (primary) hypertension: Secondary | ICD-10-CM

## 2023-02-13 DIAGNOSIS — S0990XS Unspecified injury of head, sequela: Secondary | ICD-10-CM

## 2023-02-13 DIAGNOSIS — E876 Hypokalemia: Secondary | ICD-10-CM | POA: Diagnosis not present

## 2023-02-13 DIAGNOSIS — F0781 Postconcussional syndrome: Secondary | ICD-10-CM

## 2023-02-13 MED ORDER — POTASSIUM CHLORIDE CRYS ER 20 MEQ PO TBCR: 20.0000 meq | EXTENDED_RELEASE_TABLET | Freq: Every day | ORAL | 0 refills | Status: DC

## 2023-02-13 NOTE — Assessment & Plan Note (Signed)
Concern previous head trauma and migraine syndrome is contributing to her dizziness will refer back to neurology at [facility]

## 2023-02-13 NOTE — Assessment & Plan Note (Addendum)
Stop hydrochlorothiazide and begin potassium for 5 days Come back toward her for potassium recheck

## 2023-02-13 NOTE — Assessment & Plan Note (Signed)
Encouraged patient to start CPAP

## 2023-02-13 NOTE — Assessment & Plan Note (Signed)
Stop hydrochlorothiazide issues contributing to hypokalemia give potassium replacement continue with Bystolic she back short-term

## 2023-02-13 NOTE — Assessment & Plan Note (Signed)
As per other assessments refer back to patient's neurologist

## 2023-02-13 NOTE — Patient Instructions (Addendum)
Stop hydrochlorothiazide fluid blood pressure pill Return Dr Laural Benes 6 week Referral back to your [facility] neurologist Get you sleep cpap added Start potassium supplement  Stay off Wegovy You may need to go back to amlodipine at lower dose

## 2023-02-15 ENCOUNTER — Other Ambulatory Visit: Payer: Self-pay | Admitting: Physician Assistant

## 2023-02-21 ENCOUNTER — Ambulatory Visit: Payer: Self-pay | Admitting: *Deleted

## 2023-02-21 NOTE — Telephone Encounter (Signed)
Spoke with patient. Patient voiced that she is still having dizziness. Patient voiced that she has an appointment for the Neurologist on tomorrow . Advised the patient to attend the appointment with the neurologist on tomorrow and if needed we will schedule with PCP after that appointment. Patient voiced understanding of all discussed .

## 2023-02-21 NOTE — Telephone Encounter (Signed)
Reason for Disposition  [1] MODERATE dizziness (e.g., interferes with normal activities) AND [2] has been evaluated by doctor (or NP/PA) for this  Answer Assessment - Initial Assessment Questions 1. DESCRIPTION: "Describe your dizziness."     I'm having terrible dizziness.   I saw Dr. Delford Field on 02/13/2023 for this.    I started taking the potassium he prescribed.   2. LIGHTHEADED: "Do you feel lightheaded?" (e.g., somewhat faint, woozy, weak upon standing)     Tues. Morning I woke up sit on side of the bed and when I sat up I felt dizzy.   I grabbed onto stuff.   I stood up and I was staggering trying to get to the bathroom.    I made it but I had to hold onto things.   I came back to bed.   I've never felt that bad before. I usually get dizzy with walking.   This was different.   I got up again from bed and it happened again and this morning it happened again.   If I bend over when I stand back up I'm real dizzy. I don't feel like I'm going to faint just real off balance.   Later in the day I'm better but I'm not as dizzy.   I feel foggy in my head.    3. VERTIGO: "Do you feel like either you or the room is spinning or tilting?" (i.e. vertigo)     I'm just really dizzy and off balance. 4. SEVERITY: "How bad is it?"  "Do you feel like you are going to faint?" "Can you stand and walk?"   - MILD: Feels slightly dizzy, but walking normally.   - MODERATE: Feels unsteady when walking, but not falling; interferes with normal activities (e.g., school, work).   - SEVERE: Unable to walk without falling, or requires assistance to walk without falling; feels like passing out now.      It is bad or is worse than it was when I saw Dr. Delford Field. 5. ONSET:  "When did the dizziness begin?"     Tues. Morning. 6. AGGRAVATING FACTORS: "Does anything make it worse?" (e.g., standing, change in head position)     Moving and standing up.   Bending over too. 7. HEART RATE: "Can you tell me your heart rate?" "How many  beats in 15 seconds?"  (Note: not all patients can do this)       Not asked 8. CAUSE: "What do you think is causing the dizziness?"     I don't know.   Thought maybe it was my BP medicine and Dr. Delford Field changed it a little.   My potassium was low.    I've been seen in the ED and my heart is not the problem. 9. RECURRENT SYMPTOM: "Have you had dizziness before?" If Yes, ask: "When was the last time?" "What happened that time?"     Yes when saw Dr. Delford Field on 02/13/2023 10. OTHER SYMPTOMS: "Do you have any other symptoms?" (e.g., fever, chest pain, vomiting, diarrhea, bleeding)       I've been having headaches with sharp pains on one side of my head.   I had a head injury in 2013 while I was at work.   Hit in head with a chair and had a concussion.    I recovered from that. Over the last week or so I'm having migraine symptoms.   Sharp pains in my temple area and back of my head. I have a  neurologist appt coming up 11. PREGNANCY: "Is there any chance you are pregnant?" "When was your last menstrual period?"       N/A due to age  Protocols used: Dizziness - Lightheadedness-A-AH  Chief Complaint: Dizziness getting worse.   Saw Dr. Delford Field for this in August. Symptoms:   dizziness getting worse, having headaches with sharp pains in her temples at times. Frequency: Started Tues. Morning getting worse Pertinent Negatives: Patient denies fainting or chest pain. Disposition: [] ED /[] Urgent Care (no appt availability in office) / [x] Appointment(In office/virtual)/ []  [facility]/ [] Home Care/ [] Refused Recommended Disposition /[] [place] [facility]/ [x]  Follow-up with PCP Additional Notes: She prefers to see Dr. Delford Field since that is who she has been seeing regarding this dizziness. He doesn't have any appts for several months.    Pt. Is agreeable to seeing if he can work her in.    Message sent to see if Dr. Delford Field can work her in.  I went over symptoms to go on to the ED, headaches become  worse, dizziness is worse, she passes out.

## 2023-02-21 NOTE — Telephone Encounter (Signed)
Summary: dizziness   Patient called and stated she saw Dr Shan Levans on 02/13/23. Dr Delford Field told her that her potassium was low & prescribed her some potassium supplements, she started the potassium this past Sunday, 01/18/23, and she states her dizziness has got bad, even worse than before she started the potassium supplements. She wonders if this could be a side effect of the medication? Patient states as soon as she lifts head of a morning, she feels dizzy and has to sit up for a while before getting out of bed, even after getting up she states she staggers some & when she bends over, to pick something up, she feels like she is going to fall into something, states she has to hold onto something. Please advise. She states she just feels unbalanced. Patient stated nurse Elonda Husky knows all about it & wants her to know about it.  Patients callback # [phone]

## 2023-02-22 DIAGNOSIS — R42 Dizziness and giddiness: Secondary | ICD-10-CM

## 2023-02-22 DIAGNOSIS — R55 Syncope and collapse: Secondary | ICD-10-CM

## 2023-02-23 NOTE — Telephone Encounter (Signed)
Copied from CRM [id number]. Topic: General - Other >> Feb 23, 2023 11:04 AM Macon Large wrote: Reason for CRM: Pt requests that Cassandra return her call at [phone]

## 2023-02-26 ENCOUNTER — Telehealth: Payer: Self-pay

## 2023-02-26 NOTE — Telephone Encounter (Signed)
Copied from CRM [id number]. Topic: General - Inquiry >> Feb 26, 2023 11:41 AM Lennox Pippins wrote: Patient has called back, requesting Harvie Bridge, to give her a call @ # [phone] Telephone encounter on 02/23/2023 as well stating patient wants a call from [facility], can not addend that TE.

## 2023-02-26 NOTE — Telephone Encounter (Signed)
See previous note

## 2023-02-26 NOTE — Telephone Encounter (Signed)
Spoke with patient. Patient just wanted me to know she is feeling much better. She wanted to thank me for helping her.

## 2023-03-01 ENCOUNTER — Ambulatory Visit: Payer: No Typology Code available for payment source | Admitting: Critical Care Medicine
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
## 2023-03-05 ENCOUNTER — Ambulatory Visit: Payer: No Typology Code available for payment source | Admitting: Cardiovascular Disease

## 2023-03-08 ENCOUNTER — Other Ambulatory Visit: Payer: No Typology Code available for payment source

## 2023-03-15 ENCOUNTER — Telehealth: Payer: Self-pay

## 2023-03-15 NOTE — Telephone Encounter (Signed)
Called pt regarding monitor results Pt reported she continues to experience right sided chest pain occasionally when she takes a deep breath. Pt stated symptoms feels like an ache and reported pain is more prominent when lying down on her side or bending over.  Pt also reported she can sometimes feel symptoms in her back shoulder blade when she stretch her shoulders back and forth.  Pt stated symptoms feel similar to when she had costochondritis She also report symptoms started after taking 2 doses of wegovy. She stated she no longer take medication as her pcp feels she was allergic due to also experiencing dizziness.  Pt stated dizziness has subsided since no longer taking medication.  Will forward to Cadence Furth PA for recommendations Pt stated she would also like Dr. Windell Hummingbird recommendations.

## 2023-03-16 ENCOUNTER — Ambulatory Visit (HOSPITAL_BASED_OUTPATIENT_CLINIC_OR_DEPARTMENT_OTHER): Payer: No Typology Code available for payment source | Admitting: Pulmonary Disease

## 2023-03-16 VITALS — BP 136/72 | HR 71 | Resp 16 | Ht 64.5 in | Wt 195.7 lb

## 2023-03-16 DIAGNOSIS — R079 Chest pain, unspecified: Secondary | ICD-10-CM | POA: Diagnosis not present

## 2023-03-16 NOTE — Telephone Encounter (Signed)
Called patient and informed her of the following from Dr. Mariah Milling.  the way she describes it sounds like rib wall pain, musculoskeletal Will avoid heavy lifting, try some light stretching, hot compress, ibuprofen If no improvement may need chiropractic Can feel very similar to costochondritis Thx TG   Patient verbalizes understanding.

## 2023-03-16 NOTE — Patient Instructions (Addendum)
Costochondritis Musculoskeletal pain Try Ibuprofen 400 mg every 6 hours for 3-5 days   Doorway stretch Stand facing an open doorway. Raise your arms to the sides and bend your elbows to a 90 degree angle. Rest your forearm against the wall and with your elbow at shoulder height, and lean forward through the open doorway to stretch your chest muscles. Hold the stretch for 30 to 60 seconds before relaxing. Repeat 10 times. Variation: Raise your hands higher on the doorframe while stretching.  Foam roller or rolled towel stretch Lie down with a foam roller or rolled towel under your midback, knees bent comfortably, with arms out to the sides and elbows bent gently (to approximately 20 degrees). Hold this pose for 20 seconds then roll off of (or remove) the towel or roller. There should be no tension in the upper back during this rest period. Repeat 10 times. Variation: Move your arms slowly along the floor in an arc (as if you are making snow angels).  Stability ball stretch Sitting on a stability ball, roll down until your upper back is on the ball and your legs form a bridge. Relax arms to the sides; they should drop below your body. Hold pose for 60 seconds then relax. Repeat 10 times.  Sphinx pose Lie on your stomach while supporting yourself on your elbows. Then open your chest, stretching up and backwards, arching the back. Hold this pose for 10 seconds. Relax to a prone (face-down) position for 20 seconds. Repeat 10 times.  Lateral flexion Sit with your right arm raised above your head; use your left arm for support. Gently lean to the left and hold for 20 seconds. Repeat the same stretch towards the opposite side. Repeat cycle 10 times. Variations: Perform the lateral flexion exercise while standing. In addition, after straightening, you can flex forward, bending at the waist.  Patients should perform all of the stretches for optimal results. Stretching routines should be done once daily for 6  weeks then 3 times weekly for 6 additional weeks. Apply heat to the costochondral area for at least 5 minutes immediately before and after stretching (using a hot water bottle, a heating pad on low heat, or a warmed, moist cloth) to increase blood flow and relax the muscles. After stretching, cold may also be applied to the affected area (using an ice pack covered in a towel); 10 minutes on, then 10 minutes off, for 3 repetitions if the patient finds this helpful. If any of these exercises increase pain, stop immediately and rest to avoid injury. Avoid any additional exercises that exacerbate the symptoms. Graphic [phone] Version 1.0  2024 [employer] and/or its affiliates. All Rights Reserved.    Chest pain --ORDER pulmonary function test

## 2023-03-16 NOTE — Progress Notes (Unsigned)
Subjective:   PATIENT ID: Nicole Gould GENDER: female DOB: 26-Aug-1960, MRN: [id number]  Chief Complaint  Patient presents with   Consult    Chest Pain- took wegovy and had chest pain, dizziness and dropped her potassium. She thinks the pain is from the reaction of wegovy.Had some changes to medications    Reason for Visit: New consult for chest pain  [name] is a 62 year old female never smoker with HTN, prediabetes, allergic rhinitis, hx concussion, OSA who presents for evaluation for chest pain.  She reports chest discomfort/right sided chest pain that occurs daily. She has been evaluated by Cardiology including holter monitor with NSR and first degree AVB. Chest discomfort/pain is similar to when she had massage two years ago when masseuse walked on her back. She was diagnosed costochondritis that has been resolved but continue to have intermittent pain that is similar since then. Has not had pain in over one year until she started [facility] x 2 doses in August. No longer taking this due to concern about chest pain, dizziness and falling. Thinks mylanta helps but now that pain continues daily. Reports pain when taking a deep breath and sometimes with exhalation. Sometimes has a nonproductive cough. Has violent sneezing that triggers her chest pain. Sleeps on her right side due to hx left shoulder history.   Social History: Never smoker  I have personally reviewed patient's past medical/family/social history, allergies, current medications.  Past Medical History:  Diagnosis Date   Allergy    Anxiety    Chicken pox    Chronic Dizziness    Colon polyps    Concussion    Frequent headaches    GERD (gastroesophageal reflux disease)    on meds   History of stress test    a. 2005 - in setting of ectopy - reportedly nl.   Hypertension    a. 03/2018 24hr ABM: Mean BP 146/75, mean HR 61; Mean awake BP/HR 150/78, 62; Mean sleep BP/HR 130/65, 58.   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 mal seizures   Seasonal allergies    Seizure disorder in pregnancy (HCC)    Seizures (HCC)    with pregnancy 1981- pre clampsia      Family History  Problem Relation Age of Onset   Hypertension Mother    Diabetes Mother    Heart disease Father    Kidney disease Father    Hypertension Father    Diabetes Father    Cancer Maternal Grandfather        colon cancer   Colon cancer Maternal Grandfather        passed age 43   Colon polyps Neg Hx    Breast cancer Neg Hx    Esophageal cancer Neg Hx    Rectal cancer Neg Hx    Stomach cancer Neg Hx      Social History   Occupational History   Not on file  Tobacco Use   Smoking status: Never   Smokeless tobacco: Never  Vaping Use   Vaping status: Never Used  Substance and Sexual Activity   Alcohol use: Yes    Comment: very rare - glass red wine when that    Drug use: No   Sexual activity: Yes    Partners: Male    Allergies  Allergen Reactions   Losartan Shortness Of Breath  Dizziness,  Shortness of breath    Anesthetics, Amide Nausea And Vomiting   Benadryl [Diphenhydramine Hcl (Sleep)]     Made feel paralyzed when had benadryl with a cocktail for procedure  Decadron, bendayl, regaln combo at [facility] 2014- see care every where    Benicar [Olmesartan]     Presyncopal , light headed    Cardura [Doxazosin Mesylate] Other (See Comments)    "makes me feel terrible"   Fentanyl Hypertension    Severe htn    Losartan Potassium-Hctz     Dizziness,  Shortness of breath    Metformin And Related     "out of body experience"     Other     WEGOVY   Prilocaine Nausea And Vomiting     Outpatient Medications Prior to Visit  Medication Sig Dispense Refill   amLODipine (NORVASC) 2.5 MG tablet Take 2.5 mg by mouth daily.     azelastine (OPTIVAR) 0.05 % ophthalmic solution Place 1 drop into both eyes 2 (two) times daily as needed  (itchy/watery eyes). 6 mL 5   betamethasone, augmented, (DIPROLENE) 0.05 % lotion Apply 1 application. topically 2 (two) times daily as needed.     Biotin 1000 MCG tablet Take 1,000 mcg by mouth daily.     CINNAMON PO Take 1,000 mg by mouth daily.      fluticasone (FLONASE) 50 MCG/ACT nasal spray Place 1 spray into both nostrils daily. 9.9 g 6   glucose blood test strip See admin instructions.     Lancets (ONETOUCH DELICA PLUS LANCET33G) MISC SMARTSIG:1 Topical Daily     loratadine (CLARITIN) 10 MG tablet Take 1 tablet (10 mg total) by mouth daily. 30 tablet 11   Multiple Vitamins-Minerals (MULTIVITAMIN WITH MINERALS) tablet Take 1 tablet by mouth daily.     nebivolol (BYSTOLIC) 5 MG tablet Take 1 tablet (5 mg total) by mouth 2 (two) times daily. 180 tablet 3   ONETOUCH VERIO test strip 1 each daily.     pantoprazole (PROTONIX) 40 MG tablet TAKE 1 TABLET BY MOUTH TWICE A DAY 60 tablet 1   APPLE CIDER VINEGAR PO Take 15 mLs by mouth in the morning, at noon, and at bedtime. Takes 1 tbsp 2-3 times daily (Patient not taking: Reported on 02/12/2023)     MELATONIN PO Take 1 tablet by mouth at bedtime.     potassium chloride (KLOR-CON M) 20 MEQ tablet Take 1 tablet (20 mEq total) by mouth daily for 5 days. 5 tablet 0   No facility-administered medications prior to visit.    Review of Systems  Constitutional:  Negative for chills, diaphoresis, fever, malaise/fatigue and weight loss.  HENT:  Negative for congestion.   Respiratory:  Negative for cough, hemoptysis, sputum production, shortness of breath and wheezing.   Cardiovascular:  Negative for chest pain, palpitations and leg swelling.     Objective:   Vitals:   03/16/23 1108  BP: 136/72  Pulse: 71  Resp: 16  SpO2: 99%  Weight: 195 lb 11.2 oz (88.8 kg)  Height: 5' 4.5" (1.638 m)   SpO2: 99 %  Physical Exam: General: Well-appearing, no acute distress HENT: [place], AT Eyes: EOMI, no scleral icterus Respiratory: Clear to auscultation  bilaterally.  No crackles, wheezing or rales Cardiovascular: RRR, -M/R/G, no JVD Extremities:-Edema,-tenderness Neuro: AAO x4, CNII-XII grossly intact Psych: Normal mood, normal affect Musculoskeletal: Palpable tenderness along right chest/anterior ribs  Data Reviewed:  Imaging: CT Cardiac  CXR 02/07/23 - No infiltrate effusion or edema  PFT:  Spirometry 09/03/19 FVC 2.65 (86%) FEV1 2.08 (91%) Ratio 83   Interpretation: Normal spirometry  Labs:    Latest Ref Rng & Units 02/07/2023    2:01 PM 01/19/2023   11:16 AM 10/06/2022    9:18 AM  CBC  WBC 4.0 - 10.5 K/uL 3.2  3.3  6.8   Hemoglobin 12.0 - 15.0 g/dL 21.3  08.6  57.8   Hematocrit 36.0 - 46.0 % 40.8  39.5  41.0   Platelets 150 - 400 K/uL 156  PLATELET CLUMPS NOTED ON SMEAR, UNABLE TO ESTIMATE  198.0       Latest Ref Rng & Units 02/12/2023    4:53 PM 02/07/2023    2:01 PM 01/19/2023   11:16 AM  CMP  Glucose 70 - 99 mg/dL 97  78  94   BUN 8 - 23 mg/dL 15  12  15    Creatinine 0.44 - 1.00 mg/dL 4.69  6.29  5.28   Sodium 135 - 145 mmol/L 139  140  137   Potassium 3.5 - 5.1 mmol/L 3.3  3.2  3.6   Chloride 98 - 111 mmol/L 102  104  102   CO2 22 - 32 mmol/L 27  28  26    Calcium 8.9 - 10.3 mg/dL 9.1  9.3  9.1   Total Protein 6.5 - 8.1 g/dL  8.0    Total Bilirubin 0.3 - 1.2 mg/dL  0.7    Alkaline Phos 38 - 126 U/L  44    AST 15 - 41 U/L  19    ALT 0 - 44 U/L  16          Assessment & Plan:   Discussion: 62 year old female never smoker with HTN, prediabetes, allergic rhinitis, hx concussion, OSA who presents for evaluation for chest pain. History atypical for pulmonary etiology as pain is reproducible with movement. No associated respiratory symptoms. Has not tried an over-the-counters recently. Counseled on conservative management of costochondritis. She would still like to pursue PFTs to complete pulmonary work-up    Costochondritis Musculoskeletal pain Try Ibuprofen 400 mg every 6 hours for 3-5 days   Doorway stretch  Stand facing an open doorway. Raise your arms to the sides and bend your elbows to a 90 degree angle. Rest your forearm against the wall and with your elbow at shoulder height, and lean forward through the open doorway to stretch your chest muscles. Hold the stretch for 30 to 60 seconds before relaxing. Repeat 10 times. Variation: Raise your hands higher on the doorframe while stretching.  Foam roller or rolled towel stretch Lie down with a foam roller or rolled towel under your midback, knees bent comfortably, with arms out to the sides and elbows bent gently (to approximately 20 degrees). Hold this pose for 20 seconds then roll off of (or remove) the towel or roller. There should be no tension in the upper back during this rest period. Repeat 10 times. Variation: Move your arms slowly along the floor in an arc (as if you are making snow angels).  Stability ball stretch Sitting on a stability ball, roll down until your upper back is on the ball and your legs form a bridge. Relax arms to the sides; they should drop below your body. Hold pose for 60 seconds then relax. Repeat 10 times.  Sphinx pose Lie on your stomach while supporting yourself on your elbows. Then open your chest, stretching up and backwards, arching the back. Hold this pose for 10 seconds.  Relax to a prone (face-down) position for 20 seconds. Repeat 10 times.  Lateral flexion Sit with your right arm raised above your head; use your left arm for support. Gently lean to the left and hold for 20 seconds. Repeat the same stretch towards the opposite side. Repeat cycle 10 times. Variations: Perform the lateral flexion exercise while standing. In addition, after straightening, you can flex forward, bending at the waist.  Patients should perform all of the stretches for optimal results. Stretching routines should be done once daily for 6 weeks then 3 times weekly for 6 additional weeks. Apply heat to the costochondral area for at least 5 minutes  immediately before and after stretching (using a hot water bottle, a heating pad on low heat, or a warmed, moist cloth) to increase blood flow and relax the muscles. After stretching, cold may also be applied to the affected area (using an ice pack covered in a towel); 10 minutes on, then 10 minutes off, for 3 repetitions if the patient finds this helpful. If any of these exercises increase pain, stop immediately and rest to avoid injury. Avoid any additional exercises that exacerbate the symptoms. Graphic [id number] Version 1.0  2024 UpToDate, Inc. and/or its affiliates. All Rights Reserved.    Chest pain --ORDER pulmonary function test to rule out underlying pulmonary etiology  Health Maintenance Immunization History  Administered Date(s) Administered   Influenza-Unspecified 04/06/2014   PFIZER(Purple Top)SARS-COV-2 Vaccination 08/21/2019, 09/16/2019, 04/14/2020   Pfizer(Comirnaty)Fall Seasonal Vaccine 12 years and older 09/23/2022   Zoster Recombinant(Shingrix) 02/28/2022, 08/12/2022   CT Lung Screen - never smoker. Not qualified  Orders Placed This Encounter  Procedures   Pulmonary function test    Standing Status:   Future    Standing Expiration Date:   03/15/2024    Order Specific Question:   Where should this test be performed?    Answer:   [facility]    Order Specific Question:   Full PFT: includes the following: basic spirometry, spirometry pre & post bronchodilator, diffusion capacity (DLCO), lung volumes    Answer:   Full PFT  No orders of the defined types were placed in this encounter.   Return for Schedule PFT. WIll call with results.  I have spent a total time of 45-minutes on the day of the appointment reviewing prior documentation, coordinating care and discussing medical diagnosis and plan with the patient/family. Imaging, labs and tests included in this note have been reviewed and interpreted independently by me.  Mattia Liford Mechele Collin, MD [facility]  Critical Care 03/16/2023 11:33 AM  Office Number [phone]

## 2023-03-19 ENCOUNTER — Encounter (HOSPITAL_BASED_OUTPATIENT_CLINIC_OR_DEPARTMENT_OTHER): Payer: Self-pay | Admitting: Pulmonary Disease

## 2023-03-26 ENCOUNTER — Ambulatory Visit: Payer: No Typology Code available for payment source | Attending: Medical

## 2023-03-26 DIAGNOSIS — R55 Syncope and collapse: Secondary | ICD-10-CM

## 2023-03-26 DIAGNOSIS — R42 Dizziness and giddiness: Secondary | ICD-10-CM | POA: Diagnosis not present

## 2023-03-26 LAB — ECHOCARDIOGRAM COMPLETE
Area-P 1/2: 4.31 cm2
S' Lateral: 3.4 cm

## 2023-03-27 ENCOUNTER — Telehealth: Payer: Self-pay | Admitting: Cardiovascular Disease

## 2023-03-27 NOTE — Telephone Encounter (Signed)
Pt c/o medication issue:  1. Name of Medication: nebivolol (BYSTOLIC) 5 MG tablet   amLODipine (NORVASC) 2.5 MG tablet   2. How are you currently taking this medication (dosage and times per day)?  As written   3. Are you having a reaction (difficulty breathing--STAT)? No   4. What is your medication issue? Patient is calling because [facility] is stating they are only receiving the prescription for the Bystolic for 10 MG instead of the 5 MG twice a day. Patient is upset because the pill does not cut properly. Patient stated the AmLODipine is supposed to be 2.5 MG twice a day. Please advise.

## 2023-03-27 NOTE — Telephone Encounter (Signed)
Called pharmacy about Bystolic. Pharmacy reports that the 5 MG Bystolic is on back order and will notify patient when they have more available. Called patient and notified her that the Bystolic 5 MG are currently on back order. Patient verbalized understanding.  Patient requesting a refill for Amlodipine 2.5 MG twice daily. Patient informed that per her last office visit with Cadence Furth- PA-C that the Amlodipine was discontinued. Patient states that she was told to discontinue the Hydrochlorothiazide not Amlodipine.  Patient reports that she is currently taking Bystolic 5 MG twice daily and Amlodipine 2.5 MG twice daily. Patient reports that she no longer feels dizzy and that her blood pressure usually runs around 130/70. Patient requesting that Dr. Mariah Milling review her medications and to send in a prescription for Amlodipine. Will forward to provider.

## 2023-03-28 ENCOUNTER — Encounter: Payer: Self-pay | Admitting: Cardiovascular Disease

## 2023-03-28 ENCOUNTER — Ambulatory Visit: Payer: No Typology Code available for payment source | Admitting: Gastroenterology

## 2023-03-28 ENCOUNTER — Institutional Professional Consult (permissible substitution): Payer: No Typology Code available for payment source | Admitting: Pulmonary Disease

## 2023-03-28 ENCOUNTER — Ambulatory Visit
Payer: No Typology Code available for payment source | Attending: Cardiovascular Disease | Admitting: Cardiovascular Disease

## 2023-03-28 VITALS — BP 126/78 | HR 60 | Ht 64.5 in | Wt 193.6 lb

## 2023-03-28 DIAGNOSIS — R7303 Prediabetes: Secondary | ICD-10-CM

## 2023-03-28 DIAGNOSIS — R52 Pain, unspecified: Secondary | ICD-10-CM

## 2023-03-28 DIAGNOSIS — R42 Dizziness and giddiness: Secondary | ICD-10-CM

## 2023-03-28 DIAGNOSIS — E782 Mixed hyperlipidemia: Secondary | ICD-10-CM

## 2023-03-28 DIAGNOSIS — I10 Essential (primary) hypertension: Secondary | ICD-10-CM

## 2023-03-28 MED ORDER — NEBIVOLOL HCL 5 MG PO TABS: 5.0000 mg | ORAL_TABLET | Freq: Two times a day (BID) | ORAL | 3 refills | Status: DC

## 2023-03-28 MED ORDER — AMLODIPINE BESYLATE 2.5 MG PO TABS: 2.5000 mg | ORAL_TABLET | Freq: Every day | ORAL | 3 refills | Status: DC

## 2023-03-28 NOTE — Telephone Encounter (Signed)
Patient seen in clinic by Dr. Mariah Milling. All questions answered.

## 2023-03-28 NOTE — Patient Instructions (Signed)
Medication Instructions:  Bystolic 5 mg twice a day (print script), #180, with refills Note to pharmacy not to give 10 mg pill, only 5 mg pills BID  If you need a refill on your cardiac medications before your next appointment, please call your pharmacy.   Lab work: No new labs needed  Testing/Procedures: No new testing needed  Follow-Up: At [facility], you and your health needs are our priority.  As part of our continuing mission to provide you with exceptional heart care, we have created designated Provider Care Teams.  These Care Teams include your primary Cardiologist (physician) and Advanced Practice Providers (APPs -  Physician Assistants and Nurse Practitioners) who all work together to provide you with the care you need, when you need it.  You will need a follow up appointment in 12 months  Providers on your designated Care Team:   Nicolasa Ducking, NP Eula Listen, PA-C Cadence Fransico Michael, [place]  COVID-19 Vaccine Information can be found at: [email or web address] For questions related to vaccine distribution or appointments, please email [email or web address] or call [phone].

## 2023-03-28 NOTE — Progress Notes (Signed)
Date:  03/28/2023   ID:  [name], DOB Nov 11, 1960, MRN [id number]  Patient Location:  [address] [place] [address]   Provider location:   [facility], [place] office  PCP:  Marcine Matar, MD  Cardiologist:  [facility]  Chief Complaint  Patient presents with   Follow-up    1 mo f/u on echo. Continued chest pain,  Meds reviewed.    History of Present Illness:    Nicole Gould is a 62 y.o. female past medical history of palpitations, hypertension  Hypoglycemia various medication intolerances calcium score on 07/03/2019 which was 0.  Chronic dizziness unrelated to blood pressure, negative Holter, who presents for follow-up of her Blood pressure, chest pain, preop evaluation  LOV  with myself  July 2023 Seen by one of our providers February 12, 2023 It was recommended that she stop taking amlodipine and increase Bystolic 5 twice daily, stay on HCTZ Received a message that her Bystolic was on backorder Reports that she is still on amlodipine  Echocardiogram March 26, 2023 Normal LV and RV size and function,  On my review of images, moderately dilated left atrium Mild to moderate MR  Zio monitor with no significant arrhythmia  Went to the ER February 07, 2019 for chest pain, workup negative discharged home potassium 3.2 reported feeling dizzy At the time taking amlodipine 5mg , bystolic 5mg  and hydrochlorothiazide 12.5mg  daily.  Stopped [employer] Felt symptoms similar to costochondritis which she had in the past  Right side chest pain, center of back Hurts when sleeping on her right side Tried motrin, this seemed to help her pain  No EKG today   Documented hypertension medication intolerances: Cardura - dizziness Amlodipine - lower extremity swelling; headache HCTZ - hypokalemia and dry mouth, with capsule form increased UOP output/cramps Losartan - dizziness and shortness of breath; malaise Lisinopril -  cough Benicar - lightheadedness and presyncope Spironolactone - breast fullness Eplerenone - breast tenderness  Total Chol 184/ LDL 114 CT coronary calcium score 0   Past Medical History:  Diagnosis Date   Allergy    Anxiety    Chicken pox    Chronic Dizziness    Colon polyps    Concussion    Frequent headaches    GERD (gastroesophageal reflux disease)    on meds   History of stress test    a. 2005 - in setting of ectopy - reportedly nl.   Hypertension    a. 03/2018 24hr ABM: Mean BP 146/75, mean HR 61; Mean awake BP/HR 150/78, 62; Mean sleep BP/HR 130/65, 58.   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     azelastine (OPTIVAR) 0.05 % ophthalmic solution Place 1 drop into both  eyes 2 (two) times daily as needed (itchy/watery eyes). 6 mL 5   betamethasone, augmented, (DIPROLENE) 0.05 % lotion Apply 1 application. topically 2 (two) times daily as needed.     Biotin 1000 MCG tablet Take 1,000 mcg by mouth daily.     CINNAMON PO Take 1,000 mg by mouth daily.      fluticasone (FLONASE) 50 MCG/ACT nasal spray Place 1 spray into both nostrils daily. 9.9 g 6   glucose blood test strip See admin instructions.     Lancets (ONETOUCH DELICA PLUS LANCET33G) MISC SMARTSIG:1 Topical Daily     loratadine (CLARITIN) 10 MG tablet Take 1 tablet (10 mg total) by mouth daily. 30 tablet 11   Multiple  Vitamins-Minerals (MULTIVITAMIN WITH MINERALS) tablet Take 1 tablet by mouth daily.     nebivolol (BYSTOLIC) 5 MG tablet Take 1 tablet (5 mg total) by mouth 2 (two) times daily. 180 tablet 3   ONETOUCH VERIO test strip 1 each daily.     pantoprazole (PROTONIX) 40 MG tablet TAKE 1 TABLET BY MOUTH TWICE A DAY 60 tablet 1   No current facility-administered medications on file prior to visit.    Allergies:   Losartan; Anesthetics, amide; Benadryl [diphenhydramine hcl (sleep)]; Benicar [olmesartan]; Cardura [doxazosin mesylate]; Fentanyl; Losartan potassium-hctz; Metformin and related; Other; and Prilocaine   Social History   Tobacco Use   Smoking status: Never   Smokeless tobacco: Never  Vaping Use   Vaping status: Never Used  Substance Use Topics   Alcohol use: Yes    Comment: very rare - glass red wine when that    Drug use: No     Family Hx: The patient's family history includes Cancer in her maternal grandfather; Colon cancer in her maternal grandfather; Diabetes in her father and mother; Heart disease in her father; Hypertension in her father and mother; Kidney disease in her father. There is no history of Colon polyps, Breast cancer, Esophageal cancer, Rectal cancer, or Stomach cancer.  ROS:   Please see the history of present illness.    Review of Systems  Constitutional: Negative.   HENT: Negative.    Respiratory: Negative.    Cardiovascular: Negative.   Gastrointestinal: Negative.   Musculoskeletal:  Positive for joint pain.       Shoulder pain  Neurological: Negative.   Psychiatric/Behavioral: Negative.    All other systems reviewed and are negative.   Labs/Other Tests and Data Reviewed:    Recent Labs: 02/07/2023: ALT 16; Hemoglobin 13.2; Platelets 156 02/12/2023: BUN 15; Creatinine, Ser 0.75; Magnesium 2.4; Potassium 3.3; Sodium 139; TSH 1.310   Recent Lipid Panel Lab Results  Component Value Date/Time   CHOL 181 08/23/2022 09:50 AM   TRIG 52 08/23/2022 09:50  AM   HDL 58 08/23/2022 09:50 AM   CHOLHDL 3.1 08/23/2022 09:50 AM   CHOLHDL 3 08/27/2015 08:06 AM   LDLCALC 113 (H) 08/23/2022 09:50 AM   LDLDIRECT 113.0 09/16/2014 12:08 PM    Wt Readings from Last 3 Encounters:  03/16/23 195 lb 11.2 oz (88.8 kg)  02/13/23 196 lb 9.6 oz (89.2 kg)  02/12/23 196 lb 9.6 oz (89.2 kg)     Exam:    Vital Signs: Vital signs may also be detailed in the HPI LMP 12/01/2013 (Approximate)   Constitutional:  oriented to person, place, and time. No distress.  HENT:  Head: Grossly normal Eyes:  no discharge. No scleral icterus.  Neck: No JVD, no carotid bruits  Cardiovascular: Regular rate and rhythm, no  murmurs appreciated Pulmonary/Chest: Clear to auscultation bilaterally, no wheezes or rails Abdominal: Soft.  no distension.  no tenderness.  Musculoskeletal: Normal range of motion Neurological:  normal muscle tone. Coordination normal. No atrophy Skin: Skin warm and dry Psychiatric: normal affect, pleasant  ASSESSMENT & PLAN:    Essential hypertension, benign Blood pressure is well controlled on today's visit. No changes made to the medications.  She would like to stay on Bystolic 5 twice daily with amlodipine 2.5 daily She misinterpreted prior directions from our office to hold amlodipine and stay on hydrochlorothiazide.  She reports she is off HCTZ  Leg swelling Minimal swelling, she feels comfortable staying on amlodipine  anxiety Managed by primary care Better in retirement, exercising on a regular basis  Dizziness Chronic issue, unrelated to hypertension Reports dizziness better off HCTZ  Chest pain Atypical in nature, musculoskeletal pain, chest wall pain Recommend Motrin, hot pack, light stretching, may need chiropractic  Palpitations Continue beta-blocker, Zio monitor with no significant arrhythmia Stable symptoms   Total encounter time more than 30 minutes  Greater than 50% was spent in counseling and coordination of care with  the patient    Signed, Julien Nordmann, MD  03/28/2023 8:23 AM    [facility] [address], [place], [place] [address]

## 2023-03-30 ENCOUNTER — Other Ambulatory Visit: Payer: Self-pay | Admitting: Neurology

## 2023-03-30 DIAGNOSIS — R42 Dizziness and giddiness: Secondary | ICD-10-CM

## 2023-03-30 DIAGNOSIS — G43019 Migraine without aura, intractable, without status migrainosus: Secondary | ICD-10-CM

## 2023-04-02 ENCOUNTER — Telehealth: Payer: Self-pay

## 2023-04-02 NOTE — Telephone Encounter (Signed)
Copied from CRM [id number]. Topic: General - Other >> Apr 02, 2023 10:35 AM Franchot Heidelberg wrote: Reason for CRM: Pt called and wants to speak to [facility] regarding a question  Best contact: [phone]

## 2023-04-02 NOTE — Telephone Encounter (Signed)
Call placed to patient . Patient reports that she is having some chest discomfort. Patient reports that she has spoken to her cardiologist about this and he suggested warm compresses and IBU as he feels it is muscle related. Patient voiced that when she does take IBU it does help the chest discomfort. Patient voiced that she is calling to make is ok with her PCP that she take the IBU . Advised that it is ok to continue to Take the IBU as directed by her cardiologist. Patient has an appointment on 11/16 and this can be discussed with her PCP further if needed.

## 2023-04-05 ENCOUNTER — Ambulatory Visit: Payer: No Typology Code available for payment source | Attending: Internal Medicine | Admitting: Internal Medicine

## 2023-04-05 ENCOUNTER — Encounter: Payer: Self-pay | Admitting: Internal Medicine

## 2023-04-05 ENCOUNTER — Telehealth: Payer: Self-pay | Admitting: Cardiovascular Disease

## 2023-04-05 VITALS — BP 130/75 | HR 61 | Temp 98.0°F | Ht 64.0 in | Wt 195.0 lb

## 2023-04-05 DIAGNOSIS — E876 Hypokalemia: Secondary | ICD-10-CM

## 2023-04-05 DIAGNOSIS — E66811 Obesity, class 1: Secondary | ICD-10-CM | POA: Diagnosis not present

## 2023-04-05 DIAGNOSIS — I340 Nonrheumatic mitral (valve) insufficiency: Secondary | ICD-10-CM

## 2023-04-05 DIAGNOSIS — R0789 Other chest pain: Secondary | ICD-10-CM

## 2023-04-05 DIAGNOSIS — I10 Essential (primary) hypertension: Secondary | ICD-10-CM | POA: Diagnosis not present

## 2023-04-05 DIAGNOSIS — Z2821 Immunization not carried out because of patient refusal: Secondary | ICD-10-CM

## 2023-04-05 NOTE — Telephone Encounter (Signed)
  The patient mentioned that during her appointment with Dr. Mariah Milling on March 28, 2023, she inquired about her echocardiogram results, and Dr. Mariah Milling informed her that everything looks good. However, she received a call yesterday indicating that she have a valve leakage. She would appreciate clarification from Dr. Mariah Milling regarding this matter

## 2023-04-05 NOTE — Telephone Encounter (Signed)
Will route to MD to review as well. ECHO results posted from 10/07- visit with MD on 10/09.   Thanks!

## 2023-04-05 NOTE — Progress Notes (Signed)
Patient ID: Nicole Gould, female    DOB: June 21, 1960  MRN: [id number]  CC: Hypertension (HTN f/u. /R chest discomfornt, pain, soreness - cardio informed pt it may be a strained muscle/No to flu vax)   Subjective: Nicole Gould is a 62 y.o. female who presents for chronic ds management. Her concerns today include:  Pt with hx of HTN, HL, obesity/PreDM, seasonal allergies, chronic dizziness   HTN:  On last visit with me, she was left on HCTZ 12.5 mg daily, Norvasc 5 mg daily, and Bystolic 10 mg daily. Since then, she tells me cardiologist has made changes.   Hydrochlorothiazide was stopped, Norvasc changed to 5 mg 1/2 tab BID and Bystolic changed 5 mg BID   Saw endocrinologist, Dr. Talmage Nap, in July.  Started on Wegovy to help with wgh loss and prevent progression of preDM.  Wegovy caused dizziness, polyuria, pain in center of chest. Called Dr. Talmage Nap who told her to stop Wegovy and be seen in ER Went to ER. K+ was low but no acute cardiac issue. Placed on Potassium supplement.  Wegovy stopped Pain in RT upper chest persisted. Saw pulmonary Dr. Everardo All,  Told to take Motrin and warm compresses.  Pain went away but came back once she stopped the Motrin.  Then saw her cardiologist Dr. Mariah Milling. Echo was ok except for some mild-mod MR. Reports being told it is a muscle strain not her heart.  Advised may be want to see a chiropractor.  Worse when she curle up at nights  Claritin helped decreased the dizziness  Wanting to get her weight down.  Just started working with [employer] program through telehealth. Patient Active Problem List   Diagnosis Date Noted   Hypokalemia 02/13/2023   Lumbar radiculopathy 05/17/2020   OSA (obstructive sleep apnea) 03/31/2020   Postconcussive syndrome 01/23/2020   Gastroesophageal reflux disease 08/30/2019   Hyperlipidemia 06/26/2019   Dysesthesia 06/24/2019   Dry mouth, unspecified 06/24/2019   Vaccine counseling 06/24/2019   Obesity (BMI  30.0-34.9) 05/06/2019   Insomnia due to stress, anxiety and fear 06/06/2018   Noncompliance with diet and medication regimen 04/21/2018   Mood disorder (HCC) 03/07/2018   Polyp of corpus uteri 02/08/2018   Hx of seizure disorder 11/10/2017   Migraine without aura and without status migrainosus, not intractable 11/10/2017   Prediabetes 11/10/2017   Spells of trembling 11/10/2017   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   Reactive hypoglycemia 03/25/2017   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   Dizziness 10/08/2016   Obesity with body mass index 30 or greater 08/09/2016   History of adenomatous polyp of colon 08/04/2016   Seasonal and perennial allergic rhinoconjunctivitis 07/12/2016   Chronic suprapubic pain 05/30/2015   Menopausal symptom 10/25/2014   Primary insomnia 05/05/2014   Anxiety 05/05/2014   Chronic venous insufficiency 04/21/2014   Adenomatous polyp of colon 05/12/2013   Pain 03/11/2013   Essential hypertension, benign 01/28/2013   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 90 tablet 3   azelastine (OPTIVAR) 0.05 % ophthalmic solution Place 1 drop into both eyes 2 (two) times daily as needed (itchy/watery eyes). 6 mL 5   betamethasone, augmented, (DIPROLENE) 0.05 % lotion Apply 1 application. topically 2 (two) times daily as needed.     Biotin 1000 MCG tablet Take 1,000  mcg by mouth daily.     CINNAMON PO Take 1,000 mg by mouth daily.      fluticasone (FLONASE) 50 MCG/ACT nasal spray Place 1 spray into both nostrils daily. 9.9 g 6   glucose blood test strip See admin instructions.     Lancets (ONETOUCH DELICA PLUS LANCET33G) MISC SMARTSIG:1 Topical Daily     loratadine (CLARITIN) 10 MG tablet Take 1 tablet (10 mg total) by mouth daily. 30 tablet 11   Multiple Vitamins-Minerals  (MULTIVITAMIN WITH MINERALS) tablet Take 1 tablet by mouth daily.     nebivolol (BYSTOLIC) 5 MG tablet Take 1 tablet (5 mg total) by mouth 2 (two) times daily. 180 tablet 3   ONETOUCH VERIO test strip 1 each daily.     pantoprazole (PROTONIX) 40 MG tablet TAKE 1 TABLET BY MOUTH TWICE A DAY 60 tablet 1   No current facility-administered medications on file prior to visit.    Allergies  Allergen Reactions   Losartan Shortness Of Breath    Dizziness,  Shortness of breath    Anesthetics, Amide Nausea And Vomiting   Benadryl [Diphenhydramine Hcl (Sleep)]     Made feel paralyzed when had benadryl with a cocktail for procedure  Decadron, bendayl, regaln combo at [facility] 2014- see care every where    Benicar [Olmesartan]     Presyncopal , light headed    Cardura [Doxazosin Mesylate] Other (See Comments)    "makes me feel terrible"   Fentanyl Hypertension    Severe htn    Losartan Potassium-Hctz     Dizziness,  Shortness of breath    Metformin And Related     "out of body experience"     Other     WEGOVY   Prilocaine Nausea And Vomiting    Social History   Socioeconomic History   Marital status: Married    Spouse name: Not on file   Number of children: Not on file   Years of education: Not on file   Highest education level: Not on file  Occupational History   Not on file  Tobacco Use   Smoking status: Never   Smokeless tobacco: Never  Vaping Use   Vaping status: Never Used  Substance and Sexual Activity   Alcohol use: Yes    Comment: very rare - glass red wine when that    Drug use: No   Sexual activity: Yes    Partners: Male  Other Topics Concern   Not on file  Social History Narrative   Not on file   Social Determinants of Health   Financial Resource Strain: Not on file  Food Insecurity: Not on file  Transportation Needs: Not on file  Physical Activity: Not on file  Stress: Not on file  Social Connections: Not on file  Intimate Partner Violence: Not on file     Family History  Problem Relation Age of Onset   Hypertension Mother    Diabetes Mother    Heart disease Father    Kidney disease Father    Hypertension Father    Diabetes Father    Cancer Maternal Grandfather        colon cancer   Colon cancer Maternal Grandfather        passed age 2   Colon polyps Neg Hx    Breast cancer Neg Hx    Esophageal cancer Neg Hx    Rectal cancer Neg Hx    Stomach cancer Neg Hx     Past Surgical History:  Procedure Laterality Date   COLONOSCOPY  2018   Pyrtle-MAC-suprep(exc)-HPP   DILATION AND CURETTAGE OF UTERUS  09/08/2014   with hystereoscopy   POLYPECTOMY  2018   HPP   TONSILLECTOMY     TUBAL LIGATION      ROS: Review of Systems Negative except as stated above  PHYSICAL EXAM: BP 130/75 (BP Location: Left Arm, Patient Position: Sitting, Cuff Size: Normal)   Pulse 61   Temp 98 F (36.7 C) (Oral)   Ht 5\' 4"  (1.626 m)   Wt 195 lb (88.5 kg)   LMP 12/01/2013 (Approximate)   SpO2 100%   BMI 33.47 kg/m   Wt Readings from Last 3 Encounters:  04/05/23 195 lb (88.5 kg)  03/28/23 193 lb 9.6 oz (87.8 kg)  03/16/23 195 lb 11.2 oz (88.8 kg)    Physical Exam  General appearance - alert, well appearing, older AAF and in no distress Mental status - normal mood, behavior, speech, dress, motor activity, and thought processes Chest - clear to auscultation, no wheezes, rales or rhonchi, symmetric air entry Heart - normal rate, regular rhythm, normal S1, S2, no murmurs, rubs, clicks or gallops Extremities - trace ankle edema      Latest Ref Rng & Units 02/12/2023    4:53 PM 02/07/2023    2:01 PM 01/19/2023   11:16 AM  CMP  Glucose 70 - 99 mg/dL 97  78  94   BUN 8 - 23 mg/dL 15  12  15    Creatinine 0.44 - 1.00 mg/dL 6.29  5.28  4.13   Sodium 135 - 145 mmol/L 139  140  137   Potassium 3.5 - 5.1 mmol/L 3.3  3.2  3.6   Chloride 98 - 111 mmol/L 102  104  102   CO2 22 - 32 mmol/L 27  28  26    Calcium 8.9 - 10.3 mg/dL 9.1  9.3  9.1    Total Protein 6.5 - 8.1 g/dL  8.0    Total Bilirubin 0.3 - 1.2 mg/dL  0.7    Alkaline Phos 38 - 126 U/L  44    AST 15 - 41 U/L  19    ALT 0 - 44 U/L  16     Lipid Panel     Component Value Date/Time   CHOL 181 08/23/2022 0950   TRIG 52 08/23/2022 0950   HDL 58 08/23/2022 0950   CHOLHDL 3.1 08/23/2022 0950   CHOLHDL 3 08/27/2015 0806   VLDL 11.0 08/27/2015 0806   LDLCALC 113 (H) 08/23/2022 0950   LDLDIRECT 113.0 09/16/2014 1208    CBC    Component Value Date/Time   WBC 3.2 (L) 02/07/2023 1401   RBC 4.49 02/07/2023 1401   HGB 13.2 02/07/2023 1401   HGB 12.0 08/23/2022 0950   HCT 40.8 02/07/2023 1401   HCT 34.4 08/23/2022 0950   PLT 156 02/07/2023 1401   PLT 175 08/23/2022 0950   MCV 90.9 02/07/2023 1401   MCV 89 08/23/2022 0950   MCV 92 09/08/2014 2059   MCH 29.4 02/07/2023 1401   MCHC 32.4 02/07/2023 1401   RDW 13.4 02/07/2023 1401   RDW 13.7 08/23/2022 0950   RDW 14.3 09/08/2014 2059   LYMPHSABS 1.2 10/06/2022 0918   LYMPHSABS 1.4 06/24/2019 0906   MONOABS 0.2 10/06/2022 0918   EOSABS 0.0 10/06/2022 0918   EOSABS 0.0 06/24/2019 0906   BASOSABS 0.0 10/06/2022 0918   BASOSABS 0.0 06/24/2019 0906    ASSESSMENT AND PLAN: 1. Essential  hypertension At goal.  She will continue low-dose Norvasc 2.5 mg twice a day and Bystolic 5 mg twice a day. - Basic Metabolic Panel  2. Atypical chest pain Patient has been evaluated by her cardiologist and pulmonology.  Chest pain seems atypical and likely musculoskeletal.  Advised that she can alternate NSAID with Tylenol.  She wanted to know whether it is worse seeing a [facility].  I told her that she can give it a try.  3. Hypokalemia - Basic Metabolic Panel  4. Obesity (BMI 30.0-34.9) We discussed getting her in with our medical weight management program since she lives in the [place] area and would be able to go in person rather than telehealth through [facility]. - Amb Ref to Medical Weight Management  5. Mitral valve  insufficiency, unspecified etiology Observe for now, asymptomatic  6. Influenza vaccination declined          Patient was given the opportunity to ask questions.  Patient verbalized understanding of the plan and was able to repeat key elements of the plan.   This documentation was completed using Paediatric nurse.  Any transcriptional errors are unintentional.  No orders of the defined types were placed in this encounter.    Requested Prescriptions    No prescriptions requested or ordered in this encounter    No follow-ups on file.  Jonah Blue, MD, FACP

## 2023-04-05 NOTE — Patient Instructions (Addendum)
Okay to try chiropractor but avoid having them do any compresses weights on the chest.  I would alternate Motrin with Tylenol as needed.  I have referred you to [facility] health medical weight management.  They will call you with an appointment.

## 2023-04-06 ENCOUNTER — Other Ambulatory Visit: Payer: Self-pay | Admitting: Obstetrics and Gynecology

## 2023-04-06 DIAGNOSIS — N644 Mastodynia: Secondary | ICD-10-CM

## 2023-04-06 LAB — BASIC METABOLIC PANEL
BUN/Creatinine Ratio: 22 (ref 12–28)
BUN: 18 mg/dL (ref 8–27)
CO2: 25 mmol/L (ref 20–29)
Calcium: 9.6 mg/dL (ref 8.7–10.3)
Chloride: 103 mmol/L (ref 96–106)
Creatinine, Ser: 0.82 mg/dL (ref 0.57–1.00)
Glucose: 92 mg/dL (ref 70–99)
Potassium: 4.1 mmol/L (ref 3.5–5.2)
Sodium: 143 mmol/L (ref 134–144)
eGFR: 80 mL/min/{1.73_m2} (ref >59)

## 2023-04-06 NOTE — Telephone Encounter (Signed)
Called patient and notified her of the following from Dr. Mariah Milling.  There is normal cardiac function on echo  Mitral valve with mild to moderate regurgitation, also mild regurgitation of tricuspid valve  Typically this does not cause symptoms  This can be monitored with periodic echo, should not be needed on yearly basis  Treatment for this is progressive blood pressure control as we are doing  HCTZ could be taken as needed for ankle swelling  Thx  TGollan   Patient verbalizes understanding.

## 2023-04-17 ENCOUNTER — Ambulatory Visit: Payer: No Typology Code available for payment source | Admitting: Cardiovascular Disease

## 2023-04-18 ENCOUNTER — Other Ambulatory Visit: Payer: Self-pay | Admitting: Cardiovascular Disease

## 2023-04-18 ENCOUNTER — Telehealth: Payer: Self-pay | Admitting: Cardiovascular Disease

## 2023-04-18 NOTE — Telephone Encounter (Signed)
Called patient, she states they told her it was going to cost her almost $300 for a 90 day supply. Advising that they had an issue trying to get the 5 mg Bystolic. She would like a 90 day supply of the 10 mg Bystolic sent back to the pharmacy, she will cut this in half. Advised I would check with MD to make sure okay to update the RX.  Thanks!

## 2023-04-18 NOTE — Telephone Encounter (Signed)
Pt c/o medication issue:  1. Name of Medication:   nebivolol (BYSTOLIC) 5 MG tablet   2. How are you currently taking this medication (dosage and times per day)?   3. Are you having a reaction (difficulty breathing--STAT)?   4. What is your medication issue?   Patient stated she was recently prescribed this medication but patient stated her pharmacy is charging her too much for this medication.  Patient want a prescription for the 10 mg dosage as before.  Patient stated she wants a 90 day supply of the medication sent to [id number] - [place], [place] - [address]

## 2023-04-19 ENCOUNTER — Ambulatory Visit (INDEPENDENT_AMBULATORY_CARE_PROVIDER_SITE_OTHER): Payer: No Typology Code available for payment source | Admitting: Family Medicine

## 2023-04-19 ENCOUNTER — Encounter (INDEPENDENT_AMBULATORY_CARE_PROVIDER_SITE_OTHER): Payer: Self-pay | Admitting: Family Medicine

## 2023-04-19 VITALS — BP 124/72 | HR 74 | Temp 98.5°F | Ht 64.0 in | Wt 191.0 lb

## 2023-04-19 DIAGNOSIS — Z6832 Body mass index (BMI) 32.0-32.9, adult: Secondary | ICD-10-CM

## 2023-04-19 DIAGNOSIS — E669 Obesity, unspecified: Secondary | ICD-10-CM | POA: Diagnosis not present

## 2023-04-19 DIAGNOSIS — R7303 Prediabetes: Secondary | ICD-10-CM

## 2023-04-19 DIAGNOSIS — I10 Essential (primary) hypertension: Secondary | ICD-10-CM

## 2023-04-19 NOTE — Progress Notes (Signed)
Carlye Grippe, DO, ABFM, ABOM Bariatric physician [address] [place], [place], [place] [address] Office: [phone]  /  Fax: [phone]   Initial Evaluation:  Nicole Gould was seen in clinic today to evaluate for obesity. She is interested in losing weight to improve overall health and reduce the risk of weight related complications. She presents today to review program treatment options, initial physical assessment, and evaluation.   She was referred by: PCP  (In the past, I was patient's PCP)  When asked how has your weight affected you? She states: Has affected self-esteem  Contributing factors to her weight change: Family history, Stress, and Reduced physical activity  Some associated conditions: Hypertension, OSA-borderline, Pre-diabetes, GERD.   Current nutrition plan: Low carb meal plan per [facility]   Current level of physical activity: walking 60-90 minutes, 5-6 days a week.   Current or previous pharmacotherapy: Was previously on [facility] for 2 wks per endocrinologist Dr.Balan.  Response to medication: Wegovy discontinued due to adverse side effects (dizziness, polyuria, & pain in center of chest)  @[employer] @    Past Medical History:  Diagnosis Date   Allergy    Anxiety    Chicken pox    Chronic Dizziness    Colon polyps    Concussion    Frequent headaches    GERD (gastroesophageal reflux disease)    on meds   History of stress test    a. 2005 - in setting of ectopy - reportedly nl.   Hypertension    a. 03/2018 24hr ABM: Mean BP 146/75, mean HR 61; Mean awake BP/HR 150/78, 62; Mean sleep BP/HR 130/65, 58.   Migraines    OSA (obstructive sleep apnea)    No Cpap at this time   Pre-diabetes    Pre-eclampsia    with grand mal seizures   Seasonal allergies    Seizure disorder in pregnancy (HCC)    Seizures (HCC)    with pregnancy 1981- pre clampsia     Objective:  BP 124/72   Pulse 74   Temp 98.5 F (36.9 C)   Ht 5\' 4"  (1.626 m)   Wt 191  lb (86.6 kg)   LMP 12/01/2013 (Approximate)   SpO2 100%   BMI 32.79 kg/m  She was weighed on the bioimpedance scale: Body mass index is 32.79 kg/m.  Visceral Fat %: 12, Body Fat %: 41.4%  No data recorded No data recorded   Vitals Temp: 98.5 F (36.9 C) BP: 124/72 Pulse Rate: 74 SpO2: 100 %   Anthropometric Measurements Height: 5\' 4"  (1.626 m) Weight: 191 lb (86.6 kg) BMI (Calculated): 32.77   Body Composition  Body Fat %: 41.4 % Fat Mass (lbs): 79.4 lbs Muscle Mass (lbs): 106.8 lbs Total Body Water (lbs): 73.8 lbs Visceral Fat Rating : 12   Other Clinical Data Comments: info session    General: Well Developed, well nourished, and in no acute distress.  HEENT: Normocephalic, atraumatic Skin: Warm and dry, good turgor Chest:  Normal excursion, shape, no gross ABN Respiratory: no conversational dyspnea; speaking in full sentences NeuroM-Sk:  normal gross ROM * 4 extremities  Psych: A and O *3, insight adequate, mood- full   Assessment and Plan:   Essential hypertension, benign Assessment & Plan: Last 3 blood pressure readings in our office are as follows: BP Readings from Last 3 Encounters:  04/19/23 124/72  04/05/23 130/75  03/28/23 126/78   HTN treated with Norvasc 2.5 mg daily & Bystolic 5 mg bid. Blood pressure stable  today; no concerns in this regard.  Discussed how a low salt, heart healthy meal plan can further improve blood pressure control. Discussed with pt that Bystolic can be counterproductive to weight loss; pt advised to discuss possible alternatives to Bystolic with cardiology at next OV. Continue with all antihypertensives per specialist.    Prediabetes Assessment & Plan: No current meds. Diet/exercise approach. Most recent Hemoglobin A1c of 5.9 on 08/23/22.   Discussed with pt that a reduced calorie nutritional plan that is focused on decreasing simple carbs/sugars; increasing fiber and proteins can improve her HbA1c. Will check labs as  part of comprehensive screening if pt is to join program.    Obesity with body mass index 30 or greater Assessment & Plan: We reviewed weight, biometrics, associated medical conditions and contributing factors with patient. she would benefit from weight loss therapy via a modified calorie, low-carb, high-protein nutritional plan tailored to their REE (resting energy expenditure) which will be determined by indirect calorimetry at their next office visit.    Action Plan: she was weighed on the bioimpedance scale and results were discussed and documented in the synopsis.   Nicole Gould will complete provided nutritional and psychosocial assessment questionnaire before the next appointment.  she will be scheduled for indirect calorimetry to determine resting energy expenditure in a fasting state.  This will allow [place] to create a reduced calorie, high-protein meal plan to promote loss of fat mass while preserving muscle mass.  We will also assess for cardiometabolic risk and nutritional derangements via an ECG and fasting serologies at her next appointment.  she was encouraged to work on amassing support from family and friends to begin their weight loss journey.   Work on eliminating or reducing the presence of highly processed, poorly nutritious, calorie-dense foods in the home.  Obesity Education Performed Today: Patient was counseled on nutritional approaches to weight loss and benefits of reducing processed foods and consuming plant-based foods and high quality protein as part of nutritional weight management program.   We discussed the importance of long term lifestyle changes which include nutrition, exercise and behavioral modifications as well as the importance of customizing this to her specific health and social needs.   We discussed the benefits of reaching a healthier weight to alleviate the symptoms of existing conditions and reduce the risks of the biomechanical, metabolic and  psychological effects of obesity.  Was counseled on the health benefits of losing 5%-10% of total body weight.  Was counseled on our cognitive behavorial therapy program, lead by our bariatric psychologist, who focuses on emotional eating and creating positive behavorial change.  Was counseled on bariatric pharmacotherapy and how this may be used as an adjunct in their weight management   Nicole Gould appears to be in the action stage of change and states they are ready to start intensive lifestyle modifications and behavioral modifications.  It was recommended that she follow up in the next 1-2 weeks to review the above steps, and to continue with treatment of their chronic disease state of obesity  Attestations:   Reviewed by clinician on day of visit: allergies, medications, problem list, medical history, surgical history, family history, social history, and previous encounter notes pertinent to obesity diagnosis. 50 minutes was spent today on this visit including the above counseling, pre-visit chart review, and post-visit documentation.  Over 50% of this time was spent in direct, face-to-face counseling and coordination of care  I, Special Randolm Idol , acting as a Stage manager for [employer], DO.,  have compiled all relevant documentation for today's office visit on behalf of Thomasene Lot, DO, while in the presence of [employer], DO.  I have reviewed the above documentation for accuracy and completeness, and I agree with the above. Carlye Grippe, D.O.  The 21st Century Cures Act was signed into law in 2016 which includes the topic of electronic health records.  This provides immediate access to information in MyChart.  This includes consultation notes, operative notes, office notes, lab results and pathology reports.  If you have any questions about what you read please let us know at your next visit so we can discuss your concerns and take corrective action if need be.  We are right  here with you!

## 2023-04-20 ENCOUNTER — Other Ambulatory Visit: Payer: Self-pay

## 2023-04-20 ENCOUNTER — Other Ambulatory Visit: Payer: No Typology Code available for payment source

## 2023-04-20 MED ORDER — NEBIVOLOL HCL 10 MG PO TABS: 10.0000 mg | ORAL_TABLET | Freq: Every day | ORAL | 3 refills | Status: DC

## 2023-04-20 MED ORDER — NEBIVOLOL HCL 5 MG PO TABS: 5.0000 mg | ORAL_TABLET | Freq: Two times a day (BID) | ORAL | 3 refills | Status: DC

## 2023-04-20 NOTE — Telephone Encounter (Signed)
Called patient and left a message with the following.  We can send in Bystolic 10 mg daily and she can cut in half twice a day Would also check good Rx, cheap offerings for 5 mg pills Thx TG   Prescription sent to preferred pharmacy.

## 2023-04-23 ENCOUNTER — Encounter (HOSPITAL_BASED_OUTPATIENT_CLINIC_OR_DEPARTMENT_OTHER): Payer: No Typology Code available for payment source

## 2023-04-26 ENCOUNTER — Telehealth: Payer: Self-pay | Admitting: Cardiovascular Disease

## 2023-04-26 DIAGNOSIS — L700 Acne vulgaris: Secondary | ICD-10-CM | POA: Insufficient documentation

## 2023-04-26 DIAGNOSIS — L658 Other specified nonscarring hair loss: Secondary | ICD-10-CM | POA: Insufficient documentation

## 2023-04-26 NOTE — Telephone Encounter (Signed)
Called and spoke with patient. Patient requesting an appointment with Dr. Mariah Milling. Patient states that her dermatologist wants to start her on Minoxidil Rogaine and was advised to check with cardiology prior to starting the medication. Patient also states that she is seeing a weight loss provider. She was told that the bystolic maybe preventing her from losing weight and would like to discuss other options. Patient scheduled with Dr. Mariah Milling on 04/30/23.

## 2023-04-26 NOTE — Telephone Encounter (Signed)
Patient is requesting to speak with a nurse regards to her medication list. Please advise.

## 2023-04-29 NOTE — Progress Notes (Unsigned)
Date:  04/30/2023   ID:  Nicole Gould, DOB 1960/09/01, MRN [id number]  Patient Location:  [address] [place] [address]   Provider location:   [facility], [place] office  PCP:  Marcine Matar, MD  Cardiologist:  [facility]  Chief Complaint  Patient presents with   Medication Problem    Patient c/o issues with Bystolic & weight gain, some chest pain/discomfort since July 2024. Would like to discuss Echo results. Medications reviewed by the patient verbally.     History of Present Illness:    Nicole Gould is a 62 y.o. female past medical history of palpitations, hypertension  Hypoglycemia various medication intolerances calcium score on 07/03/2019 which was 0.  Chronic dizziness unrelated to blood pressure, negative Holter, who presents for follow-up of her Blood pressure, chest pain, preop evaluation  LOV  with myself 1 month ago March 28, 2023  Seen by weight loss management, they recommend she consider changing off her Bystolic  Seen by dermatology, they suggested she take minoxidil pill She feels her palpitations are well-controlled, feels blood pressure well-controlled on current regiment Currently taking Bystolic 5 twice daily with amlodipine 2.5 daily Minimal ankle swelling Currently not on hydrochlorothiazide  Exercising, denies significant chest pain on exertion  Echocardiogram March 26, 2023 Normal LV and RV size and function,  moderately dilated left atrium Mild to moderate MR  Zio monitor with no significant arrhythmia  Went to the ER February 07, 2019 for chest pain, workup negative discharged home potassium 3.2 reported feeling dizzy At the time taking amlodipine 5mg , bystolic 5mg  and hydrochlorothiazide 12.5mg  daily.  Stopped [employer] Felt symptoms similar to costochondritis which she had in the past  Right side chest pain, center of back Hurts when sleeping on her right side Tried motrin, this  seemed to help her pain  No EKG today  Documented hypertension medication intolerances: Cardura - dizziness Amlodipine - lower extremity swelling; headache HCTZ - hypokalemia and dry mouth, with capsule form increased UOP output/cramps Losartan - dizziness and shortness of breath; malaise Lisinopril - cough Benicar - lightheadedness and presyncope Spironolactone - breast fullness Eplerenone - breast tenderness  Total Chol 184/ LDL 114 CT coronary calcium score 0   Past Medical History:  Diagnosis Date   Allergy    Anxiety    Chicken pox    Chronic Dizziness    Colon polyps    Concussion    Frequent headaches    GERD (gastroesophageal reflux disease)    on meds   History of stress test    a. 2005 - in setting of ectopy - reportedly nl.   Hypertension    a. 03/2018 24hr ABM: Mean BP 146/75, mean HR 61; Mean awake BP/HR 150/78, 62; Mean sleep BP/HR 130/65, 58.   Migraines    OSA (obstructive sleep apnea)    No Cpap at this time   Pre-diabetes    Pre-eclampsia    with grand mal seizures   Seasonal allergies    Seizure disorder in pregnancy (HCC)    Seizures (HCC)    with pregnancy 1981- pre clampsia    Past Surgical History:  Procedure Laterality Date   COLONOSCOPY  2018   Pyrtle-MAC-suprep(exc)-HPP   DILATION AND CURETTAGE OF UTERUS  09/08/2014   with hystereoscopy   POLYPECTOMY  2018   HPP   TONSILLECTOMY     TUBAL LIGATION      Current Outpatient Medications on File Prior to  Visit  Medication Sig Dispense Refill   amLODipine (NORVASC) 2.5 MG tablet Take 1 tablet (2.5 mg total) by mouth daily. 90 tablet 3   azelastine (OPTIVAR) 0.05 % ophthalmic solution Place 1 drop into both eyes 2 (two) times daily as needed (itchy/watery eyes). 6 mL 5   betamethasone, augmented, (DIPROLENE) 0.05 % lotion Apply 1 application. topically 2 (two) times daily as needed.     Biotin 1000 MCG tablet Take 1,000 mcg by mouth daily.     CINNAMON PO Take 1,000 mg by mouth daily.       fluticasone (FLONASE) 50 MCG/ACT nasal spray Place 1 spray into both nostrils daily. 9.9 g 6   glucose blood test strip See admin instructions.     Lancets (ONETOUCH DELICA PLUS LANCET33G) MISC SMARTSIG:1 Topical Daily     loratadine (CLARITIN) 10 MG tablet Take 1 tablet (10 mg total) by mouth daily. 30 tablet 11   Multiple Vitamins-Minerals (MULTIVITAMIN WITH MINERALS) tablet Take 1 tablet by mouth daily.     Multiple Vitamins-Minerals (OCUVITE EXTRA PO) Take by mouth daily.     nebivolol (BYSTOLIC) 10 MG tablet Take 5 mg by mouth in the morning and at bedtime.     ONETOUCH VERIO test strip 1 each daily.     pantoprazole (PROTONIX) 40 MG tablet TAKE 1 TABLET BY MOUTH TWICE A DAY 60 tablet 1   Turmeric (QC TUMERIC COMPLEX PO) Take by mouth.     No current facility-administered medications on file prior to visit.    Allergies:   Losartan; Anesthetics, amide; Benadryl [diphenhydramine hcl (sleep)]; Benicar [olmesartan]; Cardura [doxazosin mesylate]; Fentanyl; Losartan potassium-hctz; Metformin and related; Other; Prilocaine; and Semaglutide   Social History   Tobacco Use   Smoking status: Never   Smokeless tobacco: Never  Vaping Use   Vaping status: Never Used  Substance Use Topics   Alcohol use: Yes    Comment: very rare - glass red wine when that    Drug use: No     Family Hx: The patient's family history includes Cancer in her maternal grandfather; Colon cancer in her maternal grandfather; Diabetes in her father and mother; Heart disease in her father; Hypertension in her father and mother; Kidney disease in her father. There is no history of Colon polyps, Breast cancer, Esophageal cancer, Rectal cancer, or Stomach cancer.  ROS:   Please see the history of present illness.    Review of Systems  Constitutional: Negative.   HENT: Negative.    Respiratory: Negative.    Cardiovascular: Negative.   Gastrointestinal: Negative.   Musculoskeletal:  Positive for joint pain.        Shoulder pain  Neurological: Negative.   Psychiatric/Behavioral: Negative.    All other systems reviewed and are negative.   Labs/Other Tests and Data Reviewed:    Recent Labs: 02/07/2023: ALT 16; Hemoglobin 13.2; Platelets 156 02/12/2023: Magnesium 2.4; TSH 1.310 04/05/2023: BUN 18; Creatinine, Ser 0.82; Potassium 4.1; Sodium 143   Recent Lipid Panel Lab Results  Component Value Date/Time   CHOL 181 08/23/2022 09:50 AM   TRIG 52 08/23/2022 09:50 AM   HDL 58 08/23/2022 09:50 AM   CHOLHDL 3.1 08/23/2022 09:50 AM   CHOLHDL 3 08/27/2015 08:06 AM   LDLCALC 113 (H) 08/23/2022 09:50 AM   LDLDIRECT 113.0 09/16/2014 12:08 PM    Wt Readings from Last 3 Encounters:  04/30/23 194 lb 6 oz (88.2 kg)  04/19/23 191 lb (86.6 kg)  04/05/23 195 lb (88.5 kg)  Exam:    Vital Signs: Vital signs may also be detailed in the HPI BP 118/70 (BP Location: Left Arm, Patient Position: Sitting, Cuff Size: Normal)   Pulse 60   Ht 5\' 5"  (1.651 m)   Wt 194 lb 6 oz (88.2 kg)   LMP 12/01/2013 (Approximate)   SpO2 97%   BMI 32.35 kg/m   Constitutional:  oriented to person, place, and time. No distress.  HENT:  Head: Grossly normal Eyes:  no discharge. No scleral icterus.  Neck: No JVD, no carotid bruits  Cardiovascular: Regular rate and rhythm, no murmurs appreciated Pulmonary/Chest: Clear to auscultation bilaterally, no wheezes or rails Abdominal: Soft.  no distension.  no tenderness.  Musculoskeletal: Normal range of motion Neurological:  normal muscle tone. Coordination normal. No atrophy Skin: Skin warm and dry Psychiatric: normal affect, pleasant  ASSESSMENT & PLAN:    Essential hypertension, benign Blood pressure well-controlled, long discussion concerning Bystolic Without beta-blocker would likely have recurrence of her tachypalpitations Alternate beta-blocker could be tried She prefers no change at this time as she is doing so well  Leg swelling Minimal swelling, tolerating  amlodipine 2.5 daily  anxiety Managed by primary care Exercising on a regular basis  Dizziness Chronic issue, unrelated to hypertension Better off HCTZ  Chest pain Atypical in nature, musculoskeletal pain, chest wall pain Symptoms well-controlled  Palpitations For now she will continue Bystolic 5 twice daily    Signed, Julien Nordmann, MD  04/30/2023 3:38 PM    [facility] [address], [place], [place] [address]

## 2023-04-30 ENCOUNTER — Encounter: Payer: Self-pay | Admitting: Cardiovascular Disease

## 2023-04-30 ENCOUNTER — Ambulatory Visit
Payer: No Typology Code available for payment source | Attending: Cardiovascular Disease | Admitting: Cardiovascular Disease

## 2023-04-30 VITALS — BP 118/70 | HR 60 | Ht 65.0 in | Wt 194.4 lb

## 2023-04-30 DIAGNOSIS — R079 Chest pain, unspecified: Secondary | ICD-10-CM | POA: Diagnosis not present

## 2023-04-30 DIAGNOSIS — E782 Mixed hyperlipidemia: Secondary | ICD-10-CM

## 2023-04-30 DIAGNOSIS — R600 Localized edema: Secondary | ICD-10-CM

## 2023-04-30 DIAGNOSIS — I10 Essential (primary) hypertension: Secondary | ICD-10-CM | POA: Diagnosis not present

## 2023-04-30 NOTE — Patient Instructions (Signed)

## 2023-05-02 ENCOUNTER — Ambulatory Visit
Admission: RE | Admit: 2023-05-02 | Discharge: 2023-05-02 | Disposition: A | Discharge status: Home / Self Care | Payer: No Typology Code available for payment source | Source: Ambulatory Visit | Attending: Obstetrics and Gynecology | Admitting: Obstetrics and Gynecology

## 2023-05-02 DIAGNOSIS — N644 Mastodynia: Secondary | ICD-10-CM

## 2023-05-07 ENCOUNTER — Telehealth: Payer: Self-pay | Admitting: Cardiovascular Disease

## 2023-05-07 MED ORDER — NEBIVOLOL HCL 10 MG PO TABS: 10.0000 mg | ORAL_TABLET | Freq: Every day | ORAL | 3 refills | Status: DC

## 2023-05-07 NOTE — Telephone Encounter (Signed)
Spoke with patient and advised that prescription has been updated and sent to pharmacy. She verbalized understanding with no further questions at this time.

## 2023-05-07 NOTE — Telephone Encounter (Signed)
Pt c/o medication issue:  1. Name of Medication: Bystolic  2. How are you currently taking this medication (dosage and times per day)?   3. Are you having a reaction (difficulty breathing--STAT)?   4. What is your medication issue? Patient said the generic was called in, she does not take the generic. She takes Bystolic only. . She says this  keeps happening with this medicine. She said she is out of her medicine, and the pharmacy does not have it in. Please send the new prescription asap- she is completely out of it

## 2023-05-07 NOTE — Addendum Note (Signed)
Addended by: Bryna Colander on: 05/07/2023 12:01 PM   Modules accepted: Orders

## 2023-05-09 ENCOUNTER — Telehealth: Payer: Self-pay | Admitting: Cardiovascular Disease

## 2023-05-09 NOTE — Telephone Encounter (Signed)
The patient called stating she is having issues getting her amlodipine prescription filled at the pharmacy. She mentioned that she has been taking amlodipine 2.5 mg BID since September; however, during her last office visit on 04/30/23, Dr. Mariah Milling recommended continuing amlodipine 2.5 mg daily. The patient stated that taking 2.5 mg BID has been effective in keeping her blood pressure under control. She is very concerned as she leaves for an international trip on Monday and needs an updated prescription sent to the pharmacy before then to avoid running out of medication.  I will forward this to Dr. Mariah Milling for approval.

## 2023-05-09 NOTE — Telephone Encounter (Signed)
Patient called and said that pharmacy would not refill her medication amLODipine (NORVASC) 2.5 MG tablet . Patient desperately need medication due to leaving country on Monday and is totally out.

## 2023-05-11 ENCOUNTER — Telehealth: Payer: Self-pay | Admitting: Cardiovascular Disease

## 2023-05-11 MED ORDER — AMLODIPINE BESYLATE 2.5 MG PO TABS: 2.5000 mg | ORAL_TABLET | Freq: Every day | ORAL | 3 refills | Status: DC

## 2023-05-11 NOTE — Telephone Encounter (Signed)
Pt returning call to nurse

## 2023-05-11 NOTE — Telephone Encounter (Signed)
Called and spoke with patient. Notified her of the following from Dr. Mariah Milling.  We can send in amlodipine 2.5 daily with extra 2.5 mg for pressure over 140 systolic #180, with refills If insurance will not fill, she can pay cash and that should still be cheap (do not run it through insurance if they have trouble filling) Thx TGollan   Prescription sent to patient's preferred pharmacy. Patient verbalizes understanding.

## 2023-05-11 NOTE — Telephone Encounter (Signed)
Called patient and left a message with the following from Dr. Mariah Milling.  We can send in amlodipine 2.5 daily with extra 2.5 mg for pressure over 140 systolic #180, with refills If insurance will not fill, she can pay cash and that should still be cheap (do not run it through insurance if they have trouble filling) Thx TGollan

## 2023-06-17 ENCOUNTER — Other Ambulatory Visit: Payer: Self-pay | Admitting: Physician Assistant

## 2023-07-06 ENCOUNTER — Ambulatory Visit
Admission: EM | Admit: 2023-07-06 | Discharge: 2023-07-06 | Disposition: A | Discharge status: Home / Self Care | Payer: Commercial Managed Care - PPO

## 2023-07-06 DIAGNOSIS — J09X2 Influenza due to identified novel influenza A virus with other respiratory manifestations: Secondary | ICD-10-CM

## 2023-07-06 DIAGNOSIS — E162 Hypoglycemia, unspecified: Secondary | ICD-10-CM | POA: Insufficient documentation

## 2023-07-06 DIAGNOSIS — E669 Obesity, unspecified: Secondary | ICD-10-CM | POA: Insufficient documentation

## 2023-07-06 DIAGNOSIS — I10 Essential (primary) hypertension: Secondary | ICD-10-CM | POA: Insufficient documentation

## 2023-07-06 LAB — POC COVID19/FLU A&B COMBO
Covid Antigen, POC: NEGATIVE
Influenza A Antigen, POC: POSITIVE — AB
Influenza B Antigen, POC: NEGATIVE

## 2023-07-06 MED ORDER — ACETAMINOPHEN 325 MG PO TABS
650.0000 mg | ORAL_TABLET | Freq: Once | ORAL | Status: AC
  Administered 2023-07-06: 650 mg via ORAL

## 2023-07-06 MED ORDER — BENZONATATE 200 MG PO CAPS: 200.0000 mg | ORAL_CAPSULE | Freq: Three times a day (TID) | ORAL | 0 refills | Status: DC | PRN

## 2023-07-06 MED ORDER — OSELTAMIVIR PHOSPHATE 75 MG PO CAPS: 75.0000 mg | ORAL_CAPSULE | Freq: Two times a day (BID) | ORAL | 0 refills | Status: DC

## 2023-07-06 NOTE — ED Provider Notes (Signed)
[name]    CSN: [id number] Arrival date & time: 07/06/23  1124      History   Chief Complaint Chief Complaint  Patient presents with   Fever   Fatigue   Generalized Body Aches   Cough    HPI Nicole Gould is a 63 y.o. female she developed bad ST, and yesterday HA, body aches, cough, rhinitis, fever up to 103. Has not had a flu shot this year.     Past Medical History:  Diagnosis Date   Allergy    Anxiety    Chicken pox    Chronic Dizziness    Colon polyps    Concussion    Frequent headaches    GERD (gastroesophageal reflux disease)    on meds   History of stress test    a. 2005 - in setting of ectopy - reportedly nl.   Hypertension    a. 03/2018 24hr ABM: Mean BP 146/75, mean HR 61; Mean awake BP/HR 150/78, 62; Mean sleep BP/HR 130/65, 58.   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   Hypokalemia 02/13/2023   Abnormal finding on mammography 10/04/2022   Lumbar radiculopathy 05/17/2020   Mild sleep apnea 03/31/2020   Postconcussive syndrome 01/23/2020   Gastroesophageal reflux disease 08/30/2019   Hyperlipidemia 06/26/2019   Dysesthesia 06/24/2019   Dry mouth, unspecified 06/24/2019   Vaccine counseling 06/24/2019   Class 1 obesity with serious comorbidity and body mass index (BMI) of 32.0 to 32.9 in adult 05/06/2019   Insomnia due to stress, anxiety and fear 06/06/2018   Noncompliance with diet and medication regimen 04/21/2018   Mood disorder (HCC) 03/07/2018   Polyp of corpus uteri 02/08/2018   Hx of  seizure disorder 11/10/2017   Intractable migraine without aura and without status migrainosus 11/10/2017   Prediabetes 11/10/2017   Spells of trembling 11/10/2017   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   Reactive hypoglycemia 03/25/2017   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   Dizziness 10/08/2016   Obesity with body mass index 30 or greater 08/09/2016   History of adenomatous polyp of colon 08/04/2016   Seasonal and perennial allergic rhinoconjunctivitis 07/12/2016   Chronic suprapubic pain 05/30/2015   Menopausal symptom 10/25/2014   Primary insomnia 05/05/2014   Anxiety 05/05/2014   Chronic venous insufficiency 04/21/2014   Adenomatous polyp of colon 05/12/2013   Pain 03/11/2013   Essential hypertension, benign 01/28/2013   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      Home Medications    Prior to Admission medications   Medication Sig Start Date End Date Taking? Authorizing Provider  acetaminophen (  TYLENOL) 500 MG tablet Take 1,000 mg by mouth every 6 (six) hours as needed.   Yes [provider]  amLODipine (NORVASC) 2.5 MG tablet Take 1 tablet (2.5 mg total) by mouth daily. May take an extra tablet (2.5 MG) daily as needed for systolic blood pressure greater then 140. 05/11/23  Yes Gollan, Tollie Pizza, MD  benzonatate (TESSALON) 200 MG capsule Take 1 capsule (200 mg total) by mouth 3 (three) times daily as needed for cough. 07/06/23  Yes Rodriguez-Southworth, Nettie Elm, PA-C  Homeopathic Products [facility] COLD REMEDY PO) Take by mouth.   Yes [provider]  nebivolol (BYSTOLIC) 10 MG tablet Take 1 tablet (10 mg total) by mouth daily. 05/07/23  Yes Antonieta Iba, MD  oseltamivir (TAMIFLU) 75 MG capsule Take 1 capsule (75 mg total) by mouth every 12 (twelve) hours. 07/06/23  Yes Rodriguez-Southworth, Nettie Elm, PA-C  Apple Cider Vinegar 500 MG TABS Take 500 mg by mouth daily.    [provider]  azelastine (OPTIVAR) 0.05 % ophthalmic solution Place 1 drop into both eyes 2 (two) times daily as needed (itchy/watery eyes). 11/10/19   Ellamae Sia, DO  betamethasone, augmented, (DIPROLENE) 0.05 % lotion Apply 1 application. topically 2 (two) times daily as needed. 08/18/21   [provider]  Bioflavonoid Products (VITAMIN C) CHEW Chew 1 tablet by mouth daily.    [provider]  Biotin 1000 MCG tablet Take 1,000 mcg by mouth daily.    [provider]  Cholecalciferol 50 MCG (2000 UT) TABS Take 1 tablet by mouth daily.    [provider]  CINNAMON PO Take 1,000 mg by mouth daily.     [provider]  fluticasone (FLONASE) 50 MCG/ACT nasal spray Place 1 spray into both nostrils daily. 09/29/22   Marcine Matar, MD  fluticasone (FLONASE) 50 MCG/ACT nasal spray Place 1 spray into both nostrils daily. 09/29/22   [provider]  glucose blood test strip See admin instructions. 08/17/21   [provider]  glucose blood test strip AS DIRECTED IN VITRO ONCE A DAY 90 DAYS    [provider]  hyoscyamine (LEVSIN SL) 0.125 MG SL tablet Take 0.125 mg by mouth every 4 (four) hours as needed for cramping.    [provider]  Lancet Devices [facility] DELICA PLUS LANCING) MISC CHECK BLOOD SUGAR ONCE A DAY 90 DAYS    [provider]  Lancets (ONETOUCH DELICA PLUS LANCET33G) MISC SMARTSIG:1 Topical Daily 08/26/21   [provider]  loratadine (CLARITIN) 10 MG tablet Take 1 tablet (10 mg total) by mouth daily. 09/29/22   Marcine Matar, MD  loratadine (CLARITIN) 10 MG tablet Take 1 tablet by mouth daily.    [provider]  meclizine (ANTIVERT) 25 MG tablet  Take 25 mg by mouth 3 (three) times daily as needed.    [provider]  minoxidil (LONITEN) 2.5 MG tablet Take 2.5 mg by mouth as directed.  Take 1/2 tab by mouth daily 04/26/23   [provider]  Multiple Vitamins-Minerals (MULTIVITAMIN WITH MINERALS) tablet Take 1 tablet by mouth daily.    [provider]  Multiple Vitamins-Minerals (OCUVITE EXTRA PO) Take by mouth daily.    [provider]  Omega-3 Fatty Acids (FISH OIL) 300 MG CAPS Take 300 mg by mouth 3 (three) times daily.    [provider]  ondansetron (ZOFRAN-ODT) 8 MG disintegrating tablet Take 8 mg by mouth every 8 (eight) hours as needed.  [provider]  [employer] VERIO test strip 1 each daily. 08/26/21   [provider]  pantoprazole (PROTONIX) 40 MG tablet TAKE 1 TABLET BY MOUTH EVERY DAY 06/18/23   Unk Lightning, PA  pantoprazole (PROTONIX) 40 MG tablet Take 40 mg by mouth daily.    [provider]  potassium chloride SA (KLOR-CON M20) 20 MEQ tablet Take 20 mEq by mouth daily.    [provider]  Semaglutide-Weight Management (WEGOVY) 0.25 MG/0.5ML SOAJ Inject 0.25 mg into the skin once a week.    [provider]  sucralfate (CARAFATE) 1 g tablet Take 1 g by mouth 4 (four) times daily.    [provider]  tretinoin (RETIN-A) 0.025 % cream Apply 1 Application topically at bedtime.    [provider]  Turmeric (QC TUMERIC COMPLEX PO) Take by mouth.    [provider]  Turmeric POWD Take 1 tablet by mouth See admin instructions.    [provider]    Family History Family History  Problem Relation Age of Onset   Hypertension Mother    Diabetes Mother    Heart disease Father    Kidney disease Father    Hypertension Father    Diabetes Father    Cancer Maternal Grandfather        colon cancer   Colon cancer Maternal Grandfather        passed age 16   Colon polyps Neg Hx    Breast cancer Neg  Hx    Esophageal cancer Neg Hx    Rectal cancer Neg Hx    Stomach cancer Neg Hx     Social History Social History   Tobacco Use   Smoking status: Never    Passive exposure: Never   Smokeless tobacco: Never  Vaping Use   Vaping status: Never Used  Substance Use Topics   Alcohol use: Yes    Comment: very rare - glass red wine when that    Drug use: No     Allergies   Losartan; Anesthetics, amide; Benadryl [diphenhydramine hcl (sleep)]; Benicar [olmesartan]; Cardura [doxazosin mesylate]; Diphenhydramine; Fentanyl; Hydrochlorothiazide; Losartan potassium-hctz; Metformin and related; Other; Prilocaine; and Semaglutide   Review of Systems Review of Systems As noted in HPI  Physical Exam Triage Vital Signs ED Triage Vitals  Encounter Vitals Group     BP --      Systolic BP Percentile --      Diastolic BP Percentile --      Pulse --      Resp --      Temp --      Temp src --      SpO2 --      Weight 07/06/23 1451 194 lb 7.1 oz (88.2 kg)     Height 07/06/23 1451 5\' 5"  (1.651 m)     Head Circumference --      Peak Flow --      Pain Score 07/06/23 1446 3     Pain Loc --      Pain Education --      Exclude from Growth Chart --    No data found.  Updated Vital Signs BP (!) 157/78 (BP Location: Left Arm) Comment: "My head has been really banging, you can recheck later if needed".  Pulse 79   Temp (!) 102.9 F (39.4 C) (Oral)   Resp 18   Ht 5\' 5"  (1.651 m)   Wt 194 lb 7.1 oz (88.2 kg)   LMP 12/01/2013 (  Approximate)   SpO2 96%   BMI 32.36 kg/m   Visual Acuity Right Eye Distance:   Left Eye Distance:   Bilateral Distance:    Right Eye Near:   Left Eye Near:    Bilateral Near:     Physical Exam  Physical Exam Vitals signs and nursing note reviewed.  Constitutional:      General: She is not in acute distress.    Appearance: Normal appearance. She is  ill-appearing, but not toxic-appearing or diaphoretic.  HENT:     Head: Normocephalic.     Right Ear:  Tympanic membrane, ear canal and external ear normal.     Left Ear: Tympanic membrane, ear canal and external ear normal.     Nose: Nose normal.     Mouth/Throat:     Mouth: Mucous membranes are moist.  Eyes:     General: No scleral icterus.       Right eye: No discharge.        Left eye: No discharge.     Conjunctiva/sclera: Conjunctivae normal.  Neck:     Musculoskeletal: Neck supple. No neck rigidity.  Cardiovascular:     Rate and Rhythm: Normal rate and regular rhythm.     Heart sounds: No murmur.  Pulmonary:     Effort: Pulmonary effort is normal.     Breath sounds: Normal breath sounds.   Musculoskeletal: Normal range of motion.  Lymphadenopathy:     Cervical: No cervical adenopathy.  Skin:    General: Skin is warm and dry.     Coloration: Skin is not jaundiced.     Findings: No rash.  Neurological:     Mental Status: She is alert and oriented to person, place, and time.     Gait: Gait normal.  Psychiatric:        Mood and Affect: Mood normal.        Behavior: Behavior normal.        Thought Content: Thought content normal.        Judgment: Judgment normal.   UC Treatments / Results  Labs (all labs ordered are listed, but only abnormal results are displayed) Labs Reviewed  POC COVID19/FLU A&B COMBO - Abnormal; Notable for the following components:      Result Value   Influenza A Antigen, POC Positive (*)    All other components within normal limits   Fu B and Covid test are negative EKG   Radiology No results found.  Procedures Procedures (including critical care time)  Medications Ordered in UC Medications  acetaminophen (TYLENOL) tablet 650 mg (650 mg Oral Given 07/06/23 1501)    Initial Impression / Assessment and Plan / UC Course  I have reviewed the triage vital signs and the nursing notes.  Pertinent labs  results that were available during my care of the patient were reviewed by me and considered in my medical decision making (see chart for  details).  Influenza A  Placed on Tamiflu and Tessalon as noted    Final Clinical Impressions(s) / UC Diagnoses   Final diagnoses:  Influenza due to identified novel influenza A virus with other respiratory manifestations   Discharge Instructions   None    ED Prescriptions     Medication Sig Dispense Auth. Provider   oseltamivir (TAMIFLU) 75 MG capsule Take 1 capsule (75 mg total) by mouth every 12 (twelve) hours. 10 capsule Rodriguez-Southworth, Nettie Elm, PA-C   benzonatate (TESSALON) 200 MG capsule Take 1 capsule (200 mg total) by mouth  3 (three) times daily as needed for cough. 30 capsule Rodriguez-Southworth, Nettie Elm, PA-C      PDMP not reviewed this encounter.   Garey Ham, [place] 07/06/23 1844

## 2023-07-06 NOTE — ED Triage Notes (Signed)
"  This started after train ride to [place] (Left on 10th and came back on 13th) even though I wore a mask up but not on my way back". "I have noticed a scab in my nose, a lot of congestion, sore throat". After a few days of OTC treatment "it helped some, Fever up to 103, dizziness (increasing) at times, body aches/chills". Home COVID19 test Negative.

## 2023-07-09 ENCOUNTER — Telehealth: Payer: Self-pay

## 2023-07-09 NOTE — Telephone Encounter (Signed)
Patient called requesting an antibiotic. Patient was here on 07/06/2023 and tested positive for Flu A. I spoke with Nettie Elm regarding this patient and she recommended that the patient to return if concerned with a secondary infection so that pneumonia could be ruled out. Upon speaking with the patient, she states that she is feeling better with the medication prescribed but she has been coughing up green phlegm but its only in the mornings upon waking. I informed the patient what Nettie Elm stated and she voiced understanding.

## 2023-08-03 ENCOUNTER — Ambulatory Visit: Payer: No Typology Code available for payment source | Admitting: Allergy

## 2023-08-15 ENCOUNTER — Encounter: Payer: Self-pay | Admitting: Allergy

## 2023-08-15 ENCOUNTER — Other Ambulatory Visit: Payer: Self-pay | Admitting: Allergy

## 2023-08-15 ENCOUNTER — Ambulatory Visit (INDEPENDENT_AMBULATORY_CARE_PROVIDER_SITE_OTHER): Payer: Medicare Other | Admitting: Allergy

## 2023-08-15 ENCOUNTER — Other Ambulatory Visit: Payer: Self-pay

## 2023-08-15 VITALS — BP 134/78 | HR 76 | Temp 98.1°F | Ht 64.0 in | Wt 196.0 lb

## 2023-08-15 DIAGNOSIS — R052 Subacute cough: Secondary | ICD-10-CM

## 2023-08-15 DIAGNOSIS — J3089 Other allergic rhinitis: Secondary | ICD-10-CM

## 2023-08-15 DIAGNOSIS — J302 Other seasonal allergic rhinitis: Secondary | ICD-10-CM

## 2023-08-15 DIAGNOSIS — H04122 Dry eye syndrome of left lacrimal gland: Secondary | ICD-10-CM | POA: Diagnosis not present

## 2023-08-15 MED ORDER — RYALTRIS 665-25 MCG/ACT NA SUSP: 2.0000 | Freq: Two times a day (BID) | NASAL | 5 refills | Status: AC

## 2023-08-15 MED ORDER — LEVOCETIRIZINE DIHYDROCHLORIDE 5 MG PO TABS: 5.0000 mg | ORAL_TABLET | Freq: Every evening | ORAL | 5 refills | Status: DC

## 2023-08-15 NOTE — Progress Notes (Signed)
 New Patient Note  RE: Nicole Gould MRN: [id number] DOB: [birth year] Date of Office Visit: 08/15/2023  Primary care provider: Sigmund Hazel, MD  Chief Complaint: Allergies  History of present illness: Nicole Gould is a 63 y.o. female presenting today for evaluation of allergic rhinitis with conjunctivitis.  She is a former patient of the practice last seeing Dr. Selena Batten on 11/10/2019 with previous allergy testing positive to cockroach, grass pollen, tree pollen, weed pollen. Discussed the use of AI scribe software for clinical note transcription with the patient, who gave verbal consent to proceed.  She developed symptoms a few days after returning from a trip to [place], [place]. in January. Initially, she had a sore throat and self-medicated with over-the-counter remedies such as zinc and Zicam. Her symptoms progressed, leading to a flu diagnosis that lasted about a month. Despite antibiotic treatment, she continues to experience a morning cough and congestion.  She frequently uses a neti pot to manage thick mucus during this illness.   She has a history of chronic mucus production and nasal congestion, describing herself as a 'mouth breather' due to nasal blockage. She has allergies to pollen and cockroaches and has previously received allergy shots when she was much younger. Her symptoms worsen with weather changes and pollen exposure.  She experiences chronic tearing of her eyes, particularly the left one, initially attributing it to makeup use. Allergy-based eye drops were more effective for itchy eyes than the current tearing issue. She suspects dry eye syndrome, as suggested by an optical specialist.  She has has used both Xyzal and loratadine, which she finds eating ineffective.  The Xyzal is effective but it does make her drowsy as she takes it at night.  She also uses a nasal spray and eye drops as needed. She previously used an Dymista nasal spray but recalls it having a nasty  taste.  In terms of social history, she is a outside walker but has not been able to walk recently due to illness and travel, leading to weight gain.      Review of systems: 10pt ROS negative unless noted above in HPI  All other systems negative unless noted above in HPI  Past medical history: Past Medical History:  Diagnosis Date   Allergy    Anxiety    Chicken pox    Chronic Dizziness    Colon polyps    Concussion    Frequent headaches    GERD (gastroesophageal reflux disease)    on meds   History of stress test    a. 2005 - in setting of ectopy - reportedly nl.   Hypertension    a. 03/2018 24hr ABM: Mean BP 146/75, mean HR 61; Mean awake BP/HR 150/78, 62; Mean sleep BP/HR 130/65, 58.   Migraines    OSA (obstructive sleep apnea)    No Cpap at this time   Pre-diabetes    Pre-eclampsia    with grand mal seizures   Seasonal allergies    Seizure disorder in pregnancy (HCC)    Seizures (HCC)    with pregnancy 1981- pre clampsia     Past surgical history: Past Surgical History:  Procedure Laterality Date   COLONOSCOPY  2018   Pyrtle-MAC-suprep(exc)-HPP   DILATION AND CURETTAGE OF UTERUS  09/08/2014   with hystereoscopy   POLYPECTOMY  2018   HPP   TONSILLECTOMY     TUBAL LIGATION      Family history:  Family History  Problem Relation Age of Onset  Hypertension Mother    Diabetes Mother    Heart disease Father    Kidney disease Father    Hypertension Father    Diabetes Father    Cancer Maternal Grandfather        colon cancer   Colon cancer Maternal Grandfather        passed age 37   Colon polyps Neg Hx    Breast cancer Neg Hx    Esophageal cancer Neg Hx    Rectal cancer Neg Hx    Stomach cancer Neg Hx     Social history: Lives in a home without carpeting with gas heating and central cooling.  No pets in the home.  There is no concern for water damage, mildew or roaches in the home.  She is retired from Ambulance person.  She denies a smoking  history.   Medication List: Current Outpatient Medications  Medication Sig Dispense Refill   amLODipine (NORVASC) 2.5 MG tablet Take 1 tablet (2.5 mg total) by mouth daily. May take an extra tablet (2.5 MG) daily as needed for systolic blood pressure greater then 140. 180 tablet 3   Apple Cider Vinegar 500 MG TABS Take 500 mg by mouth daily.     azelastine (OPTIVAR) 0.05 % ophthalmic solution Place 1 drop into both eyes 2 (two) times daily as needed (itchy/watery eyes). 6 mL 5   betamethasone, augmented, (DIPROLENE) 0.05 % lotion Apply 1 application. topically 2 (two) times daily as needed.     Bioflavonoid Products (VITAMIN C) CHEW Chew 1 tablet by mouth daily.     Biotin 1000 MCG tablet Take 1,000 mcg by mouth daily.     Cholecalciferol 50 MCG (2000 UT) TABS Take 1 tablet by mouth daily.     CINNAMON PO Take 1,000 mg by mouth daily.      fluticasone (FLONASE) 50 MCG/ACT nasal spray Place 1 spray into both nostrils daily. 9.9 g 6   glucose blood test strip See admin instructions.     Lancet Devices (ONETOUCH DELICA PLUS LANCING) MISC CHECK BLOOD SUGAR ONCE A DAY 90 DAYS     loratadine (CLARITIN) 10 MG tablet Take 1 tablet (10 mg total) by mouth daily. 30 tablet 11   Multiple Vitamins-Minerals (MULTIVITAMIN WITH MINERALS) tablet Take 1 tablet by mouth daily.     nebivolol (BYSTOLIC) 10 MG tablet Take 1 tablet (10 mg total) by mouth daily. 90 tablet 3   Omega-3 Fatty Acids (FISH OIL) 300 MG CAPS Take 300 mg by mouth 3 (three) times daily.     ONETOUCH VERIO test strip 1 each daily.     pantoprazole (PROTONIX) 40 MG tablet TAKE 1 TABLET BY MOUTH EVERY DAY 90 tablet 0   sucralfate (CARAFATE) 1 g tablet Take 1 g by mouth 4 (four) times daily.     tretinoin (RETIN-A) 0.025 % cream Apply 1 Application topically at bedtime.     Turmeric (QC TUMERIC COMPLEX PO) Take by mouth.     acetaminophen (TYLENOL) 500 MG tablet Take 1,000 mg by mouth every 6 (six) hours as needed. (Patient not taking:  Reported on 08/15/2023)     benzonatate (TESSALON) 200 MG capsule Take 1 capsule (200 mg total) by mouth 3 (three) times daily as needed for cough. (Patient not taking: Reported on 08/15/2023) 30 capsule 0   fluticasone (FLONASE) 50 MCG/ACT nasal spray Place 1 spray into both nostrils daily.     glucose blood test strip AS DIRECTED IN VITRO ONCE A DAY 90 DAYS  Homeopathic Products (ZICAM COLD REMEDY PO) Take by mouth. (Patient not taking: Reported on 08/15/2023)     hyoscyamine (LEVSIN SL) 0.125 MG SL tablet Take 0.125 mg by mouth every 4 (four) hours as needed for cramping. (Patient not taking: Reported on 08/15/2023)     Lancets (ONETOUCH DELICA PLUS LANCET33G) MISC SMARTSIG:1 Topical Daily     loratadine (CLARITIN) 10 MG tablet Take 1 tablet by mouth daily. (Patient not taking: Reported on 08/15/2023)     meclizine (ANTIVERT) 25 MG tablet Take 25 mg by mouth 3 (three) times daily as needed. (Patient not taking: Reported on 08/15/2023)     minoxidil (LONITEN) 2.5 MG tablet Take 2.5 mg by mouth as directed.  Take 1/2 tab by mouth daily (Patient not taking: Reported on 08/15/2023)     Multiple Vitamins-Minerals (OCUVITE EXTRA PO) Take by mouth daily. (Patient not taking: Reported on 08/15/2023)     ondansetron (ZOFRAN-ODT) 8 MG disintegrating tablet Take 8 mg by mouth every 8 (eight) hours as needed. (Patient not taking: Reported on 08/15/2023)     oseltamivir (TAMIFLU) 75 MG capsule Take 1 capsule (75 mg total) by mouth every 12 (twelve) hours. (Patient not taking: Reported on 08/15/2023) 10 capsule 0   pantoprazole (PROTONIX) 40 MG tablet Take 40 mg by mouth daily. (Patient not taking: Reported on 08/15/2023)     potassium chloride SA (KLOR-CON M20) 20 MEQ tablet Take 20 mEq by mouth daily. (Patient not taking: Reported on 08/15/2023)     Semaglutide-Weight Management (WEGOVY) 0.25 MG/0.5ML SOAJ Inject 0.25 mg into the skin once a week. (Patient not taking: Reported on 08/15/2023)     Turmeric POWD Take 1  tablet by mouth See admin instructions. (Patient not taking: Reported on 08/15/2023)     No current facility-administered medications for this visit.    Known medication allergies: Allergies  Allergen Reactions   Losartan Shortness Of Breath    Dizziness,  Shortness of breath    Anesthetics, Amide Nausea And Vomiting   Benadryl [Diphenhydramine Hcl (Sleep)]     Made feel paralyzed when had benadryl with a cocktail for procedure  Decadron, bendayl, regaln combo at [facility] 2014- see care every where    Benicar [Olmesartan]     Presyncopal , light headed    Cardura [Doxazosin Mesylate] Other (See Comments)    "makes me feel terrible"   Diphenhydramine     Other Reaction(s): per patient with something else   , out of body experience   Fentanyl Hypertension    Severe htn    Hydrochlorothiazide     Other Reaction(s): dizziness   Losartan Potassium-Hctz     Dizziness,  Shortness of breath    Metformin And Related     "out of body experience"     Other     WEGOVY   Prilocaine Nausea And Vomiting   Semaglutide Other (See Comments)    , Pain in the chest and dizziness     Physical examination: Blood pressure 134/78, pulse 76, temperature 98.1 F (36.7 C), temperature source Temporal, height 5\' 4"  (1.626 m), weight 196 lb (88.9 kg), last menstrual period 12/01/2013, SpO2 98%.  General: Alert, interactive, in no acute distress. HEENT: PERRLA, TMs pearly gray, turbinates moderately edematous with clear discharge, post-pharynx non erythematous. Neck: Supple without lymphadenopathy. Lungs: Clear to auscultation without wheezing, rhonchi or rales. {no increased work of breathing. CV: Normal S1, S2 without murmurs. Abdomen: Nondistended, nontender. Skin: Warm and dry, without lesions or rashes. Extremities:  No clubbing,  cyanosis or edema. Neuro:   Grossly intact.  Diagnositics/Labs:  Spirometry: FEV1: 1.77 L 90%, FVC: 2.23 L 90%, ratio consistent with nonobstructive  pattern  Assessment and plan: Allergic rhinitis  Cough secondary to recent illness Dry eye  Continue avoidance measures for grass pollen, tree pollen, ragweed, weed pollen, cockroaches.  Provided with avoidance measures.    Use Ryaltris (mometasone for congestion control + olopatadine for drainage control)  nasal spray combination) 2 sprays per nostril twice a day as needed.  With using nasal sprays point tip of bottle toward eye on same side nostril and lean head slightly forward for best technique.   Use saline rinse kit prior to nasal spray use.  Use distilled water or boil water and bring to room temperature (do not use tap water). Keep mouth open during rinse.  Contineu Xyzal 5mg  at night and see if it helps with allergy symptom control For dry eye symptoms (tearing) can use lubricating eyedrop like Systane or artificial tears Lung function testing is normal  Follow up in 3-4 months or sooner if needed.   I appreciate the opportunity to take part in Nicole Gould's care. Please do not hesitate to contact me with questions.  Sincerely,   Margo Aye, MD Allergy/Immunology [facility]

## 2023-08-15 NOTE — Patient Instructions (Addendum)
 Continue avoidance measures for grass pollen, tree pollen, ragweed, weed pollen, cockroaches.  Provided with avoidance measures.    Use Ryaltris (mometasone for congestion control + olopatadine for drainage control)  nasal spray combination) 2 sprays per nostril twice a day as needed.  With using nasal sprays point tip of bottle toward eye on same side nostril and lean head slightly forward for best technique.   Use saline rinse kit prior to nasal spray use.  Use distilled water or boil water and bring to room temperature (do not use tap water). Keep mouth open during rinse.  Contineu Xyzal 5mg  at night and see if it helps with allergy symptom control For dry eye symptoms (tearing) can use lubricating eyedrop like Systane or artificial tears  Follow up in 3-4 months or sooner if needed.   Cockroach Allergen Avoidance Cockroaches are often found in the homes of densely populated urban areas, schools or commercial buildings, but these creatures can lurk almost anywhere. This does not mean that you have a dirty house or living area. Block all areas where roaches can enter the home. This includes crevices, wall cracks and windows.  Cockroaches need water to survive, so fix and seal all leaky faucets and pipes. Have an exterminator go through the house when your family and pets are gone to eliminate any remaining roaches. Keep food in lidded containers and put pet food dishes away after your pets are done eating. Vacuum and sweep the floor after meals, and take out garbage and recyclables. Use lidded garbage containers in the kitchen. Wash dishes immediately after use and clean under stoves, refrigerators or toasters where crumbs can accumulate. Wipe off the stove and other kitchen surfaces and cupboards regularly.  Reducing Pollen Exposure Pollen seasons: trees (spring), grass (summer) and ragweed/weeds (fall). Keep windows closed in your home and car to lower pollen exposure.  Install air  conditioning in the bedroom and throughout the house if possible.  Avoid going out in dry windy days - especially early morning. Pollen counts are highest between 5 - 10 AM and on dry, hot and windy days.  Save outside activities for late afternoon or after a heavy rain, when pollen levels are lower.  Avoid mowing of grass if you have grass pollen allergy. Be aware that pollen can also be transported indoors on people and pets.  Dry your clothes in an automatic dryer rather than hanging them outside where they might collect pollen.  Rinse hair and eyes before bedtime.

## 2023-08-16 ENCOUNTER — Other Ambulatory Visit (HOSPITAL_COMMUNITY): Payer: Self-pay

## 2023-08-16 ENCOUNTER — Telehealth: Payer: Self-pay | Admitting: Cardiovascular Disease

## 2023-08-16 ENCOUNTER — Telehealth: Payer: Self-pay | Admitting: Pharmacy Technician

## 2023-08-16 NOTE — Telephone Encounter (Signed)
 Pt c/o medication issue:  1. Name of Medication: nebivolol (BYSTOLIC) 10 MG tablet   2. How are you currently taking this medication (dosage and times per day)? As written   3. Are you having a reaction (difficulty breathing--STAT)? No   4. What is your medication issue? Pt called in about her refill. She states her medication is no $289.83 and she is unable to afford, please advise if there is anything she can do to lower cost.

## 2023-08-16 NOTE — Telephone Encounter (Signed)
 Pharmacy requesting change from Xyzal to Clarinex. Xyzal not covered by insurance. Is this change OK?

## 2023-08-16 NOTE — Telephone Encounter (Signed)
   I called [facility] and gave them the coupon information but its still 289.83 on ins anc oupon

## 2023-08-17 ENCOUNTER — Telehealth: Payer: Self-pay | Admitting: Cardiovascular Disease

## 2023-08-17 ENCOUNTER — Other Ambulatory Visit (HOSPITAL_COMMUNITY): Payer: Self-pay

## 2023-08-17 ENCOUNTER — Other Ambulatory Visit: Payer: Self-pay

## 2023-08-17 MED ORDER — BYSTOLIC 10 MG PO TABS: 10.0000 mg | ORAL_TABLET | Freq: Every day | ORAL | 3 refills | Status: DC

## 2023-08-17 MED ORDER — NEBIVOLOL HCL 10 MG PO TABS: 10.0000 mg | ORAL_TABLET | Freq: Every day | ORAL | 3 refills | Status: DC

## 2023-08-17 NOTE — Telephone Encounter (Signed)
 Called and spoke with patient. Recommended patient using [facility]. Patient verbalizes understanding. Prescription sent to preferred pharmacy.

## 2023-08-17 NOTE — Telephone Encounter (Signed)
 MyChart message sent to patient.

## 2023-08-17 NOTE — Telephone Encounter (Signed)
 Sent DAW name brand Bystolic to [facility].   Thanks!

## 2023-08-17 NOTE — Telephone Encounter (Signed)
 Pt c/o medication issue:  1. Name of Medication: nebivolol (BYSTOLIC) 10 MG tablet  2. How are you currently taking this medication (dosage and times per day)? Take 1 tablet (10 mg total) by mouth daily.   3. Are you having a reaction (difficulty breathing--STAT)? No  4. What is your medication issue? Patient is currently out of medication. Patient stated she spoke with the pharmacy about getting the medication. Patient stated we sent over the form, but it is still missing information. Patient stated we can call the approval department to get PA over the phone (fastest way). Patient gave a number we can call [phone]. Patient gave a tracking number of "[phone]". Patient stated there is information they may need such as...  Name, DOB, Ins id # [id number], OV notes, why she needs the medication, and what her allergic reaction was.

## 2023-08-17 NOTE — Telephone Encounter (Signed)
 Patient is following up. She would like to make sure a PA was also sent to [employer]. Please advise.

## 2023-08-17 NOTE — Telephone Encounter (Signed)
 Pt c/o medication issue:  1. Name of Medication: nebivolol (BYSTOLIC) 10 MG tablet   2. How are you currently taking this medication (dosage and times per day)?   3. Are you having a reaction (difficulty breathing--STAT)?   4. What is your medication issue? Pharmacy is calling to get name brand version of this medication sent in. Calling to direct to the form needed to fill out.  States to look for the brand exception form on the website for co-pay exceptions. Look for 3rd tier non-formulary brand drug exception.    Patient is out of this medication.

## 2023-08-20 ENCOUNTER — Other Ambulatory Visit (HOSPITAL_COMMUNITY): Payer: Self-pay

## 2023-08-20 ENCOUNTER — Telehealth: Payer: Self-pay | Admitting: Pharmacy Technician

## 2023-08-20 NOTE — Telephone Encounter (Signed)
 I faxed [phone] the medical necessity letter and all things required for the daw waiver at 1:17pm

## 2023-08-20 NOTE — Telephone Encounter (Signed)
    I called and spoke to the pharmacy and they will re-bill her prescription and she will get her refund then she will get 85 tablets and should only have to pay the 15.00. I called the patient and made her aware. Pt is thankful. Pharmacy said they could re-bill on 08/24/23

## 2023-08-20 NOTE — Telephone Encounter (Signed)
 Patient is following up. She states the pharmacy gave her 3 tablets on Friday, but she's now completely out of medication again. She states the [facility] PA department needs to be called as soon as possible at the number provided and they will take a verbal order since patient is completely out of medication. She states they also need an approval date of 08/17/23. Patient would like a call back to confirm.

## 2023-08-20 NOTE — Telephone Encounter (Signed)
 I called the insurance and the patient. Daw waiver has been started in new encounter on 08/20/23. Prior Berkley Harvey is not req but the daw waiver could help with bringing cost down to the generic cost. Patient is aware.

## 2023-08-22 ENCOUNTER — Other Ambulatory Visit: Payer: Self-pay | Admitting: Allergy

## 2023-08-22 NOTE — Telephone Encounter (Signed)
 [facility] says to take xyzal but it is not covered by pts insurance do you want pt on clarinex?

## 2023-08-27 ENCOUNTER — Other Ambulatory Visit (HOSPITAL_COMMUNITY): Payer: Self-pay

## 2023-08-28 ENCOUNTER — Encounter: Payer: Self-pay | Admitting: Pharmacy Technician

## 2023-08-28 ENCOUNTER — Telehealth: Payer: Self-pay | Admitting: Pharmacy Technician

## 2023-08-28 ENCOUNTER — Other Ambulatory Visit (HOSPITAL_COMMUNITY): Payer: Self-pay

## 2023-08-28 NOTE — Telephone Encounter (Signed)
 Pt called and said she will be getting [employer] insurance come 09/18/23. She said we needed to send tier exception to them like we did to the last insurance. Sent to [employer] through covermymeds [id number]

## 2023-10-13 ENCOUNTER — Other Ambulatory Visit: Payer: Self-pay | Admitting: Physician Assistant

## 2023-10-30 ENCOUNTER — Telehealth: Payer: Self-pay | Admitting: Cardiovascular Disease

## 2023-10-30 NOTE — Telephone Encounter (Signed)
 Left a message for the patient to call back.

## 2023-10-30 NOTE — Telephone Encounter (Signed)
 Pt c/o medication issue:  1. Name of Medication: amLODipine  (NORVASC ) 2.5 MG tablet   2. How are you currently taking this medication (dosage and times per day)?    3. Are you having a reaction (difficulty breathing--STAT)? no  4. What is your medication issue? Patient the prescription for the medication is written incorrectly. Please advise

## 2023-11-01 ENCOUNTER — Other Ambulatory Visit: Payer: Self-pay

## 2023-11-01 NOTE — Telephone Encounter (Signed)
 Called patient for clarification on medication (amlodipine )  RX pended for MD approval

## 2023-11-05 ENCOUNTER — Other Ambulatory Visit: Payer: Self-pay

## 2023-11-07 ENCOUNTER — Telehealth: Payer: Self-pay | Admitting: Cardiovascular Disease

## 2023-11-07 NOTE — Telephone Encounter (Signed)
 Okay to switch to generic medication?   Thanks!

## 2023-11-07 NOTE — Telephone Encounter (Signed)
 Pt c/o medication issue:  1. Name of Medication: BYSTOLIC  10 MG tablet   2. How are you currently taking this medication (dosage and times per day)?  10 mg, Daily  3. Are you having a reaction (difficulty breathing--STAT)?   4. What is your medication issue? Cost $167 brand, $15 generic Is ok to switch to generic. Pharmacy requesting cb to [phone]  UJW#[id number]

## 2023-11-08 MED ORDER — AMLODIPINE BESYLATE 2.5 MG PO TABS: 2.5000 mg | ORAL_TABLET | Freq: Two times a day (BID) | ORAL | 3 refills | Status: AC

## 2023-11-08 NOTE — Telephone Encounter (Signed)
 Called pharmacy and notified of the following from Dr. Gollan.  Okay to switch to generic Thx TGollan

## 2023-11-15 ENCOUNTER — Other Ambulatory Visit: Payer: Self-pay | Admitting: Obstetrics and Gynecology

## 2023-11-15 DIAGNOSIS — Z1231 Encounter for screening mammogram for malignant neoplasm of breast: Secondary | ICD-10-CM

## 2023-11-25 ENCOUNTER — Ambulatory Visit: Admission: EM | Admit: 2023-11-25 | Discharge: 2023-11-25 | Disposition: A | Discharge status: Home / Self Care

## 2023-11-25 DIAGNOSIS — Z1211 Encounter for screening for malignant neoplasm of colon: Secondary | ICD-10-CM | POA: Insufficient documentation

## 2023-11-25 DIAGNOSIS — N926 Irregular menstruation, unspecified: Secondary | ICD-10-CM | POA: Insufficient documentation

## 2023-11-25 DIAGNOSIS — R141 Gas pain: Secondary | ICD-10-CM | POA: Insufficient documentation

## 2023-11-25 DIAGNOSIS — Z860100 Personal history of colon polyps, unspecified: Secondary | ICD-10-CM | POA: Insufficient documentation

## 2023-11-25 DIAGNOSIS — Z800 Family history of malignant neoplasm of digestive organs: Secondary | ICD-10-CM | POA: Insufficient documentation

## 2023-11-25 DIAGNOSIS — R1310 Dysphagia, unspecified: Secondary | ICD-10-CM | POA: Insufficient documentation

## 2023-11-25 DIAGNOSIS — K5900 Constipation, unspecified: Secondary | ICD-10-CM | POA: Insufficient documentation

## 2023-11-25 DIAGNOSIS — J0110 Acute frontal sinusitis, unspecified: Secondary | ICD-10-CM | POA: Diagnosis not present

## 2023-11-25 DIAGNOSIS — Z8249 Family history of ischemic heart disease and other diseases of the circulatory system: Secondary | ICD-10-CM | POA: Insufficient documentation

## 2023-11-25 DIAGNOSIS — R002 Palpitations: Secondary | ICD-10-CM | POA: Insufficient documentation

## 2023-11-25 LAB — POCT INFLUENZA A/B
Influenza A, POC: NEGATIVE
Influenza B, POC: NEGATIVE

## 2023-11-25 LAB — POC SARS CORONAVIRUS 2 AG -  ED: SARS Coronavirus 2 Ag: NEGATIVE

## 2023-11-25 MED ORDER — AMOXICILLIN-POT CLAVULANATE 875-125 MG PO TABS: 1.0000 | ORAL_TABLET | Freq: Two times a day (BID) | ORAL | 0 refills | Status: DC

## 2023-11-25 NOTE — ED Provider Notes (Signed)
 [name]    CSN: [id number] Arrival date & time: 11/25/23  1344      History   Chief Complaint Chief Complaint  Patient presents with   Nasal Congestion   Sore Throat   Headache   Chills    HPI Nicole Gould is a 63 y.o. female.   Patient here today for evaluation of sore and scratchy throat she has had for about 5 days.  She reports that she has also had some congestion and cough with headache and body chills.  She denies any fever.  She states that symptoms were improving but sore throat has returned with congestion.  She notes her mucus is green in color.  She requests COVID screening.  The history is provided by the patient.  Sore Throat Associated symptoms include headaches. Pertinent negatives include no abdominal pain and no shortness of breath.  Headache Associated symptoms: congestion, cough, sinus pressure and sore throat   Associated symptoms: no abdominal pain, no diarrhea, no ear pain, no fever, no nausea and no vomiting     Past Medical History:  Diagnosis Date   Allergy    Anxiety    Chicken pox    Chronic Dizziness    Colon polyps    Concussion    Frequent headaches    GERD (gastroesophageal reflux disease)    on meds   History of stress test    a. 2005 - in setting of ectopy - reportedly nl.   Hypertension    a. 03/2018 24hr ABM: Mean BP 146/75, mean HR 61; Mean awake BP/HR 150/78, 62; Mean sleep BP/HR 130/65, 58.   Migraines    OSA (obstructive sleep apnea)    No Cpap at this time   Pre-diabetes    Pre-eclampsia    with grand mal seizures   Seasonal allergies    Seizure disorder in pregnancy (HCC)    Seizures (HCC)    with pregnancy 1981- pre clampsia     Patient Active Problem List   Diagnosis Date Noted   Family history of colon cancer 11/25/2023   Dysphagia 11/25/2023   Constipation 11/25/2023   Family history of coronary artery disease 11/25/2023   Flatulence, eructation and gas pain 11/25/2023   Irregular  periods 11/25/2023   History of colonic polyps 11/25/2023   Encounter for screening for malignant neoplasm of colon 11/25/2023   Palpitations 11/25/2023   Hypoglycemia 07/06/2023   Essential (primary) hypertension 07/06/2023   Obesity (BMI 30-39.9) 07/06/2023   Acne vulgaris 04/26/2023   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   Hypokalemia 02/13/2023   Abnormal finding on mammography 10/04/2022   Abnormal mammogram 10/04/2022   Lumbar radiculopathy 05/17/2020   Mild sleep apnea 03/31/2020   Postconcussive syndrome 01/23/2020   Gastroesophageal reflux disease 08/30/2019   Hyperlipidemia 06/26/2019   Dysesthesia 06/24/2019   Dry mouth, unspecified 06/24/2019   Vaccine counseling 06/24/2019   Class 1 obesity with serious comorbidity and body mass index (BMI) of 32.0 to 32.9 in adult 05/06/2019   Insomnia due to stress, anxiety and fear 06/06/2018   Noncompliance with diet and medication regimen 04/21/2018   Mood disorder (HCC) 03/07/2018   Polyp of corpus uteri 02/08/2018   Hx of seizure disorder 11/10/2017   Intractable migraine without aura and without status migrainosus 11/10/2017   Prediabetes 11/10/2017   Spells of trembling 11/10/2017   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   Reactive hypoglycemia 03/25/2017   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   Dizziness 10/08/2016  Obesity with body mass index 30 or greater 08/09/2016   History of adenomatous polyp of colon 08/04/2016   Seasonal and perennial allergic rhinoconjunctivitis 07/12/2016   Chronic suprapubic pain 05/30/2015   Menopausal symptom 10/25/2014   Primary insomnia 05/05/2014   Anxiety 05/05/2014   Chronic venous insufficiency 04/21/2014   Adenomatous polyp of colon 05/12/2013   Pain 03/11/2013   Essential hypertension, benign 01/28/2013   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      Home Medications    Prior to Admission medications   Medication Sig Start Date End Date Taking? Authorizing Provider  amLODipine  (NORVASC ) 2.5 MG tablet Take 1 tablet (2.5 mg total) by mouth 2 (two) times daily. May take an extra tablet (2.5 MG) daily as needed for systolic blood pressure greater then 140. 11/08/23 11/07/24 Yes Gollan, Timothy J, MD  amoxicillin -clavulanate (AUGMENTIN ) 875-125 MG tablet Take 1 tablet by mouth every 12 (twelve) hours. 11/25/23  Yes Vernestine Gondola, PA-C  Blood Glucose Monitoring Suppl (ONETOUCH VERIO FLEX SYSTEM) w/Device KIT as directed. 08/07/23  Yes [provider]  BYSTOLIC  10 MG tablet Take 1 tablet (10 mg total) by mouth daily. 08/17/23  Yes Gollan, Timothy J, MD  fluconazole  (DIFLUCAN ) 150 MG tablet Take 150 mg by mouth once. 08/10/23  Yes [provider]  Homeopathic Products (ZICAM COLD REMEDY PO) Take by mouth.   Yes [provider]  pantoprazole  (PROTONIX ) 40 MG tablet Take 40 mg by mouth daily.   Yes [provider]  acetaminophen  (TYLENOL ) 500 MG tablet Take 1,000 mg by mouth every 6 (six) hours as needed. Patient not taking: Reported on 08/15/2023    [provider]  Apple Cider Vinegar 500 MG TABS Take 500 mg by mouth daily.    [provider]  Atenolol  (TENORMIN  PO) Tenormin     [provider]  azelastine  (OPTIVAR ) 0.05 % ophthalmic solution Place 1 drop into both eyes 2 (two) times daily as needed (itchy/watery eyes). 11/10/19   Trudy Fusi, DO  benzonatate  (TESSALON ) 200 MG capsule Take 1 capsule (200 mg total) by mouth 3 (three) times daily as needed for cough. Patient not taking: Reported on 08/15/2023 07/06/23   Rodriguez-Southworth, Sylvia, PA-C  betamethasone, augmented, (DIPROLENE) 0.05 % lotion  Apply 1 application. topically 2 (two) times daily as needed. 08/18/21   [provider]  Bioflavonoid Products (VITAMIN C) CHEW Chew 1 tablet by mouth daily.    [provider]  Biotin 1000 MCG tablet Take 1,000 mcg by mouth daily.    [provider]  Cholecalciferol 50 MCG (2000 UT) TABS Take 1 tablet by mouth daily.    [provider]  CINNAMON PO Take 1,000 mg by mouth daily.     [provider]  desloratadine (CLARINEX) 5 MG tablet TAKE 1 TABLET (5 MG TOTAL) BY MOUTH DAILY. 08/23/23   Brian Campanile, MD  glucose blood test strip See admin instructions. 08/17/21   [provider]  glucose blood test strip AS DIRECTED IN VITRO ONCE A DAY 90 DAYS    [provider]  Homeopathic Products (ZICAM COLD REMEDY PO)  Take by mouth. Patient not taking: Reported on 08/15/2023    [provider]  HYDROCHLOROTHIAZIDE  PO hydroCHLOROthiazide     [provider]  HYDROCHLOROTHIAZIDE  PO HCTZ    [provider]  hyoscyamine  (LEVSIN SL) 0.125 MG SL tablet Take 0.125 mg by mouth every 4 (four) hours as needed for cramping. Patient not taking: Reported on 08/15/2023    [provider]  Lancet Devices [facility] DELICA PLUS LANCING) MISC CHECK BLOOD SUGAR ONCE A DAY 90 DAYS    [provider]  Lancets (ONETOUCH DELICA PLUS LANCET33G) MISC SMARTSIG:1 Topical Daily 08/26/21   [provider]  meclizine  (ANTIVERT ) 25 MG tablet Take 25 mg by mouth 3 (three) times daily as needed. Patient not taking: Reported on 08/15/2023    [provider]  minoxidil (LONITEN) 2.5 MG tablet Take 2.5 mg by mouth as directed.  Take 1/2 tab by mouth daily Patient not taking: Reported on 08/15/2023 04/26/23   [provider]  Multiple Vitamins-Minerals (MULTIVITAMIN WITH MINERALS) tablet Take 1 tablet by mouth daily.    [provider]  Multiple Vitamins-Minerals (OCUVITE EXTRA PO) Take by mouth  daily. Patient not taking: Reported on 08/15/2023    [provider]  Olopatadine-Mometasone (RYALTRIS ) 665-25 MCG/ACT SUSP Place 2 sprays into the nose in the morning and at bedtime. 08/15/23   Brian Campanile, MD  Omega-3 Fatty Acids (FISH OIL) 300 MG CAPS Take 300 mg by mouth 3 (three) times daily.    [provider]  ondansetron  (ZOFRAN -ODT) 8 MG disintegrating tablet Take 8 mg by mouth every 8 (eight) hours as needed. Patient not taking: Reported on 08/15/2023    [provider]  [facility] VERIO test strip 1 each daily. 08/26/21   [provider]  oseltamivir  (TAMIFLU ) 75 MG capsule Take 1 capsule (75 mg total) by mouth every 12 (twelve) hours. Patient not taking: Reported on 08/15/2023 07/06/23   Rodriguez-Southworth, Sylvia, PA-C  pantoprazole  (PROTONIX ) 40 MG tablet TAKE 1 TABLET BY MOUTH EVERY DAY 10/15/23   Pyrtle, Amber Bail, MD  potassium chloride  SA (KLOR-CON  M20) 20 MEQ tablet Take 20 mEq by mouth daily. Patient not taking: Reported on 08/15/2023    [provider]  Semaglutide-Weight Management (WEGOVY) 0.25 MG/0.5ML SOAJ Inject 0.25 mg into the skin once a week. Patient not taking: Reported on 08/15/2023    [provider]  sucralfate  (CARAFATE ) 1 g tablet Take 1 g by mouth 4 (four) times daily.    [provider]  tretinoin (RETIN-A) 0.025 % cream Apply 1 Application topically at bedtime.    [provider]  Turmeric (QC TUMERIC COMPLEX PO) Take by mouth.    [provider]  Turmeric POWD Take 1 tablet by mouth See admin instructions. Patient not taking: Reported on 08/15/2023    [provider]    Family History Family History  Problem Relation Age of Onset   Hypertension Mother    Diabetes Mother    Heart disease Father    Kidney disease Father    Hypertension Father    Diabetes Father    Cancer Maternal Grandfather        colon cancer   Colon cancer Maternal Grandfather         passed age 35   Colon polyps Neg Hx    Breast cancer Neg Hx    Esophageal cancer Neg Hx    Rectal cancer Neg Hx    Stomach cancer Neg Hx     Social History  Social History   Tobacco Use   Smoking status: Never    Passive exposure: Past (Dad)   Smokeless tobacco: Never  Vaping Use   Vaping status: Never Used  Substance Use Topics   Alcohol use: Yes    Alcohol/week: 1.0 standard drink of alcohol    Types: 1 Glasses of wine per week    Comment: Rarely.   Drug use: No     Allergies   Losartan ; Anesthetics, amide; Benadryl [diphenhydramine hcl (sleep)]; Benicar [olmesartan]; Cardura  [doxazosin  mesylate]; Fentanyl; Hydrochlorothiazide ; Losartan  potassium-hctz; Metformin and related; Oseltamivir ; Other; Prilocaine; and Semaglutide   Review of Systems Review of Systems  Constitutional:  Positive for chills. Negative for fever.  HENT:  Positive for congestion, sinus pressure and sore throat. Negative for ear pain.   Eyes:  Negative for discharge and redness.  Respiratory:  Positive for cough. Negative for shortness of breath and wheezing.   Gastrointestinal:  Negative for abdominal pain, diarrhea, nausea and vomiting.  Neurological:  Positive for headaches.     Physical Exam Triage Vital Signs ED Triage Vitals  Encounter Vitals Group     BP --      Systolic BP Percentile --      Diastolic BP Percentile --      Pulse --      Resp --      Temp --      Temp src --      SpO2 --      Weight 11/25/23 1400 195 lb 15.8 oz (88.9 kg)     Height 11/25/23 1400 5' 4.5" (1.638 m)     Head Circumference --      Peak Flow --      Pain Score 11/25/23 1357 0     Pain Loc --      Pain Education --      Exclude from Growth Chart --    No data found.  Updated Vital Signs BP 137/75 (BP Location: Left Arm)   Pulse 65   Temp 98.5 F (36.9 C) (Oral)   Resp 18   Ht 5' 4.5" (1.638 m)   Wt 195 lb 15.8 oz (88.9 kg)   LMP 12/01/2013 (Approximate)   SpO2 98%   BMI 33.12 kg/m    Visual Acuity Right Eye Distance:   Left Eye Distance:   Bilateral Distance:    Right Eye Near:   Left Eye Near:    Bilateral Near:     Physical Exam Vitals and nursing note reviewed.  Constitutional:      General: She is not in acute distress.    Appearance: Normal appearance. She is not ill-appearing.  HENT:     Head: Normocephalic and atraumatic.     Nose: Congestion present.     Mouth/Throat:     Mouth: Mucous membranes are moist.     Pharynx: No oropharyngeal exudate or posterior oropharyngeal erythema.  Eyes:     Conjunctiva/sclera: Conjunctivae normal.  Cardiovascular:     Rate and Rhythm: Normal rate and regular rhythm.     Heart sounds: Normal heart sounds. No murmur heard. Pulmonary:     Effort: Pulmonary effort is normal. No respiratory distress.     Breath sounds: Normal breath sounds. No wheezing, rhonchi or rales.  Skin:    General: Skin is warm and dry.  Neurological:     Mental Status: She is alert.  Psychiatric:        Mood and Affect: Mood normal.  Thought Content: Thought content normal.      UC Treatments / Results  Labs (all labs ordered are listed, but only abnormal results are displayed) Labs Reviewed  POC SARS CORONAVIRUS 2 AG -  ED - Normal  POCT INFLUENZA A/B - Normal    EKG   Radiology No results found.  Procedures Procedures (including critical care time)  Medications Ordered in UC Medications - No data to display  Initial Impression / Assessment and Plan / UC Course  I have reviewed the triage vital signs and the nursing notes.  Pertinent labs & imaging results that were available during my care of the patient were reviewed by me and considered in my medical decision making (see chart for details).    COVID and flu screening negative.  Given waxing and waning symptoms with reported congestion, mucus changes and sinus pressure we will treat to cover sinusitis with Augmentin .  Advised follow-up if no gradual  improvement or with any further concerns.  Final Clinical Impressions(s) / UC Diagnoses   Final diagnoses:  Acute non-recurrent frontal sinusitis   Discharge Instructions   None    ED Prescriptions     Medication Sig Dispense Auth. Provider   amoxicillin -clavulanate (AUGMENTIN ) 875-125 MG tablet Take 1 tablet by mouth every 12 (twelve) hours. 14 tablet Vernestine Gondola, PA-C      PDMP not reviewed this encounter.   Vernestine Gondola, PA-C 11/25/23 [phone]

## 2023-11-25 NOTE — ED Triage Notes (Signed)
"  This started on the 4th with sore/scratchy throat, started taking Zicam (doing nothing for it), this just got worse, then ha, body chills and pressure in head/ache, runny nose to congestion in now with occasional cough". No fever known.

## 2023-11-27 ENCOUNTER — Telehealth: Payer: Self-pay | Admitting: Physician Assistant

## 2023-11-27 MED ORDER — FLUCONAZOLE 150 MG PO TABS: 150.0000 mg | ORAL_TABLET | Freq: Every day | ORAL | 0 refills | Status: DC

## 2023-11-27 NOTE — Telephone Encounter (Signed)
 Pt called and request diflucan . Rx to pharmacy

## 2023-12-04 ENCOUNTER — Telehealth: Payer: Self-pay | Admitting: Allergy

## 2023-12-04 NOTE — Telephone Encounter (Signed)
 Patient called stating she has not been able to get her Ryaltris  prescription. Patient states she would be approved if we run both of her insurance for the Ryaltris . Patient would like a call back at [phone].

## 2023-12-05 NOTE — Telephone Encounter (Signed)
 Called patient - DOB/DPR verified - LMOVM advising patient to contact pharmacy regarding both of her insurances - making sure they have both on file.  If/When patient call back - please advise of above notation.

## 2023-12-07 NOTE — Telephone Encounter (Signed)
 Patient called back and stated that what she needs is through a specialty pharmacy, and she is not able to contact. She states that we need to reach out and figure out what the issue is, and she is still in need of this medication. She is expecting someone to call her back at [phone].

## 2023-12-10 ENCOUNTER — Ambulatory Visit
Admission: RE | Admit: 2023-12-10 | Discharge: 2023-12-10 | Disposition: A | Discharge status: Home / Self Care | Source: Ambulatory Visit | Attending: Obstetrics and Gynecology | Admitting: Obstetrics and Gynecology

## 2023-12-10 DIAGNOSIS — Z1231 Encounter for screening mammogram for malignant neoplasm of breast: Secondary | ICD-10-CM

## 2023-12-11 ENCOUNTER — Ambulatory Visit: Payer: Self-pay

## 2023-12-24 ENCOUNTER — Other Ambulatory Visit (HOSPITAL_COMMUNITY): Payer: Self-pay

## 2023-12-24 ENCOUNTER — Telehealth: Payer: Self-pay

## 2023-12-24 NOTE — Telephone Encounter (Signed)
 Patient called regarding her Ryaltris . Patient stated she had a horrible day yesterday and realized she never received a call back or her medication. I informed patient to contact the pharmacy and make sure they have both insurances on file. I informed patient that if they do not to have both insurances to have the pharmacy add her secondary and run it again. I informed patient that if they do have both insurance and it is being denied to contact the office and a prior authorization may need to be done. Patient verbalized understanding and was very appreciative for the information.

## 2023-12-24 NOTE — Telephone Encounter (Signed)
*  AA  Pharmacy Patient Advocate Encounter   Received notification from Pt Calls Messages that prior authorization for Ryaltris  is required/requested.   Insurance verification completed.   The patient is insured through [facility] .   Per test claim: The current 30 day co-pay is, $83.56.  No PA needed at this time. This test claim was processed through [facility]- copay amounts may vary at other pharmacies due to pharmacy/plan contracts, or as the patient moves through the different stages of their insurance plan.

## 2023-12-24 NOTE — Telephone Encounter (Signed)
 Spoke with patient, she informed me that she spoke with insurance and was informed that she was switched to another prescription plan. She informed me that her new prescription plan covers medication however she has a $83.56 co-pay. Patient stated she is not able to pay that every month and was wondering if there was something else she can use that works just as well. Please advise.

## 2023-12-24 NOTE — Telephone Encounter (Signed)
 Patient called back to informed the office that the pharmacy informed her that they do have both insurances and her primary insurance has denied the Ryaltris  and a prior authorization is needed. Can we please do a prior authorization for Ryaltris . Thank you.

## 2023-12-24 NOTE — Telephone Encounter (Signed)
 Cannot find secondary prescription plan. Patients pharmacy benefits on file showing no PA needed and a $83.56 co-pay. Patients Medicare Part B is not for this type of claim.

## 2024-01-02 MED ORDER — AZELASTINE-FLUTICASONE 137-50 MCG/ACT NA SUSP: NASAL | 2 refills | Status: AC

## 2024-01-02 NOTE — Telephone Encounter (Signed)
 Spoke with the patient and informed her on Dr. Jeneal recommendations. [facility]  [facility] into the [facility] on [address]. Recommendations also sent in MyChart message. Verbalized Understanding.

## 2024-01-02 NOTE — Addendum Note (Signed)
 Addended by: MARCINE ISAIAH CROME on: 01/02/2024 04:27 PM   Modules accepted: Orders

## 2024-03-26 DIAGNOSIS — Z0289 Encounter for other administrative examinations: Secondary | ICD-10-CM

## 2024-04-01 ENCOUNTER — Ambulatory Visit (INDEPENDENT_AMBULATORY_CARE_PROVIDER_SITE_OTHER): Admitting: Sleep Medicine

## 2024-04-01 ENCOUNTER — Encounter: Payer: Self-pay | Admitting: Sleep Medicine

## 2024-04-01 VITALS — BP 128/78 | HR 58 | Temp 97.5°F | Ht 64.5 in | Wt 197.4 lb

## 2024-04-01 DIAGNOSIS — G4733 Obstructive sleep apnea (adult) (pediatric): Secondary | ICD-10-CM

## 2024-04-01 DIAGNOSIS — E669 Obesity, unspecified: Secondary | ICD-10-CM

## 2024-04-01 DIAGNOSIS — I10 Essential (primary) hypertension: Secondary | ICD-10-CM | POA: Diagnosis not present

## 2024-04-01 NOTE — Patient Instructions (Signed)
 Nicole Gould

## 2024-04-01 NOTE — Progress Notes (Signed)
 Name:Nicole Gould MRN: [id number] DOB: 08/19/1960   CHIEF COMPLAINT:  EXCESSIVE DAYTIME SLEEPINESS   HISTORY OF PRESENT ILLNESS: Nicole Gould is a 63 y.o. w/ a h/o HTN, obesity and GERD who present for c/o snoring and excessive daytime sleepiness which has been present for several years. Reports nocturnal awakenings due to nocturia and occasionally has difficulty falling back to sleep. Reports significant weight changes. Admits to dry mouth and night sweats. Denies morning headaches, RLS symptoms, dream enactment, cataplexy, hypnagogic or hypnapompic hallucinations. Denies a family history of sleep apnea. Denies drowsy driving. Occasional alcohol use, denies tobacco or illicit drug use.   Bedtime 11 pm-12 am Sleep onset 10 mins Rise time 7 am   EPWORTH SLEEP SCORE 7    04/01/2024    1:23 PM  Results of the Epworth flowsheet  Sitting and reading 1  Watching TV 3  Sitting, inactive in a public place (e.g. a theatre or a meeting) 2  As a passenger in a car for an hour without a break 0  Lying down to rest in the afternoon when circumstances permit 1  Sitting and talking to someone 0  Sitting quietly after a lunch without alcohol 0  In a car, while stopped for a few minutes in traffic 0  Total score 7    PAST MEDICAL HISTORY :   has a past medical history of Allergy, Anxiety, Chicken pox, Chronic Dizziness, Colon polyps, Concussion, Frequent headaches, GERD (gastroesophageal reflux disease), History of stress test, Hypertension, Migraines, OSA (obstructive sleep apnea), Pre-diabetes, Pre-eclampsia, Seasonal allergies, Seizure disorder in pregnancy (HCC), and Seizures (HCC).  has a past surgical history that includes Tubal ligation; Colonoscopy (2018); Polypectomy (2018); Dilation and curettage of uterus (09/08/2014); and Tonsillectomy. Prior to Admission medications   Medication Sig Start Date End Date Taking? Authorizing Provider  amLODipine  (NORVASC ) 2.5 MG tablet Take  1 tablet (2.5 mg total) by mouth 2 (two) times daily. May take an extra tablet (2.5 MG) daily as needed for systolic blood pressure greater then 140. 11/08/23 11/07/24 Yes Gollan, Timothy J, MD  Apple Cider Vinegar 500 MG TABS Take 500 mg by mouth daily.   Yes [provider]  betamethasone, augmented, (DIPROLENE) 0.05 % lotion Apply 1 application. topically 2 (two) times daily as needed. 08/18/21  Yes [provider]  Bioflavonoid Products (VITAMIN C) CHEW Chew 1 tablet by mouth daily.   Yes [provider]  Blood Glucose Monitoring Suppl (ONETOUCH VERIO FLEX SYSTEM) w/Device KIT as directed. 08/07/23  Yes [provider]  BYSTOLIC  10 MG tablet Take 1 tablet (10 mg total) by mouth daily. 08/17/23  Yes Gollan, Timothy J, MD  Cholecalciferol 50 MCG (2000 UT) TABS Take 1 tablet by mouth daily.   Yes [provider]  Homeopathic Products [facility] COLD REMEDY PO) Take by mouth.   Yes [provider]  Multiple Vitamins-Minerals (MULTIVITAMIN WITH MINERALS) tablet Take 1 tablet by mouth daily.   Yes [provider]  Multiple Vitamins-Minerals (OCUVITE EXTRA PO) Take by mouth daily.   Yes [provider]  Olopatadine-Mometasone (RYALTRIS ) 665-25 MCG/ACT SUSP Place 2 sprays into the nose in the morning and at bedtime. 08/15/23  Yes Padgett, Danita Macintosh, MD  Omega-3 Fatty Acids (FISH OIL) 300 MG CAPS Take 300 mg by mouth 3 (three) times daily.   Yes [provider]  OVER THE COUNTER MEDICATION daily. Nutriful- Hair Growth Vitamin   Yes [provider]  pantoprazole  (PROTONIX ) 40 MG tablet  TAKE 1 TABLET BY MOUTH EVERY DAY 10/15/23  Yes Pyrtle, Gordy HERO, MD  Probiotic Product (PROBIOTIC-10 PO) Take 1 capsule by mouth daily.   Yes [provider]  sucralfate  (CARAFATE ) 1 g tablet Take 1 g by mouth 4 (four) times daily.   Yes [provider]  tretinoin (RETIN-A) 0.025 % cream Apply 1 Application topically at  bedtime.   Yes [provider]  Turmeric (QC TUMERIC COMPLEX PO) Take by mouth.   Yes [provider]  acetaminophen  (TYLENOL ) 500 MG tablet Take 1,000 mg by mouth every 6 (six) hours as needed. Patient not taking: Reported on 04/01/2024    [provider]  amoxicillin -clavulanate (AUGMENTIN ) 875-125 MG tablet Take 1 tablet by mouth every 12 (twelve) hours. Patient not taking: Reported on 04/01/2024 11/25/23   Billy Asberry FALCON, PA-C  Atenolol  (TENORMIN  PO) Tenormin  Patient not taking: Reported on 04/01/2024    [provider]  azelastine  (OPTIVAR ) 0.05 % ophthalmic solution Place 1 drop into both eyes 2 (two) times daily as needed (itchy/watery eyes). Patient not taking: Reported on 04/01/2024 11/10/19   Luke Orlan HERO, DO  Azelastine -Fluticasone  137-50 MCG/ACT SUSP 1 spray twice a day as needed for runny or stuffy nose. Patient not taking: Reported on 04/01/2024 01/02/24   Jeneal Danita Macintosh, MD  benzonatate  (TESSALON ) 200 MG capsule Take 1 capsule (200 mg total) by mouth 3 (three) times daily as needed for cough. Patient not taking: Reported on 04/01/2024 07/06/23   Rodriguez-Southworth, Kyra, PA-C  Biotin 1000 MCG tablet Take 1,000 mcg by mouth daily. Patient not taking: Reported on 04/01/2024    [provider]  CINNAMON PO Take 1,000 mg by mouth daily.  Patient not taking: Reported on 04/01/2024    [provider]  desloratadine (CLARINEX) 5 MG tablet TAKE 1 TABLET (5 MG TOTAL) BY MOUTH DAILY. Patient not taking: Reported on 04/01/2024 08/23/23   Jeneal Danita Macintosh, MD  fluconazole  (DIFLUCAN ) 150 MG tablet Take 150 mg by mouth once. Patient not taking: Reported on 04/01/2024 08/10/23   [provider]  fluconazole  (DIFLUCAN ) 150 MG tablet Take 1 tablet (150 mg total) by mouth daily. Patient not taking: Reported on 04/01/2024 11/27/23   Flint Raring K, PA-C  glucose blood test strip See admin instructions. Patient not  taking: Reported on 04/01/2024 08/17/21   [provider]  glucose blood test strip AS DIRECTED IN VITRO ONCE A DAY 90 DAYS Patient not taking: Reported on 04/01/2024    [provider]  Homeopathic Products [facility] COLD REMEDY PO) Take by mouth. Patient not taking: Reported on 04/01/2024    [provider]  HYDROCHLOROTHIAZIDE  PO hydroCHLOROthiazide  Patient not taking: Reported on 04/01/2024    [provider]  HYDROCHLOROTHIAZIDE  PO HCTZ Patient not taking: Reported on 04/01/2024    [provider]  hyoscyamine  (LEVSIN  SL) 0.125 MG SL tablet Take 0.125 mg by mouth every 4 (four) hours as needed for cramping. Patient not taking: Reported on 04/01/2024    [provider]  Lancet Devices [facility] DELICA PLUS LANCING) MISC CHECK BLOOD SUGAR ONCE A DAY 90 DAYS Patient not taking: Reported on 04/01/2024    [provider]  Lancets [facility] DELICA PLUS [place]) MISC SMARTSIG:1 Topical Daily Patient not taking: Reported on 04/01/2024 08/26/21   [provider]  meclizine  (ANTIVERT ) 25 MG tablet Take 25 mg by mouth 3 (three) times daily as needed. Patient not taking: Reported on 04/01/2024    [provider]  minoxidil (LONITEN) 2.5  MG tablet Take 2.5 mg by mouth as directed.  Take 1/2 tab by mouth daily Patient not taking: Reported on 04/01/2024 04/26/23   [provider]  ondansetron  (ZOFRAN -ODT) 8 MG disintegrating tablet Take 8 mg by mouth every 8 (eight) hours as needed. Patient not taking: Reported on 04/01/2024    [provider]  [facility] VERIO test strip 1 each daily. Patient not taking: Reported on 04/01/2024 08/26/21   [provider]  oseltamivir  (TAMIFLU ) 75 MG capsule Take 1 capsule (75 mg total) by mouth every 12 (twelve) hours. Patient not taking: Reported on 04/01/2024 07/06/23   Rodriguez-Southworth, Sylvia, PA-C  pantoprazole  (PROTONIX ) 40 MG tablet Take 40 mg by mouth  daily. Patient not taking: Reported on 04/01/2024    [provider]  potassium chloride  SA (KLOR-CON  M20) 20 MEQ tablet Take 20 mEq by mouth daily. Patient not taking: Reported on 04/01/2024    [provider]  Semaglutide-Weight Management (WEGOVY) 0.25 MG/0.5ML SOAJ Inject 0.25 mg into the skin once a week. Patient not taking: Reported on 04/01/2024    [provider]  Turmeric POWD Take 1 tablet by mouth See admin instructions. Patient not taking: Reported on 04/01/2024    [provider]   Allergies  Allergen Reactions   Losartan  Shortness Of Breath    Dizziness,  Shortness of breath    Anesthetics, Amide Nausea And Vomiting   Benadryl [Diphenhydramine Hcl (Sleep)]     Made feel paralyzed when had benadryl with a cocktail for procedure  Decadron , bendayl, regaln combo at [facility] 2014- see care every where    Benicar [Olmesartan]     Presyncopal , light headed    Cardura  [Doxazosin  Mesylate] Other (See Comments)    makes me feel terrible   Fentanyl Hypertension    Severe htn    Hydrochlorothiazide      Other Reaction(s): dizziness   Losartan  Potassium-Hctz     Dizziness,  Shortness of breath    Metformin And Related     out of body experience     Oseltamivir      Other Reaction(s): vertigo   Other     WEGOVY  Other Reaction(s): nausea & vomiting    Prilocaine Nausea And Vomiting   Semaglutide Other (See Comments)    , Pain in the chest and dizziness    FAMILY HISTORY:  family history includes Breast cancer (age of onset: 11 - 25) in her cousin; Colon cancer in her maternal grandfather; Diabetes in her father and mother; Heart disease in her father; Hypertension in her father and mother; Kidney disease in her father. SOCIAL HISTORY:  reports that she has never smoked. She has been exposed to tobacco smoke. She has never used smokeless tobacco. She reports current alcohol use of about 1.0 standard drink of alcohol per week. She reports  that she does not use drugs.   Review of Systems:  Gen:  Denies  fever, sweats, chills weight loss  HEENT: Denies blurred vision, double vision, ear pain, eye pain, hearing loss, nose bleeds, sore throat Cardiac:  No dizziness, chest pain or heaviness, chest tightness,edema, No JVD Resp:   No cough, -sputum production, -shortness of breath,-wheezing, -hemoptysis,  Gi: Denies swallowing difficulty, stomach pain, nausea or vomiting, diarrhea, constipation, bowel incontinence Gu:  Denies bladder incontinence, burning urine Ext:   Denies Joint pain, stiffness or swelling Skin: Denies  skin rash, easy bruising or bleeding or hives Endoc:  Denies polyuria, polydipsia , polyphagia or weight change Psych:   Denies  depression, insomnia or hallucinations  Other:  All other systems negative  VITAL SIGNS: BP 128/78   Pulse (!) 58   Temp (!) 97.5 F (36.4 C)   Ht 5' 4.5 (1.638 m)   Wt 197 lb 6.4 oz (89.5 kg)   LMP 12/01/2013 (Approximate)   SpO2 100%   BMI 33.36 kg/m    Physical Examination:   General Appearance: No distress  EYES PERRLA, EOM intact.   NECK Supple, No JVD Pulmonary: normal breath sounds, No wheezing.  CardiovascularNormal S1,S2.  No m/r/g.   Abdomen: Benign, Soft, non-tender. Skin:   warm, no rashes, no ecchymosis  Extremities: normal, no cyanosis, clubbing. Neuro:without focal findings,  speech normal  PSYCHIATRIC: Mood, affect within normal limits.   ASSESSMENT AND PLAN  OSA I suspect that OSA is likely present due to clinical presentation. Discussed the consequences of untreated sleep apnea. Advised not to drive drowsy for safety of patient and others. Will complete further evaluation with a home sleep study and follow up to review results.    HTN Stable, on current management. Following with PCP.   Obesity  Counseled patient on diet and lifestyle modification.    MEDICATION ADJUSTMENTS/LABS AND TESTS ORDERED: Recommend Sleep Study   Patient   satisfied with Plan of action and management. All questions answered  Follow up to review PSG results and treatment plan.   I spent a total of 56 minutes reviewing chart data, face-to-face evaluation with the patient, counseling and coordination of care as detailed above.    Felix Meras, M.D.  Sleep Medicine [facility]

## 2024-04-03 ENCOUNTER — Other Ambulatory Visit: Payer: Self-pay | Admitting: Internal Medicine

## 2024-04-08 ENCOUNTER — Telehealth: Payer: Self-pay | Admitting: Gastroenterology

## 2024-04-08 MED ORDER — PANTOPRAZOLE SODIUM 40 MG PO TBEC: 40.0000 mg | DELAYED_RELEASE_TABLET | Freq: Every day | ORAL | 1 refills | Status: DC

## 2024-04-08 NOTE — Telephone Encounter (Signed)
 Inbound call from patient stating she didn't realize she did not have anymore refills for medication pantoprazole  until she went to pharmacy to pick it up. Patient is now completely out of medication and would like to know if she can have it refilled. Patient states without medication she has extreme acid reflux and patient is scheduled for office visit for 12/5 but can't wait until then for medication Please advise  Thank you

## 2024-04-08 NOTE — Telephone Encounter (Signed)
 Prescription sent to patient's pharmacy until scheduled appt. Patient made aware.

## 2024-04-29 ENCOUNTER — Encounter (INDEPENDENT_AMBULATORY_CARE_PROVIDER_SITE_OTHER): Payer: Self-pay

## 2024-04-30 ENCOUNTER — Encounter (INDEPENDENT_AMBULATORY_CARE_PROVIDER_SITE_OTHER): Payer: Self-pay | Admitting: Family Medicine

## 2024-04-30 ENCOUNTER — Ambulatory Visit (INDEPENDENT_AMBULATORY_CARE_PROVIDER_SITE_OTHER): Payer: Self-pay | Admitting: Family Medicine

## 2024-04-30 VITALS — BP 136/82 | HR 70 | Temp 98.0°F | Ht 64.0 in | Wt 192.0 lb

## 2024-04-30 DIAGNOSIS — E782 Mixed hyperlipidemia: Secondary | ICD-10-CM

## 2024-04-30 DIAGNOSIS — Z1331 Encounter for screening for depression: Secondary | ICD-10-CM

## 2024-04-30 DIAGNOSIS — R5383 Other fatigue: Secondary | ICD-10-CM

## 2024-04-30 DIAGNOSIS — R0602 Shortness of breath: Secondary | ICD-10-CM | POA: Diagnosis not present

## 2024-04-30 DIAGNOSIS — I10 Essential (primary) hypertension: Secondary | ICD-10-CM | POA: Diagnosis not present

## 2024-04-30 DIAGNOSIS — Z6832 Body mass index (BMI) 32.0-32.9, adult: Secondary | ICD-10-CM

## 2024-04-30 DIAGNOSIS — E669 Obesity, unspecified: Secondary | ICD-10-CM

## 2024-04-30 DIAGNOSIS — R7303 Prediabetes: Secondary | ICD-10-CM | POA: Diagnosis not present

## 2024-04-30 DIAGNOSIS — E559 Vitamin D deficiency, unspecified: Secondary | ICD-10-CM

## 2024-04-30 DIAGNOSIS — E7800 Pure hypercholesterolemia, unspecified: Secondary | ICD-10-CM

## 2024-04-30 NOTE — Addendum Note (Signed)
 Addended by: CHESLEY NIELS CROME on: 04/30/2024 04:49 PM   Modules accepted: Orders
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
## 2024-05-01 LAB — MAGNESIUM: Magnesium: 2.4 mg/dL — ABNORMAL HIGH (ref 1.6–2.3)

## 2024-05-01 LAB — CBC WITH DIFFERENTIAL/PLATELET
Basophils Absolute: 0 x10E3/uL (ref 0.0–0.2)
Basos: 1 %
EOS (ABSOLUTE): 0 x10E3/uL (ref 0.0–0.4)
Eos: 1 %
Hematocrit: 40.6 % (ref 34.0–46.6)
Hemoglobin: 13.1 g/dL (ref 11.1–15.9)
Immature Grans (Abs): 0 x10E3/uL (ref 0.0–0.1)
Immature Granulocytes: 0 %
Lymphocytes Absolute: 1.2 x10E3/uL (ref 0.7–3.1)
Lymphs: 45 %
MCH: 30 pg (ref 26.6–33.0)
MCHC: 32.3 g/dL (ref 31.5–35.7)
MCV: 93 fL (ref 79–97)
Monocytes Absolute: 0.2 x10E3/uL (ref 0.1–0.9)
Monocytes: 6 %
Neutrophils Absolute: 1.3 x10E3/uL — ABNORMAL LOW (ref 1.4–7.0)
Neutrophils: 47 %
Platelets: 163 x10E3/uL (ref 150–450)
RBC: 4.36 x10E6/uL (ref 3.77–5.28)
RDW: 13.4 % (ref 11.7–15.4)
WBC: 2.7 x10E3/uL — ABNORMAL LOW (ref 3.4–10.8)

## 2024-05-01 LAB — LIPID PANEL WITH LDL/HDL RATIO
Cholesterol, Total: 178 mg/dL (ref 100–199)
HDL: 56 mg/dL (ref >39)
LDL Chol Calc (NIH): 112 mg/dL — ABNORMAL HIGH (ref 0–99)
LDL/HDL Ratio: 2 ratio (ref 0.0–3.2)
Triglycerides: 53 mg/dL (ref 0–149)
VLDL Cholesterol Cal: 10 mg/dL (ref 5–40)

## 2024-05-01 LAB — CMP14+EGFR
ALT: 19 IU/L (ref 0–32)
AST: 23 IU/L (ref 0–40)
Albumin: 4.5 g/dL (ref 3.9–4.9)
Alkaline Phosphatase: 51 IU/L (ref 49–135)
BUN/Creatinine Ratio: 19 (ref 12–28)
BUN: 14 mg/dL (ref 8–27)
Bilirubin Total: 0.7 mg/dL (ref 0.0–1.2)
CO2: 25 mmol/L (ref 20–29)
Calcium: 9.4 mg/dL (ref 8.7–10.3)
Chloride: 104 mmol/L (ref 96–106)
Creatinine, Ser: 0.72 mg/dL (ref 0.57–1.00)
Globulin, Total: 2.7 g/dL (ref 1.5–4.5)
Glucose: 86 mg/dL (ref 70–99)
Potassium: 4.3 mmol/L (ref 3.5–5.2)
Sodium: 141 mmol/L (ref 134–144)
Total Protein: 7.2 g/dL (ref 6.0–8.5)
eGFR: 93 mL/min/1.73 (ref >59)

## 2024-05-01 LAB — T3: T3, Total: 122 ng/dL (ref 71–180)

## 2024-05-01 LAB — HEMOGLOBIN A1C
Est. average glucose Bld gHb Est-mCnc: 120 mg/dL
Hgb A1c MFr Bld: 5.8 % — ABNORMAL HIGH (ref 4.8–5.6)

## 2024-05-01 LAB — PHOSPHORUS: Phosphorus: 3.5 mg/dL (ref 3.0–4.3)

## 2024-05-01 LAB — FOLATE: Folate: 17.4 ng/mL (ref >3.0)

## 2024-05-01 LAB — INSULIN, RANDOM: INSULIN: 8 u[IU]/mL (ref 2.6–24.9)

## 2024-05-01 LAB — T4, FREE: Free T4: 1.06 ng/dL (ref 0.82–1.77)

## 2024-05-01 LAB — VITAMIN B12: Vitamin B-12: 1229 pg/mL (ref 232–1245)

## 2024-05-01 LAB — TSH: TSH: 1.36 u[IU]/mL (ref 0.450–4.500)

## 2024-05-01 LAB — VITAMIN D 25 HYDROXY (VIT D DEFICIENCY, FRACTURES): Vit D, 25-Hydroxy: 60.9 ng/mL (ref 30.0–100.0)

## 2024-05-05 ENCOUNTER — Encounter (INDEPENDENT_AMBULATORY_CARE_PROVIDER_SITE_OTHER): Payer: Self-pay | Admitting: Family Medicine

## 2024-05-05 ENCOUNTER — Telehealth (INDEPENDENT_AMBULATORY_CARE_PROVIDER_SITE_OTHER): Payer: Self-pay | Admitting: Family Medicine

## 2024-05-05 NOTE — Telephone Encounter (Signed)
 Pt called and is asking for a letter for her employer [employer]) she was to be drinking 80 oz of water to go with her weight loss program but with her job she is working 8 hour days but was hired to do 4-5 hours only. Working the 8 hours she is not able to get in the amount of water you are recommending. She is asking for a letter stating she needs to work 4 to 5 hours for her to be able to drink the needed amount of water for her weight loss program. If you could put in my chart and let pt know. She said if they cannot accommodate her she will have to quit and that was ok with her if she did.

## 2024-05-06 ENCOUNTER — Ambulatory Visit: Attending: Sleep Medicine

## 2024-05-06 DIAGNOSIS — G478 Other sleep disorders: Secondary | ICD-10-CM | POA: Diagnosis not present

## 2024-05-06 DIAGNOSIS — G4761 Periodic limb movement disorder: Secondary | ICD-10-CM | POA: Diagnosis not present

## 2024-05-06 DIAGNOSIS — I493 Ventricular premature depolarization: Secondary | ICD-10-CM | POA: Insufficient documentation

## 2024-05-06 DIAGNOSIS — G4733 Obstructive sleep apnea (adult) (pediatric): Secondary | ICD-10-CM | POA: Diagnosis present

## 2024-05-06 NOTE — Telephone Encounter (Signed)
 Letter has been sent through my chart and has been signed and will be sent in the mail.

## 2024-05-12 ENCOUNTER — Telehealth: Payer: Self-pay | Admitting: Internal Medicine

## 2024-05-12 NOTE — Telephone Encounter (Signed)
 Inbound call from patient stating she was given a diet plan by [facility] weight loss that she is concerned about. States she was advised to eat a lot of meat. States in the past she had polyps due to eating a lot of meat. Requests for Dr. Albertus to review plan and give recommendations. Please advise, thank you.

## 2024-05-12 NOTE — Telephone Encounter (Signed)
 Returned patient call. Discussed healthy proteins focusing on white meat options avoiding processed  meats, pork, red meat, food with nitrates, high fat dairy. Patient voiced understanding.

## 2024-05-19 ENCOUNTER — Encounter (INDEPENDENT_AMBULATORY_CARE_PROVIDER_SITE_OTHER): Payer: Self-pay | Admitting: Family Medicine

## 2024-05-19 ENCOUNTER — Ambulatory Visit (INDEPENDENT_AMBULATORY_CARE_PROVIDER_SITE_OTHER): Payer: Self-pay | Admitting: Family Medicine

## 2024-05-19 VITALS — BP 144/75 | HR 68 | Temp 97.8°F | Ht 64.0 in | Wt 195.0 lb

## 2024-05-19 DIAGNOSIS — I10 Essential (primary) hypertension: Secondary | ICD-10-CM | POA: Diagnosis not present

## 2024-05-19 DIAGNOSIS — R7303 Prediabetes: Secondary | ICD-10-CM | POA: Diagnosis not present

## 2024-05-19 DIAGNOSIS — Z6833 Body mass index (BMI) 33.0-33.9, adult: Secondary | ICD-10-CM

## 2024-05-19 DIAGNOSIS — E559 Vitamin D deficiency, unspecified: Secondary | ICD-10-CM

## 2024-05-19 DIAGNOSIS — E7800 Pure hypercholesterolemia, unspecified: Secondary | ICD-10-CM

## 2024-05-19 DIAGNOSIS — E669 Obesity, unspecified: Secondary | ICD-10-CM

## 2024-05-19 NOTE — Progress Notes (Signed)
 Office: [phone]  /  Fax: [phone]  WEIGHT SUMMARY AND BIOMETRICS  Anthropometric Measurements Height: 5' 4 (1.626 m) Weight: 195 lb (88.5 kg) BMI (Calculated): 33.46 Weight at Last Visit: 192 lb Weight Lost Since Last Visit: 0 Weight Gained Since Last Visit: 3 lb Starting Weight: 192 lb Peak Weight: 197 lb Waist Measurement : 38 inches   Body Composition  Body Fat %: 44.1 % Fat Mass (lbs): 86 lbs Muscle Mass (lbs): 103.4 lbs Total Body Water (lbs): 78.2 lbs Visceral Fat Rating : 13   Other Clinical Data RMR: 1210 Fasting: no Labs: no Today's Visit #: 2 Starting Date: 04/30/24    Chief Complaint: OBESITY    History of Present Illness Nicole Gould is a 63 year old female with obesity who presents for a follow-up on her obesity workup to discuss test results and eating plan strategies.  She has been following a category two eating plan with low adherence at about twenty percent. She has not started an exercise regimen as none has been assigned yet. Her blood pressure readings were 144/75 and 143/76, and she has gained three pounds in the last three weeks. She finds it difficult to adhere to the eating plan, especially during Thanksgiving, due to family and social gatherings.  Her lab results show a magnesium level of 2.4, slightly above the normal range, despite not taking magnesium supplements since last spring. Vitamin levels, including folic acid, vitamin D , and B12, are within normal limits. She has a history of low vitamin D , which is currently well-controlled. Cholesterol levels are mostly good, except for an LDL level of 112, which is slightly elevated. Red and white blood cells are generally normal, though white blood cells are slightly lower than expected, which is a trend for her. Her thyroid  panel is normal.  Her hemoglobin A1c is 5.8. Her fasting glucose is 86, and her insulin  level is 8. She has a history of hypoglycemia, experiencing low blood  sugar episodes for years.  She discusses her metabolism and its impact on her weight loss. She has been consuming a protein shake almost daily and has experienced hair thinning, which she attributes to potential protein deficiency. She has a family history of diabetes, with her father and a first cousin affected by the condition.  She is retired and recently quit a part-time job due to its demands. No current exercise regimen, difficulty adhering to prescribed eating plan, and recent weight gain.      PHYSICAL EXAM:  Blood pressure (!) 144/75, pulse 68, temperature 97.8 F (36.6 C), height 5' 4 (1.626 m), weight 195 lb (88.5 kg), last menstrual period 12/01/2013, SpO2 100%. Body mass index is 33.47 kg/m.  DIAGNOSTIC DATA REVIEWED:  BMET    Component Value Date/Time   NA 141 04/30/2024 0945   NA 143 09/08/2014 2059   K 4.3 04/30/2024 0945   K 3.8 09/08/2014 2059   CL 104 04/30/2024 0945   CL 108 09/08/2014 2059   CO2 25 04/30/2024 0945   CO2 26 09/08/2014 2059   GLUCOSE 86 04/30/2024 0945   GLUCOSE 97 02/12/2023 1653   GLUCOSE 94 09/08/2014 2059   BUN 14 04/30/2024 0945   BUN 14 09/08/2014 2059   CREATININE 0.72 04/30/2024 0945   CREATININE 0.72 09/08/2014 2059   CALCIUM 9.4 04/30/2024 0945   CALCIUM 9.4 09/08/2014 2059   GFRNONAA >60 02/12/2023 1653   GFRNONAA >60 09/08/2014 2059   GFRAA >60 08/26/2019 1542   GFRAA >60 09/08/2014 2059  Lab Results  Component Value Date   HGBA1C 5.8 (H) 04/30/2024   HGBA1C 5.5 08/27/2015   Lab Results  Component Value Date   INSULIN  8.0 04/30/2024   INSULIN  9.8 09/25/2018   Lab Results  Component Value Date   TSH 1.360 04/30/2024   CBC    Component Value Date/Time   WBC 2.7 (L) 04/30/2024 0945   WBC 3.2 (L) 02/07/2023 1401   RBC 4.36 04/30/2024 0945   RBC 4.49 02/07/2023 1401   HGB 13.1 04/30/2024 0945   HCT 40.6 04/30/2024 0945   PLT 163 04/30/2024 0945   MCV 93 04/30/2024 0945   MCV 92 09/08/2014 2059   MCH 30.0  04/30/2024 0945   MCH 29.4 02/07/2023 1401   MCHC 32.3 04/30/2024 0945   MCHC 32.4 02/07/2023 1401   RDW 13.4 04/30/2024 0945   RDW 14.3 09/08/2014 2059   Iron Studies No results found for: IRON, TIBC, FERRITIN, IRONPCTSAT Lipid Panel     Component Value Date/Time   CHOL 178 04/30/2024 0945   TRIG 53 04/30/2024 0945   HDL 56 04/30/2024 0945   CHOLHDL 3.1 08/23/2022 0950   CHOLHDL 3 08/27/2015 0806   VLDL 11.0 08/27/2015 0806   LDLCALC 112 (H) 04/30/2024 0945   LDLDIRECT 113.0 09/16/2014 1208   Hepatic Function Panel     Component Value Date/Time   PROT 7.2 04/30/2024 0945   PROT 7.1 09/08/2014 2059   ALBUMIN 4.5 04/30/2024 0945   ALBUMIN 4.1 09/08/2014 2059   AST 23 04/30/2024 0945   AST 34 09/08/2014 2059   ALT 19 04/30/2024 0945   ALT 45 09/08/2014 2059   ALKPHOS 51 04/30/2024 0945   ALKPHOS 43 09/08/2014 2059   BILITOT 0.7 04/30/2024 0945   BILITOT 0.5 09/08/2014 2059   BILIDIR 0.2 02/07/2023 1401   IBILI 0.5 02/07/2023 1401      Component Value Date/Time   TSH 1.360 04/30/2024 0945   Nutritional Lab Results  Component Value Date   VD25OH 60.9 04/30/2024   VD25OH 40.3 06/24/2019   VD25OH 36.5 04/19/2018     Assessment and Plan Assessment & Plan Obesity with low metabolism and prediabetes Obesity with low metabolism and prediabetes. Fasting glucose is normal at 86 mg/dL, but hemoglobin J8r is 5.8%, indicating prediabetes. Insulin  level is elevated at 8, suggesting insulin  resistance. Low metabolism likely due to inadequate nutrition and muscle mass. Genetic predisposition to diabetes noted. Emphasized the role of protein in managing insulin  levels and metabolism. Discussed risks of rapid weight loss medications and importance of sustainable lifestyle changes. Highlighted that protein does not elevate insulin  levels, unlike carbohydrates, and is crucial for muscle maintenance and metabolism. - Increase protein intake to 85 grams per day. - Limit  caloric intake to 1100-1200 calories per day. - Focus on portion control, especially with carbohydrates. - Avoid magnesium-containing supplements. - Provided a list of high-protein, low-calorie foods. - Scheduled follow-up appointment in January.  Pure hypercholesterolemia LDL cholesterol is slightly elevated at 112 mg/dL. Other cholesterol levels are within normal range. Discussed potential to lower LDL with dietary changes and weight management. - Will recheck cholesterol levels in 3 months.  Elevated blood pressure with HTN Blood pressure is elevated at 144/75 mmHg and remains elevated at 143/76 mmHg upon repeat measurement. - Continue diet, exercise and weight loss as discussed today as an important part of the treatment plan - Recheck BP in 1 month       Patients who are on anti-obesity medications are counseled on the  importance of maintaining healthy lifestyle habits, including balanced nutrition, regular physical activity, and behavioral modifications,  Medication is an adjunct to, not a replacement for, lifestyle changes and that the long-term success and weight maintenance depend on continued adherence to these strategies.   Di was informed of the importance of frequent follow up visits to maximize her success with intensive lifestyle modifications for her obesity and obesity related health conditions as recommended by USPSTF and CMS guidelines  I personally spent a total of 40 minutes in the care of the patient today including preparing to see the patient, performing a medically appropriate evaluation of current problems, documenting clinical information in the EMR, customized nutritional counseling for their specific health and social needs, independently interpreting results, discussing results with the patient and educating them on how these results can affect their health and weight, and discussing strategies to help avoid social eating challenges.  Louann Penton,  MD

## 2024-05-20 ENCOUNTER — Telehealth: Payer: Self-pay

## 2024-05-20 NOTE — Telephone Encounter (Signed)
 Copied from CRM #[id number]. Topic: Clinical - Lab/Test Results >> May 20, 2024 10:49 AM Nicole Gould wrote: Reason for CRM: Pt calling to get her results from her sleep study. Explained to her that provider has not read results. Pt is ready. She is anxious to get this reading done. She would like a call just for an update

## 2024-05-20 NOTE — Telephone Encounter (Signed)
 Patient had in lab sleep study on 05/06/2024.

## 2024-05-21 ENCOUNTER — Ambulatory Visit: Admitting: Sleep Medicine

## 2024-05-21 DIAGNOSIS — G4733 Obstructive sleep apnea (adult) (pediatric): Secondary | ICD-10-CM

## 2024-05-21 NOTE — Telephone Encounter (Signed)
 Per Dr. Jess- Sleep study shows no OSA. 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 We can check a Feratin and Iron level on her for the periotic limb movement. For the snoring she can try positional therapy, weight loss, or an OTC oral appliance (exp Snore RX).

## 2024-05-21 NOTE — Telephone Encounter (Signed)
 I have notified the patient. She has a lot of questions regarding the sleep study. I scheduled her an appt with Dr. Jess on 12/4.   Nothing further needed.

## 2024-05-22 ENCOUNTER — Ambulatory Visit: Admitting: Sleep Medicine

## 2024-05-22 ENCOUNTER — Encounter: Payer: Self-pay | Admitting: Sleep Medicine

## 2024-05-22 ENCOUNTER — Other Ambulatory Visit
Admission: RE | Admit: 2024-05-22 | Discharge: 2024-05-22 | Disposition: A | Source: Ambulatory Visit | Attending: Sleep Medicine | Admitting: Sleep Medicine

## 2024-05-22 VITALS — BP 120/76 | HR 74 | Temp 97.8°F | Ht 64.0 in | Wt 197.8 lb

## 2024-05-22 DIAGNOSIS — F5104 Psychophysiologic insomnia: Secondary | ICD-10-CM | POA: Diagnosis not present

## 2024-05-22 DIAGNOSIS — I10 Essential (primary) hypertension: Secondary | ICD-10-CM | POA: Diagnosis not present

## 2024-05-22 DIAGNOSIS — R5383 Other fatigue: Secondary | ICD-10-CM | POA: Insufficient documentation

## 2024-05-22 DIAGNOSIS — G4761 Periodic limb movement disorder: Secondary | ICD-10-CM

## 2024-05-22 DIAGNOSIS — Z79899 Other long term (current) drug therapy: Secondary | ICD-10-CM | POA: Insufficient documentation

## 2024-05-22 LAB — IRON AND TIBC
Iron: 40 ug/dL (ref 28–170)
Saturation Ratios: 12 % (ref 10.4–31.8)
TIBC: 349 ug/dL (ref 250–450)
UIBC: 308 ug/dL

## 2024-05-22 LAB — FERRITIN: Ferritin: 138 ng/mL (ref 11–307)

## 2024-05-22 MED ORDER — TRAZODONE HCL 50 MG PO TABS: 50.0000 mg | ORAL_TABLET | Freq: Every day | ORAL | 3 refills | Status: DC

## 2024-05-22 NOTE — Patient Instructions (Signed)
 Will check iron panel and follow up to review results.

## 2024-05-22 NOTE — Progress Notes (Signed)
 Name:Nicole Gould MRN: [id number] DOB: 03-Aug-1960   CHIEF COMPLAINT:  PSG F/U   HISTORY OF PRESENT ILLNESS: Nicole Gould is a 63 y.o. w/ a h/o HTN, obesity and GERD who presents to follow up on PSG results. 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 Reports persistent fatigue and disruptive sleep.     EPWORTH SLEEP SCORE 7    04/01/2024    1:23 PM  Results of the Epworth flowsheet  Sitting and reading 1  Watching TV 3  Sitting, inactive in a public place (e.g. a theatre or a meeting) 2  As a passenger in a car for an hour without a break 0  Lying down to rest in the afternoon when circumstances permit 1  Sitting and talking to someone 0  Sitting quietly after a lunch without alcohol 0  In a car, while stopped for a few minutes in traffic 0  Total score 7    PAST MEDICAL HISTORY :   has a past medical history of Allergy, Anxiety, Back pain, Chicken pox, Chronic Dizziness, Colon polyps, Concussion, Constipation, Frequent headaches, GERD (gastroesophageal reflux disease), History of stress test, Hypertension, Lower extremity edema, Migraines, OSA (obstructive sleep apnea), Palpitations, Pre-diabetes, Pre-eclampsia, Seasonal allergies, Seizure disorder in pregnancy (HCC), Seizures (HCC), Sleep apnea, SOBOE (shortness of breath on exertion), Swallowing difficulty, and Vitamin D  deficiency.  has a past surgical history that includes Tubal ligation; Colonoscopy (2018); Polypectomy (2018); Dilation and curettage of uterus (09/08/2014); and Tonsillectomy. Prior to Admission medications   Medication Sig Start Date End Date Taking? Authorizing Provider  amLODipine  (NORVASC ) 2.5 MG tablet Take 1 tablet (2.5 mg total) by mouth 2 (two) times daily. May take an extra tablet (2.5 MG) daily as needed for systolic blood pressure greater then 140. 11/08/23 11/07/24 Yes Gollan, Timothy J, MD  Apple Cider Vinegar 500 MG TABS Take 500 mg by mouth daily.   Yes [provider]  betamethasone, augmented, (DIPROLENE) 0.05 % lotion Apply 1 application. topically 2 (two) times daily as needed. 08/18/21  Yes [provider]  Bioflavonoid Products (VITAMIN C) CHEW Chew 1 tablet by mouth daily.   Yes [provider]  Blood Glucose Monitoring Suppl (ONETOUCH VERIO FLEX SYSTEM) w/Device KIT as directed. 08/07/23  Yes [provider]  BYSTOLIC  10 MG tablet Take 1 tablet (10 mg total) by mouth daily. 08/17/23  Yes Gollan, Timothy J, MD  Cholecalciferol 50 MCG (2000 UT) TABS Take 1 tablet by mouth daily.   Yes [provider]  Homeopathic Products [facility] COLD REMEDY PO) Take by mouth.   Yes [provider]  Multiple Vitamins-Minerals (MULTIVITAMIN WITH MINERALS) tablet Take 1 tablet by mouth daily.   Yes [provider]  Multiple Vitamins-Minerals (OCUVITE EXTRA PO) Take by mouth daily.   Yes [provider]  Olopatadine-Mometasone (RYALTRIS ) 665-25 MCG/ACT SUSP Place 2 sprays into the nose in the morning and at bedtime. 08/15/23  Yes Padgett, Danita Macintosh, MD  Omega-3 Fatty Acids (FISH OIL) 300 MG CAPS Take 300 mg by mouth 3 (three) times daily.   Yes [provider]  OVER THE COUNTER MEDICATION daily. Nutriful- Hair Growth Vitamin   Yes [provider]  pantoprazole  (PROTONIX ) 40 MG tablet TAKE 1 TABLET BY MOUTH EVERY DAY 10/15/23  Yes Pyrtle, Gordy HERO, MD  Probiotic Product (PROBIOTIC-10 PO) Take 1 capsule by mouth daily.   Yes [provider]  sucralfate  (CARAFATE ) 1 g tablet Take 1 g by  mouth 4 (four) times daily.   Yes [provider]  tretinoin (RETIN-A) 0.025 % cream Apply 1 Application topically at bedtime.   Yes [provider]  Turmeric (QC TUMERIC COMPLEX PO) Take by mouth.   Yes [provider]  acetaminophen  (TYLENOL ) 500 MG tablet Take 1,000 mg by mouth every 6 (six) hours as needed. Patient not taking: Reported on 04/01/2024    [provider]  amoxicillin -clavulanate (AUGMENTIN ) 875-125 MG tablet Take 1 tablet by mouth every 12 (twelve) hours. Patient not taking: Reported on 04/01/2024 11/25/23   Billy Asberry FALCON, PA-C  Atenolol  (TENORMIN  PO) Tenormin  Patient not taking: Reported on 04/01/2024    [provider]  azelastine  (OPTIVAR ) 0.05 % ophthalmic solution Place 1 drop into both eyes 2 (two) times daily as needed (itchy/watery eyes). Patient not taking: Reported on 04/01/2024 11/10/19   Luke Orlan HERO, DO  Azelastine -Fluticasone  137-50 MCG/ACT SUSP 1 spray twice a day as needed for runny or stuffy nose. Patient not taking: Reported on 04/01/2024 01/02/24   Jeneal Danita Macintosh, MD  benzonatate  (TESSALON ) 200 MG capsule Take 1 capsule (200 mg total) by mouth 3 (three) times daily as needed for cough. Patient not taking: Reported on 04/01/2024 07/06/23   Rodriguez-Southworth, Kyra, PA-C  Biotin 1000 MCG tablet Take 1,000 mcg by mouth daily. Patient not taking: Reported on 04/01/2024    [provider]  CINNAMON PO Take 1,000 mg by mouth daily.  Patient not taking: Reported on 04/01/2024    [provider]  desloratadine (CLARINEX) 5 MG tablet TAKE 1 TABLET (5 MG TOTAL) BY MOUTH DAILY. Patient not taking: Reported on 04/01/2024 08/23/23   Jeneal Danita Macintosh, MD  fluconazole  (DIFLUCAN ) 150 MG tablet Take 150 mg by mouth once. Patient not taking: Reported on 04/01/2024 08/10/23   [provider]  fluconazole  (DIFLUCAN ) 150 MG tablet Take 1 tablet (150 mg total) by mouth daily. Patient not taking: Reported on 04/01/2024 11/27/23   Flint Raring K, PA-C  glucose blood test strip See admin instructions. Patient not taking: Reported on 04/01/2024 08/17/21   [provider]  glucose blood test strip AS DIRECTED IN VITRO ONCE A DAY 90 DAYS Patient not taking: Reported on 04/01/2024    [provider]  Homeopathic Products [facility] COLD REMEDY PO) Take by mouth. Patient  not taking: Reported on 04/01/2024    [provider]  HYDROCHLOROTHIAZIDE  PO hydroCHLOROthiazide  Patient not taking: Reported on 04/01/2024    [provider]  HYDROCHLOROTHIAZIDE  PO HCTZ Patient not taking: Reported on 04/01/2024    [provider]  hyoscyamine  (LEVSIN  SL) 0.125 MG SL tablet Take 0.125 mg by mouth every 4 (four) hours as needed for cramping. Patient not taking: Reported on 04/01/2024    [provider]  Lancet Devices [facility] DELICA PLUS LANCING) MISC CHECK BLOOD SUGAR ONCE A DAY 90 DAYS Patient not taking: Reported on 04/01/2024    [provider]  Lancets [facility] DELICA PLUS [place]) MISC SMARTSIG:1 Topical Daily Patient not taking: Reported on 04/01/2024 08/26/21   [provider]  meclizine  (ANTIVERT ) 25 MG tablet Take 25 mg by mouth 3 (three) times daily as needed. Patient not taking: Reported on 04/01/2024    [provider]  minoxidil (LONITEN) 2.5 MG tablet Take 2.5 mg by mouth as directed.  Take 1/2 tab by mouth daily Patient not taking: Reported on 04/01/2024 04/26/23   [provider]  ondansetron  (ZOFRAN -ODT) 8 MG disintegrating tablet Take 8 mg by mouth every  8 (eight) hours as needed. Patient not taking: Reported on 04/01/2024    [provider]  [facility] VERIO test strip 1 each daily. Patient not taking: Reported on 04/01/2024 08/26/21   [provider]  oseltamivir  (TAMIFLU ) 75 MG capsule Take 1 capsule (75 mg total) by mouth every 12 (twelve) hours. Patient not taking: Reported on 04/01/2024 07/06/23   Rodriguez-Southworth, Kyra, PA-C  pantoprazole  (PROTONIX ) 40 MG tablet Take 40 mg by mouth daily. Patient not taking: Reported on 04/01/2024    [provider]  potassium chloride  SA (KLOR-CON  M20) 20 MEQ tablet Take 20 mEq by mouth daily. Patient not taking: Reported on 04/01/2024    [provider]  Semaglutide-Weight Management (WEGOVY) 0.25  MG/0.5ML SOAJ Inject 0.25 mg into the skin once a week. Patient not taking: Reported on 04/01/2024    [provider]  Turmeric POWD Take 1 tablet by mouth See admin instructions. Patient not taking: Reported on 04/01/2024    [provider]   Allergies  Allergen Reactions   Losartan  Shortness Of Breath    Dizziness,  Shortness of breath    Anesthetics, Amide Nausea And Vomiting   Benadryl [Diphenhydramine Hcl (Sleep)]     Made feel paralyzed when had benadryl with a cocktail for procedure  Decadron , bendayl, regaln combo at [facility] 2014- see care every where    Benicar [Olmesartan]     Presyncopal , light headed    Cardura  [Doxazosin  Mesylate] Other (See Comments)    makes me feel terrible   Fentanyl Hypertension    Severe htn    Hydrochlorothiazide      Other Reaction(s): dizziness   Losartan  Potassium-Hctz     Dizziness,  Shortness of breath    Metformin And Related     out of body experience     Oseltamivir      Other Reaction(s): vertigo   Other     WEGOVY  Other Reaction(s): nausea & vomiting    Prilocaine Nausea And Vomiting   Semaglutide Other (See Comments)    , Pain in the chest and dizziness    FAMILY HISTORY:  family history includes Breast cancer (age of onset: 58 - 14) in her cousin; Cancer in her father; Colon cancer in her maternal grandfather; Diabetes in her father; Heart disease in her father; Hypertension in her father and mother; Kidney disease in her father. SOCIAL HISTORY:  reports that she has never smoked. She has been exposed to tobacco smoke. She has never used smokeless tobacco. She reports current alcohol use of about 1.0 standard drink of alcohol per week. She reports that she does not use drugs.   Review of Systems:  Gen:  Denies  fever, sweats, chills weight loss  HEENT: Denies blurred vision, double vision, ear pain, eye pain, hearing loss, nose bleeds, sore throat Cardiac:  No dizziness, chest pain or heaviness, chest  tightness,edema, No JVD Resp:   No cough, -sputum production, -shortness of breath,-wheezing, -hemoptysis,  Gi: Denies swallowing difficulty, stomach pain, nausea or vomiting, diarrhea, constipation, bowel incontinence Gu:  Denies bladder incontinence, burning urine Ext:   Denies Joint pain, stiffness or swelling Skin: Denies  skin rash, easy bruising or bleeding or hives Endoc:  Denies polyuria, polydipsia , polyphagia or weight change Psych:   Denies depression, insomnia or hallucinations  Other:  All other systems negative  VITAL SIGNS: BP 120/76   Pulse 74   Temp 97.8 F (36.6 C)   Ht 5' 4 (1.626 m)   Wt 197 lb  12.8 oz (89.7 kg)   LMP 12/01/2013 (Approximate)   SpO2 98%   BMI 33.95 kg/m    Physical Examination:   General Appearance: No distress  EYES PERRLA, EOM intact.   NECK Supple, No JVD Pulmonary: normal breath sounds, No wheezing.  CardiovascularNormal S1,S2.  No m/r/g.   Abdomen: Benign, Soft, non-tender. Skin:   warm, no rashes, no ecchymosis  Extremities: normal, no cyanosis, clubbing. Neuro:without focal findings,  speech normal  PSYCHIATRIC: Mood, affect within normal limits.   ASSESSMENT AND PLAN  PLMD Will further evaluate PLMD with iron panel. If ferritin <50, will recommend OTC iron supplements.     Insomnia I suspect that daytime sleepiness and fatigue is secondary to fragmented sleep. Counseled patient on stimulus control and improving sleep hygiene practices. Will also try patient on Trazodone  25 mg nightly.    Patient  satisfied with Plan of action and management. All questions answered  I spent a total of 27 minutes reviewing chart data, face-to-face evaluation with the patient, counseling and coordination of care as detailed above.    Aditri Louischarles, M.D.  Sleep Medicine [facility]

## 2024-05-23 ENCOUNTER — Encounter: Payer: Self-pay | Admitting: Gastroenterology

## 2024-05-23 ENCOUNTER — Other Ambulatory Visit

## 2024-05-23 ENCOUNTER — Ambulatory Visit: Admitting: Gastroenterology

## 2024-05-23 VITALS — BP 118/68 | HR 74 | Ht 64.0 in | Wt 194.0 lb

## 2024-05-23 DIAGNOSIS — R195 Other fecal abnormalities: Secondary | ICD-10-CM

## 2024-05-23 DIAGNOSIS — Z860100 Personal history of colon polyps, unspecified: Secondary | ICD-10-CM

## 2024-05-23 DIAGNOSIS — Z800 Family history of malignant neoplasm of digestive organs: Secondary | ICD-10-CM

## 2024-05-23 DIAGNOSIS — K219 Gastro-esophageal reflux disease without esophagitis: Secondary | ICD-10-CM

## 2024-05-23 DIAGNOSIS — K5900 Constipation, unspecified: Secondary | ICD-10-CM

## 2024-05-23 DIAGNOSIS — R053 Chronic cough: Secondary | ICD-10-CM

## 2024-05-23 DIAGNOSIS — Z860101 Personal history of adenomatous and serrated colon polyps: Secondary | ICD-10-CM

## 2024-05-23 MED ORDER — SUCRALFATE 1 G PO TABS: 1.0000 g | ORAL_TABLET | Freq: Four times a day (QID) | ORAL | 1 refills | Status: AC

## 2024-05-23 MED ORDER — PANTOPRAZOLE SODIUM 40 MG PO TBEC: 40.0000 mg | DELAYED_RELEASE_TABLET | Freq: Every day | ORAL | 3 refills | Status: AC

## 2024-05-23 NOTE — Patient Instructions (Addendum)
 Your provider has requested that you go to the basement level for lab work before leaving today. Press B on the elevator. The lab is located at the first door on the left as you exit the elevator.  We have sent the following medications to your pharmacy for you to pick up at your convenience:  Pantoprazole , Carafate   Follow the instructions on the Hemoccult cards and mail them back to us  when you are finished or you may take them directly to the lab in the basement of the [place] building. We will call you with the results.     VISIT SUMMARY:  During your visit, we discussed your recent gastrointestinal symptoms, concerns about your high protein diet, and persistent cough. We reviewed your history of polyps and current medications. We have developed a plan to address your symptoms and monitor your health.  YOUR PLAN:  -CONSTIPATION: Constipation is difficulty in passing stools, often due to a high protein diet. The blood in your stool is likely from straining or hard stools. Your recent colonoscopy showed no polyps. We recommend taking a fiber supplement like Benefiber or Metamucil, and using Miralax if the fiber supplement is not enough. A squatty potty can also help with bowel movements. We have ordered a fecal calprotectin test to check for inflammation and serial hemoccult tests to check for blood in your stool.  -GASTROESOPHAGEAL REFLUX DISEASE (GERD): GERD is a condition where stomach acid frequently flows back into the tube connecting your mouth and stomach. Your GERD is managed with pantoprazole , which you may need to take twice daily. Your coughing spells might be related to reflux. Continue taking pantoprazole  and consider increasing the dose to twice daily. We have referred you to a pulmonologist to evaluate your persistent cough.  -HISTORY OF COLONIC POLYPS: Colonic polyps are growths on the lining of your colon. Your recent colonoscopy in 2023 showed no polyps, and the recent blood in  your stool is likely due to hemorrhoids. The likelihood of developing polyps within two years is low. Continue to monitor for any symptoms of bleeding or changes in bowel habits. We will consider another colonoscopy if your fecal calprotectin levels are elevated.  INSTRUCTIONS:  Please follow up with the pulmonologist for your persistent cough. Continue monitoring your symptoms and follow the recommendations provided for managing constipation and GERD. If you notice any new or worsening symptoms, please contact our office.

## 2024-05-23 NOTE — Progress Notes (Signed)
 Chief Complaint: Med refill Primary GI MD: Dr. Albertus  HPI: 63 year old female past medical history as listed below presents for medication refill  10/15/2019 EGD with normal esophagus and benign fundic gland polyps, biopsies showed chronic inactive gastritis.  Mild duodenal bulb erythema without ulceration or erosion.    02/01/2021 patient seen in clinic for costochondritis.    09/15/2021 colonoscopy normal.  Repeat recommended 10 years.    08/23/2022 CBC and CMP normal.    09/2022: Seen by Delon Failing, PA-C for acute diarrhea/nausea/abdominal pain.  Workup at that time showed fecal calprotectin 5-16, negative stool culture, C. difficile unable to be completed.  Negative ova parasite.  CBC, CMP, lipase were normal.  Patient was put on pantoprazole  40 mg twice daily, hyoscyamine    Discussed the use of AI scribe software for clinical note transcription with the patient, who gave verbal consent to proceed.  History of Present Illness  Nicole Gould is a 63 year old female with a history of polyps who presents with gastrointestinal symptoms and concerns about her high protein diet.  She experienced a recent episode of gastrointestinal distress, characterized by a sudden, uncontrollable bowel movement that was 'extremely lubricated', followed by another similar episode. Later that day, she had a small, pebble-like stool accompanied by mucus and blood-tinged mucus. She is concerned due to her history of polyps, which were previously identified as potentially cancerous.  She is currently on a high protein diet as part of a weight loss program at [facility]. She recalls a past experience with a high protein diet that led to the discovery of eight polyps during a colonoscopy, which were removed. She is worried about the recurrence of polyps due to her current diet, which includes a significant amount of chicken and previously included nitrate-free deli turkey.  She is taking  pantoprazole , sometimes twice daily, to manage reflux symptoms, and occasionally uses Carafate . She is seeking refills for these medications. She has a history of elevated fecal calprotectin and is concerned about inflammation in her colon.  She reports a persistent cough since January, following a severe flu-like illness. The cough occurs primarily at night and is sometimes triggered by a tickle in her throat, leading to episodes where she feels like she might vomit if she doesn't have water. She questions whether this could be related to the flu or possibly COVID-19, as she has not experienced such symptoms before.    PREVIOUS GI WORKUP   Colonoscopy 08/2021 -Normal -Diverticulosis -Repeat 08/2026 secondary to family history of colon cancer  EGD for GERD 09/2019 - Normal esophagus. Biopsied.  - A few benign gastric polyps.  - Stomach otherwise normal. Biopsies performed for H. Pylori testing.  - Mild duodenitis.  - Normal second portion of the duodenum.  Diagnosis 1. Surgical [P], gastric antrum and gastric body - CHRONIC INACTIVE GASTRITIS. - THERE IS NO EVIDENCE OF HELICOBACTER PYLORI, DYSPLASIA, OR MALIGNANCY. - SEE COMMENT. 2. Surgical [P], mid esophagus and proximal esophagus - BENIGN SQUAMOUS MUCOSA. - THERE IS NO EVIDENCE OF SIGNIFICANT INCREASE IN EOSINOPHILS, GOBLET CELL METAPLASIA, DYSPLASIA, OR MALIGNANCY  Past Medical History:  Diagnosis Date   Allergy    Anxiety    Back pain    Chicken pox    Chronic Dizziness    Colon polyps    Concussion    Constipation    Frequent headaches    GERD (gastroesophageal reflux disease)    on meds   History of stress test    a.  2005 - in setting of ectopy - reportedly nl.   Hypertension    a. 03/2018 24hr ABM: Mean BP 146/75, mean HR 61; Mean awake BP/HR 150/78, 62; Mean sleep BP/HR 130/65, 58.   Lower extremity edema    Migraines    OSA (obstructive sleep apnea)    No Cpap at this time   Palpitations    Pre-diabetes     Pre-eclampsia    with grand mal seizures   Seasonal allergies    Seizure disorder in pregnancy (HCC)    Seizures (HCC)    with pregnancy 1981- pre clampsia    Sleep apnea    SOBOE (shortness of breath on exertion)    Swallowing difficulty    Vitamin D  deficiency     Past Surgical History:  Procedure Laterality Date   COLONOSCOPY  2018   Pyrtle-MAC-suprep(exc)-HPP   DILATION AND CURETTAGE OF UTERUS  09/08/2014   with hystereoscopy   POLYPECTOMY  2018   HPP   TONSILLECTOMY     TUBAL LIGATION      Current Outpatient Medications  Medication Sig Dispense Refill   amLODipine  (NORVASC ) 2.5 MG tablet Take 1 tablet (2.5 mg total) by mouth 2 (two) times daily. May take an extra tablet (2.5 MG) daily as needed for systolic blood pressure greater then 140. 270 tablet 3   azelastine  (OPTIVAR ) 0.05 % ophthalmic solution Place 1 drop into both eyes 2 (two) times daily as needed (itchy/watery eyes). 6 mL 5   Azelastine -Fluticasone  137-50 MCG/ACT SUSP 1 spray twice a day as needed for runny or stuffy nose. 23 g 2   Blood Glucose Monitoring Suppl (ONETOUCH VERIO FLEX SYSTEM) w/Device KIT as directed.     BYSTOLIC  10 MG tablet Take 1 tablet (10 mg total) by mouth daily. 90 tablet 3   Cholecalciferol 50 MCG (2000 UT) TABS Take 1 tablet by mouth daily.     CINNAMON PO Take 1,000 mg by mouth daily.      glucose blood test strip See admin instructions.     Homeopathic Products (ZICAM COLD REMEDY PO) Take by mouth.     Lancets (ONETOUCH DELICA PLUS LANCET33G) MISC SMARTSIG:1 Topical Daily     Multiple Vitamins-Minerals (OCUVITE EXTRA PO) Take by mouth daily.     Olopatadine-Mometasone (RYALTRIS ) 665-25 MCG/ACT SUSP Place 2 sprays into the nose in the morning and at bedtime. 29 g 5   OVER THE COUNTER MEDICATION daily. Nutriful- Hair Growth Vitamin     Probiotic Product (PROBIOTIC-10 PO) Take 1 capsule by mouth daily.     Turmeric (QC TUMERIC COMPLEX PO) Take by mouth.     Apple Cider Vinegar 500 MG  TABS Take 500 mg by mouth daily. (Patient not taking: Reported on 05/23/2024)     Bioflavonoid Products (VITAMIN C) CHEW Chew 1 tablet by mouth daily. (Patient not taking: Reported on 05/23/2024)     HYDROCHLOROTHIAZIDE  PO HCTZ (Patient not taking: Reported on 05/23/2024)     hyoscyamine  (LEVSIN  SL) 0.125 MG SL tablet Take 0.125 mg by mouth every 4 (four) hours as needed for cramping. (Patient not taking: Reported on 05/23/2024)     Multiple Vitamins-Minerals (MULTIVITAMIN WITH MINERALS) tablet Take 1 tablet by mouth daily. (Patient not taking: Reported on 05/22/2024)     Omega-3 Fatty Acids (FISH OIL) 300 MG CAPS Take 300 mg by mouth 3 (three) times daily. (Patient not taking: Reported on 05/23/2024)     pantoprazole  (PROTONIX ) 40 MG tablet Take 1 tablet (40 mg total) by mouth  daily. 90 tablet 3   sucralfate  (CARAFATE ) 1 g tablet Take 1 tablet (1 g total) by mouth 4 (four) times daily. 360 tablet 1   traZODone  (DESYREL ) 50 MG tablet Take 1 tablet (50 mg total) by mouth at bedtime. (Patient not taking: Reported on 05/23/2024) 30 tablet 3   tretinoin (RETIN-A) 0.025 % cream Apply 1 Application topically at bedtime. (Patient not taking: Reported on 05/23/2024)     No current facility-administered medications for this visit.    Allergies as of 05/23/2024 - Review Complete 05/23/2024  Allergen Reaction Noted   Losartan  Shortness Of Breath 04/30/2014   Anesthetics, amide Nausea And Vomiting 03/31/2014   Benadryl [diphenhydramine hcl (sleep)]  05/03/2016   Benicar [olmesartan]  12/10/2017   Cardura  [doxazosin  mesylate] Other (See Comments) 01/14/2018   Fentanyl Hypertension 05/03/2016   Hydrochlorothiazide   07/06/2023   Losartan  potassium-hctz  04/30/2014   Metformin and related  12/10/2017   Oseltamivir   11/25/2023   Other  03/16/2023   Prilocaine Nausea And Vomiting 03/31/2014   Semaglutide Other (See Comments) 04/05/2023    Family History  Problem Relation Age of Onset   Hypertension Mother     Cancer Father    Heart disease Father    Kidney disease Father    Hypertension Father    Diabetes Father    Colon cancer Maternal Grandfather        passed age 36   Breast cancer Cousin 13 - 49   Colon polyps Neg Hx    Esophageal cancer Neg Hx    Rectal cancer Neg Hx    Stomach cancer Neg Hx     Social History   Socioeconomic History   Marital status: Married    Spouse name: Oneil   Number of children: Not on file   Years of education: Not on file   Highest education level: Not on file  Occupational History   Not on file  Tobacco Use   Smoking status: Never    Passive exposure: Past (Dad)   Smokeless tobacco: Never  Vaping Use   Vaping status: Never Used  Substance and Sexual Activity   Alcohol use: Yes    Alcohol/week: 1.0 standard drink of alcohol    Types: 1 Glasses of wine per week    Comment: Rarely.   Drug use: No   Sexual activity: Yes    Partners: Male  Other Topics Concern   Not on file  Social History Narrative   Not on file   Social Drivers of Health   Financial Resource Strain: Not on file  Food Insecurity: Not on file  Transportation Needs: Not on file  Physical Activity: Not on file  Stress: Not on file  Social Connections: Not on file  Intimate Partner Violence: Not on file    Review of Systems:    Constitutional: No weight loss, fever, chills, weakness or fatigue HEENT: Eyes: No change in vision               Ears, Nose, Throat:  No change in hearing or congestion Skin: No rash or itching Cardiovascular: No chest pain, chest pressure or palpitations   Respiratory: No SOB or cough Gastrointestinal: See HPI and otherwise negative Genitourinary: No dysuria or change in urinary frequency Neurological: No headache, dizziness or syncope Musculoskeletal: No new muscle or joint pain Hematologic: No bleeding or bruising Psychiatric: No history of depression or anxiety    Physical Exam:  Vital signs: BP 118/68   Pulse 74   Ht  5' 4 (1.626  m)   Wt 194 lb (88 kg)   LMP 12/01/2013 (Approximate)   BMI 33.30 kg/m   Constitutional: NAD, alert and cooperative Head:  Normocephalic and atraumatic. Eyes:   PEERL, EOMI. No icterus. Conjunctiva pink. Respiratory: Respirations even and unlabored. Lungs clear to auscultation bilaterally.   No wheezes, crackles, or rhonchi.  Cardiovascular:  Regular rate and rhythm. No peripheral edema, cyanosis or pallor.  Gastrointestinal:  Soft, nondistended, nontender. No rebound or guarding. hypoactive bowel sounds. No appreciable masses or hepatomegaly. Rectal:  Declines Msk:  Symmetrical without gross deformities. Without edema, no deformity or joint abnormality.  Neurologic:  Alert and  oriented x4;  grossly normal neurologically.  Skin:   Dry and intact without significant lesions or rashes. Psychiatric: Oriented to person, place and time. Demonstrates good judgement and reason without abnormal affect or behaviors.  Physical Exam    RELEVANT LABS AND IMAGING: CBC    Component Value Date/Time   WBC 2.7 (L) 04/30/2024 0945   WBC 3.2 (L) 02/07/2023 1401   RBC 4.36 04/30/2024 0945   RBC 4.49 02/07/2023 1401   HGB 13.1 04/30/2024 0945   HCT 40.6 04/30/2024 0945   PLT 163 04/30/2024 0945   MCV 93 04/30/2024 0945   MCV 92 09/08/2014 2059   MCH 30.0 04/30/2024 0945   MCH 29.4 02/07/2023 1401   MCHC 32.3 04/30/2024 0945   MCHC 32.4 02/07/2023 1401   RDW 13.4 04/30/2024 0945   RDW 14.3 09/08/2014 2059   LYMPHSABS 1.2 04/30/2024 0945   MONOABS 0.2 10/06/2022 0918   EOSABS 0.0 04/30/2024 0945   BASOSABS 0.0 04/30/2024 0945    CMP     Component Value Date/Time   NA 141 04/30/2024 0945   NA 143 09/08/2014 2059   K 4.3 04/30/2024 0945   K 3.8 09/08/2014 2059   CL 104 04/30/2024 0945   CL 108 09/08/2014 2059   CO2 25 04/30/2024 0945   CO2 26 09/08/2014 2059   GLUCOSE 86 04/30/2024 0945   GLUCOSE 97 02/12/2023 1653   GLUCOSE 94 09/08/2014 2059   BUN 14 04/30/2024 0945   BUN 14  09/08/2014 2059   CREATININE 0.72 04/30/2024 0945   CREATININE 0.72 09/08/2014 2059   CALCIUM 9.4 04/30/2024 0945   CALCIUM 9.4 09/08/2014 2059   PROT 7.2 04/30/2024 0945   PROT 7.1 09/08/2014 2059   ALBUMIN 4.5 04/30/2024 0945   ALBUMIN 4.1 09/08/2014 2059   AST 23 04/30/2024 0945   AST 34 09/08/2014 2059   ALT 19 04/30/2024 0945   ALT 45 09/08/2014 2059   ALKPHOS 51 04/30/2024 0945   ALKPHOS 43 09/08/2014 2059   BILITOT 0.7 04/30/2024 0945   BILITOT 0.5 09/08/2014 2059   GFRNONAA >60 02/12/2023 1653   GFRNONAA >60 09/08/2014 2059   GFRAA >60 08/26/2019 1542   GFRAA >60 09/08/2014 2059     Assessment/Plan:   GERD EGD 2021 with mild duodenitis, normal stomach, normal esophagus, benign gastric polyps and biopsies negative for H. pylori and Barrett's esophagus.  Maintained on pantoprazole  40 mg once daily most days. Sometimes needs BID and carafate  during flares -- refill pantoprazole  40mg  twice daily x 30 days with 11 refills. Advised of PPI risks. Normal kidneys. -- refill carafate , advised of constipation side effects -- educated patient on lifestyle modifications and provided patient education handouts.  Elevated fecal calprotectin Fecal calprotectin 516 09/2022 in the setting of acute diarrhea thought to be infectious at that time with negative stool culture, negative  ova/parasite, C. difficile unable to be completed. - repeat fecal calprotectin  Constipation Constipation since starting high-protein diet while undergoing diet will going through  [place] weight loss. One episode of blood tinged mucus with pebble like stool yesterday (she showed a picture which showed very minimal bright red tinged mucus).  Suspect high-protein diet leading to constipation and straining resulting in 1 episode of blood-tinged mucus yesterday - Increase water, increase fiber, increase exercise - MiraLAX 1 capful daily as needed - We will repeat fecal calprotectin as it was elevated 09/2022 -  Up-to-date on colonoscopy - Serial Hemoccults - Follow-up with me in 8 weeks - If fecal calprotectin is elevated we will pursue colonoscopy early  Chronic cough Chronic coughing fits since respiratory infection January 2025.  Not associated with eating.  Random throughout the day and worse at nighttime.  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  Colonoscopy 08/2021: Normal, repeat 5 years due to family history of colon cancer - Repeat 08/2026  Nestor Blower, PA-C [facility] 05/23/2024, 12:12 PM  Cc: Cleotilde Planas, MD

## 2024-05-26 ENCOUNTER — Ambulatory Visit: Payer: Self-pay

## 2024-06-02 ENCOUNTER — Other Ambulatory Visit

## 2024-06-02 ENCOUNTER — Other Ambulatory Visit: Payer: Self-pay | Admitting: Cardiovascular Disease

## 2024-06-02 DIAGNOSIS — K219 Gastro-esophageal reflux disease without esophagitis: Secondary | ICD-10-CM

## 2024-06-02 DIAGNOSIS — R195 Other fecal abnormalities: Secondary | ICD-10-CM

## 2024-06-03 ENCOUNTER — Ambulatory Visit

## 2024-06-03 DIAGNOSIS — R195 Other fecal abnormalities: Secondary | ICD-10-CM

## 2024-06-03 DIAGNOSIS — K219 Gastro-esophageal reflux disease without esophagitis: Secondary | ICD-10-CM | POA: Diagnosis not present

## 2024-06-04 ENCOUNTER — Other Ambulatory Visit (HOSPITAL_COMMUNITY): Payer: Self-pay

## 2024-06-04 ENCOUNTER — Ambulatory Visit: Payer: Self-pay | Admitting: Gastroenterology

## 2024-06-04 ENCOUNTER — Other Ambulatory Visit: Payer: Self-pay

## 2024-06-04 ENCOUNTER — Telehealth: Payer: Self-pay | Admitting: Cardiovascular Disease

## 2024-06-04 ENCOUNTER — Other Ambulatory Visit: Payer: Self-pay | Admitting: Cardiovascular Disease

## 2024-06-04 LAB — HEMOCCULT SLIDES (X 3 CARDS)
Fecal Occult Blood: NEGATIVE
OCCULT 1: NEGATIVE
OCCULT 2: NEGATIVE
OCCULT 3: NEGATIVE
OCCULT 4: NEGATIVE
OCCULT 5: NEGATIVE

## 2024-06-04 MED ORDER — BYSTOLIC 10 MG PO TABS
10.0000 mg | ORAL_TABLET | Freq: Every day | ORAL | 0 refills | Status: DC
  Filled 2024-06-04 – 2024-06-05 (×2): qty 10, 10d supply, fill #0

## 2024-06-04 MED ORDER — NEBIVOLOL HCL 10 MG PO TABS
10.0000 mg | ORAL_TABLET | Freq: Every day | ORAL | 0 refills | Status: DC
  Filled 2024-06-04: qty 5, 5d supply, fill #0

## 2024-06-04 NOTE — Telephone Encounter (Signed)
 Pt c/o medication issue:  1. Name of Medication:   BYSTOLIC  10 MG tablet   2. How are you currently taking this medication (dosage and times per day)?     3. Are you having a reaction (difficulty breathing--STAT)?   4. What is your medication issue?   Patient stated [phone] [place], [place] - [address] told her this medication will need a pre-authorization for this medication.  Patient wants a call back regarding next steps as she will be going out of town next week.

## 2024-06-04 NOTE — Telephone Encounter (Signed)
 Returned pt's call; identity verified using 2 identifiers; Pt calling to find out how long it will take to have medications Pre-Authorization approved.  She is going out of town for the holidays and will not have enough medication for her blood pressure to last the entire trip.  She stated that she had check with [facility] and they had the medication in stock, but was going to charge her over $100 for 5 tablets.  I checked with [facility] and they would have to order the brand Bystolic , 10 tablets would cost pt $45.  I sent the pre-authorization [facility], but I also sent in a prescription for 10 tablets to cover her trip in the event that pre-authorization is not approved prior to her leaving town.  Pt was very appreciative to have an option for her to have her medications while she is out of town.

## 2024-06-04 NOTE — Telephone Encounter (Signed)
°*  STAT* If patient is at the pharmacy, call can be transferred to refill team.   1. Which medications need to be refilled? (please list name of each medication and dose if known)   BYSTOLIC  10 MG tablet     2. Would you like to learn more about the convenience, safety, & potential cost savings by using the [facility]? No    3. Are you open to using the [facility] (Type [facility].  ). No    4. Which pharmacy/location (including street and city if local pharmacy) is medication to be sent to? [id number] [place], [place] - [address]     5. Do they need a 30 day or 90 day supply? 90 day    Pt is out of medication

## 2024-06-04 NOTE — Addendum Note (Signed)
 Addended by: HARL HERON DEL on: 06/04/2024 04:47 PM   Modules accepted: Orders

## 2024-06-04 NOTE — Telephone Encounter (Signed)
Refill was already sent today

## 2024-06-05 ENCOUNTER — Telehealth: Payer: Self-pay | Admitting: Pharmacy Technician

## 2024-06-05 ENCOUNTER — Ambulatory Visit: Admitting: Sleep Medicine

## 2024-06-05 ENCOUNTER — Other Ambulatory Visit: Payer: Self-pay | Admitting: *Deleted

## 2024-06-05 ENCOUNTER — Other Ambulatory Visit (HOSPITAL_COMMUNITY): Payer: Self-pay

## 2024-06-05 MED ORDER — BYSTOLIC 10 MG PO TABS: 10.0000 mg | ORAL_TABLET | Freq: Every day | ORAL | 3 refills | Status: DC

## 2024-06-05 NOTE — Telephone Encounter (Signed)
 Bystolic  is approved on her insurance until march 2028. I [facility] and [facility] [place]  [facility]. [facility] [place]  and had them [facility] the claim. [facility] and asked them to fill the bystolic . They only had a 30 day on file. I asked the office to send 90 days to the pharmacy. They have to order it and it should be there tomorrow. I asked our pharmacy to hold the 10 tablets for now.       90 days was sent in. [facility] and asked [facility] to [facility] the 90 days instead of the 30. [facility] said [facility] bystolic  is 15.00 for 90 days-on order for tomorrow

## 2024-06-05 NOTE — Telephone Encounter (Signed)
 Bystolic  is approved on her insurance until march 2028. I [facility] and [facility] [place]  [facility]. [facility] [place]  and had them [facility] the claim. [facility] and asked [facility] to fill the bystolic . They only had a 30 day on file. I asked the office to send 90 days to the pharmacy. They have to order it and it should be there tomorrow. I asked our pharmacy to hold the 10 tablets for now. [facility] bystolic  is 15.00 for 90 days.

## 2024-06-06 ENCOUNTER — Telehealth: Payer: Self-pay | Admitting: Cardiovascular Disease

## 2024-06-06 NOTE — Telephone Encounter (Signed)
 Pt called in stating her PCP wants to prescribe her minoxidil and she wants to know if this is okay to take since she is on a blood thinner.

## 2024-06-06 NOTE — Telephone Encounter (Signed)
 Returned pt's call; identified pt using 2 identifiers:pt's main reason for  calling was to ask if it was safe for her to start taking Minoxidil for hair loss since it causes BP to drop; pt states that if possible she would like for Dr. Gollan to consider her taking the Minoxidil instead of her Amlodipine  because she feels that she has to take it 3 times a day.  I educated the pt regarding her prescription for Amlodipine :  She is prescribed 2.5 mg tablet twice daily; she may take a third tablet if pt's systolic pressure is >140 as needed.  She stated that she understood that and that recently she has had to take the 3rd tablet. She also stated that her lower extremities have started to swell and she doesn't like that side effect of Amlodipine .  Pt also states she has been waking up with a headache every morning since September,  but her blood pressure has been good.  I suggested that it could be her sleeping position.  Pt stated that she just bought a new pillow and she will see if it helps.  Pt also wanted to updated Dr. Gollan that she had a sleep study and it was determined that she is negative for sleep apnea.  Pt has a clinic appointment scheduled for February 2026.

## 2024-06-08 LAB — CALPROTECTIN: Calprotectin: 83 ug/g

## 2024-06-09 ENCOUNTER — Other Ambulatory Visit: Payer: Self-pay | Admitting: Family Medicine

## 2024-06-09 DIAGNOSIS — R519 Headache, unspecified: Secondary | ICD-10-CM

## 2024-06-14 ENCOUNTER — Other Ambulatory Visit: Payer: Self-pay | Admitting: Sleep Medicine

## 2024-06-14 DIAGNOSIS — G4733 Obstructive sleep apnea (adult) (pediatric): Secondary | ICD-10-CM

## 2024-06-14 DIAGNOSIS — F5104 Psychophysiologic insomnia: Secondary | ICD-10-CM

## 2024-06-14 DIAGNOSIS — G4761 Periodic limb movement disorder: Secondary | ICD-10-CM

## 2024-06-16 NOTE — Telephone Encounter (Signed)
 Spoke with the patient and made her aware of the recommendations. The patient stated she is currently taking amlodipine  2.5 mg--two tablets in the morning (totaling 5 mg) and one tablet in the evening. She reported that this regimen is working well and helping to control her blood pressure. The patient stated she has noticed an increase in her blood pressure when her weight exceeds 190 lbs. She noted that she is currently enrolled in a weight loss program. Regarding leg swelling, the patient reported it occurs only occasionally and stated she is not overly concerned, as she feels weight loss will help improve the symptoms.  Will forward to MD to make aware   Would not expect a big drop in blood pressure with minoxidil for hair loss Noted large enough or strong enough dose that gets through the scalp   If amlodipine  7.5 daily causing leg swelling would drop back to 5 mg daily We could make small increase on the bystolic   Was on 10 mg daily, could increase up to 15 Thx TGollan

## 2024-06-16 NOTE — Progress Notes (Unsigned)
" °  Pt not seen by Glora Hulgan, PA-C Pt seen by [facility] [place], NP-C "

## 2024-06-17 ENCOUNTER — Ambulatory Visit (INDEPENDENT_AMBULATORY_CARE_PROVIDER_SITE_OTHER): Admitting: Adult Health

## 2024-06-17 ENCOUNTER — Encounter (INDEPENDENT_AMBULATORY_CARE_PROVIDER_SITE_OTHER): Payer: Self-pay | Admitting: Adult Health

## 2024-06-17 VITALS — BP 116/68 | HR 58 | Temp 97.9°F | Ht 64.0 in | Wt 193.0 lb

## 2024-06-17 DIAGNOSIS — R7303 Prediabetes: Secondary | ICD-10-CM | POA: Diagnosis not present

## 2024-06-17 DIAGNOSIS — E7800 Pure hypercholesterolemia, unspecified: Secondary | ICD-10-CM | POA: Diagnosis not present

## 2024-06-17 DIAGNOSIS — E559 Vitamin D deficiency, unspecified: Secondary | ICD-10-CM | POA: Diagnosis not present

## 2024-06-17 DIAGNOSIS — Z6833 Body mass index (BMI) 33.0-33.9, adult: Secondary | ICD-10-CM | POA: Diagnosis not present

## 2024-06-17 DIAGNOSIS — I10 Essential (primary) hypertension: Secondary | ICD-10-CM | POA: Diagnosis not present

## 2024-06-17 DIAGNOSIS — E669 Obesity, unspecified: Secondary | ICD-10-CM | POA: Diagnosis not present

## 2024-06-18 NOTE — Progress Notes (Signed)
 "    WEIGHT SUMMARY AND BIOMETRICS  Vitals Temp: 97.9 F (36.6 C) BP: 116/68 Pulse Rate: (!) 58 SpO2: 99 %   Anthropometric Measurements Height: 5' 4 (1.626 m) Weight: 193 lb (87.5 kg) BMI (Calculated): 33.11 Weight at Last Visit: 195lb Weight Lost Since Last Visit: 2lb Weight Gained Since Last Visit: 0lb Starting Weight: 192lb Total Weight Loss (lbs): 0 lb (0 kg) Peak Weight: 197lb Waist Measurement : 38 inches   Body Composition  Body Fat %: 43.7 % Fat Mass (lbs): 84.4 lbs Muscle Mass (lbs): 103 lbs Total Body Water (lbs): 76.4 lbs Visceral Fat Rating : 12   Other Clinical Data RMR: 1210 Fasting: no Labs: no Today's Visit #: 3 Starting Date: 04/30/24    Chief Complaint:   OBESITY Nicole Gould is here to discuss her progress with her obesity treatment plan.  She is on the keeping a food journal and adhering to recommended goals of 1100-1200 calories and 85g+ protein and states she is following her eating plan approximately 0 % of the time.  She states she is exercising: None  Interim History:  Dr. Verdon started her on prescribed meal plan of 1100-1200 calories with at least 85g+ protein. She has not been following plan, travelled to [place] holidays.  She has not been exercising regularly.  She reports successful weigh loss in past when she reduced caloric intake and walked daily.  She is frustrated with slow weight loss and would like something to kick start the weight loss.  Reviewed her med hx: Metformin intolerant, this Rx produced an out of body experience. Wegovy intolerant, experienced GI upset and CP- evaluated at local ED- reviewed encounter notes from 02/07/2023 [facility] ED  Dr. Balan/Endocrinology had managed these medications  Patient was counseled on the importance of maintaining healthy lifestyle habits, including balanced nutrition, regular physical activity, and behavioral modifications, while taking antiobesity  medication.   Patient verbalized understanding that medication is an adjunct to, not a replacement for, lifestyle changes and that the long-term success and weight maintenance depend on continued adherence to these strategies.   Discussed the need to increase compliance on prescribed meal plan and resume regular exercise prior to initiating AOMs.  Subjective:   1. Pure hypercholesterolemia Lipid Panel     Component Value Date/Time   CHOL 178 04/30/2024 0945   TRIG 53 04/30/2024 0945   HDL 56 04/30/2024 0945   CHOLHDL 3.1 08/23/2022 0950   CHOLHDL 3 08/27/2015 0806   VLDL 11.0 08/27/2015 0806   LDLCALC 112 (H) 04/30/2024 0945   LDLDIRECT 113.0 09/16/2014 1208   LABVLDL 10 04/30/2024 0945    The 10-year ASCVD risk score (Arnett DK, et al., 2019) is: 6.5%   Values used to calculate the score:     Age: 63 years     Clinically relevant sex: Female     Is Non-Hispanic African American: Yes     Diabetic: No     Tobacco smoker: No     Systolic Blood Pressure: 116 mmHg     Is BP treated: Yes     HDL Cholesterol: 56 mg/dL     Total Cholesterol: 178 mg/dL   She is followed by Cards She is on   Omega-3 Fatty Acids (FISH OIL) 300 MG CAPS Take 300 mg by mouth 3 (three) times daily.    2. Prediabetes Lab Results  Component Value Date   HGBA1C 5.8 (H) 04/30/2024   HGBA1C 5.9 (H) 08/23/2022   HGBA1C 5.6 06/24/2019  She is followed by Dr. Balan/Endocrinology She is not currently on antidiabetic medications  3. Vitamin D  deficiency  Latest Reference Range & Units 04/30/24 09:45  Vitamin D , 25-Hydroxy 30.0 - 100.0 ng/mL 60.9   Vit D Level stable and at goal  4. Primary hypertension Initial BP elevated, recheck much improved at 116/68 She denies acute cardiac sx's at present She is followed by Cardiology She is on amLODipine  (NORVASC ) 2.5 MG tablet  HYDROCHLOROTHIAZIDE  PO  BYSTOLIC  10 MG tablet   Assessment/Plan:   1. Pure hypercholesterolemia (Primary) Limit sat fat  intake Increase lean protein intake and resume regular exercise.  2. Prediabetes Follow MP and resume regular exercise. Consider adding add medication after lifestyle modifications are firmly in place.  3. Vitamin D  deficiency Monitor Labs  4. Primary hypertension Follow MP and resume regular exercise. Limit Na+ intake Resume regular exercise.  5. Obesity, starting BMI 33.1 Increase compliance on prescribed MP to at least 85%  Nicole Gould is not currently in the action stage of change. As such, her goal is to get back to weightloss efforts . She has agreed to keeping a food journal and adhering to recommended goals of 1100-1200 calories and 85g+ protein.   Exercise goals: Older adults should follow the adult guidelines. When older adults cannot meet the adult guidelines, they should be as physically active as their abilities and conditions will allow.  Older adults should do exercises that maintain or improve balance if they are at risk of falling.  Older adults should determine their level of effort for physical activity relative to their level of fitness.  Older adults with chronic conditions should understand whether and how their conditions affect their ability to do regular physical activity safely.  Behavioral modification strategies: increasing lean protein intake, decreasing simple carbohydrates, increasing vegetables, increasing water intake, decreasing liquid calories, decreasing sodium intake, increasing high fiber foods, decreasing eating out, meal planning and cooking strategies, keeping healthy foods in the home, holiday eating strategies , celebration eating strategies, and planning for success.  Nicole Gould has agreed to follow-up with our clinic in 4 weeks. She was informed of the importance of frequent follow-up visits to maximize her success with intensive lifestyle modifications for her multiple health conditions.   Objective:   Blood pressure 116/68, pulse (!) 58,  temperature 97.9 F (36.6 C), height 5' 4 (1.626 m), weight 193 lb (87.5 kg), last menstrual period 12/01/2013, SpO2 99%. Body mass index is 33.13 kg/m.  General: Cooperative, alert, well developed, in no acute distress. HEENT: Conjunctivae and lids unremarkable. Cardiovascular: Regular rhythm.  Lungs: Normal work of breathing. Neurologic: No focal deficits.   Lab Results  Component Value Date   CREATININE 0.72 04/30/2024   BUN 14 04/30/2024   NA 141 04/30/2024   K 4.3 04/30/2024   CL 104 04/30/2024   CO2 25 04/30/2024   Lab Results  Component Value Date   ALT 19 04/30/2024   AST 23 04/30/2024   ALKPHOS 51 04/30/2024   BILITOT 0.7 04/30/2024   Lab Results  Component Value Date   HGBA1C 5.8 (H) 04/30/2024   HGBA1C 5.9 (H) 08/23/2022   HGBA1C 5.6 06/24/2019   HGBA1C 5.7 (H) 09/25/2018   HGBA1C 5.7 (A) 03/21/2018   Lab Results  Component Value Date   INSULIN  8.0 04/30/2024   INSULIN  9.8 09/25/2018   Lab Results  Component Value Date   TSH 1.360 04/30/2024   Lab Results  Component Value Date   CHOL 178 04/30/2024   HDL 56  04/30/2024   LDLCALC 112 (H) 04/30/2024   LDLDIRECT 113.0 09/16/2014   TRIG 53 04/30/2024   CHOLHDL 3.1 08/23/2022   Lab Results  Component Value Date   VD25OH 60.9 04/30/2024   VD25OH 40.3 06/24/2019   VD25OH 36.5 04/19/2018   Lab Results  Component Value Date   WBC 2.7 (L) 04/30/2024   HGB 13.1 04/30/2024   HCT 40.6 04/30/2024   MCV 93 04/30/2024   PLT 163 04/30/2024   Lab Results  Component Value Date   IRON 40 05/22/2024   TIBC 349 05/22/2024   FERRITIN 138 05/22/2024    Attestation Statements:   Reviewed by clinician on day of visit: allergies, medications, problem list, medical history, surgical history, family history, social history, and previous encounter notes.  Time spent on visit including pre-visit chart review and post-visit care and charting was 28 minutes.   I have reviewed the above documentation for  accuracy and completeness, and I agree with the above. -  Nicole Gould d. Nicole Candler, NP-C  "

## 2024-06-20 ENCOUNTER — Ambulatory Visit (INDEPENDENT_AMBULATORY_CARE_PROVIDER_SITE_OTHER): Payer: Self-pay | Admitting: Family Medicine

## 2024-06-23 ENCOUNTER — Encounter (INDEPENDENT_AMBULATORY_CARE_PROVIDER_SITE_OTHER): Payer: Self-pay | Admitting: Adult Health

## 2024-06-23 ENCOUNTER — Telehealth: Payer: Self-pay | Admitting: General Practice

## 2024-06-23 NOTE — Telephone Encounter (Signed)
 Pt called and reports Brain fog and severe insomnia - I didn't think that this medication (Bystolic ) would cause this  - I thought it was the menopause. Lately the brain fog has been getting worse.  Pt endorses faithfully taking her medication because I know how important the heart rate and blood pressure control are.  Today BP and HR: 160/85 - 60 but that because I woke with a pain in my neck Reports yesterday BP: 125/81, says she;'s usually in 130s/80s  Pt reports recently started a Healthy Living program (weight control with diet and exercise) with [facility], (former ortho tech)  Appt made with tomorrow opening to discuss pivoting from Bystolic  to another medication

## 2024-06-23 NOTE — Telephone Encounter (Signed)
 Pt c/o medication issue:  1. Name of Medication: BYSTOLIC  10 MG tablet   2. How are you currently taking this medication (dosage and times per day)?   Take 1 tablet (10 mg total) by mouth daily.    3. Are you having a reaction (difficulty breathing--STAT)? No   4. What is your medication issue? Pt has been having severe insomnia and brain fog. She believes it may be related to medication. Please advise.

## 2024-06-24 ENCOUNTER — Encounter: Payer: Self-pay | Admitting: Cardiovascular Disease

## 2024-06-24 ENCOUNTER — Ambulatory Visit: Attending: Cardiovascular Disease | Admitting: Cardiovascular Disease

## 2024-06-24 VITALS — BP 130/72 | HR 57 | Ht 64.0 in | Wt 193.0 lb

## 2024-06-24 DIAGNOSIS — I10 Essential (primary) hypertension: Secondary | ICD-10-CM | POA: Insufficient documentation

## 2024-06-24 DIAGNOSIS — I872 Venous insufficiency (chronic) (peripheral): Secondary | ICD-10-CM | POA: Insufficient documentation

## 2024-06-24 DIAGNOSIS — R002 Palpitations: Secondary | ICD-10-CM | POA: Diagnosis not present

## 2024-06-24 DIAGNOSIS — E782 Mixed hyperlipidemia: Secondary | ICD-10-CM | POA: Diagnosis not present

## 2024-06-24 MED ORDER — BISOPROLOL FUMARATE 5 MG PO TABS: 5.0000 mg | ORAL_TABLET | Freq: Every day | ORAL | 1 refills | Status: DC | PRN

## 2024-06-24 NOTE — Progress Notes (Signed)
 "         Date:  06/24/2024   ID:  Nicole Gould, DOB 1961/04/29, MRN [id number]  Patient Location:  [address] [place] [address]   Provider location:   [facility], [place] office  PCP:  Cleotilde Planas, MD  Cardiologist:  Perla [facility]  Chief Complaint  Patient presents with   Medication Management    Brain fog and severe insomnia with blood pressure fluctuating.     History of Present Illness:    Nicole Gould is a 64 y.o. female past medical history of palpitations, hypertension  Hypoglycemia various medication intolerances calcium score on 07/03/2019 which was 0.  Chronic dizziness unrelated to blood pressure, negative Holter, Costochondritis who presents for follow-up of her Blood pressure, chest pain, preop evaluation  LOV  with myself 11/24  Reports having symptoms on Bystolic  Severe Insomnia, 6-12 mo of brain fog, H/A Fell out of chair, hit back of head, concussion Having issues with menopause taking Bystolic  5 twice daily with amlodipine  7.5 daily Minimal ankle swelling Currently not on hydrochlorothiazide   Taking amlodipine  5 in the morning, 2.5 in the evening  Reports she is not exercising Followed by the weight clinic, supposed to be following high-protein diet  Rare episodes of tightness in the chest, atypical in nature  Echocardiogram March 26, 2023 Normal LV and RV size and function,  moderately dilated left atrium Mild to moderate MR  Calcium score 2021, score 0  Zio monitor with no significant arrhythmia  EKG reviewed from November 2025, normal sinus rhythm, no significant ST-T wave changes  Went to the ER February 07, 2019 for chest pain, workup negative  At the time taking amlodipine  5mg , bystolic  5mg  and hydrochlorothiazide  12.5mg  daily.  Stopped [employer] Felt symptoms similar to costochondritis which she had in the past  Documented hypertension medication intolerances: Cardura  - dizziness Amlodipine   - lower extremity swelling; headache HCTZ - hypokalemia and dry mouth Losartan  - dizziness and shortness of breath; malaise Lisinopril - cough Benicar - lightheadedness and presyncope Spironolactone  - breast fullness Eplerenone  - breast tenderness Bystolic : brain fog  Total Chol 184/ LDL 114 CT coronary calcium score 0   Past Medical History:  Diagnosis Date   Allergy    Anxiety    Back pain    Chicken pox    Chronic Dizziness    Colon polyps    Concussion    Constipation    Frequent headaches    GERD (gastroesophageal reflux disease)    on meds   History of stress test    a. 2005 - in setting of ectopy - reportedly nl.   Hypertension    a. 03/2018 24hr ABM: Mean BP 146/75, mean HR 61; Mean awake BP/HR 150/78, 62; Mean sleep BP/HR 130/65, 58.   Lower extremity edema    Migraines    OSA (obstructive sleep apnea)    No Cpap at this time   Palpitations    Pre-diabetes    Pre-eclampsia    with grand mal seizures   Seasonal allergies    Seizure disorder in pregnancy (HCC)    Seizures (HCC)    with pregnancy 1981- pre clampsia    Sleep apnea    SOBOE (shortness of breath on exertion)    Swallowing difficulty    Vitamin D  deficiency    Past Surgical History:  Procedure Laterality Date   COLONOSCOPY  2018   Pyrtle-MAC-suprep(exc)-HPP   DILATION AND CURETTAGE OF UTERUS  09/08/2014   with hystereoscopy  POLYPECTOMY  2018   HPP   TONSILLECTOMY     TUBAL LIGATION      Current Outpatient Medications on File Prior to Visit  Medication Sig Dispense Refill   amLODipine  (NORVASC ) 2.5 MG tablet Take 1 tablet (2.5 mg total) by mouth 2 (two) times daily. May take an extra tablet (2.5 MG) daily as needed for systolic blood pressure greater then 140. 270 tablet 3   Apple Cider Vinegar 500 MG TABS Take 500 mg by mouth daily.     azelastine  (OPTIVAR ) 0.05 % ophthalmic solution Place 1 drop into both eyes 2 (two) times daily as needed (itchy/watery eyes). 6 mL 5    Azelastine -Fluticasone  137-50 MCG/ACT SUSP 1 spray twice a day as needed for runny or stuffy nose. 23 g 2   Bioflavonoid Products (VITAMIN C) CHEW Chew 1 tablet by mouth daily.     Blood Glucose Monitoring Suppl (ONETOUCH VERIO FLEX SYSTEM) w/Device KIT as directed.     BYSTOLIC  10 MG tablet Take 1 tablet (10 mg total) by mouth daily. 90 tablet 3   Cholecalciferol 50 MCG (2000 UT) TABS Take 1 tablet by mouth daily.     CINNAMON PO Take 1,000 mg by mouth daily.      glucose blood test strip See admin instructions.     Homeopathic Products (ZICAM COLD REMEDY PO) Take by mouth.     Lancets (ONETOUCH DELICA PLUS LANCET33G) MISC SMARTSIG:1 Topical Daily     Multiple Vitamins-Minerals (MULTIVITAMIN WITH MINERALS) tablet Take 1 tablet by mouth daily.     Multiple Vitamins-Minerals (OCUVITE EXTRA PO) Take by mouth daily.     Olopatadine-Mometasone (RYALTRIS ) 665-25 MCG/ACT SUSP Place 2 sprays into the nose in the morning and at bedtime. 29 g 5   Omega-3 Fatty Acids (FISH OIL) 300 MG CAPS Take 300 mg by mouth 3 (three) times daily.     OVER THE COUNTER MEDICATION daily. Nutriful- Hair Growth Vitamin     pantoprazole  (PROTONIX ) 40 MG tablet Take 1 tablet (40 mg total) by mouth daily. 90 tablet 3   Probiotic Product (PROBIOTIC-10 PO) Take 1 capsule by mouth daily.     sucralfate  (CARAFATE ) 1 g tablet Take 1 tablet (1 g total) by mouth 4 (four) times daily. 360 tablet 1   tretinoin (RETIN-A) 0.025 % cream Apply 1 Application topically at bedtime.     Turmeric (QC TUMERIC COMPLEX PO) Take by mouth.     VITAMIN D -VITAMIN K PO Take by mouth daily.     hyoscyamine  (LEVSIN  SL) 0.125 MG SL tablet Take 0.125 mg by mouth every 4 (four) hours as needed for cramping. (Patient not taking: Reported on 06/24/2024)     traZODone  (DESYREL ) 50 MG tablet Take 1 tablet (50 mg total) by mouth at bedtime. (Patient not taking: Reported on 06/24/2024) 30 tablet 3   No current facility-administered medications on file prior to  visit.    Allergies:   Losartan ; Anesthetics, amide; Benadryl [diphenhydramine hcl (sleep)]; Benicar [olmesartan]; Cardura  [doxazosin  mesylate]; Fentanyl; Hydrochlorothiazide ; Losartan  potassium-hctz; Metformin and related; Oseltamivir ; Other; Prilocaine; and Semaglutide   Social History   Tobacco Use   Smoking status: Never    Passive exposure: Past (Dad)   Smokeless tobacco: Never  Vaping Use   Vaping status: Never Used  Substance Use Topics   Alcohol use: Yes    Alcohol/week: 1.0 standard drink of alcohol    Types: 1 Glasses of wine per week    Comment: Rarely.   Drug use: No  Family Hx: The patient's family history includes Breast cancer (age of onset: 62 - 11) in her cousin; Cancer in her father; Colon cancer in her maternal grandfather; Diabetes in her father; Heart disease in her father; Hypertension in her father and mother; Kidney disease in her father. There is no history of Colon polyps, Esophageal cancer, Rectal cancer, or Stomach cancer.  ROS:   Please see the history of present illness.    Review of Systems  Constitutional: Negative.   HENT: Negative.    Respiratory: Negative.    Cardiovascular: Negative.   Gastrointestinal: Negative.   Musculoskeletal:  Positive for joint pain.       Shoulder pain  Neurological: Negative.   Psychiatric/Behavioral: Negative.    All other systems reviewed and are negative.   Labs/Other Tests and Data Reviewed:    Recent Labs: 04/30/2024: ALT 19; BUN 14; Creatinine, Ser 0.72; Hemoglobin 13.1; Magnesium 2.4; Platelets 163; Potassium 4.3; Sodium 141; TSH 1.360   Recent Lipid Panel Lab Results  Component Value Date/Time   CHOL 178 04/30/2024 09:45 AM   TRIG 53 04/30/2024 09:45 AM   HDL 56 04/30/2024 09:45 AM   CHOLHDL 3.1 08/23/2022 09:50 AM   CHOLHDL 3 08/27/2015 08:06 AM   LDLCALC 112 (H) 04/30/2024 09:45 AM   LDLDIRECT 113.0 09/16/2014 12:08 PM    Wt Readings from Last 3 Encounters:  06/24/24 193 lb (87.5 kg)   06/17/24 193 lb (87.5 kg)  05/23/24 194 lb (88 kg)     Exam:    Vital Signs: Vital signs may also be detailed in the HPI BP 130/72 (BP Location: Left Arm, Patient Position: Sitting, Cuff Size: Large)   Pulse (!) 57   Ht 5' 4 (1.626 m)   Wt 193 lb (87.5 kg)   LMP 12/01/2013   SpO2 97%   BMI 33.13 kg/m   Constitutional:  oriented to person, place, and time. No distress.  HENT:  Head: Grossly normal Eyes:  no discharge. No scleral icterus.  Neck: No JVD, no carotid bruits  Cardiovascular: Regular rate and rhythm, no murmurs appreciated Pulmonary/Chest: Clear to auscultation bilaterally, no wheezes or rails Abdominal: Soft.  no distension.  no tenderness.  Musculoskeletal: Normal range of motion Neurological:  normal muscle tone. Coordination normal. No atrophy Skin: Skin warm and dry Psychiatric: normal affect, pleasant  ASSESSMENT & PLAN:    Essential hypertension, benign Concerned about symptoms on Bystolic  Unclear if they are related or unrelate Without beta-blocker will likely have recurrence of her tachypalpitations Recommend she take bisoprolol  5 mg daily as needed for palpitations  Leg swelling Minimal swelling, tolerating amlodipine  7.5 daily  anxiety Managed by primary care  Dizziness Chronic issue, unrelated to hypertension, no arrhythmia on monitor  Chest pain Prior history of atypical symptoms, musculoskeletal pain, chest wall pain  Palpitations Holding Bystolic , take bisoprolol  as needed  Essential hypertension Blood pressure relatively well-controlled Holding Bystolic  out of concern for side effects Not on HCTZ secondary to side effects Continue amlodipine  7.5 daily May need to add bisoprolol  2.5 up to 5 daily for blood pressure and palpitations    Signed, Evalene Lunger, MD  06/24/2024 8:21 AM    [facility] [address], [place], [place] [address]     "

## 2024-06-24 NOTE — Patient Instructions (Addendum)
 Medication Instructions:   Please hold the bystolic   Take bisoprolol  5 mg daily as needed for palpitations  If you need a refill on your cardiac medications before your next appointment, please call your pharmacy.   Lab work: No new labs needed  Testing/Procedures: No new testing needed  Follow-Up: At [facility], you and your health needs are our priority.  As part of our continuing mission to provide you with exceptional heart care, we have created designated Provider Care Teams.  These Care Teams include your primary Cardiologist (physician) and Advanced Practice Providers (APPs -  Physician Assistants and Nurse Practitioners) who all work together to provide you with the care you need, when you need it.  You will need a follow up appointment in 12 months  Providers on your designated Care Team:   Lonni Meager, NP Bernardino Bring, PA-C Cadence [place], [place]  COVID-19 Vaccine Information can be found at: [email or web address] For questions related to vaccine distribution or appointments, please email [email or web address] or call [phone].

## 2024-06-26 NOTE — Telephone Encounter (Signed)
 Patient calling to f/u on this issue. She would like to know if she should take another type of bp med instead of beta blocker. Please advise.

## 2024-06-26 NOTE — Telephone Encounter (Signed)
 Patient seen in clinic 06/24/24 all questions and concerns addressed at that time.

## 2024-06-27 ENCOUNTER — Encounter: Payer: Self-pay | Admitting: Family Medicine

## 2024-06-27 ENCOUNTER — Telehealth: Payer: Self-pay | Admitting: Home Health

## 2024-06-27 MED ORDER — VALSARTAN 40 MG PO TABS: 40.0000 mg | ORAL_TABLET | Freq: Two times a day (BID) | ORAL | 0 refills | Status: DC

## 2024-06-27 NOTE — Telephone Encounter (Signed)
 Called patient back. She was seen in office 3 days ago and was taken off bystolic  for side effects and put on bisoprolol  PRN for palpitations. She has not experienced any flutters. However, blood pressure the past day and today has been elevated in high 150-160's systolic. She states she gets a headache when her blood pressure gets high and takes her BP and that's how she notices. She doubled up on amlodipine  doses yesterday and ended up taking 10 mg amlodipine  in total, she also took a bisoprolol  because she wanted to try anything to get her BP down. For past couple days as well she has been experiencing leg weakness and wobbly legs to the point where she feels like she may fall over, but she is not dizzy and her legs are not causing pain.   She is calling to inquire about a medication to control blood pressure better since being off bystolic , as she does not want to keep doubling up on amlodipine  if it is not doctors ordered. She has a funeral to attend this weekend and will be going out town tomorrow morning early and she wants to know if Dr Perla has any recommendations or wants to add anything to the patient's list for better blood pressure control. She states it makes her super anxious to know her blood pressure is high like this. She is also trying to lose weight to help with blood pressure as well.

## 2024-06-27 NOTE — Telephone Encounter (Signed)
 Called patient and left voicemail with direct line number to call back.

## 2024-06-27 NOTE — Telephone Encounter (Signed)
 Called patient and notified her of the following recommendations from Dr. Gollan.  Hard to say what to do with the blood pressure She has tried many medications in the past and she has had intolerances   As she is tolerating the amlodipine  would recommend she take 5 in the morning 5 in the evening We can send a new prescription She could try the bisoprolol  daily if she wants And see if that controls it   If she does not want bisoprolol  daily and prefers alternate medication, We could try valsartan  80 mg but take it 40 twice daily with the amlodipine  twice daily Thx TGollan  Patient was currently in PCP office for blood pressure. PCP is starting patient on Amlodipine  5 MG twice daily and Losartan . Patient verbalized appreciation for call back.

## 2024-06-27 NOTE — Telephone Encounter (Signed)
 Patient called after hour line, asking to review conversation she had with the nurse in the office for her BP meds. Lengthy conversation had regarding her historical BP management. She had large list of allergy/intolerance. Now she states she is taking amlodipine  5mg  BID as well as bisoprolol  5mg  for HTN, 150-160s systolic, she does not want to take bisoprolol  and states valsartan  was suggested. She has allergy documented to losartan  and olmesartan. She states she does not re-call and not sure if they were true allergies. Explained that she should follow up with Dr Gollan for her antihypertensive regimen to avoid multiple prescriber with various change of meds. She is very concerned about her BP. Suggested continue bisoprolol  as it will help her BP and palpitation. She said she may continue it and see if she can tolerate it. She also wanted valsartan  sent in case she can't tolerate bisoprolol . Valsartan  40mg  BID sent for 5 days supply. Advised communicate her needs with Dr Perla office.

## 2024-06-27 NOTE — Telephone Encounter (Signed)
 Pt was returning nurse call and is requesting a callback. Please advise.

## 2024-06-30 ENCOUNTER — Ambulatory Visit: Payer: Self-pay | Admitting: Sleep Medicine

## 2024-07-01 ENCOUNTER — Ambulatory Visit
Admission: RE | Admit: 2024-07-01 | Discharge: 2024-07-01 | Disposition: A | Source: Ambulatory Visit | Attending: Family Medicine | Admitting: Family Medicine

## 2024-07-01 DIAGNOSIS — R519 Headache, unspecified: Secondary | ICD-10-CM

## 2024-07-01 MED ORDER — GADOPICLENOL 0.5 MMOL/ML IV SOLN
8.5000 mL | Freq: Once | INTRAVENOUS | Status: AC | PRN
  Administered 2024-07-01: 8.5 mL via INTRAVENOUS

## 2024-07-04 MED ORDER — TRAZODONE HCL 50 MG PO TABS: 50.0000 mg | ORAL_TABLET | Freq: Every day | ORAL | 3 refills | Status: AC

## 2024-07-08 ENCOUNTER — Telehealth: Payer: Self-pay | Admitting: Cardiovascular Disease

## 2024-07-08 NOTE — Telephone Encounter (Signed)
" °  Pt c/o medication issue:  1. Name of Medication: bisoprolol  (ZEBETA ) 5 MG tablet   2. How are you currently taking this medication (dosage and times per day)? As written   3. Are you having a reaction (difficulty breathing--STAT)? No  4. What is your medication issue? Pt states she is having extreme hair loss and wants to know can she take another beta blocker that doesn't have that side effect.   Pt would also like a c/b regarding an overdose on bisoprolol  and how she took it last night please advise.  "

## 2024-07-08 NOTE — Telephone Encounter (Signed)
 Patient would like to know if medications can be changed due to hair loss, specifically bystolic  and bisoprolol . Her BP is controlled but she cannot handle the increased hair loss.   Patient took a second bisoprolol  last night on accident, BP this morning is 139/84  HR 64  Advised her to hold today's dose and then restart tomorrow.

## 2024-07-11 MED ORDER — BISOPROLOL FUMARATE 5 MG PO TABS: 5.0000 mg | ORAL_TABLET | Freq: Every day | ORAL | 1 refills | Status: DC | PRN

## 2024-07-11 NOTE — Telephone Encounter (Signed)
 Called and spoke with patient. Notified patient of the following from Dr. Gollan.  She can stop the bisoprolol  and take it only as needed for palpitations Typically we do not see much hair loss with bisoprolol  but okay to change I see a message sent in earlier that they were going to try valsartan  40 twice daily for blood pressure with her amlodipine  Lets try that Thx TGollan  Patient verbalizes understanding. Patient requesting a refill for Bisprolol. Prescription sent in per patient's request.

## 2024-07-15 ENCOUNTER — Telehealth: Payer: Self-pay | Admitting: Gastroenterology

## 2024-07-15 NOTE — Telephone Encounter (Signed)
 Patient requesting to know if she needs to keep follow up visit. States her symptoms have subsided. Requested to speak with nurse regarding keeping appt. Please advise, thank you

## 2024-07-15 NOTE — Telephone Encounter (Signed)
 Clarified valsartan  directions, per chart 1 tablet twice daily

## 2024-07-15 NOTE — Telephone Encounter (Signed)
 Pt called in and confused how she should be taking the Valsartan  ?  Best number [phone]

## 2024-07-15 NOTE — Telephone Encounter (Signed)
 Patient is advised that we will cancel her appointment with [facility] for 07/18/24 if she is feeling well. She should reschedule should symptoms recur otherwise will see her in 2028 for repeat colonoscopy. She verbalizes understanding

## 2024-07-16 ENCOUNTER — Ambulatory Visit (INDEPENDENT_AMBULATORY_CARE_PROVIDER_SITE_OTHER): Admitting: Family Medicine

## 2024-07-16 ENCOUNTER — Encounter (INDEPENDENT_AMBULATORY_CARE_PROVIDER_SITE_OTHER): Payer: Self-pay | Admitting: Family Medicine

## 2024-07-16 VITALS — BP 121/73 | HR 61 | Temp 97.6°F | Ht 64.0 in | Wt 193.0 lb

## 2024-07-16 DIAGNOSIS — R7303 Prediabetes: Secondary | ICD-10-CM | POA: Diagnosis not present

## 2024-07-16 DIAGNOSIS — E669 Obesity, unspecified: Secondary | ICD-10-CM | POA: Diagnosis not present

## 2024-07-16 DIAGNOSIS — Z6833 Body mass index (BMI) 33.0-33.9, adult: Secondary | ICD-10-CM

## 2024-07-16 DIAGNOSIS — I10 Essential (primary) hypertension: Secondary | ICD-10-CM

## 2024-07-16 NOTE — Progress Notes (Signed)
 "  Barnie DOROTHA Jenkins, D.O.  ABFM, ABOM Specializing in Clinical Bariatric Medicine  Office located at: [address] [place], [place]  [address]       FOR THE CHRONIC DISEASE OF OBESITY: Obesity, starting BMI 33.1 BMI 33.0-33.9,adult-current 33.11  Chief complaint: Obesity Nicole Gould is here to discuss her progress with her obesity treatment plan.   History of present illness / Interval history:  Nicole Gould is here today for her follow-up office visit.  Since last OV on 06/17/2024 with provider: Jonel Rockie BIRCH, NP, patient weight is unchanged.   Recent challenges/ barriers to successful adherence of program recommendations: Per pt she reports experiencing winter funk and does not feel motivated to adhere to MP or implement healthy lifestyle interventions. --> suboptimal adherence   Pt has been struggling with:  []  meal planning and prepping [x]  exercise []  sleep []  stressors [x]  Mood- reports she has been less motivated recently  []  chronic or acute medical conditions  []  eating out more []  Nothing, doing great  Pt has been working on and improving their:  []  meal planning and prepping []  exercise []  sleep hygiene []  water intake []  strategies to better manage personal stressors []  strategies to better manage mood []  eating out less [x]  focusing on getting her protein in []  Nothing particular at this time  When asked by CMA prior to our office visit today, pt states they have been:  - Focused on eating fresh, unprocessed foods?   - Focused on eating lean proteins with each meal?  No - Sleeping 7-9 Hours/ Night?  No - Skipping Meals?  No   - States she is following her healthy eating plan approximately no percent of the time but pt states concentrating on protein   Recent weight loss data history  06/17/24 07/16/24 14:00   Body Fat % 43.7 % 42.9 %  Muscle Mass (lbs) 103 lbs 104.8 lbs  Fat Mass (lbs) 84.4 lbs 83 lbs  Total Body Water (lbs) 76.4  lbs 74.2 lbs  Visceral Fat Rating  12 12   Counseling done on how various foods/ behaviors will affect these numbers and how to maximize weight loss success discussed today in detail based on these findings. Total lbs lost to date: +1 lbs Total Fat Mass in lbs lost to date: +1.6 lbs Total weight loss percentage to date since starting program:  +0.52 %    Physician directed Nutrition Therapy prescription: She is keeping a food journal and adhering to recommended goals of 1100-1200 calories and 85g+ protein.  Nicole Gould is currently in the action stage of change. As such, her goal is to continue weight management.   She has agreed to: switch to food journal and adhere to 1,000 calories and 75++ grams of protein.    Physician directed Behavioral Modification prescription: Evidence-based interventions for healthy behavior change were utilized today including the discussion of small changes and SMART goals.  We discussed the following today: increasing lean protein intake to established goals, work on meal planning and preparation, practice mindfulness eating and understand the difference between hunger signals and cravings, and continue to work on implementation of reduced calorie nutritional plan   Additional resources provided: Handout and personalized instruction on tracking and journaling using Apps and Handout on Daily Food Journaling log    Physician directed Physical Activity prescription: Pt is walking 15  minutes 2 days per week barriers to successful adherence to exercise for wt loss: lacks motivation to exercise; reports in  the winter she is in a funk   Nicole Gould has been educated on importance of exercise to increase caloric expenditure and create a calorie deficit for further weight loss and how exercise will help regulate blood sugar levels and prevent excess fat storage  Exercise prescription: She has agreed to Think about enjoyable ways to increase daily physical  activity and overcoming barriers to exercise, Increase physical activity in their day and reduce sedentary time (increase NEAT)., and Increase the intensity, frequency or duration of aerobic exercises  , Recommended AHOY Campbell    Medical Interventions/ Pharmacotherapy Previous Bariatric surgery: none  Previously tried medical weight loss medications/ therapies: GLP-1: Semaglutide (wegovy): experienced GI upset and Metformin which produced an out of body experience  Pharmacotherapy for weight loss: She is not currently taking medications  for medical weight loss.    We discussed various medication options to help Nicole Gould with her weight loss efforts and we both agreed to:  Continue with current nutritional and behavioral strategies and Was educated on available treatment options, including pharmacotherapy with emphasis on potential side effects and other available medical interventions Extensive discussion on several different anti-obesity medications. Educated pt risks and benefits of each medication:  Phetntermine would be contraindicated in her due to anxiety  Topiramate would make her have a worse out of body experience and side effects than Metformin She would experience side effects on GLP-1's similar to [facility].    SPECIFIC behavorial / nutritional / exercise goals for next office visit:  Record daily caloric and protein intake and next OV bring in food journaling log.  B) OBESITY RELATED CONDITIONS ADDRESSED TODAY:  Prediabetes Assessment & Plan Lab Results  Component Value Date   HGBA1C 5.8 (H) 04/30/2024   HGBA1C 5.9 (H) 08/23/2022   HGBA1C 5.6 06/24/2019   INSULIN  8.0 04/30/2024   INSULIN  9.8 09/25/2018  Pre-DM condition is improving, but not optimized. Followed by Dr. Tommas of endocrinology. Currently managed by dietary and lifestyle interventions. Hunger and cravings are well controlled. Reports she has been monitoring her blood sugars at home and they have been  variable. Reviewed normal blood sugar levels with pt. Encouraged to adhere to MP and decrease simple carbs and increase lean protein. Increase exercise as able. Cont to F/u with endo     Primary hypertension Assessment & Plan Last 3 blood pressure readings in our office are as follows: BP Readings from Last 3 Encounters:  07/16/24 121/73  06/24/24 130/72  06/17/24 116/68   The 10-year ASCVD risk score (Arnett DK, et al., 2019) is: 7.2%  Lab Results  Component Value Date   CREATININE 0.72 04/30/2024  Currently on Norvasc  daily and bisoprolol  prn. BP is well controlled today. No acute concerns. Cont  low-sodium, heart-healthy diet. Cont adequate hydration. Cont meds per PCP. F/u with PCP as needed.       Follow up:   Return 10/13/2024 2:00 PM [place] Adelita PENNER, MD.   She was informed of the importance of frequent follow up visits to maximize her success with intensive lifestyle modifications for her multiple health conditions.   Weight Summary and Biometrics   Weight Lost Since Last Visit: 0  Weight Gained Since Last Visit: 0lb    Vitals Temp: 97.6 F (36.4 C) BP: 121/73 Pulse Rate: 61 SpO2: 97 %   Anthropometric Measurements Height: 5' 4 (1.626 m) Weight: 193 lb (87.5 kg) BMI (Calculated): 33.11 Weight at Last Visit: 0 Weight Lost Since Last Visit: 0 Weight Gained Since Last Visit: 0lb Starting  Weight: 192lb Total Weight Loss (lbs): 0 lb (0 kg) Peak Weight: 197lb   Body Composition  Body Fat %: 42.9 % Fat Mass (lbs): 83 lbs Muscle Mass (lbs): 104.8 lbs Total Body Water (lbs): 74.2 lbs Visceral Fat Rating : 12   Other Clinical Data Fasting: no Labs: no Today's Visit #: 4 Starting Date: 04/30/24    Objective:   PHYSICAL EXAM: Blood pressure 121/73, pulse 61, temperature 97.6 F (36.4 C), height 5' 4 (1.626 m), weight 193 lb (87.5 kg), last menstrual period 12/01/2013, SpO2 97%. Body mass index is 33.13 kg/m. General: she is overweight,  cooperative and in no acute distress. PSYCH: Has normal mood, affect and thought process.   HEENT: EOMI, sclerae are anicteric. Lungs: Normal breathing effort, no conversational dyspnea. Extremities: Moves * 4 Neurologic: A and O * 3, good insight  DIAGNOSTIC DATA REVIEWED: BMET    Component Value Date/Time   NA 141 04/30/2024 0945   NA 143 09/08/2014 2059   K 4.3 04/30/2024 0945   K 3.8 09/08/2014 2059   CL 104 04/30/2024 0945   CL 108 09/08/2014 2059   CO2 25 04/30/2024 0945   CO2 26 09/08/2014 2059   GLUCOSE 86 04/30/2024 0945   GLUCOSE 97 02/12/2023 1653   GLUCOSE 94 09/08/2014 2059   BUN 14 04/30/2024 0945   BUN 14 09/08/2014 2059   CREATININE 0.72 04/30/2024 0945   CREATININE 0.72 09/08/2014 2059   CALCIUM 9.4 04/30/2024 0945   CALCIUM 9.4 09/08/2014 2059   GFRNONAA >60 02/12/2023 1653   GFRNONAA >60 09/08/2014 2059   GFRAA >60 08/26/2019 1542   GFRAA >60 09/08/2014 2059   Lab Results  Component Value Date   HGBA1C 5.8 (H) 04/30/2024   HGBA1C 5.5 08/27/2015   Lab Results  Component Value Date   INSULIN  8.0 04/30/2024   INSULIN  9.8 09/25/2018   Lab Results  Component Value Date   TSH 1.360 04/30/2024   CBC    Component Value Date/Time   WBC 2.7 (L) 04/30/2024 0945   WBC 3.2 (L) 02/07/2023 1401   RBC 4.36 04/30/2024 0945   RBC 4.49 02/07/2023 1401   HGB 13.1 04/30/2024 0945   HCT 40.6 04/30/2024 0945   PLT 163 04/30/2024 0945   MCV 93 04/30/2024 0945   MCV 92 09/08/2014 2059   MCH 30.0 04/30/2024 0945   MCH 29.4 02/07/2023 1401   MCHC 32.3 04/30/2024 0945   MCHC 32.4 02/07/2023 1401   RDW 13.4 04/30/2024 0945   RDW 14.3 09/08/2014 2059   Iron Studies    Component Value Date/Time   IRON 40 05/22/2024 1559   TIBC 349 05/22/2024 1559   FERRITIN 138 05/22/2024 1559   IRONPCTSAT 12 05/22/2024 1559   Lipid Panel     Component Value Date/Time   CHOL 178 04/30/2024 0945   TRIG 53 04/30/2024 0945   HDL 56 04/30/2024 0945   CHOLHDL 3.1  08/23/2022 0950   CHOLHDL 3 08/27/2015 0806   VLDL 11.0 08/27/2015 0806   LDLCALC 112 (H) 04/30/2024 0945   LDLDIRECT 113.0 09/16/2014 1208   Hepatic Function Panel     Component Value Date/Time   PROT 7.2 04/30/2024 0945   PROT 7.1 09/08/2014 2059   ALBUMIN 4.5 04/30/2024 0945   ALBUMIN 4.1 09/08/2014 2059   AST 23 04/30/2024 0945   AST 34 09/08/2014 2059   ALT 19 04/30/2024 0945   ALT 45 09/08/2014 2059   ALKPHOS 51 04/30/2024 0945   ALKPHOS 43 09/08/2014 2059  BILITOT 0.7 04/30/2024 0945   BILITOT 0.5 09/08/2014 2059   BILIDIR 0.2 02/07/2023 1401   IBILI 0.5 02/07/2023 1401      Component Value Date/Time   TSH 1.360 04/30/2024 0945   Nutritional Lab Results  Component Value Date   VD25OH 60.9 04/30/2024   VD25OH 40.3 06/24/2019   VD25OH 36.5 04/19/2018    Attestations:   I, Feliciano Mingle, acting as a stage manager for Barnie Jenkins, DO., have compiled all relevant documentation for today's office visit on behalf of Barnie Jenkins, DO, while in the presence of [employer], DO.  I have spent 38 minutes in the care of the patient including:   -   3 minutes before the visit reviewing and preparing the chart.   -   28 minutes face-to-face assessing and reviewing listed medical problems as outlined in obesity care plan, providing nutritional and behavioral counseling on topics outlined in the obesity care plan, independently interpreting test results and goals of care as described in assessment and plan, and reviewing and discussing biometric information and progress with obesity treatment plan  -   7 minutes after the visit regarding the EMR documentation of encounter.   I have reviewed the above documentation for accuracy and completeness, and I agree with the above. Barnie JINNY Jenkins, D.O.  The 21st Century Cures Act was signed into law in 2016 which includes the topic of electronic health records.  This provides immediate access to information in MyChart.   This includes consultation notes, operative notes, office notes, lab results and pathology reports.  If you have any questions about what you read please let us  know at your next visit so we can discuss your concerns and take corrective action if need be.  We are right here with you.  "

## 2024-07-18 ENCOUNTER — Ambulatory Visit: Admitting: Gastroenterology

## 2024-07-22 ENCOUNTER — Ambulatory Visit: Admitting: Cardiovascular Disease

## 2024-07-22 VITALS — BP 118/62 | HR 62 | Ht 64.0 in | Wt 193.0 lb

## 2024-07-22 DIAGNOSIS — I10 Essential (primary) hypertension: Secondary | ICD-10-CM

## 2024-07-22 DIAGNOSIS — E782 Mixed hyperlipidemia: Secondary | ICD-10-CM | POA: Diagnosis not present

## 2024-07-22 MED ORDER — VALSARTAN 40 MG PO TABS: 40.0000 mg | ORAL_TABLET | Freq: Every day | ORAL | 3 refills | Status: AC

## 2024-07-22 MED ORDER — DILTIAZEM HCL 30 MG PO TABS: 30.0000 mg | ORAL_TABLET | Freq: Two times a day (BID) | ORAL | 1 refills | Status: AC | PRN

## 2024-07-23 ENCOUNTER — Other Ambulatory Visit (INDEPENDENT_AMBULATORY_CARE_PROVIDER_SITE_OTHER): Payer: Self-pay | Admitting: Family Medicine

## 2024-07-23 DIAGNOSIS — R252 Cramp and spasm: Secondary | ICD-10-CM

## 2024-07-25 ENCOUNTER — Telehealth: Payer: Self-pay | Admitting: Cardiovascular Disease

## 2024-07-25 NOTE — Telephone Encounter (Signed)
 Pt called in asking to speak with nurse only about her medications.

## 2024-07-25 NOTE — Telephone Encounter (Signed)
 Computer time out,   Do you want to adjust her dosage of amlodipine ? Or other medication? Please advise

## 2024-07-25 NOTE — Telephone Encounter (Signed)
 Call patient - stated she woke up last night with HR 114, advised that she should take the cardizem  when she feels palpitations, patient is taking amlodipine  5 mg in the am, 2.5 mg in the evening and an extra 2.5 mg at bedtime, advised that she only take 40 mg valsartan  daily

## 2024-08-21 ENCOUNTER — Ambulatory Visit: Admitting: Sleep Medicine

## 2024-10-13 ENCOUNTER — Ambulatory Visit (INDEPENDENT_AMBULATORY_CARE_PROVIDER_SITE_OTHER): Admitting: Family Medicine
